# Patient Record
Sex: Male | Born: 1950
Health system: Southern US, Community
[De-identification: ages and names within clinical notes are randomized; demographics above are authoritative.]

## PROBLEM LIST (undated history)

## (undated) DIAGNOSIS — K5790 Diverticulosis of intestine, part unspecified, without perforation or abscess without bleeding: Secondary | ICD-10-CM

## (undated) DIAGNOSIS — N400 Enlarged prostate without lower urinary tract symptoms: Secondary | ICD-10-CM

## (undated) DIAGNOSIS — F419 Anxiety disorder, unspecified: Secondary | ICD-10-CM

## (undated) DIAGNOSIS — E785 Hyperlipidemia, unspecified: Secondary | ICD-10-CM

## (undated) DIAGNOSIS — I4510 Unspecified right bundle-branch block: Secondary | ICD-10-CM

## (undated) DIAGNOSIS — K429 Umbilical hernia without obstruction or gangrene: Secondary | ICD-10-CM

## (undated) DIAGNOSIS — Z789 Other specified health status: Secondary | ICD-10-CM

## (undated) DIAGNOSIS — N433 Hydrocele, unspecified: Secondary | ICD-10-CM

## (undated) DIAGNOSIS — I5020 Unspecified systolic (congestive) heart failure: Secondary | ICD-10-CM

## (undated) DIAGNOSIS — R918 Other nonspecific abnormal finding of lung field: Secondary | ICD-10-CM

## (undated) DIAGNOSIS — I700 Atherosclerosis of aorta: Secondary | ICD-10-CM

## (undated) DIAGNOSIS — I513 Intracardiac thrombosis, not elsewhere classified: Secondary | ICD-10-CM

## (undated) DIAGNOSIS — Z8619 Personal history of other infectious and parasitic diseases: Secondary | ICD-10-CM

## (undated) DIAGNOSIS — I2510 Atherosclerotic heart disease of native coronary artery without angina pectoris: Secondary | ICD-10-CM

## (undated) DIAGNOSIS — M1990 Unspecified osteoarthritis, unspecified site: Secondary | ICD-10-CM

## (undated) DIAGNOSIS — J439 Emphysema, unspecified: Secondary | ICD-10-CM

## (undated) DIAGNOSIS — I219 Acute myocardial infarction, unspecified: Secondary | ICD-10-CM

## (undated) DIAGNOSIS — I255 Ischemic cardiomyopathy: Secondary | ICD-10-CM

## (undated) DIAGNOSIS — I779 Disorder of arteries and arterioles, unspecified: Secondary | ICD-10-CM

## (undated) DIAGNOSIS — H9190 Unspecified hearing loss, unspecified ear: Secondary | ICD-10-CM

## (undated) DIAGNOSIS — I10 Essential (primary) hypertension: Secondary | ICD-10-CM

## (undated) DIAGNOSIS — D126 Benign neoplasm of colon, unspecified: Secondary | ICD-10-CM

## (undated) DIAGNOSIS — R0789 Other chest pain: Secondary | ICD-10-CM

## (undated) DIAGNOSIS — M87052 Idiopathic aseptic necrosis of left femur: Secondary | ICD-10-CM

## (undated) DIAGNOSIS — K4090 Unilateral inguinal hernia, without obstruction or gangrene, not specified as recurrent: Secondary | ICD-10-CM

## (undated) DIAGNOSIS — N529 Male erectile dysfunction, unspecified: Secondary | ICD-10-CM

## (undated) DIAGNOSIS — Z7901 Long term (current) use of anticoagulants: Secondary | ICD-10-CM

## (undated) DIAGNOSIS — F1290 Cannabis use, unspecified, uncomplicated: Secondary | ICD-10-CM

## (undated) DIAGNOSIS — K219 Gastro-esophageal reflux disease without esophagitis: Secondary | ICD-10-CM

## (undated) DIAGNOSIS — K635 Polyp of colon: Secondary | ICD-10-CM

## (undated) DIAGNOSIS — I509 Heart failure, unspecified: Secondary | ICD-10-CM

## (undated) HISTORY — DX: Atherosclerotic heart disease of native coronary artery without angina pectoris: I25.10

## (undated) HISTORY — DX: Heart failure, unspecified: I50.9

## (undated) HISTORY — DX: Gastro-esophageal reflux disease without esophagitis: K21.9

## (undated) HISTORY — DX: Hyperlipidemia, unspecified: E78.5

## (undated) HISTORY — PX: SPLENECTOMY: SUR1306

## (undated) HISTORY — DX: Ischemic cardiomyopathy: I25.5

## (undated) HISTORY — DX: Intracardiac thrombosis, not elsewhere classified: I51.3

## (undated) HISTORY — DX: Unspecified systolic (congestive) heart failure: I50.20

## (undated) HISTORY — PX: CARDIAC CATHETERIZATION: SHX172

## (undated) HISTORY — PX: HYDROCELE EXCISION: SHX482

---

## 2003-02-02 HISTORY — PX: HERNIA REPAIR: SHX51

## 2003-05-03 ENCOUNTER — Other Ambulatory Visit: Payer: Self-pay

## 2005-10-29 DIAGNOSIS — I10 Essential (primary) hypertension: Secondary | ICD-10-CM

## 2006-08-10 ENCOUNTER — Ambulatory Visit: Payer: Self-pay | Admitting: Gastroenterology

## 2006-08-10 LAB — HM COLONOSCOPY

## 2008-07-30 DIAGNOSIS — L570 Actinic keratosis: Secondary | ICD-10-CM

## 2009-08-19 ENCOUNTER — Ambulatory Visit: Payer: Self-pay | Admitting: Family Medicine

## 2009-08-26 DIAGNOSIS — M5412 Radiculopathy, cervical region: Secondary | ICD-10-CM | POA: Insufficient documentation

## 2009-09-08 ENCOUNTER — Ambulatory Visit: Payer: Self-pay | Admitting: Family Medicine

## 2012-11-23 LAB — HM HEPATITIS C SCREENING LAB: HM Hepatitis Screen: NEGATIVE

## 2014-07-30 ENCOUNTER — Other Ambulatory Visit: Payer: Self-pay

## 2014-07-30 ENCOUNTER — Encounter: Payer: Self-pay | Admitting: Family Medicine

## 2014-07-30 ENCOUNTER — Ambulatory Visit (INDEPENDENT_AMBULATORY_CARE_PROVIDER_SITE_OTHER): Payer: Commercial Managed Care - PPO | Admitting: Family Medicine

## 2014-07-30 VITALS — BP 132/82 | HR 90 | Temp 98.1°F | Resp 16 | Wt 223.2 lb

## 2014-07-30 DIAGNOSIS — E669 Obesity, unspecified: Secondary | ICD-10-CM | POA: Insufficient documentation

## 2014-07-30 DIAGNOSIS — Z658 Other specified problems related to psychosocial circumstances: Secondary | ICD-10-CM

## 2014-07-30 DIAGNOSIS — Z87891 Personal history of nicotine dependence: Secondary | ICD-10-CM | POA: Insufficient documentation

## 2014-07-30 DIAGNOSIS — M47812 Spondylosis without myelopathy or radiculopathy, cervical region: Secondary | ICD-10-CM | POA: Insufficient documentation

## 2014-07-30 DIAGNOSIS — F419 Anxiety disorder, unspecified: Secondary | ICD-10-CM | POA: Insufficient documentation

## 2014-07-30 DIAGNOSIS — R3911 Hesitancy of micturition: Secondary | ICD-10-CM | POA: Insufficient documentation

## 2014-07-30 DIAGNOSIS — N529 Male erectile dysfunction, unspecified: Secondary | ICD-10-CM | POA: Insufficient documentation

## 2014-07-30 DIAGNOSIS — F439 Reaction to severe stress, unspecified: Secondary | ICD-10-CM

## 2014-07-30 MED ORDER — LORAZEPAM 0.5 MG PO TABS: 0.5000 mg | ORAL_TABLET | Freq: Three times a day (TID) | ORAL | Status: DC | PRN

## 2014-07-30 NOTE — Progress Notes (Signed)
Subjective:     Patient ID: Edward [name], male   DOB: 1950/04/26, 64 y.o.   MRN: [id number]  HPI  Chief Complaint  Patient presents with  . Hypertension    patient states that he noticed over weekend elevation in his blood pressure. He states that he has been under tremendous stress with his mother and finances at home. Patient states that when he checked his blood pressure he was out with his mother at pharmacy b.p read 168/80s and when checked for 3rd time he reports b.p 200/100. Patient reports that his head feels funny and that his skin is more sensitive on both his arms. He had concerns of sign of stroke  Reports compliance with metoprolol. Stress triggers have included dealing with his mother and her illness, house repairs, and debt. Has been taking lorazepam only on an occasional basis. Will also smoke marijuana occasionally.   Review of Systems  Respiratory: Negative for chest tightness and shortness of breath.        Objective:   Physical Exam  Constitutional: He appears well-developed and well-nourished. Distressed: moderate anxiety.  Neck: Carotid bruit is not present.  Cardiovascular: Normal rate and regular rhythm.   Pulmonary/Chest: Breath sounds normal.       Assessment:   1Situational stress - LORazepam (ATIVAN) 0.5 MG tablet; Take 1 tablet (0.5 mg total) by mouth 3 (three) times daily as needed for anxiety.  Dispense: 60 tablet; Refill: 1    Plan:    F/u with primary provider in one week.

## 2014-07-30 NOTE — Patient Instructions (Signed)
Please schedule lorazepam every 8 hours as needed for anxiety. Would suggest you take it at least twice daily during this stressful period for you.

## 2014-08-08 ENCOUNTER — Ambulatory Visit (INDEPENDENT_AMBULATORY_CARE_PROVIDER_SITE_OTHER): Payer: Commercial Managed Care - PPO | Admitting: Family Medicine

## 2014-08-08 VITALS — BP 136/82 | HR 64 | Temp 98.5°F | Resp 16 | Wt 230.0 lb

## 2014-08-08 DIAGNOSIS — I10 Essential (primary) hypertension: Secondary | ICD-10-CM

## 2014-08-08 DIAGNOSIS — F419 Anxiety disorder, unspecified: Secondary | ICD-10-CM

## 2014-08-08 DIAGNOSIS — R3911 Hesitancy of micturition: Secondary | ICD-10-CM | POA: Diagnosis not present

## 2014-08-08 DIAGNOSIS — N401 Enlarged prostate with lower urinary tract symptoms: Secondary | ICD-10-CM | POA: Diagnosis not present

## 2014-08-08 MED ORDER — TAMSULOSIN HCL 0.4 MG PO CAPS: 0.4000 mg | ORAL_CAPSULE | Freq: Every day | ORAL | Status: DC

## 2014-08-08 NOTE — Progress Notes (Signed)
Subjective:    Patient ID: Edward [name], male    DOB: 1950-09-02, 64 y.o.   MRN: [id number] Chief Complaint  Patient presents with  . Follow-up    HPI This 64 year old male present for follow up of hypertension and situational stress causing anxiety. Situation with mother and possible memory/dementia issues is calming down. Patient has decided to take little vacations from this situation by letting her go stay with family for a week. Using Lorazepam 0.5 mg BID with Metoprolol 50 mg BID. Sleeping only 5 hours a night but catch naps in the recliner each evening. Wife has felt he has a lot of snoring and possibly sleep apnea couple years ago, but not recently. Patient Active Problem List   Diagnosis Date Noted  . Anxiety 07/30/2014  . Arthritis of neck 07/30/2014  . ED (erectile dysfunction) of organic origin 07/30/2014  . Personal history of tobacco use, presenting hazards to health 07/30/2014  . Adiposity 07/30/2014  . Delayed onset of urination 07/30/2014  . Brachial neuritis 08/26/2009  . Actinic keratoses 07/30/2008  . Essential (primary) hypertension 10/29/2005   History  Substance Use Topics  . Smoking status: Former Smoker    Start date: 07/03/2007  . Smokeless tobacco: Not on file     Comment: QUIT IN 2005  . Alcohol Use: 0.0 oz/week    0 Standard drinks or equivalent per week     Comment: OCCASIONALLY   Family History  Problem Relation Age of Onset  . Emphysema Mother   . Macular degeneration Mother   . Heart attack Mother   . Heart attack Father    Past Surgical History  Procedure Laterality Date  . Hernia repair  2005  . Hydrocele excision     Current Outpatient Prescriptions on File Prior to Visit  Medication Sig Dispense Refill  . Aspirin Buf,AlHyd-MgHyd-CaCar, (ASCRIPTIN) 325 MG TABS Take 1 tablet by mouth daily.    Marland Kitchen LORazepam (ATIVAN) 0.5 MG tablet Take 1 tablet (0.5 mg total) by mouth 3 (three) times daily as needed for anxiety. 60 tablet 1  .  metoprolol (LOPRESSOR) 50 MG tablet Take 1 tablet by mouth 2 (two) times daily.     No current facility-administered medications on file prior to visit.   No Known Allergies   Review of Systems  Constitutional:       Weight gain with over eating late at night and 4-6 beers daily.  HENT: Negative.   Cardiovascular: Negative.   Gastrointestinal:       Occasional dyspepsia - controlled by infrequent Famotidine  Genitourinary: Positive for frequency.       Urinary hesitancy  Psychiatric/Behavioral: Positive for agitation. Negative for suicidal ideas.       Intermittent, but frequent stress causing anxiety.      Wt Readings from Last 3 Encounters:  08/08/14 230 lb (104.327 kg)  07/30/14 223 lb 3.2 oz (101.243 kg)   Temp Readings from Last 3 Encounters:  08/08/14 98.5 F (36.9 C) Oral  07/30/14 98.1 F (36.7 C) Oral   BP Readings from Last 3 Encounters:  08/08/14 136/82  07/30/14 132/82   Pulse Readings from Last 3 Encounters:  08/08/14 64  07/30/14 90    Objective:   Physical Exam  Constitutional: He is oriented to person, place, and time. He appears well-developed and well-nourished. No distress.  HENT:  Head: Normocephalic and atraumatic.  Right Ear: Hearing normal.  Left Ear: Hearing normal.  Nose: Nose normal.  Eyes: Conjunctivae and  lids are normal. Right eye exhibits no discharge. Left eye exhibits no discharge. No scleral icterus.  Neck: Normal range of motion. Neck supple.  Cardiovascular: Normal rate and regular rhythm.   Pulmonary/Chest: Effort normal. No respiratory distress.  Musculoskeletal: Normal range of motion.  Neurological: He is alert and oriented to person, place, and time.  Skin: Skin is intact. No lesion and no rash noted.  Psychiatric: He has a normal mood and affect. His speech is normal and behavior is normal. Thought content normal.      Assessment & Plan:  1. Anxiety Improved with use to Lorazepam 0.5 mg BID prn. To side effects or  hangover sensation in the morning after taking it. Recommend exercise and weight loss. Taking a break from caring for mother should help decrease anxiety level. Recheck in 3 months.  2. Essential (primary) hypertension Well controlled with Metoprolol 50 mg BID. Tolerating well without side effects. Some history of snoring and wife being concerned about possible sleep apnea. Patient denies the condition and not interested in sleep study yet. Advised an ambulatory/home sleep study is now available for diagnosis. Recheck BP in 3 months.  3. Urinary hesitancy due to benign prostatic hypertrophy Having some frequency without dysuria or hematuria. Has a history of suspected BPH that the urologist treated with Flomax in the past. Would like to try it again as he felt it worked well previously. Recheck in 3 months. - tamsulosin (FLOMAX) 0.4 MG CAPS capsule; Take 1 capsule (0.4 mg total) by mouth daily.  Dispense: 30 capsule; Refill: 3

## 2014-09-20 ENCOUNTER — Telehealth: Payer: Self-pay | Admitting: Family Medicine

## 2014-09-20 NOTE — Telephone Encounter (Signed)
Pt received a denial from insurance company 07/28/14.  Pt wonders if it is the way it was coded.  He came in for high blood pressure, but ended up talking about anxiety too.  His insurance usually pays.  Can we check into this and call him back.

## 2014-10-10 ENCOUNTER — Other Ambulatory Visit: Payer: Self-pay | Admitting: Family Medicine

## 2014-10-10 NOTE — Telephone Encounter (Signed)
See request for refill

## 2014-10-14 NOTE — Telephone Encounter (Signed)
Pt contacted office for refill request on the following medications: LORazepam (ATIVAN) 0.5 MG tablet to [facility] in [place]. Pt is out of this medication and would like it sent in today if possible because he has been out of the medication. Thanks TNP

## 2014-10-15 ENCOUNTER — Other Ambulatory Visit: Payer: Self-pay | Admitting: Family Medicine

## 2014-10-17 ENCOUNTER — Other Ambulatory Visit: Payer: Self-pay | Admitting: Family Medicine

## 2014-10-18 ENCOUNTER — Other Ambulatory Visit: Payer: Self-pay | Admitting: Family Medicine

## 2014-10-25 ENCOUNTER — Other Ambulatory Visit: Payer: Self-pay | Admitting: Family Medicine

## 2014-11-20 ENCOUNTER — Other Ambulatory Visit: Payer: Self-pay | Admitting: Family Medicine

## 2014-12-10 ENCOUNTER — Ambulatory Visit (INDEPENDENT_AMBULATORY_CARE_PROVIDER_SITE_OTHER): Payer: Commercial Managed Care - PPO | Admitting: Family Medicine

## 2014-12-10 ENCOUNTER — Encounter: Payer: Self-pay | Admitting: Family Medicine

## 2014-12-10 VITALS — BP 136/78 | HR 55 | Temp 98.3°F | Resp 16 | Ht 70.25 in | Wt 211.8 lb

## 2014-12-10 DIAGNOSIS — I10 Essential (primary) hypertension: Secondary | ICD-10-CM

## 2014-12-10 DIAGNOSIS — F419 Anxiety disorder, unspecified: Secondary | ICD-10-CM

## 2014-12-10 DIAGNOSIS — Z0000 Encounter for general adult medical examination without abnormal findings: Secondary | ICD-10-CM

## 2014-12-10 LAB — POCT URINALYSIS DIPSTICK
BILIRUBIN UA: NEGATIVE
Glucose, UA: NEGATIVE
KETONES UA: NEGATIVE
Leukocytes, UA: NEGATIVE
NITRITE UA: NEGATIVE
PH UA: 6
PROTEIN UA: NEGATIVE
RBC UA: NEGATIVE
Spec Grav, UA: 1.02
Urobilinogen, UA: 0.2

## 2014-12-10 NOTE — Patient Instructions (Signed)
Generalized Anxiety Disorder Generalized anxiety disorder (GAD) is a mental disorder. It interferes with life functions, including relationships, work, and school. GAD is different from normal anxiety, which everyone experiences at some point in their lives in response to specific life events and activities. Normal anxiety actually helps [place] prepare for and get through these life events and activities. Normal anxiety goes away after the event or activity is over.  GAD causes anxiety that is not necessarily related to specific events or activities. It also causes excess anxiety in proportion to specific events or activities. The anxiety associated with GAD is also difficult to control. GAD can vary from mild to severe. People with severe GAD can have intense waves of anxiety with physical symptoms (panic attacks).  SYMPTOMS The anxiety and worry associated with GAD are difficult to control. This anxiety and worry are related to many life events and activities and also occur more days than not for 6 months or longer. People with GAD also have three or more of the following symptoms (one or more in children):  Restlessness.   Fatigue.  Difficulty concentrating.   Irritability.  Muscle tension.  Difficulty sleeping or unsatisfying sleep. DIAGNOSIS GAD is diagnosed through an assessment by your health care provider. Your health care provider will ask you questions aboutyour mood,physical symptoms, and events in your life. Your health care provider may ask you about your medical history and use of alcohol or drugs, including prescription medicines. Your health care provider may also do a physical exam and blood tests. Certain medical conditions and the use of certain substances can cause symptoms similar to those associated with GAD. Your health care provider may refer you to a mental health specialist for further evaluation. TREATMENT The following therapies are usually used to treat GAD:    Medication. Antidepressant medication usually is prescribed for long-term daily control. Antianxiety medicines may be added in severe cases, especially when panic attacks occur.   Talk therapy (psychotherapy). Certain types of talk therapy can be helpful in treating GAD by providing support, education, and guidance. A form of talk therapy called cognitive behavioral therapy can teach you healthy ways to think about and react to daily life events and activities.  Stress managementtechniques. These include yoga, meditation, and exercise and can be very helpful when they are practiced regularly. A mental health specialist can help determine which treatment is best for you. Some people see improvement with one therapy. However, other people require a combination of therapies.   This information is not intended to replace advice given to you by your health care provider. Make sure you discuss any questions you have with your health care provider.   Document Released: 05/15/2012 Document Revised: 02/08/2014 Document Reviewed: 05/15/2012 Elsevier Interactive Patient Education 2016 [place] Anxiety Disorder Social anxiety disorder, previously called social phobia, is a mental disorder. People with social anxiety disorder frequently feel nervous, afraid, or embarrassed when around other people in social situations. They constantly worry that other people are judging or criticizing them for how they look, what they say, or how they act. They may worry that other people might reject them because of their appearance or behavior. Social anxiety disorder is more than just occasional shyness or self-consciousness. It can cause severe emotional distress. It can interfere with daily life activities. Social anxiety disorder also may lead to excessive alcohol or drug use and even suicide.  Social anxiety disorder is actually one of the most common mental disorders. It can develop at  any time but  usually starts in the teenage years. Women are more commonly affected than men. Social anxiety disorder is also more common in people who have family members with anxiety disorders. It also is more common in people who have physical deformities or conditions with characteristics that are obvious to others, such as stuttered speech or movement abnormalities (Parkinson disease).  SYMPTOMS  In addition to feeling anxious or fearful in social situations, people with social anxiety disorder frequently have physical symptoms. Examples include:  Red face (blushing).  Racing heart.  Sweating.  Shaky hands or voice.  Confusion.  Light-headedness.  Upset stomach and diarrhea. DIAGNOSIS  Social anxiety disorder is diagnosed through an assessment by your health care provider. Your health care provider will ask you questions about your mood, thoughts, and reactions in social situations. Your health care provider may ask you about your medical history and use of alcohol or drugs, including prescription medicines. Certain medical conditions and the use of certain substances, including caffeine, can cause symptoms similar to social anxiety disorder. Your health care provider may refer you to a mental health specialist for further evaluation or treatment. The criteria for diagnosis of social anxiety disorder are:  Marked fear or anxiety in one or more social situations in which you may be closely watched or studied by others. Examples of such situations include:  Interacting socially (having a conversation with others, going to a party, or meeting strangers).  Being observed (eating or drinking in public or being called on in class).  Performing in front of others (giving a speech).  The social situations of concern almost always cause fear or anxiety, not just occasionally.  People with social anxiety disorder fear that they will be viewed negatively in a way that will be embarrassing, will lead to  rejection, or will offend others. This fear is out of proportion to the actual threat posed by the social situation.  Often the triggering social situations are avoided, or they are endured with intense fear or anxiety. The fear, anxiety, or avoidance is persistent and lasts for 6 months or longer.  The anxiety causes difficulty functioning in at least some parts of your daily life. TREATMENT  Several types of treatment are available for social anxiety disorder. These treatments are often used in combination and include:   Talk therapy. Group talk therapy allows you to see that you are not alone with these problems. Individual talk therapy helps you address your specific anxiety issues with a caring professional. The most effective forms of talk therapy for social anxiety disorder are cognitive-behavioral therapy and exposure therapy. Cognitive-behavioral therapy helps you to identify and change negative thoughts and beliefs that are at the root of the disorder. Exposure therapy allows you to gradually face the situations that you fear most.  Relaxation and coping techniques. These include deep breathing, self-talk, meditation, visual imagery, and yoga. Relaxation techniques help to keep you calm in social situations.  Social [employer] secretary.Social skills can be learned on your own or with the help of a talk therapist. They can help you feel more confident and comfortable in social situations.  Medicine. For anxiety limited to performance situations (performance anxiety), medicine called beta blockers can help by reducing or preventing the physical symptoms of social anxiety disorder. For more persistent and generalized social anxiety, antidepressant medicine may be prescribed to help control symptoms. In severe cases of social anxiety disorder, strong antianxiety medicine, called benzodiazepines, may be prescribed on a limited basis and for  a short time.   This information is not intended to  replace advice given to you by your health care provider. Make sure you discuss any questions you have with your health care provider.   Document Released: 12/17/2004 Document Revised: 02/08/2014 Document Reviewed: 04/18/2012 Elsevier Interactive Patient Education 2016 [place] Maintenance, Male A healthy lifestyle and preventative care can promote health and wellness.  Maintain regular health, dental, and eye exams.  Eat a healthy diet. Foods like vegetables, fruits, whole grains, low-fat dairy products, and lean protein foods contain the nutrients you need and are low in calories. Decrease your intake of foods high in solid fats, added sugars, and salt. Get information about a proper diet from your health care provider, if necessary.  Regular physical exercise is one of the most important things you can do for your health. Most adults should get at least 150 minutes of moderate-intensity exercise (any activity that increases your heart rate and causes you to sweat) each week. In addition, most adults need muscle-strengthening exercises on 2 or more days a week.   Maintain a healthy weight. The body mass index (BMI) is a screening tool to identify possible weight problems. It provides an estimate of body fat based on height and weight. Your health care provider can find your BMI and can help you achieve or maintain a healthy weight. For males 20 years and older:  A BMI below 18.5 is considered underweight.  A BMI of 18.5 to 24.9 is normal.  A BMI of 25 to 29.9 is considered overweight.  A BMI of 30 and above is considered obese.  Maintain normal blood lipids and cholesterol by exercising and minimizing your intake of saturated fat. Eat a balanced diet with plenty of fruits and vegetables. Blood tests for lipids and cholesterol should begin at age 71 and be repeated every 5 years. If your lipid or cholesterol levels are high, you are over age 68, or you are at high risk for heart  disease, you may need your cholesterol levels checked more frequently.Ongoing high lipid and cholesterol levels should be treated with medicines if diet and exercise are not working.  If you smoke, find out from your health care provider how to quit. If you do not use tobacco, do not start.  Lung cancer screening is recommended for adults aged 67-80 years who are at high risk for developing lung cancer because of a history of smoking. A yearly low-dose CT scan of the lungs is recommended for people who have at least a 30-pack-year history of smoking and are current smokers or have quit within the past 15 years. A pack year of smoking is smoking an average of 1 pack of cigarettes a day for 1 year (for example, a 30-pack-year history of smoking could mean smoking 1 pack a day for 30 years or 2 packs a day for 15 years). Yearly screening should continue until the smoker has stopped smoking for at least 15 years. Yearly screening should be stopped for people who develop a health problem that would prevent them from having lung cancer treatment.  If you choose to drink alcohol, do not have more than 2 drinks per day. One drink is considered to be 12 oz (360 mL) of beer, 5 oz (150 mL) of wine, or 1.5 oz (45 mL) of liquor.  Avoid the use of street drugs. Do not share needles with anyone. Ask for help if you need support or instructions about stopping the use of  drugs.  High blood pressure causes heart disease and increases the risk of stroke. High blood pressure is more likely to develop in:  People who have blood pressure in the end of the normal range (100-139/85-89 mm Hg).  People who are overweight or obese.  People who are African American.  If you are 60-71 years of age, have your blood pressure checked every 3-5 years. If you are 85 years of age or older, have your blood pressure checked every year. You should have your blood pressure measured twice--once when you are at a hospital or clinic, and  once when you are not at a hospital or clinic. Record the average of the two measurements. To check your blood pressure when you are not at a hospital or clinic, you can use:  An automated blood pressure machine at a pharmacy.  A home blood pressure monitor.  If you are 13-5 years old, ask your health care provider if you should take aspirin to prevent heart disease.  Diabetes screening involves taking a blood sample to check your fasting blood sugar level. This should be done once every 3 years after age 68 if you are at a normal weight and without risk factors for diabetes. Testing should be considered at a younger age or be carried out more frequently if you are overweight and have at least 1 risk factor for diabetes.  Colorectal cancer can be detected and often prevented. Most routine colorectal cancer screening begins at the age of 28 and continues through age 37. However, your health care provider may recommend screening at an earlier age if you have risk factors for colon cancer. On a yearly basis, your health care provider may provide home test kits to check for hidden blood in the stool. A small camera at the end of a tube may be used to directly examine the colon (sigmoidoscopy or colonoscopy) to detect the earliest forms of colorectal cancer. Talk to your health care provider about this at age 65 when routine screening begins. A direct exam of the colon should be repeated every 5-10 years through age 70, unless early forms of precancerous polyps or small growths are found.  People who are at an increased risk for hepatitis B should be screened for this virus. You are considered at high risk for hepatitis B if:  You were born in a country where hepatitis B occurs often. Talk with your health care provider about which countries are considered high risk.  Your parents were born in a high-risk country and you have not received a shot to protect against hepatitis B (hepatitis B  vaccine).  You have HIV or AIDS.  You use needles to inject street drugs.  You live with, or have sex with, someone who has hepatitis B.  You are a man who has sex with other men (MSM).  You get hemodialysis treatment.  You take certain medicines for conditions like cancer, organ transplantation, and autoimmune conditions.  Hepatitis C blood testing is recommended for all people born from [birth year] through 1965 and any individual with known risk factors for hepatitis C.  Healthy men should no longer receive prostate-specific antigen (PSA) blood tests as part of routine cancer screening. Talk to your health care provider about prostate cancer screening.  Testicular cancer screening is not recommended for adolescents or adult males who have no symptoms. Screening includes self-exam, a health care provider exam, and other screening tests. Consult with your health care provider about any symptoms you have  or any concerns you have about testicular cancer.  Practice safe sex. Use condoms and avoid high-risk sexual practices to reduce the spread of sexually transmitted infections (STIs).  You should be screened for STIs, including gonorrhea and chlamydia if:  You are sexually active and are younger than 24 years.  You are older than 24 years, and your health care provider tells you that you are at risk for this type of infection.  Your sexual activity has changed since you were last screened, and you are at an increased risk for chlamydia or gonorrhea. Ask your health care provider if you are at risk.  If you are at risk of being infected with HIV, it is recommended that you take a prescription medicine daily to prevent HIV infection. This is called pre-exposure prophylaxis (PrEP). You are considered at risk if:  You are a man who has sex with other men (MSM).  You are a heterosexual man who is sexually active with multiple partners.  You take drugs by injection.  You are sexually active  with a partner who has HIV.  Talk with your health care provider about whether you are at high risk of being infected with HIV. If you choose to begin PrEP, you should first be tested for HIV. You should then be tested every 3 months for as long as you are taking PrEP.  Use sunscreen. Apply sunscreen liberally and repeatedly throughout the day. You should seek shade when your shadow is shorter than you. Protect yourself by wearing long sleeves, pants, a wide-brimmed hat, and sunglasses year round whenever you are outdoors.  Tell your health care provider of new moles or changes in moles, especially if there is a change in shape or color. Also, tell your health care provider if a mole is larger than the size of a pencil eraser.  A one-time screening for abdominal aortic aneurysm (AAA) and surgical repair of large AAAs by ultrasound is recommended for men aged 75-75 years who are current or former smokers.  Stay current with your vaccines (immunizations).   This information is not intended to replace advice given to you by your health care provider. Make sure you discuss any questions you have with your health care provider.   Document Released: 07/17/2007 Document Revised: 02/08/2014 Document Reviewed: 06/15/2010 Elsevier Interactive Patient Education [employer].

## 2014-12-10 NOTE — Progress Notes (Signed)
Patient ID: [name], male   DOB: Nov 10, 1950, 64 y.o.   MRN: [id number]       Patient: Edward [name], Male    DOB: [birth year], 64 y.o.   MRN: [id number] Visit Date: 12/10/2014  Today's Provider: Vernie Murders, PA   Chief Complaint  Patient presents with  . Annual Exam   Subjective:    Annual physical exam Edward [name] is a 64 y.o. male who presents today for health maintenance and complete physical. He feels well. He reports exercising none. He reports he is sleeping 5-6 hours per night.  -----------------------------------------------------------------  Review of Systems  Constitutional: Negative.   HENT: Negative.   Eyes: Negative.   Respiratory: Negative.   Cardiovascular: Negative.   Gastrointestinal: Negative.   Endocrine: Negative.   Genitourinary: Negative.   Musculoskeletal: Negative.   Skin: Negative.   Allergic/Immunologic: Negative.   Neurological: Negative.   Hematological: Negative.   Psychiatric/Behavioral: Negative.     Social History He  reports that he has quit smoking. He started smoking about 7 years ago. He does not have any smokeless tobacco history on file. He reports that he drinks alcohol. He reports that he does not use illicit drugs. Social History   Social History  . Marital Status: Married    Spouse Name: N/A  . Number of Children: N/A  . Years of Education: N/A   Social History Main Topics  . Smoking status: Former Smoker    Start date: 07/03/2007  . Smokeless tobacco: None     Comment: QUIT IN 2005  . Alcohol Use: 0.0 oz/week    0 Standard drinks or equivalent per week     Comment: OCCASIONALLY  . Drug Use: No  . Sexual Activity: Not Asked   Other Topics Concern  . None   Social History Narrative    Patient Active Problem List   Diagnosis Date Noted  . Anxiety 07/30/2014  . Arthritis of neck ([place]) 07/30/2014  . ED (erectile dysfunction) of organic origin 07/30/2014  . Personal history of tobacco use, presenting  hazards to health 07/30/2014  . Adiposity 07/30/2014  . Delayed onset of urination 07/30/2014  . Brachial neuritis 08/26/2009  . Actinic keratoses 07/30/2008  . Essential (primary) hypertension 10/29/2005    Past Surgical History  Procedure Laterality Date  . Hernia repair  2005  . Hydrocele excision      Family History  Family Status  Relation Status Death Age  . Mother Alive   . Father Deceased 64  . Maternal Grandmother Deceased   . Maternal Grandfather Deceased   . Paternal Grandmother Deceased   . Paternal Grandfather Deceased    His family history includes Emphysema in his mother; Heart attack in his father and mother; Macular degeneration in his mother.    No Known Allergies  Previous Medications   ASPIRIN BUF,ALHYD-MGHYD-CACAR, (ASCRIPTIN) 325 MG TABS    Take 1 tablet by mouth daily.   LORAZEPAM (ATIVAN) 0.5 MG TABLET    TAKE 1 TABLET BY MOUTH 3 TIMES A DAY AS NEEDED FOR ANXIETY   METOPROLOL (LOPRESSOR) 50 MG TABLET    1 TABLET, ORAL, TWO TIMES DAILY BY MOUTH   TAMSULOSIN (FLOMAX) 0.4 MG CAPS CAPSULE    Take 1 capsule (0.4 mg total) by mouth daily.    Patient Care Team: Margo Common, PA as PCP - General (Physician Assistant)     Objective:   Vitals: BP 136/78 mmHg  Pulse 55  Temp(Src) 98.3 F (36.8 C) (  Oral)  Resp 16  Ht 5' 10.25" (1.784 m)  Wt 211 lb 12.8 oz (96.072 kg)  BMI 30.19 kg/m2  SpO2 96%  Wt Readings from Last 3 Encounters:  12/10/14 211 lb 12.8 oz (96.072 kg)  08/08/14 230 lb (104.327 kg)  07/30/14 223 lb 3.2 oz (101.243 kg)    Physical Exam  Constitutional: He is oriented to person, place, and time. He appears well-developed and well-nourished.  HENT:  Head: Normocephalic and atraumatic.  Right Ear: External ear normal.  Left Ear: External ear normal.  Nose: Nose normal.  Mouth/Throat: Oropharynx is clear and moist.  Eyes: Conjunctivae and EOM are normal. Pupils are equal, round, and reactive to light. Right eye exhibits no  discharge.  Neck: Normal range of motion. Neck supple. No tracheal deviation present. No thyromegaly present.  Cardiovascular: Normal rate, regular rhythm, normal heart sounds and intact distal pulses.   No murmur heard. Pulmonary/Chest: Effort normal and breath sounds normal. No respiratory distress. He has no wheezes. He has no rales. He exhibits no tenderness.  Chest asymmetry unchanged.  Abdominal: Soft. He exhibits no distension and no mass. There is no tenderness. There is no rebound and no guarding.  Genitourinary: Rectum normal, prostate normal and penis normal. Guaiac negative stool.  Musculoskeletal: Normal range of motion. He exhibits no edema or tenderness.  Lymphadenopathy:    He has no cervical adenopathy.  Neurological: He is alert and oriented to person, place, and time. He has normal reflexes. No cranial nerve deficit. He exhibits normal muscle tone. Coordination normal.  Skin: Skin is warm and dry. No rash noted. No erythema.  Psychiatric: He has a normal mood and affect. His behavior is normal. Judgment and thought content normal.    Depression Screen Feeling well without signs of depression. No suicidal ideation. Intermittent stress and some anxiety occasionally.   Assessment & Plan:     Routine Health Maintenance and Physical Exam  Exercise Activities and Dietary recommendations Goals    Recommend regular exercise for 30 minutes 3-4 times a week.      Immunization History  Administered Date(s) Administered  . Tdap 11/23/2012    Health Maintenance  Topic Date Due  . Hepatitis C Screening  1951/01/08  . HIV Screening  12/16/1966  . ZOSTAVAX  12/16/2011  . INFLUENZA VACCINE  05/02/2015 (Originally 09/02/2014)  . COLONOSCOPY  08/09/2016  . TETANUS/TDAP  11/24/2022      Discussed health benefits of physical activity, and encouraged him to engage in regular exercise appropriate for his age and condition.      --------------------------------------------------------------------  1. Annual physical exam Good general health. Normal screening colonoscopy 08-10-06 by Dr. Allen Norris. Discussed pneumonia, flu and shingles vaccination - declines. Will get routine labs and follow up pending reports. - POCT urinalysis dipstick  2. Essential (primary) hypertension Stable with good control on the Metoprolol 50 mg BID. Will get routine labs and continue present dosage.  - CBC with Differential/Platelet - COMPLETE METABOLIC PANEL WITH GFR - Lipid panel - TSH  3. Anxiety Has not been taking the Lorazepam recently and feels control is good. Relationship with mother is tolerable now. Recheck prn.

## 2014-12-11 LAB — CBC WITH DIFFERENTIAL/PLATELET
BASOS ABS: 0 10*3/uL (ref 0.0–0.2)
Basos: 1 %
EOS (ABSOLUTE): 0.1 10*3/uL (ref 0.0–0.4)
EOS: 2 %
HEMATOCRIT: 43.4 % (ref 37.5–51.0)
Hemoglobin: 14.6 g/dL (ref 12.6–17.7)
IMMATURE GRANULOCYTES: 0 %
Immature Grans (Abs): 0 10*3/uL (ref 0.0–0.1)
LYMPHS ABS: 1.8 10*3/uL (ref 0.7–3.1)
Lymphs: 31 %
MCH: 31.7 pg (ref 26.6–33.0)
MCHC: 33.6 g/dL (ref 31.5–35.7)
MCV: 94 fL (ref 79–97)
MONOS ABS: 0.5 10*3/uL (ref 0.1–0.9)
Monocytes: 8 %
NEUTROS PCT: 58 %
Neutrophils Absolute: 3.5 10*3/uL (ref 1.4–7.0)
PLATELETS: 205 10*3/uL (ref 150–379)
RBC: 4.61 x10E6/uL (ref 4.14–5.80)
RDW: 13.5 % (ref 12.3–15.4)
WBC: 5.9 10*3/uL (ref 3.4–10.8)

## 2014-12-11 LAB — LIPID PANEL
CHOL/HDL RATIO: 4 ratio (ref 0.0–5.0)
CHOLESTEROL TOTAL: 162 mg/dL (ref 100–199)
HDL: 41 mg/dL (ref >39)
LDL Calculated: 99 mg/dL (ref 0–99)
TRIGLYCERIDES: 111 mg/dL (ref 0–149)
VLDL Cholesterol Cal: 22 mg/dL (ref 5–40)

## 2014-12-11 LAB — TSH: TSH: 3.08 u[IU]/mL (ref 0.450–4.500)

## 2015-02-12 ENCOUNTER — Other Ambulatory Visit: Payer: Self-pay | Admitting: Family Medicine

## 2015-02-14 ENCOUNTER — Telehealth: Payer: Self-pay | Admitting: Family Medicine

## 2015-02-14 DIAGNOSIS — R3911 Hesitancy of micturition: Principal | ICD-10-CM

## 2015-02-14 DIAGNOSIS — N401 Enlarged prostate with lower urinary tract symptoms: Secondary | ICD-10-CM

## 2015-02-14 MED ORDER — TAMSULOSIN HCL 0.4 MG PO CAPS: 0.4000 mg | ORAL_CAPSULE | Freq: Every day | ORAL | Status: DC

## 2015-02-14 NOTE — Telephone Encounter (Signed)
Re-ordered and sent to [facility].

## 2015-02-14 NOTE — Telephone Encounter (Signed)
Kaylah calling from [facility] stating pt needs a refill on his tamsulosin (FLOMAX) 0.4 MG CAPS capsule.  CB# [phone]  Thanks, CC

## 2015-06-26 ENCOUNTER — Other Ambulatory Visit: Payer: Self-pay | Admitting: Family Medicine

## 2015-08-07 ENCOUNTER — Ambulatory Visit (INDEPENDENT_AMBULATORY_CARE_PROVIDER_SITE_OTHER): Payer: Commercial Managed Care - PPO | Admitting: Family Medicine

## 2015-08-07 ENCOUNTER — Encounter: Payer: Self-pay | Admitting: Family Medicine

## 2015-08-07 VITALS — BP 132/68 | HR 58 | Temp 98.0°F | Resp 16 | Wt 213.0 lb

## 2015-08-07 DIAGNOSIS — J029 Acute pharyngitis, unspecified: Secondary | ICD-10-CM

## 2015-08-07 DIAGNOSIS — K219 Gastro-esophageal reflux disease without esophagitis: Secondary | ICD-10-CM | POA: Diagnosis not present

## 2015-08-07 LAB — POCT RAPID STREP A (OFFICE): Rapid Strep A Screen: NEGATIVE

## 2015-08-07 MED ORDER — FAMOTIDINE 20 MG PO TABS: 20.0000 mg | ORAL_TABLET | Freq: Two times a day (BID) | ORAL | Status: DC

## 2015-08-07 NOTE — Progress Notes (Signed)
Patient: Edward [name] Male    DOB: May 13, 1950   65 y.o.   MRN: [id number] Visit Date: 08/07/2015  Today's Provider: Vernie Murders, PA   Chief Complaint  Patient presents with  . Sore Throat    X 3 days.    Subjective:    Sore Throat  This is a new problem. The current episode started in the past 7 days. The problem has been unchanged. Neither side of throat is experiencing more pain than the other. There has been no fever. The pain is mild. Associated symptoms include congestion, ear pain, neck pain and trouble swallowing. He has had no exposure to strep. He has tried acetaminophen for the symptoms. The treatment provided mild relief.   Patient Active Problem List   Diagnosis Date Noted  . Anxiety 07/30/2014  . Arthritis of neck ([place]) 07/30/2014  . ED (erectile dysfunction) of organic origin 07/30/2014  . Personal history of tobacco use, presenting hazards to health 07/30/2014  . Adiposity 07/30/2014  . Delayed onset of urination 07/30/2014  . Brachial neuritis 08/26/2009  . Actinic keratoses 07/30/2008  . Essential (primary) hypertension 10/29/2005   Past Surgical History  Procedure Laterality Date  . Hernia repair  2005  . Hydrocele excision     Family History  Problem Relation Age of Onset  . Emphysema Mother   . Macular degeneration Mother   . Heart attack Mother   . Heart attack Father    No Known Allergies   Current Meds  Medication Sig  . Aspirin Buf,AlHyd-MgHyd-CaCar, (ASCRIPTIN) 325 MG TABS Take 1 tablet by mouth daily.  . metoprolol (LOPRESSOR) 50 MG tablet 1 TABLET, ORAL, TWO TIMES DAILY BY MOUTH  . tamsulosin (FLOMAX) 0.4 MG CAPS capsule Take 1 capsule (0.4 mg total) by mouth daily.    Review of Systems  HENT: Positive for congestion, ear pain and trouble swallowing.   Gastrointestinal: Negative for blood in stool.       Occasional dyspepsia  Musculoskeletal: Positive for neck pain.    Social History  Substance Use Topics  . Smoking  status: Former Smoker    Start date: 07/03/2007  . Smokeless tobacco: Not on file     Comment: QUIT IN 2005  . Alcohol Use: 0.0 oz/week    0 Standard drinks or equivalent per week     Comment: OCCASIONALLY   Objective:   BP 132/68 mmHg  Pulse 58  Temp(Src) 98 F (36.7 C)  Resp 16  Wt 213 lb (96.616 kg) Body mass index is 30.36 kg/(m^2).  Wt Readings from Last 3 Encounters:  08/07/15 213 lb (96.616 kg)  12/10/14 211 lb 12.8 oz (96.072 kg)  08/08/14 230 lb (104.327 kg)    Physical Exam  Constitutional: He is oriented to person, place, and time. He appears well-developed and well-nourished. No distress.  HENT:  Head: Normocephalic and atraumatic.  Right Ear: Hearing normal.  Left Ear: Hearing normal.  Nose: Nose normal.  Mild redness to posterior pharynx. No exudates.  Eyes: Conjunctivae and lids are normal. Right eye exhibits no discharge. Left eye exhibits no discharge. No scleral icterus.  Neck: Neck supple. No thyromegaly present.  Cardiovascular: Normal rate and regular rhythm.   Pulmonary/Chest: Effort normal and breath sounds normal. No respiratory distress.  Abdominal: Soft. Bowel sounds are normal.  Musculoskeletal: Normal range of motion.  Lymphadenopathy:    He has no cervical adenopathy.  Neurological: He is alert and oriented to person, place, and time.  Skin: Skin is intact. No lesion and no rash noted.  Psychiatric: He has a normal mood and affect. His speech is normal and behavior is normal. Thought content normal.      Assessment & Plan:     1. Pharyngitis Onset over the past week. Slight relief from antihistamine. Strep test negative. No fever but slight PND. May continue antihistamine and saltwater gargles. May use Netti-Pot irrigation for sinus congestion and recheck if needed.  2. Gastroesophageal reflux disease, esophagitis presence not specified Occasional burning in throat and heartburn. Should limit caffeine and beer consumption with greasy spicy  foods. Restart Pepcid BID and recheck prn. - famotidine (PEPCID) 20 MG tablet; Take 1 tablet (20 mg total) by mouth 2 (two) times daily.  Dispense: 30 tablet; Refill: [place], [facility]

## 2015-08-07 NOTE — Patient Instructions (Signed)

## 2015-08-29 ENCOUNTER — Telehealth: Payer: Self-pay | Admitting: Family Medicine

## 2015-08-29 NOTE — Telephone Encounter (Signed)
Pt was bit by cat last night, he soaked in peroxide and took a pencillin antibiotic pill.  He would like to know what to do and if he needs to have something called in for him .

## 2015-08-29 NOTE — Telephone Encounter (Signed)
Please advised. Last Tetanus vaccine (Tdap) was 11/23/2012.

## 2015-08-29 NOTE — Telephone Encounter (Signed)
Left a message to call the office if any signs of infection. Should schedule appointment to have a look at the cat bite. If concerned about rabies, will need to call the health department for advise.

## 2015-12-16 ENCOUNTER — Encounter: Payer: Self-pay | Admitting: Family Medicine

## 2015-12-16 ENCOUNTER — Ambulatory Visit (INDEPENDENT_AMBULATORY_CARE_PROVIDER_SITE_OTHER): Payer: Commercial Managed Care - PPO | Admitting: Family Medicine

## 2015-12-16 VITALS — BP 122/86 | HR 56 | Temp 97.8°F | Resp 14 | Ht 69.25 in | Wt 215.4 lb

## 2015-12-16 DIAGNOSIS — Z114 Encounter for screening for human immunodeficiency virus [HIV]: Secondary | ICD-10-CM | POA: Diagnosis not present

## 2015-12-16 DIAGNOSIS — Z1211 Encounter for screening for malignant neoplasm of colon: Secondary | ICD-10-CM | POA: Diagnosis not present

## 2015-12-16 DIAGNOSIS — Z125 Encounter for screening for malignant neoplasm of prostate: Secondary | ICD-10-CM

## 2015-12-16 DIAGNOSIS — M4692 Unspecified inflammatory spondylopathy, cervical region: Secondary | ICD-10-CM

## 2015-12-16 DIAGNOSIS — K644 Residual hemorrhoidal skin tags: Secondary | ICD-10-CM | POA: Diagnosis not present

## 2015-12-16 DIAGNOSIS — F419 Anxiety disorder, unspecified: Secondary | ICD-10-CM | POA: Diagnosis not present

## 2015-12-16 DIAGNOSIS — M47812 Spondylosis without myelopathy or radiculopathy, cervical region: Secondary | ICD-10-CM

## 2015-12-16 DIAGNOSIS — I10 Essential (primary) hypertension: Secondary | ICD-10-CM | POA: Diagnosis not present

## 2015-12-16 DIAGNOSIS — Z0000 Encounter for general adult medical examination without abnormal findings: Secondary | ICD-10-CM

## 2015-12-16 LAB — IFOBT (OCCULT BLOOD): IMMUNOLOGICAL FECAL OCCULT BLOOD TEST: NEGATIVE

## 2015-12-16 LAB — FECAL OCCULT BLOOD, GUAIAC: FECAL OCCULT BLD: NEGATIVE

## 2015-12-16 NOTE — Progress Notes (Signed)
Patient: Edward [name], Male    DOB: Oct 13, 1950, 65 y.o.   MRN: [id number] Visit Date: 12/16/2015  Today's Provider: Vernie Murders, PA   Chief Complaint  Patient presents with  . Annual Exam   Subjective:    Annual physical exam Edward [name] is a 65 y.o. male who presents today for health maintenance and complete physical. He feels well. He reports exercising none. He reports he is sleeping average 5 hours per night.  -----------------------------------------------------------------   Review of Systems  Constitutional: Negative.   HENT: Negative.   Eyes: Negative.   Respiratory: Negative.   Cardiovascular: Negative.   Gastrointestinal: Negative.   Endocrine: Negative.   Genitourinary: Negative.   Musculoskeletal: Negative.   Skin: Negative.   Allergic/Immunologic: Negative.   Neurological: Negative.   Psychiatric/Behavioral: Negative.     Social History      He  reports that he has quit smoking. He started smoking about 8 years ago. He does not have any smokeless tobacco history on file. He reports that he drinks alcohol. He reports that he does not use drugs.       Social History   Social History  . Marital status: Married    Spouse name: N/A  . Number of children: N/A  . Years of education: N/A   Social History Main Topics  . Smoking status: Former Smoker    Start date: 07/03/2007  . Smokeless tobacco: None     Comment: QUIT IN 2005  . Alcohol use 0.0 oz/week     Comment: OCCASIONALLY  . Drug use: No  . Sexual activity: Not Asked   Other Topics Concern  . None   Social History Narrative  . None    No past medical history on file.   Patient Active Problem List   Diagnosis Date Noted  . Anxiety 07/30/2014  . Arthritis of neck ([place]) 07/30/2014  . ED (erectile dysfunction) of organic origin 07/30/2014  . Personal history of tobacco use, presenting hazards to health 07/30/2014  . Adiposity 07/30/2014  . Delayed onset of urination 07/30/2014   . Brachial neuritis 08/26/2009  . Actinic keratoses 07/30/2008  . Essential (primary) hypertension 10/29/2005    Past Surgical History:  Procedure Laterality Date  . HERNIA REPAIR  2005  . HYDROCELE EXCISION      Family History        Family Status  Relation Status  . Mother Alive  . Father Deceased at age 79  . Maternal Grandmother Deceased  . Maternal Grandfather Deceased  . Paternal Grandmother Deceased  . Paternal Grandfather Deceased        His family history includes Emphysema in his mother; Heart attack in his father and mother; Macular degeneration in his mother.     No Known Allergies   Current Outpatient Prescriptions:  .  Aspirin Buf,AlHyd-MgHyd-CaCar, (ASCRIPTIN) 325 MG TABS, Take 1 tablet by mouth daily., Disp: , Rfl:  .  famotidine (PEPCID) 20 MG tablet, Take 1 tablet (20 mg total) by mouth 2 (two) times daily., Disp: 30 tablet, Rfl: 3 .  LORazepam (ATIVAN) 0.5 MG tablet, TAKE 1 TABLET BY MOUTH 3 TIMES A DAY AS NEEDED FOR ANXIETY, Disp: 60 tablet, Rfl: 1 .  metoprolol (LOPRESSOR) 50 MG tablet, 1 TABLET, ORAL, TWO TIMES DAILY BY MOUTH, Disp: 60 tablet, Rfl: 6 .  tamsulosin (FLOMAX) 0.4 MG CAPS capsule, Take 1 capsule (0.4 mg total) by mouth daily., Disp: 30 capsule, Rfl: 3   Patient  Care Team: Margo Common, PA as PCP - General (Physician Assistant)      Objective:   Vitals: BP 122/86 (BP Location: Right Arm, Patient Position: Sitting, Cuff Size: Normal)   Pulse (!) 56   Temp 97.8 F (36.6 C) (Oral)   Resp 14   Ht 5' 9.25" (1.759 m)   Wt 215 lb 6.4 oz (97.7 kg)   BMI 31.58 kg/m   Wt Readings from Last 3 Encounters:  12/16/15 215 lb 6.4 oz (97.7 kg)  08/07/15 213 lb (96.6 kg)  12/10/14 211 lb 12.8 oz (96.1 kg)    Physical Exam  Constitutional: He is oriented to person, place, and time. He appears well-developed and well-nourished.  HENT:  Head: Normocephalic and atraumatic.  Right Ear: External ear normal.  Left Ear: External ear normal.    Nose: Nose normal.  Mouth/Throat: Oropharynx is clear and moist.  Eyes: Conjunctivae and EOM are normal. Pupils are equal, round, and reactive to light. Right eye exhibits no discharge.  Neck: Normal range of motion. Neck supple. No tracheal deviation present. No thyromegaly present.  Cardiovascular: Normal rate, regular rhythm, normal heart sounds and intact distal pulses.   No murmur heard. Pulmonary/Chest: Effort normal and breath sounds normal. No respiratory distress. He has no wheezes. He has no rales. He exhibits no tenderness.  Prominent ribs and congenital deformity.  Abdominal: Soft. He exhibits no distension and no mass. There is no tenderness. There is no rebound and no guarding.  Genitourinary: Prostate normal and penis normal.  Genitourinary Comments: External large skin tag from past thrombosed hemorrhoid. No pain or bleeding today.  Musculoskeletal: Normal range of motion. He exhibits no edema or tenderness.  Lymphadenopathy:    He has no cervical adenopathy.  Neurological: He is alert and oriented to person, place, and time. He has normal reflexes. No cranial nerve deficit. He exhibits normal muscle tone. Coordination normal.  Skin: Skin is warm and dry. No rash noted. No erythema.  Psychiatric: His behavior is normal. Judgment and thought content normal. His mood appears anxious.   Depression Screen PHQ 2/9 Scores 12/16/2015  PHQ - 2 Score 0    Assessment & Plan:     Routine Health Maintenance and Physical Exam  Exercise Activities and Dietary recommendations Goals    Recommend exercise 3-4 days a week for 30 minutes. Recommend low fat and calorie restricted diet to lose weight.      Immunization History  Administered Date(s) Administered  . Tdap 11/23/2012    Health Maintenance  Topic Date Due  . HIV Screening  12/16/1966  . ZOSTAVAX  12/16/2011  . INFLUENZA VACCINE  12/15/2016 (Originally 09/02/2015)  . COLONOSCOPY  08/09/2016  . TETANUS/TDAP   11/24/2022  . Hepatitis C Screening  Completed     Discussed health benefits of physical activity, and encouraged him to engage in regular exercise appropriate for his age and condition.    -------------------------------------------------------------------- 1. Annual physical exam General health stable. Last colonoscopy was normal on 08-10-06. Declines flu shot. Last Tdap was 11-23-12. Will consider Zoster vaccination and Prevnar next year.  2. Essential (primary) hypertension Stable and well controlled with Metoprolol 50 mg BID. Recheck routine labs, limit salt in diet and restrict beer consumption. Recheck pending lab reports. - CBC with Differential/Platelet - Comprehensive metabolic panel - Lipid panel - TSH  3. Arthritis of neck ([facility]) Intermittent ache and stiff sensation in neck. Ascriptin some help. Recommend moist heat applications and recheck prn. May need recheck of C-spine  x-rays to assess progress. - CBC with Differential/Platelet  4. Anxiety Intermittent episodes with conflicts with maternal relationship. Uses Lorazepam 0.5 mg occasionally if needed. Will recheck routine labs. - Comprehensive metabolic panel - TSH  5. External hemorrhoids Large skin tags from past hemorrhoid thrombosis. Recommend increase fluids, extra fiber in diet and use of stool softener to control diarrhea. May use Anusol-HC prn irritation.  6. Screening PSA (prostate specific antigen) - PSA  7. Screening for HIV (human immunodeficiency virus) - HIV antibody    Vernie Murders, PA  [facility]

## 2015-12-17 LAB — CBC WITH DIFFERENTIAL/PLATELET
Basophils Absolute: 0 10*3/uL (ref 0.0–0.2)
Basos: 0 %
EOS (ABSOLUTE): 0.1 10*3/uL (ref 0.0–0.4)
EOS: 3 %
HEMATOCRIT: 44.6 % (ref 37.5–51.0)
HEMOGLOBIN: 15.1 g/dL (ref 12.6–17.7)
IMMATURE GRANS (ABS): 0 10*3/uL (ref 0.0–0.1)
IMMATURE GRANULOCYTES: 0 %
LYMPHS: 29 %
Lymphocytes Absolute: 1.5 10*3/uL (ref 0.7–3.1)
MCH: 31.9 pg (ref 26.6–33.0)
MCHC: 33.9 g/dL (ref 31.5–35.7)
MCV: 94 fL (ref 79–97)
MONOCYTES: 10 %
Monocytes Absolute: 0.5 10*3/uL (ref 0.1–0.9)
NEUTROS PCT: 58 %
Neutrophils Absolute: 3 10*3/uL (ref 1.4–7.0)
Platelets: 206 10*3/uL (ref 150–379)
RBC: 4.73 x10E6/uL (ref 4.14–5.80)
RDW: 14.1 % (ref 12.3–15.4)
WBC: 5.2 10*3/uL (ref 3.4–10.8)

## 2015-12-17 LAB — COMPREHENSIVE METABOLIC PANEL
ALBUMIN: 4.5 g/dL (ref 3.6–4.8)
ALT: 37 IU/L (ref 0–44)
AST: 20 IU/L (ref 0–40)
Albumin/Globulin Ratio: 1.9 (ref 1.2–2.2)
Alkaline Phosphatase: 49 IU/L (ref 39–117)
BUN/Creatinine Ratio: 24 (ref 10–24)
BUN: 20 mg/dL (ref 8–27)
Bilirubin Total: 0.6 mg/dL (ref 0.0–1.2)
CALCIUM: 9 mg/dL (ref 8.6–10.2)
CO2: 24 mmol/L (ref 18–29)
CREATININE: 0.83 mg/dL (ref 0.76–1.27)
Chloride: 105 mmol/L (ref 96–106)
GFR calc Af Amer: 107 mL/min/{1.73_m2} (ref >59)
GFR, EST NON AFRICAN AMERICAN: 93 mL/min/{1.73_m2} (ref >59)
GLOBULIN, TOTAL: 2.4 g/dL (ref 1.5–4.5)
Glucose: 98 mg/dL (ref 65–99)
Potassium: 4.7 mmol/L (ref 3.5–5.2)
SODIUM: 142 mmol/L (ref 134–144)
TOTAL PROTEIN: 6.9 g/dL (ref 6.0–8.5)

## 2015-12-17 LAB — LIPID PANEL
CHOLESTEROL TOTAL: 179 mg/dL (ref 100–199)
Chol/HDL Ratio: 4.4 ratio units (ref 0.0–5.0)
HDL: 41 mg/dL (ref >39)
LDL CALC: 120 mg/dL — AB (ref 0–99)
TRIGLYCERIDES: 88 mg/dL (ref 0–149)
VLDL CHOLESTEROL CAL: 18 mg/dL (ref 5–40)

## 2015-12-17 LAB — TSH: TSH: 2.01 u[IU]/mL (ref 0.450–4.500)

## 2015-12-17 LAB — PSA: PROSTATE SPECIFIC AG, SERUM: 0.7 ng/mL (ref 0.0–4.0)

## 2015-12-17 LAB — HIV ANTIBODY (ROUTINE TESTING W REFLEX): HIV Screen 4th Generation wRfx: NONREACTIVE

## 2015-12-19 ENCOUNTER — Telehealth: Payer: Self-pay

## 2015-12-19 NOTE — Telephone Encounter (Signed)
[id number] 

## 2015-12-19 NOTE — Telephone Encounter (Signed)
-----   Message from Margo Common, [place] sent at 12/19/2015  3:33 PM EST ----- All blood tests normal except LDL cholesterol a little up. Lower fats in diet and walk for exercise. May use Krill Oil one daily to help get this back in line. Recheck levels in 6 months.

## 2015-12-22 NOTE — Telephone Encounter (Signed)
Patient has been advised. KW 

## 2016-02-29 ENCOUNTER — Other Ambulatory Visit: Payer: Self-pay | Admitting: Family Medicine

## 2016-03-04 DIAGNOSIS — I513 Intracardiac thrombosis, not elsewhere classified: Secondary | ICD-10-CM

## 2016-03-04 HISTORY — DX: Intracardiac thrombosis, not elsewhere classified: I51.3

## 2016-03-09 ENCOUNTER — Encounter: Payer: Self-pay | Admitting: Internal Medicine

## 2016-03-09 ENCOUNTER — Telehealth: Payer: Self-pay

## 2016-03-09 ENCOUNTER — Inpatient Hospital Stay
Admission: EM | Admit: 2016-03-09 | Discharge: 2016-03-19 | DRG: 247 | Disposition: A | Discharge status: Home / Self Care | Payer: Commercial Managed Care - PPO | Attending: Internal Medicine | Admitting: Internal Medicine

## 2016-03-09 ENCOUNTER — Ambulatory Visit (INDEPENDENT_AMBULATORY_CARE_PROVIDER_SITE_OTHER): Payer: Commercial Managed Care - PPO | Admitting: Family Medicine

## 2016-03-09 ENCOUNTER — Encounter: Payer: Self-pay | Admitting: Family Medicine

## 2016-03-09 ENCOUNTER — Encounter
Admission: EM | Disposition: A | Discharge status: Home / Self Care | Payer: Self-pay | Source: Home / Self Care | Attending: Internal Medicine

## 2016-03-09 VITALS — BP 162/108 | HR 57 | Temp 97.9°F | Resp 14 | Wt 221.8 lb

## 2016-03-09 DIAGNOSIS — I2102 ST elevation (STEMI) myocardial infarction involving left anterior descending coronary artery: Principal | ICD-10-CM | POA: Diagnosis present

## 2016-03-09 DIAGNOSIS — Z8249 Family history of ischemic heart disease and other diseases of the circulatory system: Secondary | ICD-10-CM

## 2016-03-09 DIAGNOSIS — Z7982 Long term (current) use of aspirin: Secondary | ICD-10-CM

## 2016-03-09 DIAGNOSIS — A0472 Enterocolitis due to Clostridium difficile, not specified as recurrent: Secondary | ICD-10-CM | POA: Diagnosis present

## 2016-03-09 DIAGNOSIS — Z7189 Other specified counseling: Secondary | ICD-10-CM | POA: Diagnosis not present

## 2016-03-09 DIAGNOSIS — A044 Other intestinal Escherichia coli infections: Secondary | ICD-10-CM | POA: Diagnosis present

## 2016-03-09 DIAGNOSIS — E785 Hyperlipidemia, unspecified: Secondary | ICD-10-CM

## 2016-03-09 DIAGNOSIS — F419 Anxiety disorder, unspecified: Secondary | ICD-10-CM | POA: Diagnosis present

## 2016-03-09 DIAGNOSIS — Z79899 Other long term (current) drug therapy: Secondary | ICD-10-CM

## 2016-03-09 DIAGNOSIS — R079 Chest pain, unspecified: Secondary | ICD-10-CM | POA: Diagnosis present

## 2016-03-09 DIAGNOSIS — Z825 Family history of asthma and other chronic lower respiratory diseases: Secondary | ICD-10-CM | POA: Diagnosis not present

## 2016-03-09 DIAGNOSIS — F17200 Nicotine dependence, unspecified, uncomplicated: Secondary | ICD-10-CM | POA: Diagnosis not present

## 2016-03-09 DIAGNOSIS — I236 Thrombosis of atrium, auricular appendage, and ventricle as current complications following acute myocardial infarction: Secondary | ICD-10-CM | POA: Diagnosis not present

## 2016-03-09 DIAGNOSIS — R072 Precordial pain: Secondary | ICD-10-CM

## 2016-03-09 DIAGNOSIS — E876 Hypokalemia: Secondary | ICD-10-CM | POA: Diagnosis present

## 2016-03-09 DIAGNOSIS — N400 Enlarged prostate without lower urinary tract symptoms: Secondary | ICD-10-CM | POA: Diagnosis present

## 2016-03-09 DIAGNOSIS — K210 Gastro-esophageal reflux disease with esophagitis: Secondary | ICD-10-CM | POA: Diagnosis present

## 2016-03-09 DIAGNOSIS — I959 Hypotension, unspecified: Secondary | ICD-10-CM | POA: Diagnosis present

## 2016-03-09 DIAGNOSIS — I2109 ST elevation (STEMI) myocardial infarction involving other coronary artery of anterior wall: Secondary | ICD-10-CM

## 2016-03-09 DIAGNOSIS — Z87891 Personal history of nicotine dependence: Secondary | ICD-10-CM

## 2016-03-09 DIAGNOSIS — I213 ST elevation (STEMI) myocardial infarction of unspecified site: Secondary | ICD-10-CM | POA: Diagnosis not present

## 2016-03-09 DIAGNOSIS — I2510 Atherosclerotic heart disease of native coronary artery without angina pectoris: Secondary | ICD-10-CM | POA: Diagnosis present

## 2016-03-09 DIAGNOSIS — I255 Ischemic cardiomyopathy: Secondary | ICD-10-CM

## 2016-03-09 DIAGNOSIS — I10 Essential (primary) hypertension: Secondary | ICD-10-CM

## 2016-03-09 DIAGNOSIS — E782 Mixed hyperlipidemia: Secondary | ICD-10-CM

## 2016-03-09 DIAGNOSIS — I2129 ST elevation (STEMI) myocardial infarction involving other sites: Secondary | ICD-10-CM | POA: Diagnosis not present

## 2016-03-09 HISTORY — DX: Benign prostatic hyperplasia without lower urinary tract symptoms: N40.0

## 2016-03-09 HISTORY — PX: CORONARY STENT INTERVENTION: CATH118234

## 2016-03-09 HISTORY — DX: ST elevation (STEMI) myocardial infarction involving other coronary artery of anterior wall: I21.09

## 2016-03-09 HISTORY — DX: Anxiety disorder, unspecified: F41.9

## 2016-03-09 HISTORY — DX: Essential (primary) hypertension: I10

## 2016-03-09 HISTORY — PX: CORONARY ANGIOGRAPHY: CATH118303

## 2016-03-09 LAB — DIFFERENTIAL
Basophils Absolute: 0 10*3/uL (ref 0–0.1)
Basophils Relative: 0 %
EOS PCT: 1 %
Eosinophils Absolute: 0.1 10*3/uL (ref 0–0.7)
LYMPHS ABS: 1.6 10*3/uL (ref 1.0–3.6)
LYMPHS PCT: 15 %
Monocytes Absolute: 0.8 10*3/uL (ref 0.2–1.0)
Monocytes Relative: 7 %
NEUTROS PCT: 77 %
Neutro Abs: 8 10*3/uL — ABNORMAL HIGH (ref 1.4–6.5)

## 2016-03-09 LAB — CBC
HCT: 45.8 % (ref 40.0–52.0)
HEMOGLOBIN: 15.3 g/dL (ref 13.0–18.0)
MCH: 32 pg (ref 26.0–34.0)
MCHC: 33.5 g/dL (ref 32.0–36.0)
MCV: 95.5 fL (ref 80.0–100.0)
Platelets: 195 10*3/uL (ref 150–440)
RBC: 4.8 MIL/uL (ref 4.40–5.90)
RDW: 13.5 % (ref 11.5–14.5)
WBC: 10.5 10*3/uL (ref 3.8–10.6)

## 2016-03-09 LAB — POCT ACTIVATED CLOTTING TIME
ACTIVATED CLOTTING TIME: 246 s
ACTIVATED CLOTTING TIME: 252 s
ACTIVATED CLOTTING TIME: 257 s
Activated Clotting Time: 301 seconds
Activated Clotting Time: 235 seconds

## 2016-03-09 LAB — COMPREHENSIVE METABOLIC PANEL
ALBUMIN: 4.5 g/dL (ref 3.5–5.0)
ALT: 52 U/L (ref 17–63)
AST: 127 U/L — AB (ref 15–41)
Alkaline Phosphatase: 45 U/L (ref 38–126)
Anion gap: 6 (ref 5–15)
BILIRUBIN TOTAL: 0.7 mg/dL (ref 0.3–1.2)
BUN: 16 mg/dL (ref 6–20)
CHLORIDE: 105 mmol/L (ref 101–111)
CO2: 31 mmol/L (ref 22–32)
Calcium: 9.2 mg/dL (ref 8.9–10.3)
Creatinine, Ser: 0.92 mg/dL (ref 0.61–1.24)
GFR calc Af Amer: >60 mL/min (ref >60)
GFR calc non Af Amer: >60 mL/min (ref >60)
GLUCOSE: 123 mg/dL — AB (ref 65–99)
POTASSIUM: 4.7 mmol/L (ref 3.5–5.1)
Sodium: 142 mmol/L (ref 135–145)
Total Protein: 7.5 g/dL (ref 6.5–8.1)

## 2016-03-09 LAB — LIPID PANEL
CHOL/HDL RATIO: 3.8 ratio
Cholesterol: 184 mg/dL (ref 0–200)
Cholesterol: 187 mg/dL (ref 0–200)
HDL: 48 mg/dL (ref >40)
HDL: 47 mg/dL (ref >40)
LDL CALC: 114 mg/dL — AB (ref 0–99)
LDL CALC: 124 mg/dL — AB (ref 0–99)
TRIGLYCERIDES: 79 mg/dL (ref <150)
Total CHOL/HDL Ratio: 4 RATIO
Triglycerides: 111 mg/dL (ref <150)
VLDL: 22 mg/dL (ref 0–40)
VLDL: 16 mg/dL (ref 0–40)

## 2016-03-09 LAB — TROPONIN I
Troponin I: 12.03 ng/mL (ref <0.03)
Troponin I: >65 ng/mL (ref <0.03)

## 2016-03-09 LAB — PROTIME-INR
INR: 0.93
Prothrombin Time: 12.5 seconds (ref 11.4–15.2)

## 2016-03-09 LAB — GLUCOSE, CAPILLARY: GLUCOSE-CAPILLARY: 78 mg/dL (ref 65–99)

## 2016-03-09 LAB — APTT: aPTT: 30 seconds (ref 24–36)

## 2016-03-09 SURGERY — CORONARY STENT INTERVENTION
Anesthesia: Moderate Sedation

## 2016-03-09 MED ORDER — ONDANSETRON HCL 4 MG/2ML IJ SOLN: 4.0000 mg | Freq: Four times a day (QID) | INTRAMUSCULAR | Status: DC | PRN

## 2016-03-09 MED ORDER — CARVEDILOL 3.125 MG PO TABS
3.1250 mg | ORAL_TABLET | Freq: Two times a day (BID) | ORAL | Status: DC
  Administered 2016-03-09 – 2016-03-19 (×18): 3.125 mg via ORAL
  Filled 2016-03-09 (×18): qty 1

## 2016-03-09 MED ORDER — HEPARIN SODIUM (PORCINE) 1000 UNIT/ML IJ SOLN
INTRAMUSCULAR | Status: AC
  Filled 2016-03-09: qty 1

## 2016-03-09 MED ORDER — TIROFIBAN HCL IN NACL 5-0.9 MG/100ML-% IV SOLN
0.1500 ug/kg/min | INTRAVENOUS | Status: AC
  Administered 2016-03-09: 0.15 ug/kg/min via INTRAVENOUS

## 2016-03-09 MED ORDER — MIDAZOLAM HCL 2 MG/2ML IJ SOLN
INTRAMUSCULAR | Status: AC
  Filled 2016-03-09: qty 2

## 2016-03-09 MED ORDER — HYDRALAZINE HCL 20 MG/ML IJ SOLN: 5.0000 mg | INTRAMUSCULAR | Status: AC | PRN

## 2016-03-09 MED ORDER — SODIUM CHLORIDE 0.9% FLUSH
3.0000 mL | INTRAVENOUS | Status: DC | PRN
  Administered 2016-03-10 – 2016-03-16 (×2): 3 mL via INTRAVENOUS
  Filled 2016-03-09 (×2): qty 3

## 2016-03-09 MED ORDER — HEPARIN SODIUM (PORCINE) 5000 UNIT/ML IJ SOLN
4000.0000 [IU] | INTRAMUSCULAR | Status: AC
  Administered 2016-03-09: 4000 [IU] via INTRAVENOUS

## 2016-03-09 MED ORDER — ACETAMINOPHEN 325 MG PO TABS: 650.0000 mg | ORAL_TABLET | ORAL | Status: DC | PRN

## 2016-03-09 MED ORDER — TIROFIBAN HCL IN NACL 5-0.9 MG/100ML-% IV SOLN
INTRAVENOUS | Status: AC
  Filled 2016-03-09: qty 100

## 2016-03-09 MED ORDER — FENTANYL CITRATE (PF) 100 MCG/2ML IJ SOLN
INTRAMUSCULAR | Status: AC
  Filled 2016-03-09: qty 2

## 2016-03-09 MED ORDER — TICAGRELOR 90 MG PO TABS
90.0000 mg | ORAL_TABLET | Freq: Two times a day (BID) | ORAL | Status: DC
  Administered 2016-03-10 (×2): 90 mg via ORAL
  Filled 2016-03-09 (×2): qty 1

## 2016-03-09 MED ORDER — ATORVASTATIN CALCIUM 20 MG PO TABS
80.0000 mg | ORAL_TABLET | Freq: Every day | ORAL | Status: DC
  Administered 2016-03-09 – 2016-03-18 (×10): 80 mg via ORAL
  Filled 2016-03-09 (×8): qty 4
  Filled 2016-03-09: qty 1
  Filled 2016-03-09 (×2): qty 4

## 2016-03-09 MED ORDER — HEPARIN SODIUM (PORCINE) 1000 UNIT/ML IJ SOLN
INTRAMUSCULAR | Status: DC | PRN
  Administered 2016-03-09 (×2): 2000 [IU] via INTRAVENOUS
  Administered 2016-03-09: 5000 [IU] via INTRAVENOUS
  Administered 2016-03-09: 2000 [IU] via INTRAVENOUS

## 2016-03-09 MED ORDER — ASPIRIN 81 MG PO CHEW
81.0000 mg | CHEWABLE_TABLET | Freq: Every day | ORAL | Status: DC
  Administered 2016-03-10 – 2016-03-19 (×10): 81 mg via ORAL
  Filled 2016-03-09 (×10): qty 1

## 2016-03-09 MED ORDER — NITROGLYCERIN 5 MG/ML IV SOLN
INTRAVENOUS | Status: AC
  Filled 2016-03-09: qty 10

## 2016-03-09 MED ORDER — TIROFIBAN (AGGRASTAT) BOLUS VIA INFUSION
INTRAVENOUS | Status: DC | PRN
  Administered 2016-03-09: 2515 ug via INTRAVENOUS

## 2016-03-09 MED ORDER — SODIUM CHLORIDE 0.9 % IV SOLN
10.0000 mL/h | INTRAVENOUS | Status: DC
  Administered 2016-03-09: 1000 mL/h via INTRAVENOUS

## 2016-03-09 MED ORDER — MIDAZOLAM HCL 2 MG/2ML IJ SOLN
INTRAMUSCULAR | Status: DC | PRN
  Administered 2016-03-09: 1 mg via INTRAVENOUS

## 2016-03-09 MED ORDER — FENTANYL CITRATE (PF) 100 MCG/2ML IJ SOLN
INTRAMUSCULAR | Status: DC | PRN
  Administered 2016-03-09: 50 ug via INTRAVENOUS

## 2016-03-09 MED ORDER — VERAPAMIL HCL 2.5 MG/ML IV SOLN
INTRAVENOUS | Status: DC | PRN
  Administered 2016-03-09: 2.5 mg via INTRA_ARTERIAL

## 2016-03-09 MED ORDER — FAMOTIDINE 20 MG PO TABS
20.0000 mg | ORAL_TABLET | Freq: Two times a day (BID) | ORAL | Status: DC
  Administered 2016-03-10 – 2016-03-19 (×18): 20 mg via ORAL
  Filled 2016-03-09 (×19): qty 1

## 2016-03-09 MED ORDER — SODIUM CHLORIDE 0.9% FLUSH
3.0000 mL | Freq: Two times a day (BID) | INTRAVENOUS | Status: DC
  Administered 2016-03-09 – 2016-03-19 (×21): 3 mL via INTRAVENOUS

## 2016-03-09 MED ORDER — ACETAMINOPHEN 325 MG PO TABS: 650.0000 mg | ORAL_TABLET | Freq: Four times a day (QID) | ORAL | Status: DC | PRN

## 2016-03-09 MED ORDER — NITROGLYCERIN 1 MG/10 ML FOR IR/CATH LAB
INTRA_ARTERIAL | Status: DC | PRN
  Administered 2016-03-09 (×2): 200 ug via INTRACORONARY

## 2016-03-09 MED ORDER — TIROFIBAN HCL IN NACL 5-0.9 MG/100ML-% IV SOLN
INTRAVENOUS | Status: DC | PRN
  Administered 2016-03-09 (×2): 0.15 ug/kg/min via INTRAVENOUS

## 2016-03-09 MED ORDER — ONDANSETRON HCL 4 MG PO TABS: 4.0000 mg | ORAL_TABLET | Freq: Four times a day (QID) | ORAL | Status: DC | PRN

## 2016-03-09 MED ORDER — ASPIRIN 81 MG PO CHEW: 324.0000 mg | CHEWABLE_TABLET | Freq: Once | ORAL | Status: DC

## 2016-03-09 MED ORDER — HEPARIN (PORCINE) IN NACL 2-0.9 UNIT/ML-% IJ SOLN
INTRAMUSCULAR | Status: AC
  Filled 2016-03-09: qty 500

## 2016-03-09 MED ORDER — SODIUM CHLORIDE 0.9 % IV SOLN: INTRAVENOUS | Status: DC

## 2016-03-09 MED ORDER — ACETAMINOPHEN 650 MG RE SUPP: 650.0000 mg | Freq: Four times a day (QID) | RECTAL | Status: DC | PRN

## 2016-03-09 MED ORDER — TICAGRELOR 90 MG PO TABS
ORAL_TABLET | ORAL | Status: AC
  Filled 2016-03-09: qty 2

## 2016-03-09 MED ORDER — ENOXAPARIN SODIUM 40 MG/0.4ML ~~LOC~~ SOLN
40.0000 mg | SUBCUTANEOUS | Status: DC
  Administered 2016-03-10: 40 mg via SUBCUTANEOUS
  Filled 2016-03-09: qty 0.4

## 2016-03-09 MED ORDER — TICAGRELOR 90 MG PO TABS
ORAL_TABLET | ORAL | Status: DC | PRN
  Administered 2016-03-09: 180 mg via ORAL

## 2016-03-09 MED ORDER — SODIUM CHLORIDE 0.9 % IV SOLN: 250.0000 mL | INTRAVENOUS | Status: DC | PRN

## 2016-03-09 MED ORDER — VERAPAMIL HCL 2.5 MG/ML IV SOLN
INTRAVENOUS | Status: AC
  Filled 2016-03-09: qty 2

## 2016-03-09 MED ORDER — IOPAMIDOL (ISOVUE-300) INJECTION 61%
INTRAVENOUS | Status: DC | PRN
  Administered 2016-03-09: 240 mL via INTRA_ARTERIAL

## 2016-03-09 MED ORDER — SODIUM CHLORIDE 0.9 % IV SOLN
INTRAVENOUS | Status: AC
  Administered 2016-03-09: 18:00:00 via INTRAVENOUS

## 2016-03-09 SURGICAL SUPPLY — 23 items
BALLN TREK OTW 2X8 (BALLOONS) ×3
BALLN TREK RX 2.5X15 (BALLOONS) ×3
BALLN ~~LOC~~ TREK RX 3.5X15 (BALLOONS) ×3
BALLN ~~LOC~~ TREK RX 4.0X12 (BALLOONS) ×3
BALLOON TREK OTW 2X8 (BALLOONS) ×1 IMPLANT
BALLOON TREK RX 2.5X15 (BALLOONS) ×1 IMPLANT
BALLOON ~~LOC~~ TREK RX 3.5X15 (BALLOONS) ×1 IMPLANT
BALLOON ~~LOC~~ TREK RX 4.0X12 (BALLOONS) ×1 IMPLANT
CATH 5FR JR4 DIAGNOSTIC (CATHETERS) ×3 IMPLANT
CATH EAGLE EYE PLAT IMAGING (CATHETERS) ×3 IMPLANT
CATH PRIORITY ONE AC 6F (CATHETERS) ×3 IMPLANT
CATH VISTA GUIDE 6FR XB3.5 (CATHETERS) ×3 IMPLANT
DEVICE INFLAT 30 PLUS (MISCELLANEOUS) ×3 IMPLANT
DEVICE RAD TR BAND REGULAR (VASCULAR PRODUCTS) ×3 IMPLANT
GLIDESHEATH SLEND SS 6F .021 (SHEATH) ×3 IMPLANT
KIT MANI 3VAL PERCEP (MISCELLANEOUS) ×3 IMPLANT
PACK CARDIAC CATH (CUSTOM PROCEDURE TRAY) ×3 IMPLANT
STENT RESOLUTE INTEG 3.0X38 (Permanent Stent) ×3 IMPLANT
WIRE ASAHI FIELDER XT 300CM (WIRE) ×3 IMPLANT
WIRE HITORQ VERSACORE ST 145CM (WIRE) ×3 IMPLANT
WIRE ROSEN-J .035X260CM (WIRE) ×3 IMPLANT
WIRE RUNTHROUGH .014X180CM (WIRE) ×3 IMPLANT
WIRE RUNTHROUGH .014X300CM (WIRE) ×3 IMPLANT

## 2016-03-09 NOTE — H&P (Signed)
[facility] [place] NAME: Edward [name]    MR#:  [id number]  DATE OF BIRTH:  10-Jun-1950  DATE OF ADMISSION:  03/09/2016  PRIMARY CARE PHYSICIAN: Vernie Murders, PA   REQUESTING/REFERRING PHYSICIAN: Dr. Harrell Gave End  CHIEF COMPLAINT:  No chief complaint on file.   HISTORY OF PRESENT ILLNESS:  Edward [name]  is a 66 y.o. male with a known history of Anxiety, BPH, hypertension presents from home secondary to onset of chest pain and difficulty breathing last night. Patient's complaint started about a month ago when he had severe chest pain, however he did not get any medical attention and spontaneously resolved. Last night he had chest discomfort again, initially thought indigestion. However it kept him all night long couldn't take it anymore and presented to the emergency room. Noted to have ST elevation in anterior leads however Q waves were noted as well. STEMI protocol was followed, patient went to the cardiac Cath Lab and had drug eluting stent placed to Roxanol LAD. He also had chronic total occlusion of diagonal 1 and nonobstructive CAD of RCA and left circumflex. He still has minimal chest discomfort. Being admitted to ICU post PCI. BP is slightly elevated.  PAST MEDICAL HISTORY:   Past Medical History:  Diagnosis Date  . Anxiety   . BPH (benign prostatic hyperplasia)   . Hypertension     PAST SURGICAL HISTORY:   Past Surgical History:  Procedure Laterality Date  . HERNIA REPAIR  2005  . HYDROCELE EXCISION      SOCIAL HISTORY:   Social History  Substance Use Topics  . Smoking status: Former Smoker    Start date: 07/03/2007  . Smokeless tobacco: Never Used     Comment: QUIT IN 2005  . Alcohol use 0.0 oz/week     Comment: 3-4 beers every other day    FAMILY HISTORY:   Family History  Problem Relation Age of Onset  . Emphysema Mother   . Macular degeneration Mother   . Heart attack Mother   . Heart attack Father      DRUG ALLERGIES:  No Known Allergies  REVIEW OF SYSTEMS:   Review of Systems  Constitutional: Negative for chills, fever, malaise/fatigue and weight loss.  HENT: Negative for ear discharge, ear pain, hearing loss, nosebleeds and tinnitus.   Eyes: Negative for blurred vision, double vision and photophobia.  Respiratory: Positive for shortness of breath. Negative for cough, hemoptysis and wheezing.   Cardiovascular: Positive for chest pain. Negative for palpitations, orthopnea and leg swelling.  Gastrointestinal: Negative for abdominal pain, constipation, diarrhea, heartburn, melena, nausea and vomiting.  Genitourinary: Negative for dysuria, frequency, hematuria and urgency.  Musculoskeletal: Negative for back pain, myalgias and neck pain.  Skin: Negative for rash.  Neurological: Negative for dizziness, tingling, tremors, sensory change, speech change, focal weakness and headaches.  Endo/Heme/Allergies: Does not bruise/bleed easily.  Psychiatric/Behavioral: Negative for depression.    MEDICATIONS AT HOME:   Prior to Admission medications   Medication Sig Start Date End Date Taking? Authorizing Provider  Aspirin Buf,AlHyd-MgHyd-CaCar, (ASCRIPTIN) 325 MG TABS Take 1 tablet by mouth daily.   Yes Historical Provider, MD  metoprolol (LOPRESSOR) 50 MG tablet TAKE 1 TABLET BY MOUTH TWICE A DAY 03/01/16  Yes Dennis E Chrismon, PA  famotidine (PEPCID) 20 MG tablet Take 1 tablet (20 mg total) by mouth 2 (two) times daily. 08/07/15   Vickki Muff Chrismon, PA  LORazepam (ATIVAN) 0.5 MG tablet TAKE 1 TABLET BY  MOUTH 3 TIMES A DAY AS NEEDED FOR ANXIETY Patient not taking: Reported on 03/09/2016 10/14/14   Vickki Muff Chrismon, PA  tamsulosin (FLOMAX) 0.4 MG CAPS capsule Take 1 capsule (0.4 mg total) by mouth daily. Patient not taking: Reported on 03/09/2016 02/14/15   Vickki Muff Chrismon, PA      VITAL SIGNS:  SpO2 95 %.  PHYSICAL EXAMINATION:   Physical Exam  GENERAL:  66 y.o.-year-old patient lying in  the bed with no acute distress.  EYES: Pupils equal, round, reactive to light and accommodation. No scleral icterus. Extraocular muscles intact.  HEENT: Head atraumatic, normocephalic. Oropharynx and nasopharynx clear.  NECK:  Supple, no jugular venous distention. No thyroid enlargement, no tenderness.  LUNGS: Normal breath sounds bilaterally, no wheezing, rales,rhonchi or crepitation. No use of accessory muscles of respiration.  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 S1, S2 normal. No murmurs, rubs, or gallops.  ABDOMEN: Soft, nontender, nondistended. Bowel sounds present. No organomegaly or mass.  EXTREMITIES: No pedal edema, cyanosis, or clubbing.  NEUROLOGIC: Cranial nerves II through XII are intact. Muscle strength 5/5 in all extremities. Sensation intact. Gait not checked.  PSYCHIATRIC: The patient is alert and oriented x 3.  SKIN: No obvious rash, lesion, or ulcer.   LABORATORY PANEL:   CBC  Recent Labs Lab 03/09/16 1331  WBC 10.5  HGB 15.3  HCT 45.8  PLT 195   ------------------------------------------------------------------------------------------------------------------  Chemistries   Recent Labs Lab 03/09/16 1331  NA 142  K 4.7  CL 105  CO2 31  GLUCOSE 123*  BUN 16  CREATININE 0.92  CALCIUM 9.2  AST 127*  ALT 52  ALKPHOS 45  BILITOT 0.7   ------------------------------------------------------------------------------------------------------------------  Cardiac Enzymes  Recent Labs Lab 03/09/16 1331  TROPONINI 12.03*   ------------------------------------------------------------------------------------------------------------------  RADIOLOGY:  No results found.  EKG:   Orders placed or performed during the hospital encounter of 03/09/16  . EKG 12-Lead  . EKG 12-Lead  . ED EKG  . ED EKG  . EKG 12-Lead immediately post procedure  . EKG 12-Lead  . EKG 12-Lead immediately post procedure     IMPRESSION AND PLAN:   Edward [name]  is a 66 y.o. male with a known history of Anxiety, BPH, hypertension presents from home secondary to onset of chest pain and difficulty breathing last night.  #1 STEMI- appreciate cardiology consult, s/p cardiac catheterization and LAD drug eluting stent placed - Monitor in ICU, on asa, Brilinta, started on coreg and statin - will need ACEI or ARB prior to discharge - ECHO ordered to check EF - prn nitro - neuro checks in the first 24hrs - any thrombus dislodgement and causing TIA/CVA  #2 Hypertension- coreg for now. ACEI/ARB likely in the next 24hrs  #3 Hyperlipidemia- statin  #4 DVT prophylaxis- Lovenox    All the records are reviewed and case discussed with ED provider. Management plans discussed with the patient, family and they are in agreement.  CODE STATUS: Full Code  TOTAL TIME TAKING CARE OF THIS PATIENT: 50 minutes.    Srihan Brutus M.D on 03/09/2016 at 5:15 PM  Between 7am to 6pm - Pager - [phone]  After 6pm go to [email or web address] - Proofreader  [facility]  Office  [phone]  CC: Primary care physician; Vernie Murders, PA

## 2016-03-09 NOTE — Progress Notes (Signed)
Patient: Edward [name] Male    DOB: 1950-10-07   66 y.o.   MRN: [id number] Visit Date: 03/09/2016  Today's Provider: Vernie Murders, PA   Chief Complaint  Patient presents with  . Chest Pain   Subjective:    Chest Pain   This is a recurrent problem. Episode onset: first episode 2 weeks ago, this episode started yesterday. The onset quality is sudden. The problem occurs intermittently. The pain is present in the substernal region. The quality of the pain is described as pressure. Pertinent negatives include no shortness of breath. Associated symptoms comments: Heart burn, belching, and heavy feeling arms. Risk factors include obesity and male gender (hypertension).  His past medical history is significant for anxiety/panic attacks and hypertension.   No past medical history on file. Patient Active Problem List   Diagnosis Date Noted  . Anxiety 07/30/2014  . Arthritis of neck ([place]) 07/30/2014  . ED (erectile dysfunction) of organic origin 07/30/2014  . Personal history of tobacco use, presenting hazards to health 07/30/2014  . Adiposity 07/30/2014  . Delayed onset of urination 07/30/2014  . Brachial neuritis 08/26/2009  . Actinic keratoses 07/30/2008  . Essential (primary) hypertension 10/29/2005   Past Surgical History:  Procedure Laterality Date  . HERNIA REPAIR  2005  . HYDROCELE EXCISION     Family History  Problem Relation Age of Onset  . Emphysema Mother   . Macular degeneration Mother   . Heart attack Mother   . Heart attack Father    No Known Allergies   Previous Medications   ASPIRIN BUF,ALHYD-MGHYD-CACAR, (ASCRIPTIN) 325 MG TABS    Take 1 tablet by mouth daily.   FAMOTIDINE (PEPCID) 20 MG TABLET    Take 1 tablet (20 mg total) by mouth 2 (two) times daily.   LORAZEPAM (ATIVAN) 0.5 MG TABLET    TAKE 1 TABLET BY MOUTH 3 TIMES A DAY AS NEEDED FOR ANXIETY   METOPROLOL (LOPRESSOR) 50 MG TABLET    TAKE 1 TABLET BY MOUTH TWICE A DAY   TAMSULOSIN (FLOMAX) 0.4 MG CAPS  CAPSULE    Take 1 capsule (0.4 mg total) by mouth daily.    Review of Systems  Constitutional: Negative.   Respiratory: Negative for shortness of breath.   Cardiovascular: Positive for chest pain.  Gastrointestinal:       Heartburn and belching     Social History  Substance Use Topics  . Smoking status: Former Smoker    Start date: 07/03/2007  . Smokeless tobacco: Never Used     Comment: QUIT IN 2005  . Alcohol use 0.0 oz/week     Comment: OCCASIONALLY   Objective:   BP (!) 162/108 (BP Location: Right Arm, Patient Position: Sitting, Cuff Size: Normal)   Pulse (!) 57   Temp 97.9 F (36.6 C) (Oral)   Resp 14   Wt 221 lb 12.8 oz (100.6 kg)   SpO2 97%   BMI 32.52 kg/m   Physical Exam  Constitutional: He is oriented to person, place, and time. He appears well-developed and well-nourished. No distress.  HENT:  Head: Normocephalic and atraumatic.  Right Ear: Hearing normal.  Left Ear: Hearing normal.  Nose: Nose normal.  Eyes: Conjunctivae and lids are normal. Right eye exhibits no discharge. Left eye exhibits no discharge. No scleral icterus.  Neck: Neck supple.  Cardiovascular: Normal rate and regular rhythm.   Pulmonary/Chest: Effort normal and breath sounds normal. No respiratory distress.  Abdominal: Soft. Bowel sounds are normal.  Musculoskeletal: Normal  range of motion.  Neurological: He is alert and oriented to person, place, and time.  Skin: Skin is intact. No lesion and no rash noted.  Psychiatric: He has a normal mood and affect. His speech is normal and behavior is normal. Thought content normal.      Assessment & Plan:     1. Precordial pain First episode was on 02-04-16 but lasted only a few minutes while working outdoors in the cold. Recurrence yesterday after a flight back from [place]. No significant dyspnea but having bilateral arm pain/pressure and chest pain. EKG shows possible anterolateral injury with T-wave inversions in precordial leads and ST  elevations (?Brugada?). Has taken ASA daily and Metoprolol 50 mg today. Continues to have discomfort. Recommend he go to the ER now for evaluation. Given an extra 325 mg ASA and transported by wife. - EKG 12-Lead  2. Essential (primary) hypertension Has used Metoprolol 50 mg BID daily for control of hypertension. BP very high at the present with no palpitations but having chest pain.

## 2016-03-09 NOTE — Progress Notes (Signed)
Lab called with a critical troponin of >65. Dr. Saunders Revel notified of same, no new orders at this time.

## 2016-03-09 NOTE — Consult Note (Signed)
Cardiology Consultation Note    Patient ID: Edward [name], MRN: [id number], DOB/AGE: May 13, 1950 66 y.o. Admit date: 03/09/2016   Date of Consult: 03/09/2016 Primary Physician: Vernie Murders, PA Primary Cardiologist: New - Zaniyah Wernette  Chief Complaint: Chest pain Reason for Consultation: STEMI Requesting MD: Lavonia Drafts, MD  HPI: Edward [name] is a 66 y.o. male with history of Hypertension, who presented to his PCP earlier today with chest pain that began yesterday evening. He reports that he was resting when the pain first began and described it as pressure and heaviness involving both arms. He also reports occasional shortness of breath. He had a similar albeit more severe episode about 2-3 weeks ago that occurred while lifting musical equipment. However, this resolved after a few minutes. His pain has continued since last night, fluctuating in intensity. He was referred to the emergency department due to EKG changes by his PCP. Upon arrival, he continued to have 3/10 chest pain. EKG showed anterior Q waves with persistent ST segment elevation. Patient received aspirin at his PCPs office and IV heparin in the ED. He was taken for emergent left heart catheterization demonstrating occlusion of the proximal LAD as well as several diagonal branches. He underwent successful PCI to the proximal and mid LAD with a single drug-eluting stent. His chest pain has resolved. He denies a history of previous cardiac disease.  Past Medical History:  Diagnosis Date  . Anxiety   . BPH (benign prostatic hyperplasia)   . Hypertension       Surgical History:  Past Surgical History:  Procedure Laterality Date  . HERNIA REPAIR  2005  . HYDROCELE EXCISION       Home Meds: Prior to Admission medications   Medication Sig Start Date Elizeo Rodriques Date Taking? Authorizing Provider  Aspirin Buf,AlHyd-MgHyd-CaCar, (ASCRIPTIN) 325 MG TABS Take 1 tablet by mouth daily.   Yes Historical Provider, MD  metoprolol (LOPRESSOR) 50 MG  tablet TAKE 1 TABLET BY MOUTH TWICE A DAY 03/01/16  Yes Dennis E Chrismon, PA  famotidine (PEPCID) 20 MG tablet Take 1 tablet (20 mg total) by mouth 2 (two) times daily. 08/07/15   Vickki Muff Chrismon, PA  LORazepam (ATIVAN) 0.5 MG tablet TAKE 1 TABLET BY MOUTH 3 TIMES A DAY AS NEEDED FOR ANXIETY Patient not taking: Reported on 03/09/2016 10/14/14   Vickki Muff Chrismon, PA  tamsulosin (FLOMAX) 0.4 MG CAPS capsule Take 1 capsule (0.4 mg total) by mouth daily. Patient not taking: Reported on 03/09/2016 02/14/15   Margo Common, PA    Inpatient Medications:  . [START ON 03/10/2016] aspirin  81 mg Oral Daily  . atorvastatin  80 mg Oral q1800  . carvedilol  3.125 mg Oral BID WC  . [START ON 03/10/2016] enoxaparin (LOVENOX) injection  40 mg Subcutaneous Q24H  . famotidine  20 mg Oral BID  . sodium chloride flush  3 mL Intravenous Q12H  . [START ON 03/10/2016] ticagrelor  90 mg Oral BID   . sodium chloride 1,000 mL/hr (03/09/16 1337)  . sodium chloride 50 mL/hr at 03/09/16 1815  . tirofiban 0.15 mcg/kg/min (03/09/16 1815)    Allergies: No Known Allergies  Social History   Social History  . Marital status: Married    Spouse name: N/A  . Number of children: N/A  . Years of education: N/A   Occupational History  . Not on file.   Social History Main Topics  . Smoking status: Former Smoker    Start date: 07/03/2007  .  Smokeless tobacco: Never Used     Comment: QUIT IN 2005  . Alcohol use 0.0 oz/week     Comment: 3-4 beers every other day  . Drug use: No     Comment: smokes pot  . Sexual activity: Not on file   Other Topics Concern  . Not on file   Social History Narrative   Living with family, Independent at baseline     Family History  Problem Relation Age of Onset  . Emphysema Mother   . Macular degeneration Mother   . Heart attack Mother   . Heart attack Father      Review of Systems: A 12-system review of systems was performed and is negative except as noted in the  HPI.  Labs:  Recent Labs  03/09/16 1331 03/09/16 1708  TROPONINI 12.03* >65.00*   Lab Results  Component Value Date   WBC 10.5 03/09/2016   HGB 15.3 03/09/2016   HCT 45.8 03/09/2016   MCV 95.5 03/09/2016   PLT 195 03/09/2016    Recent Labs Lab 03/09/16 1331  NA 142  K 4.7  CL 105  CO2 31  BUN 16  CREATININE 0.92  CALCIUM 9.2  PROT 7.5  BILITOT 0.7  ALKPHOS 45  ALT 52  AST 127*  GLUCOSE 123*   Lab Results  Component Value Date   CHOL 187 03/09/2016   HDL 47 03/09/2016   LDLCALC 124 (H) 03/09/2016   TRIG 79 03/09/2016   No results found for: DDIMER  Radiology/Studies:  No results found.  Wt Readings from Last 3 Encounters:  03/09/16 221 lb 12.8 oz (100.6 kg)  12/16/15 215 lb 6.4 oz (97.7 kg)  08/07/15 213 lb (96.6 kg)    EKG: Normal sinus rhythm with incomplete right bundle branch block. Anterior and inferior Q waves present. ST elevation anterior leads persists but is less pronounced than on the pre-cath tracing. Anterior T-wave inversions also present (I have personally reviewed the current and precath tracings).  Physical Exam: Blood pressure (!) 145/98, pulse 62, resp. rate 18, SpO2 96 %. There is no height or weight on file to calculate BMI. General: Well developed, well nourished, in no acute distress. Head: Normocephalic, atraumatic, sclera non-icteric, no xanthomas, nares are without discharge.  Neck: Negative for carotid bruits. JVD not elevated. Lungs: Clear bilaterally to auscultation without wheezes, rales, or rhonchi. Breathing is unlabored. Heart: RRR with S1 S2. No murmurs, rubs, or gallops appreciated. Abdomen: Soft, non-tender, non-distended with normoactive bowel sounds. No hepatomegaly. No rebound/guarding. No obvious abdominal masses. Msk:  Strength and tone appear normal for age. Extremities: No clubbing or cyanosis. No edema.  Distal pedal pulses are 2+ and equal bilaterally. TR band in place on the right wrist with tiny hematoma  just proximal TR band. This has been stable in size on serial evaluations. Neuro: Alert and oriented X 3. No facial asymmetry. No focal deficit. Moves all extremities spontaneously. Psych:  Responds to questions appropriately with a normal affect.    Assessment and Plan  66 year old man with history of hypertension presenting to his PCP and subsequently the ED with chest pain since last night, found to have evidence of recent anterior MI with Q waves and persistent ST segment elevation as well as inferior Q waves. Cardiac catheterization revealed occlusion of the proximal LAD, successfully treated with single drug-eluting stent.  Anterior STEMI Based on symptoms and EKG findings, this is consistent with a late presenting STEMI. I suspect he may have had subtotal occlusion  in the past with his acute pain beginning last night due to acute plaque rupture and thrombus formation (type I MI). Pain has resolved with PCI. Moderate size D2 was jailed by the stent with ~70% ostial stenosis but TIMI-3 flow. D1 is a moderate-caliber branch that appears chronically occluded. There is also mild to moderate disease involving the LCx and RCA, which will need to be treated medically.  Patient to be monitored in the ICU overnight on the hospitalist service. If he is stable, transfer to the floor could be considered tomorrow afternoon.  Continue tirofiban for 6 hours.  Continue dual antiplatelet therapy with aspirin and ticagrelor for at least 12 months.  Aggressive secondary prevention, including high intensity statin therapy.  Switch metoprolol to carvedilol, as patient will likely have severely reduced LV systolic function due to ischemic cardiomyopathy. Carvedilol can be carefully uptitrated as heart rate and blood pressure allow.  Consider starting ACE inhibitor prior to discharge, his blood pressure and renal function allow.  Obtain transthoracic echocardiogram in the morning.  Trend troponins until they  have peaked, then stop.  Hyperlipidemia LDL elevated, with goal LDL < 70  Atorvastatin 80 mg daily  Check hemoglobin A1c for further risk stratification  Ischemic cardiomyopathy Based on the late presentation and proximal LAD location, the patient will likely have severely reduced LV contraction. He is also at risk for apical thrombus; therefore left heart catheterization and LV gram not performed. He appears euvolemic at this time.  Obtain transthoracic echocardiogram.  Carvedilol 3.125 mg twice a day, to be carefully uptitrated.  Consider adding ACE inhibitor prior to discharge.  If his LVEF is less than 35%, we would recommend LifeVest placement at discharge.  Hypertension Blood pressure normal to mildly elevated the setting of acute MI.  Switch metoprolol to carvedilol, as above.  Consider addition of ACE inhibitor prior to discharge the setting of acute anterior MI with likely reduced LVEF.  Disposition  Admit to ICU on the hospitalist service.  Patient will need to remain hospitalized for at least 2 nights late presenting STEMI with PCI to the proximal LAD.  Verlan Friends Kimberl Vig MD 03/09/2016, 7:53 PM Pager: [phone]

## 2016-03-09 NOTE — Brief Op Note (Signed)
Brief Cardiac Catheterization Note (Full Report to Follow)  Date: 03/09/2016 Time: 4:13 PM  PATIENT:  Edward [name]  66 y.o. male  PRE-OPERATIVE DIAGNOSIS:  STEMI  POST-OPERATIVE DIAGNOSIS:  * No post-op diagnosis entered *  PROCEDURE:  Procedure(s): Coronary Stent Intervention (N/A)  SURGEON:  Surgeon(s) and Role:    * Nelva Bush, MD - Primary  FINDINGS: 1.  Late-presenting anterior STEMI with occlusion of the proximal LAD 2.  Chronic total occlusion of D1. 3.  Non-obstructive CAD involving the RCA and LCx. 4.  Successful IVUS-guided PCI to proximal/mid LAD with placement of Integrity Resolute 3.0 x 38 mm drug-eluting stent. 5.  Prolonged fluoroscopy due to complex PCI.  RECOMMENDATIONS: 1.  Admit to ICU, hospitalist service. 2.  Continue ticagrelor infusion x 6 hours. 3.  Dual antiplatelet therapy with ASA and ticagrelor for at least 12 months. 4.  Cycle troponins until they have peaked, then stop. 5.  Obtain transthoracic echocardiogram. 6.  Change metoprolol to carvedilol 3.125 mg BID, to be uptitrated as HR and BP allow. 7.  Plan to add ACEI before discharge.  Nelva Bush, MD [facility] Pager: [phone]

## 2016-03-09 NOTE — ED Provider Notes (Signed)
[facility] Emergency Department Provider Note   ____________________________________________    I have reviewed the triage vital signs and the nursing notes.   HISTORY  Chief Complaint Chest pain    HPI Edward [name] is a 66 y.o. male who presents with complaints of chest pain. Patient reports his chest pain started last night while at rest. He describes it as a discomfort in the center of his chest. It kept him up all night long, he thought it was related to gas/indigestion. When it did not improve he decided to come to the emergency department. He denies shortness of breath. No nausea vomiting or diaphoresis. No history of heart disease. Does have a history of smoking   No past medical history on file.  Patient Active Problem List   Diagnosis Date Noted  . Anxiety 07/30/2014  . Arthritis of neck ([place]) 07/30/2014  . ED (erectile dysfunction) of organic origin 07/30/2014  . Personal history of tobacco use, presenting hazards to health 07/30/2014  . Adiposity 07/30/2014  . Delayed onset of urination 07/30/2014  . Brachial neuritis 08/26/2009  . Actinic keratoses 07/30/2008  . Essential (primary) hypertension 10/29/2005    Past Surgical History:  Procedure Laterality Date  . HERNIA REPAIR  2005  . HYDROCELE EXCISION      Prior to Admission medications   Medication Sig Start Date End Date Taking? Authorizing Provider  Aspirin Buf,AlHyd-MgHyd-CaCar, (ASCRIPTIN) 325 MG TABS Take 1 tablet by mouth daily.   Yes Historical Provider, MD  metoprolol (LOPRESSOR) 50 MG tablet TAKE 1 TABLET BY MOUTH TWICE A DAY 03/01/16  Yes Dennis E Chrismon, PA  famotidine (PEPCID) 20 MG tablet Take 1 tablet (20 mg total) by mouth 2 (two) times daily. 08/07/15   Vickki Muff Chrismon, PA  LORazepam (ATIVAN) 0.5 MG tablet TAKE 1 TABLET BY MOUTH 3 TIMES A DAY AS NEEDED FOR ANXIETY Patient not taking: Reported on 03/09/2016 10/14/14   Vickki Muff Chrismon, PA  tamsulosin (FLOMAX) 0.4  MG CAPS capsule Take 1 capsule (0.4 mg total) by mouth daily. Patient not taking: Reported on 03/09/2016 02/14/15   [place], PA     Allergies Patient has no known allergies.  Family History  Problem Relation Age of Onset  . Emphysema Mother   . Macular degeneration Mother   . Heart attack Mother   . Heart attack Father     Social History Social History  Substance Use Topics  . Smoking status: Former Smoker    Start date: 07/03/2007  . Smokeless tobacco: Never Used     Comment: QUIT IN 2005  . Alcohol use 0.0 oz/week     Comment: OCCASIONALLY    Review of Systems  Constitutional: No fever/chills  ENT: No sore throat. Cardiovascular: As above Respiratory: Denies shortness of breath. Gastrointestinal: No abdominal pain.  No nausea, no vomiting.    Musculoskeletal: Negative for back pain. Skin: Negative for rash.   10-point ROS otherwise negative.  ____________________________________________   PHYSICAL EXAM:  VITAL SIGNS: ED Triage Vitals  Enc Vitals Group     BP      Pulse      Resp      Temp      Temp src      SpO2      Weight      Height      Head Circumference      Peak Flow      Pain Score  Pain Loc      Pain Edu?      Excl. in [place]?     Constitutional: Alert and oriented. No acute distress. Pleasant and interactive Eyes: Conjunctivae are normal.   Nose: No congestion/rhinnorhea. Mouth/Throat: Mucous membranes are moist.    Cardiovascular: Normal rate, regular rhythm. Grossly normal heart sounds.  Good peripheral circulation. Respiratory: Normal respiratory effort.  No retractions. Lungs CTAB. Gastrointestinal: Soft and nontender. No distention.  No CVA tenderness. Genitourinary: deferred Musculoskeletal: No lower extremity tenderness nor edema.  Warm and well perfused Neurologic:  Normal speech and language. No gross focal neurologic deficits are appreciated.  Skin:  Skin is warm, dry and intact. No rash noted. Psychiatric: Mood  and affect are normal. Speech and behavior are normal.  ____________________________________________   LABS (all labs ordered are listed, but only abnormal results are displayed)  Labs Reviewed  CBC  DIFFERENTIAL  PROTIME-INR  APTT  COMPREHENSIVE METABOLIC PANEL  TROPONIN I  LIPID PANEL   ____________________________________________  EKG  ED ECG REPORT I, Lavonia Drafts, the attending physician, personally viewed and interpreted this ECG.   Date: 03/09/2016   Rate: 60  Rhythm: normal sinus rhythm  Axis: Left axis deviation  Intervals:right bundle branch block  ST&T Change: ST elevations V2 through V5 consistent with STEMI, however q waves throughout   ____________________________________________  RADIOLOGY  None ____________________________________________   PROCEDURES  Procedure(s) performed: No    Critical Care performed: yes  CRITICAL CARE Performed by: Lavonia Drafts   Total critical care time:15 minutes  Critical care time was exclusive of separately billable procedures and treating other patients.  Critical care was necessary to treat or prevent imminent or life-threatening deterioration.  Critical care was time spent personally by me on the following activities: development of treatment plan with patient and/or surrogate as well as nursing, discussions with consultants, evaluation of patient's response to treatment, examination of patient, obtaining history from patient or surrogate, ordering and performing treatments and interventions, ordering and review of laboratory studies, ordering and review of radiographic studies, pulse oximetry and re-evaluation of patient's condition.  ____________________________________________   INITIAL IMPRESSION / ASSESSMENT AND PLAN / ED COURSE  Pertinent labs & imaging results that were available during my care of the patient were reviewed by me and considered in my medical decision making (see chart for  details).  Code STEMI called upon seeing the patient's EKG given onset of chest pain last night. However q waves are suspicious for completed MI. Heparin 4000 units and ASA given.  Dr. Saunders Revel here to see the patient, he will take to the cath lab    ____________________________________________   FINAL CLINICAL IMPRESSION(S) / ED DIAGNOSES  Final diagnoses:  ST elevation myocardial infarction (STEMI), unspecified artery ([place])      NEW MEDICATIONS STARTED DURING THIS VISIT:  New Prescriptions   No medications on file     Note:  This document was prepared using Dragon voice recognition software and may include unintentional dictation errors.    Lavonia Drafts, MD 03/09/16 [phone]

## 2016-03-09 NOTE — Progress Notes (Signed)
CH responded to a PG for Code Stemi. Pt was awake, alert, and being examined by EDP. Wife was bedside. Pt presented in some pain and was understandably anxious. EDP determined Pt should be taken to the Cath Lab. [place] escorted wife to special surgeries waiting area. [place] informed Cath Lab the location of the wife. Wife did not indicate a need for other spiritual care services at this time. [place] will inform [facility] of the status.    03/09/16 1500  Clinical Encounter Type  Visited With Patient;Patient and family together;Health care provider  Visit Type Initial;Spiritual support;Code;ED (Code Stemi)  Referral From Nurse  Consult/Referral To Chaplain  Spiritual Encounters  Spiritual Needs Emotional  Stress Factors  Patient Stress Factors Health changes

## 2016-03-09 NOTE — ED Notes (Signed)
Cardiologist at the bedside and has explained need for cardiac cath.

## 2016-03-09 NOTE — Telephone Encounter (Signed)
Agree with plan 

## 2016-03-09 NOTE — Telephone Encounter (Signed)
Pt called stating he just got back from [place] last night, and is experiencing heart burn, belching, heavy feeling arms. Denies sweats, neck/jaw pain, SOB. Pt is c/o chest pain from sternum to esophagus. Appointment made for 11:30 today. Advised pt to go to ER if sx worsen. Renaldo Fiddler, CMA

## 2016-03-10 ENCOUNTER — Inpatient Hospital Stay
Admit: 2016-03-10 | Discharge: 2016-03-10 | Disposition: A | Discharge status: Home / Self Care | Payer: Commercial Managed Care - PPO | Attending: Internal Medicine | Admitting: Internal Medicine

## 2016-03-10 DIAGNOSIS — I236 Thrombosis of atrium, auricular appendage, and ventricle as current complications following acute myocardial infarction: Secondary | ICD-10-CM

## 2016-03-10 DIAGNOSIS — I2129 ST elevation (STEMI) myocardial infarction involving other sites: Secondary | ICD-10-CM

## 2016-03-10 DIAGNOSIS — I2109 ST elevation (STEMI) myocardial infarction involving other coronary artery of anterior wall: Secondary | ICD-10-CM

## 2016-03-10 LAB — BASIC METABOLIC PANEL
Anion gap: 5 (ref 5–15)
BUN: 12 mg/dL (ref 6–20)
CO2: 26 mmol/L (ref 22–32)
Calcium: 8.3 mg/dL — ABNORMAL LOW (ref 8.9–10.3)
Chloride: 107 mmol/L (ref 101–111)
Creatinine, Ser: 0.73 mg/dL (ref 0.61–1.24)
GFR calc Af Amer: >60 mL/min (ref >60)
GLUCOSE: 117 mg/dL — AB (ref 65–99)
POTASSIUM: 3.5 mmol/L (ref 3.5–5.1)
Sodium: 138 mmol/L (ref 135–145)

## 2016-03-10 LAB — CBC
HEMATOCRIT: 40.7 % (ref 40.0–52.0)
Hemoglobin: 14 g/dL (ref 13.0–18.0)
MCH: 32.2 pg (ref 26.0–34.0)
MCHC: 34.3 g/dL (ref 32.0–36.0)
MCV: 93.8 fL (ref 80.0–100.0)
Platelets: 162 10*3/uL (ref 150–440)
RBC: 4.34 MIL/uL — ABNORMAL LOW (ref 4.40–5.90)
RDW: 13.4 % (ref 11.5–14.5)
WBC: 8.5 10*3/uL (ref 3.8–10.6)

## 2016-03-10 LAB — PROTIME-INR
INR: 1.01
Prothrombin Time: 13.3 seconds (ref 11.4–15.2)

## 2016-03-10 LAB — APTT: APTT: 31 s (ref 24–36)

## 2016-03-10 LAB — HEPARIN LEVEL (UNFRACTIONATED)

## 2016-03-10 LAB — MAGNESIUM: Magnesium: 1.9 mg/dL (ref 1.7–2.4)

## 2016-03-10 LAB — TROPONIN I
TROPONIN I: 29.06 ng/mL — AB (ref <0.03)
Troponin I: 45.71 ng/mL (ref <0.03)

## 2016-03-10 LAB — MRSA PCR SCREENING: MRSA by PCR: NEGATIVE

## 2016-03-10 LAB — HEMOGLOBIN A1C
HEMOGLOBIN A1C: 5.5 % (ref 4.8–5.6)
MEAN PLASMA GLUCOSE: 111 mg/dL

## 2016-03-10 MED ORDER — PERFLUTREN LIPID MICROSPHERE
1.0000 mL | INTRAVENOUS | Status: AC | PRN
  Administered 2016-03-10: 2 mL via INTRAVENOUS
  Filled 2016-03-10: qty 10

## 2016-03-10 MED ORDER — CLOPIDOGREL BISULFATE 75 MG PO TABS
75.0000 mg | ORAL_TABLET | Freq: Every day | ORAL | Status: DC
  Administered 2016-03-12 – 2016-03-19 (×8): 75 mg via ORAL
  Filled 2016-03-10 (×8): qty 1

## 2016-03-10 MED ORDER — POTASSIUM CHLORIDE CRYS ER 20 MEQ PO TBCR
40.0000 meq | EXTENDED_RELEASE_TABLET | Freq: Once | ORAL | Status: AC
  Administered 2016-03-10: 40 meq via ORAL
  Filled 2016-03-10: qty 2

## 2016-03-10 MED ORDER — CLOPIDOGREL BISULFATE 75 MG PO TABS
300.0000 mg | ORAL_TABLET | Freq: Once | ORAL | Status: AC
  Administered 2016-03-11: 300 mg via ORAL
  Filled 2016-03-10: qty 4

## 2016-03-10 MED ORDER — HEPARIN BOLUS VIA INFUSION
4000.0000 [IU] | Freq: Once | INTRAVENOUS | Status: AC
  Administered 2016-03-10: 4000 [IU] via INTRAVENOUS
  Filled 2016-03-10: qty 4000

## 2016-03-10 MED ORDER — HEPARIN (PORCINE) IN NACL 100-0.45 UNIT/ML-% IJ SOLN
1600.0000 [IU]/h | INTRAMUSCULAR | Status: DC
  Administered 2016-03-10 – 2016-03-11 (×2): 1400 [IU]/h via INTRAVENOUS
  Administered 2016-03-12 – 2016-03-14 (×4): 1600 [IU]/h via INTRAVENOUS
  Filled 2016-03-10 (×6): qty 250

## 2016-03-10 NOTE — Progress Notes (Signed)
Patient Name: Louisa Second Date of Encounter: 03/10/2016  Primary Cardiologist: New to [facility] - consult by [facility] Problem List     Principal Problem:   STEMI (ST elevation myocardial infarction) ([place])     Subjective   No further chest pain. Status post IVUS-guided PCI/DES to the proximal and mid LAD with residual disease detailed as above. Patient with prolonged fluoroscopy time of 31.1 minutes. Echo pending to evaluate LVSF and wall motion. Troponin peaked at > 65, now down trending. CBC and renal function stable. Potassium slightly low at 3.5. LDL 124. A1C 5.5%.   Inpatient Medications    Scheduled Meds: . aspirin  81 mg Oral Daily  . atorvastatin  80 mg Oral q1800  . carvedilol  3.125 mg Oral BID WC  . enoxaparin (LOVENOX) injection  40 mg Subcutaneous Q24H  . famotidine  20 mg Oral BID  . sodium chloride flush  3 mL Intravenous Q12H  . ticagrelor  90 mg Oral BID   Continuous Infusions: . sodium chloride 1,000 mL/hr (03/09/16 1337)   PRN Meds: sodium chloride, acetaminophen **OR** acetaminophen, ondansetron (ZOFRAN) IV, ondansetron **OR** ondansetron (ZOFRAN) IV, sodium chloride flush   Vital Signs    Vitals:   03/10/16 0800 03/10/16 0842 03/10/16 0900 03/10/16 1000  BP: 123/83 123/83 105/77 95/68  Pulse: (!) 52 73 67 63  Resp: 11  16 11   Temp: 97.8 F (36.6 C)     TempSrc: Oral     SpO2: 96%  95% 95%  Weight:      Height:        Intake/Output Summary (Last 24 hours) at 03/10/16 1044 Last data filed at 03/10/16 0915  Gross per 24 hour  Intake           777.94 ml  Output             1150 ml  Net          -372.06 ml   Filed Weights   03/10/16 0400  Weight: 216 lb 4.3 oz (98.1 kg)    Physical Exam    GEN: Well nourished, well developed, in no acute distress.  HEENT: Grossly normal.  Neck: Supple, no JVD, carotid bruits, or masses. Cardiac: RRR, no murmurs, rubs, or gallops. No clubbing, cyanosis, edema.  Radials/DP/PT 2+ and equal bilaterally.  Right radial cardiac cath site healing well with minimal bruising. No erythema, TTP, active bleeding, or swelling. Radial pulse 2+.  Respiratory:  Respirations regular and unlabored, clear to auscultation bilaterally. GI: Soft, nontender, nondistended, BS + x 4. MS: no deformity or atrophy. Skin: warm and dry, no rash. Neuro:  Strength and sensation are intact. Psych: AAOx3.  Normal affect.  Labs    CBC  Recent Labs  03/09/16 1331 03/10/16 0527  WBC 10.5 8.5  NEUTROABS 8.0*  --   HGB 15.3 14.0  HCT 45.8 40.7  MCV 95.5 93.8  PLT 195 0000000   Basic Metabolic Panel  Recent Labs  03/09/16 1331 03/10/16 0527  NA 142 138  K 4.7 3.5  CL 105 107  CO2 31 26  GLUCOSE 123* 117*  BUN 16 12  CREATININE 0.92 0.73  CALCIUM 9.2 8.3*   Liver Function Tests  Recent Labs  03/09/16 1331  AST 127*  ALT 52  ALKPHOS 45  BILITOT 0.7  PROT 7.5  ALBUMIN 4.5   No results for input(s): LIPASE, AMYLASE in the last 72 hours. Cardiac Enzymes  Recent Labs  03/09/16 1708  03/09/16 2244 03/10/16 0527  TROPONINI >65.00* >65.00* 45.71*   BNP Invalid input(s): POCBNP D-Dimer No results for input(s): DDIMER in the last 72 hours. Hemoglobin A1C  Recent Labs  03/09/16 1708  HGBA1C 5.5   Fasting Lipid Panel  Recent Labs  03/09/16 1708  CHOL 187  HDL 47  LDLCALC 124*  TRIG 79  CHOLHDL 4.0   Thyroid Function Tests No results for input(s): TSH, T4TOTAL, T3FREE, THYROIDAB in the last 72 hours.  Invalid input(s): FREET3  Telemetry    NSR, 80's bpm, short run of SVT at 131 bpm at 10:32 PM on 2/6 - Personally Reviewed  ECG    Post-intervention EKG showed sinus bradycardia, 58 bpm, left axis deviation, incomplete RBBB, improving anterior st elevation - Personally Reviewed  Radiology    No results found.  Cardiac Studies   LHC 03/09/2016: Coronary Findings   Dominance: Right  Left Main  Vessel is large. Vessel is angiographically normal.  Left Anterior Descending    Vessel is large. Dist LAD filled by collaterals from RPDA.  Ost LAD lesion, 30% stenosed.  Ost LAD to Mid LAD lesion, 100% stenosed. Culprit lesion. The lesion is type C, located at the bifurcation and thrombotic.  Angioplasty: Lesion length: 32 mm. A STENT RESOLUTE INTEG 3.0X38 drug eluting stent was successfully placed. Minimum lumen area: 3 mm. Stent strut is well apposed. There is no pre-interventional antegrade distal flow (TIMI 0). The post-interventional distal flow is normal (TIMI 3). The intervention was successful . See technique section for details of intervention.  Thrombectomy: Aspiration thrombectomy was performed using a CATH PRIORITY ONE AC 64F. A medium thrombus was removed.  There is no residual stenosis post intervention.  First Diagonal Branch  Vessel is moderate in size. 1st Diag filled by collaterals from Lat 2nd Mrg.  Ost 1st Diag lesion, 100% stenosed.  First Septal Branch  1st Sept filled by collaterals from Inf Sept.  Second Diagonal Branch  Vessel is moderate in size.  Ost 2nd Diag lesion, 50% stenosed.  Thrombectomy: Aspiration thrombectomy was performed using a CATH PRIORITY ONE AC 64F. A medium thrombus was removed.  There is a 70% residual stenosis post intervention.  Left Circumflex  Vessel is moderate in size. The vessel is tortuous.  Prox Cx to Mid Cx lesion, 40% stenosed.  Mid Cx lesion, 60% stenosed.  First Obtuse Marginal Branch  Vessel is small in size.  Second Obtuse Marginal Branch  2nd Mrg lesion, 50% stenosed.  Third Obtuse Marginal Branch  Vessel is small in size.  Right Coronary Artery  Vessel is large.  Prox RCA lesion, 40% stenosed.  Dist RCA lesion, 20% stenosed.  Right Posterior Descending Artery  Vessel is small in size.  Right Posterior Atrioventricular Branch  Vessel is moderate in size.  Post Atrio lesion, 50% stenosed.  Coronary Diagrams   Diagnostic Diagram     Post-Intervention Diagram      Recommendations: 1. Admit  to ICU, hospitalist service. 2. Continue ticagrelor infusion x 6 hours. 3. Dual antiplatelet therapy with ASA and ticagrelor for at least 12 months. 4. Cycle troponins until they have peaked, then stop. 5. Obtain transthoracic echocardiogram. 6. Change metoprolol to carvedilol 3.125 mg BID, to be uptitrated as HR and BP allow. 7. Plan to add ACEI before discharge. 8.  Close monitoring and counseling regarding extended fluoroscopy time and radiation exposure.   Echo pending.   Patient Profile     66 y.o. male with history of HTN who presented to  [place] on 2/6 after presenting to his PCP eariler in the day with chest pain that began on 2/5 that was similar to chest pain he had 3-4 weeks prior was found to have a late-presenting anterior ST elevation MI s/p IVUS-guided PCI/DES to the proximal and mid LAD as detailed above.   Assessment & Plan    1. Anterior ST elevation myocardial infarction: -Late presenting, possibly felt to have had subtotal occlusion in the past with his acute pain beginning last night due to acute plaque rupture and thrombus formation (type I MI). Pain has resolved with PCI. Moderate size D2 was jailed by the stent with ~70% ostial stenosis but TIMI-3 flow. D1 is a moderate-caliber branch that appears chronically occluded. There is also mild to moderate disease involving the LCx and RCA, which will need to be treated medically. -Continue DAPT with ASA 81 mg and Brilinta 90 mg bid for at least the next 12 months -Echo pending to evaluate LVSF and wall motion -If he is found to have a post-intervention EF of <35% he will need LifeVest prior to discharge and optimization of heart failure medications to max tolerated dose -Lipitor, Coreg -If BP allows and EF indicates, consider starting ACEi and/or spironolactone prior to discharge -Currently chest pain free -Cardiac rehab  2. ICM: -He does not appear volume overloaded at this time -Coreg as above, titrate as  tolerated -Consider adding ACEi/spironolactone prior to discharge -If EF is reduced may need prn Lasix -Await echo -If EF is <35%, will need LifeVest prior to discharge  3. HTN: -Improved -Continue medications as above  4. HLD: -LDL not at goal -Lipitor as above -Will need follow up lipid and liver in 8 weeks as an outpatient  5. Subclinical hypokalemia: -Replete to 4.0 -Check magnesium and replete to 2.0 as indicated   6. Dispo: -Would keep in ICU for today with possible transfer to 2A this evening vs morning of 2/8   Signed, Marcille Blanco [place] Pager: [phone] 03/10/2016, 10:44 AM

## 2016-03-10 NOTE — Progress Notes (Signed)
   Post-intervention echo showed an EF of 30-35% with likely mural thrombus. Dr. Saunders Revel plans to start heparin gtt after discussing with the patient. Will need to transition to Coumadin prior to discharge. He will be on triple therapy given the above finding as well as recent PCI this admission. Given his cardiomyopathy he will also need a LifeVest prior to discharge. Case management consult has been placed to assist with this.

## 2016-03-10 NOTE — Progress Notes (Signed)
ANTICOAGULATION CONSULT NOTE - Initial Consult  Pharmacy Consult for heparin  Indication: left ventricular mural thrombus  No Known Allergies  Patient Measurements: Height: 5\' 11"  (180.3 cm) Weight: 216 lb 4.3 oz (98.1 kg) IBW/kg (Calculated) : 75.3  Vital Signs: Temp: 97.8 F (36.6 C) (02/07 0800) Temp Source: Oral (02/07 0800) BP: 107/80 (02/07 1400) Pulse Rate: 66 (02/07 1400)  Labs:  Recent Labs  03/09/16 1331  03/09/16 2244 03/10/16 0527 03/10/16 1112  HGB 15.3  --   --  14.0  --   HCT 45.8  --   --  40.7  --   PLT 195  --   --  162  --   APTT 30  --   --   --   --   LABPROT 12.5  --   --   --   --   INR 0.93  --   --   --   --   CREATININE 0.92  --   --  0.73  --   TROPONINI 12.03*  < > >65.00* 45.71* 29.06*  < > = values in this interval not displayed.  Estimated Creatinine Clearance: 111.4 mL/min (by C-G formula based on SCr of 0.73 mg/dL).   Medical History: Past Medical History:  Diagnosis Date  . Anxiety   . BPH (benign prostatic hyperplasia)   . Hypertension     Assessment: 66 yo male admitted with STEMI, now found to have left ventricular mural thrombus. Pharmacy consulted for heparin dosing and monitoring.   Goal of Therapy:  Heparin level 0.3-0.7 units/ml Monitor platelets by anticoagulation protocol: Yes   Plan:  Baseline labs ordered Give 4000 units bolus x 1 Start heparin infusion at 1400 units/hr Check anti-Xa level in 6 hours and daily while on heparin Continue to monitor H&H and platelets  Pernell Dupre, PharmD, BCPS Clinical Pharmacist 03/10/2016 4:41 PM

## 2016-03-10 NOTE — Care Management (Addendum)
Patient transferred from ICU to 2A.  Admitted with stemi. Started on heparin drip due to likely mual thrombus.  Edward [name] is initiating a Facilities manager referral.  CM faxed information to [employer].  There is no actual order form in the shadow chart for the vest.  Will check tomorrow to see if order has been sent.  REceived confirmation that faxed was sent to [place]. Checking to see if patient is going to have to have this in place prior to discharge

## 2016-03-10 NOTE — Progress Notes (Signed)
Patient ID: Edward [name], male   DOB: Mar 17, 1950, 66 y.o.   MRN: [id number]  [employer] PROGRESS NOTE  Edward [name] [id number] DOB: [birth year] DOA: 03/09/2016 PCP: Vernie Murders, PA  HPI/Subjective: Patient feeling fine. When he came in he had severe weight on both of his arms. Also did have a little chest pain at that time but not bad. Currently feeling fine  Objective: Vitals:   03/10/16 1300 03/10/16 1400  BP: 104/75 107/80  Pulse: (!) 57 66  Resp: 11 16  Temp:      Filed Weights   03/10/16 0400  Weight: 98.1 kg (216 lb 4.3 oz)    ROS: Review of Systems  Constitutional: Negative for chills and fever.  Eyes: Negative for blurred vision.  Respiratory: Negative for cough and shortness of breath.   Cardiovascular: Negative for chest pain.  Gastrointestinal: Negative for abdominal pain, constipation, diarrhea, nausea and vomiting.  Genitourinary: Negative for dysuria.  Musculoskeletal: Negative for joint pain.  Neurological: Negative for dizziness and headaches.   Exam: Physical Exam  HENT:  Nose: No mucosal edema.  Mouth/Throat: No oropharyngeal exudate or posterior oropharyngeal edema.  Eyes: Conjunctivae, EOM and lids are normal. Pupils are equal, round, and reactive to light.  Neck: No JVD present. Carotid bruit is not present. No edema present. No thyroid mass and no thyromegaly present.  Cardiovascular: S1 normal and S2 normal.  Exam reveals no gallop.   No murmur heard. Pulses:      Dorsalis pedis pulses are 2+ on the right side, and 2+ on the left side.  Respiratory: No respiratory distress. He has no wheezes. He has no rhonchi. He has no rales.  GI: Soft. Bowel sounds are normal. There is no tenderness.  Musculoskeletal:       Right ankle: He exhibits no swelling.       Left ankle: He exhibits no swelling.  Lymphadenopathy:    He has no cervical adenopathy.  Neurological: He is alert. No cranial nerve deficit.  Skin: Skin is warm. No rash noted.  Nails show no clubbing.  Psychiatric: He has a normal mood and affect.      Data Reviewed: Basic Metabolic Panel:  Recent Labs Lab 03/09/16 1331 03/10/16 0527 03/10/16 1112  NA 142 138  --   K 4.7 3.5  --   CL 105 107  --   CO2 31 26  --   GLUCOSE 123* 117*  --   BUN 16 12  --   CREATININE 0.92 0.73  --   CALCIUM 9.2 8.3*  --   MG  --   --  1.9   Liver Function Tests:  Recent Labs Lab 03/09/16 1331  AST 127*  ALT 52  ALKPHOS 45  BILITOT 0.7  PROT 7.5  ALBUMIN 4.5   CBC:  Recent Labs Lab 03/09/16 1331 03/10/16 0527  WBC 10.5 8.5  NEUTROABS 8.0*  --   HGB 15.3 14.0  HCT 45.8 40.7  MCV 95.5 93.8  PLT 195 162   Cardiac Enzymes:  Recent Labs Lab 03/09/16 1331 03/09/16 1708 03/09/16 2244 03/10/16 0527 03/10/16 1112  TROPONINI 12.03* >65.00* >65.00* 45.71* 29.06*    CBG:  Recent Labs Lab 03/09/16 1700  GLUCAP 78    Recent Results (from the past 240 hour(s))  MRSA PCR Screening     Status: None   Collection Time: 03/09/16  6:33 PM  Result Value Ref Range Status   MRSA by PCR NEGATIVE NEGATIVE Final  Comment:        The GeneXpert MRSA Assay (FDA approved for NASAL specimens only), is one component of a comprehensive MRSA colonization surveillance program. It is not intended to diagnose MRSA infection nor to guide or monitor treatment for MRSA infections.       Scheduled Meds: . aspirin  81 mg Oral Daily  . atorvastatin  80 mg Oral q1800  . carvedilol  3.125 mg Oral BID WC  . enoxaparin (LOVENOX) injection  40 mg Subcutaneous Q24H  . famotidine  20 mg Oral BID  . sodium chloride flush  3 mL Intravenous Q12H  . ticagrelor  90 mg Oral BID   Continuous Infusions: . sodium chloride 1,000 mL/hr (03/09/16 1337)    Assessment/Plan:  1. STEMI.  Patient urgently taken to the cardiac catheterization on 03/09/2016 and stents placed. Please see operative report. Continue aspirin, Brilinta, atorvastatin and low-dose Coreg.  Echocardiogram pending. Potential disposition tomorrow. Troponin trending down 2. Hyperlipidemia unspecified. LDL 124 and goal is less than 70 3. Relative hypotension and bradycardia. Will be limited with other medications. 4. Hypokalemia 1 dose of potassium ordered  Code Status:     Code Status Orders        Start     Ordered   03/09/16 1643  Full code  Continuous     03/09/16 1642    Code Status History    Date Active Date Inactive Code Status Order ID Comments User Context   03/09/2016  4:42 PM 03/09/2016  4:42 PM Full Code [id number]  Nelva Bush, MD Inpatient     Family Communication: Wife at the bedside Disposition Plan: Potential discharge tomorrow  Consultants:  Cardiology  Time spent: 28 minutes  Loletha Grayer  [employer]

## 2016-03-10 NOTE — Plan of Care (Signed)
Problem: Safety: Goal: Ability to remain free from injury will improve Outcome: Progressing Pt remains free from injury.     

## 2016-03-11 DIAGNOSIS — F17200 Nicotine dependence, unspecified, uncomplicated: Secondary | ICD-10-CM

## 2016-03-11 DIAGNOSIS — Z7189 Other specified counseling: Secondary | ICD-10-CM

## 2016-03-11 DIAGNOSIS — E785 Hyperlipidemia, unspecified: Secondary | ICD-10-CM

## 2016-03-11 DIAGNOSIS — I2109 ST elevation (STEMI) myocardial infarction involving other coronary artery of anterior wall: Secondary | ICD-10-CM

## 2016-03-11 DIAGNOSIS — I2589 Other forms of chronic ischemic heart disease: Secondary | ICD-10-CM

## 2016-03-11 DIAGNOSIS — I255 Ischemic cardiomyopathy: Secondary | ICD-10-CM

## 2016-03-11 DIAGNOSIS — E782 Mixed hyperlipidemia: Secondary | ICD-10-CM

## 2016-03-11 DIAGNOSIS — I2102 ST elevation (STEMI) myocardial infarction involving left anterior descending coronary artery: Principal | ICD-10-CM

## 2016-03-11 LAB — CBC
HCT: 41.9 % (ref 40.0–52.0)
Hemoglobin: 14.5 g/dL (ref 13.0–18.0)
MCH: 32.6 pg (ref 26.0–34.0)
MCHC: 34.6 g/dL (ref 32.0–36.0)
MCV: 94.4 fL (ref 80.0–100.0)
PLATELETS: 160 10*3/uL (ref 150–440)
RBC: 4.44 MIL/uL (ref 4.40–5.90)
RDW: 13.4 % (ref 11.5–14.5)
WBC: 9.1 10*3/uL (ref 3.8–10.6)

## 2016-03-11 LAB — BASIC METABOLIC PANEL
ANION GAP: 7 (ref 5–15)
BUN: 12 mg/dL (ref 6–20)
CALCIUM: 8.5 mg/dL — AB (ref 8.9–10.3)
CO2: 23 mmol/L (ref 22–32)
CREATININE: 0.72 mg/dL (ref 0.61–1.24)
Chloride: 108 mmol/L (ref 101–111)
Glucose, Bld: 122 mg/dL — ABNORMAL HIGH (ref 65–99)
Potassium: 3.8 mmol/L (ref 3.5–5.1)
Sodium: 138 mmol/L (ref 135–145)

## 2016-03-11 LAB — HEPARIN LEVEL (UNFRACTIONATED)
HEPARIN UNFRACTIONATED: 0.35 [IU]/mL (ref 0.30–0.70)
Heparin Unfractionated: 0.33 IU/mL (ref 0.30–0.70)

## 2016-03-11 MED ORDER — WARFARIN SODIUM 5 MG PO TABS: 5.0000 mg | ORAL_TABLET | Freq: Every day | ORAL | Status: DC

## 2016-03-11 MED ORDER — LOSARTAN POTASSIUM 25 MG PO TABS
12.5000 mg | ORAL_TABLET | Freq: Every day | ORAL | Status: DC
  Administered 2016-03-11 – 2016-03-19 (×9): 12.5 mg via ORAL
  Filled 2016-03-11 (×9): qty 1

## 2016-03-11 MED ORDER — WARFARIN SODIUM 5 MG PO TABS
7.5000 mg | ORAL_TABLET | Freq: Once | ORAL | Status: AC
  Administered 2016-03-11: 7.5 mg via ORAL
  Filled 2016-03-11: qty 2

## 2016-03-11 MED ORDER — WARFARIN - PHARMACIST DOSING INPATIENT
Freq: Every day | Status: DC
  Administered 2016-03-11 – 2016-03-14 (×4)

## 2016-03-11 NOTE — Care Management (Signed)
All referral information has been sent to [facility] for Life Vest.  Patient declines to bridge with lovenox.  Family member had bad experience.

## 2016-03-11 NOTE — Progress Notes (Signed)
ANTICOAGULATION CONSULT NOTE - Follow Up Consult  Pharmacy Consult for Heparin and Warfarin bridging Indication: left ventricular mural thrombus  No Known Allergies  Patient Measurements: Height: 5\' 11"  (180.3 cm) Weight: 216 lb 4.3 oz (98.1 kg) IBW/kg (Calculated) : 75.3 Heparin Dosing Weight: 95.3 kg  Vital Signs: Temp: 97.6 F (36.4 C) (02/08 0842) Temp Source: Oral (02/08 0842) BP: 129/87 (02/08 0842) Pulse Rate: 64 (02/08 0842)  Labs:  Recent Labs  03/09/16 1331  03/09/16 2244 03/10/16 0527 03/10/16 1112 03/10/16 1644 03/11/16 0011 03/11/16 0348  HGB 15.3  --   --  14.0  --   --   --  14.5  HCT 45.8  --   --  40.7  --   --   --  41.9  PLT 195  --   --  162  --   --   --  160  APTT 30  --   --   --   --  31  --   --   LABPROT 12.5  --   --   --   --  13.3  --   --   INR 0.93  --   --   --   --  1.01  --   --   HEPARINUNFRC  --   --   --   --   --  <0.10* 0.33 0.35  CREATININE 0.92  --   --  0.73  --   --   --  0.72  TROPONINI 12.03*  < > >65.00* 45.71* 29.06*  --   --   --   < > = values in this interval not displayed.  Estimated Creatinine Clearance: 111.4 mL/min (by C-G formula based on SCr of 0.72 mg/dL).  Assessment: 66 yo male admitted with STEMI, now found to have left ventricular mural thrombus. Pharmacy consulted for heparin and warfarin dosing and monitoring.  Heparin currently running at 1400 units/hr. To begin warfarin therapy today.  Goal of Therapy:  INR 2-3 Heparin level 0.3-0.7 units/ml Monitor platelets by anticoagulation protocol: Yes   Plan:  Will continue with Heparin at current rate of 1400 units/hr and check HL and CBC with AM labs. Will initiate Warfarin 7.5mg  tonight and follow daily INR levels.  Paulina Fusi, PharmD, BCPS 03/11/2016 12:04 PM

## 2016-03-11 NOTE — Progress Notes (Addendum)
ANTICOAGULATION CONSULT NOTE - Initial Consult  Pharmacy Consult for heparin and warfarin Indication: left ventricular mural thrombus  No Known Allergies  Patient Measurements: Height: 5\' 11"  (180.3 cm) Weight: 216 lb 4.3 oz (98.1 kg) IBW/kg (Calculated) : 75.3  Vital Signs: Temp: 99.5 F (37.5 C) (02/07 1932) Temp Source: Oral (02/07 1932) BP: 133/66 (02/07 1932) Pulse Rate: 68 (02/07 1932)  Labs:  Recent Labs  03/09/16 1331  03/09/16 2244 03/10/16 0527 03/10/16 1112 03/10/16 1644 03/11/16 0011  HGB 15.3  --   --  14.0  --   --   --   HCT 45.8  --   --  40.7  --   --   --   PLT 195  --   --  162  --   --   --   APTT 30  --   --   --   --  31  --   LABPROT 12.5  --   --   --   --  13.3  --   INR 0.93  --   --   --   --  1.01  --   HEPARINUNFRC  --   --   --   --   --  <0.10* 0.33  CREATININE 0.92  --   --  0.73  --   --   --   TROPONINI 12.03*  < > >65.00* 45.71* 29.06*  --   --   < > = values in this interval not displayed.  Estimated Creatinine Clearance: 111.4 mL/min (by C-G formula based on SCr of 0.73 mg/dL).   Medical History: Past Medical History:  Diagnosis Date  . Anxiety   . BPH (benign prostatic hyperplasia)   . Hypertension     Assessment: 66 yo male admitted with STEMI, now found to have left ventricular mural thrombus. Pharmacy consulted for heparin dosing and monitoring.   Goal of Therapy:  Heparin level 0.3-0.7 units/ml Monitor platelets by anticoagulation protocol: Yes   Plan:  Baseline labs ordered Give 4000 units bolus x 1 Start heparin infusion at 1400 units/hr Check anti-Xa level in 6 hours and daily while on heparin Continue to monitor H&H and platelets   2/8 00:00 heparin level 0.33. Recheck with AM labs to confirm. Will start 5 mg warfarin daily. INR with tomorrow AM labs.  2/8 AM heparin level 0.35. Continue current regimen. Recheck with tomorrow AM labs.   Eloise Harman, PharmD, BCPS Clinical  Pharmacist 03/11/2016 3:09 AM

## 2016-03-11 NOTE — Progress Notes (Addendum)
Progress Note  Patient Name: Edward [name] Date of Encounter: 03/11/2016  Primary Cardiologist: Dr. Saunders Revel   Subjective   Denies any significant chest pain or shortness of breath at rest, Has not been out of bed except to urinate. During nervous about recent events, Discussed blood thinners, anticoagulation with him He prefers not to be on Lovenox given prior severe bleed with his mother who is on Lovenox. He prefers warfarin with heparin bridging Discussed the need for triple therapy for short period of time, need for the vest  Inpatient Medications    Scheduled Meds: . aspirin  81 mg Oral Daily  . atorvastatin  80 mg Oral q1800  . carvedilol  3.125 mg Oral BID WC  . clopidogrel  300 mg Oral Once  . [START ON 03/12/2016] clopidogrel  75 mg Oral Daily  . famotidine  20 mg Oral BID  . sodium chloride flush  3 mL Intravenous Q12H  . warfarin  5 mg Oral q1800   Continuous Infusions: . sodium chloride 1,000 mL/hr (03/09/16 1337)  . heparin 1,400 Units/hr (03/11/16 0847)   PRN Meds: sodium chloride, acetaminophen **OR** acetaminophen, ondansetron (ZOFRAN) IV, ondansetron **OR** ondansetron (ZOFRAN) IV, sodium chloride flush   Vital Signs    Vitals:   03/10/16 1400 03/10/16 1932 03/11/16 0421 03/11/16 0842  BP: 107/80 133/66 136/80 129/87  Pulse: 66 68 63 64  Resp: 16 18 18 16   Temp:  99.5 F (37.5 C) 98.4 F (36.9 C) 97.6 F (36.4 C)  TempSrc:  Oral Oral Oral  SpO2: 95% 97% 95% 96%  Weight:      Height:        Intake/Output Summary (Last 24 hours) at 03/11/16 0945 Last data filed at 03/11/16 0750  Gross per 24 hour  Intake              243 ml  Output             2825 ml  Net            -2582 ml   Filed Weights   03/10/16 0400  Weight: 216 lb 4.3 oz (98.1 kg)    Telemetry    Maintaining normal sinus rhythm  ECG     Physical Exam    GEN: No acute distress.  Neck: No JVD  Cardiac: RRR, no murmurs, rubs, or gallops.  Radials/DP/PT 2+ and equal  bilaterally.  Respiratory:  Clear to auscultation bilaterally. GI: Soft, nontender, non-distended  MS: no deformity; no edema Neuro:  Alert and oriented x 3  Labs    Chemistry Recent Labs Lab 03/09/16 1331 03/10/16 0527 03/11/16 0348  NA 142 138 138  K 4.7 3.5 3.8  CL 105 107 108  CO2 31 26 23   GLUCOSE 123* 117* 122*  BUN 16 12 12   CREATININE 0.92 0.73 0.72  CALCIUM 9.2 8.3* 8.5*  PROT 7.5  --   --   ALBUMIN 4.5  --   --   AST 127*  --   --   ALT 52  --   --   ALKPHOS 45  --   --   BILITOT 0.7  --   --   GFRNONAA >60 >60 >60  GFRAA >60 >60 >60  ANIONGAP 6 5 7      Hematology Recent Labs Lab 03/09/16 1331 03/10/16 0527 03/11/16 0348  WBC 10.5 8.5 9.1  RBC 4.80 4.34* 4.44  HGB 15.3 14.0 14.5  HCT 45.8 40.7 41.9  MCV 95.5  93.8 94.4  MCH 32.0 32.2 32.6  MCHC 33.5 34.3 34.6  RDW 13.5 13.4 13.4  PLT 195 162 160    Cardiac Enzymes Recent Labs Lab 03/09/16 1708 03/09/16 2244 03/10/16 0527 03/10/16 1112  TROPONINI >65.00* >65.00* 45.71* 29.06*   No results for input(s): TROPIPOC in the last 168 hours.   BNPNo results for input(s): BNP, PROBNP in the last 168 hours.   DDimer No results for input(s): DDIMER in the last 168 hours.   Radiology      Cardiac Studies   Echo: 1. Moderate to severe LV dysfuction with LAD territory infarct.   LVEF 30-35%.   2. Likely mural thrombus at the left ventricular apex.  Patient Profile     66 y.o. male with history of hypertension admitted with late presenting anterior STEMI complicated by severe LV dysfunction due to ischemic cardiomyopathy,  left ventricular apical thrombus, EF of 30%    Assessment & Plan    taken for emergent left heart catheterization demonstrating occlusion of the proximal LAD as well as several diagonal branches. He underwent successful PCI to the proximal and mid LAD with a single drug-eluting stent.   Details as below Thrombotic occlusion of the proximal/mid LAD involving D1 and  D2. Mild to moderate, non-obstructive CAD involving LCx and RCA. Successful IVUS-guided PCI to LAD proximal and mid LAD using an Integrity Resolute 3.0 x 38 mm drug-eluting stent (post-dilated up to 4.0 mm) with 0% residual stenosis and TIMI-flow.  D2 was jailed by the LAD stent resulting in 70% ostial stenosis with TIMI-3 flow.  On  heparin infusion bridge to warfarin (he refused lovenox based on bad experience in the past, mother with severe bleeding on lovenox) On asa and clopidogrel   At least one month of triple therapy, after which time discontinuation of aspirin can be considered.  continue with low-dose carvedilol and  Add low dose ACE inhibitor  Given severe LV dysfunction, STEMI, LAD distribution, patient will need to wear a LifeVest  Long Discussion concerning risk of arrhythmia, benefit of LifeVest, How to wear the vest  Also long discussion concerning anticoagulation, dietary changes needed on warfarin   Total encounter time more than 35 minutes  Greater than 50% was spent in counseling and coordination of care with the patient   Signed, Ida Rogue, MD  03/11/2016, 9:45 AM

## 2016-03-11 NOTE — Progress Notes (Signed)
Patient ID: Edward [name], male   DOB: 21-Apr-1950, 65 y.o.   MRN: [id number]  [facility] PROGRESS NOTE  Edward [name] DOB: Jun 03, 1950 DOA: 03/09/2016 PCP: Edward Murders, PA  HPI/Subjective: Patient offers no complaints. Feeling well. No chest pain or shortness of breath.  Objective: Vitals:   03/11/16 0842 03/11/16 1218  BP: 129/87 131/83  Pulse: 64 60  Resp: 16 20  Temp: 97.6 F (36.4 C) 97.7 F (36.5 C)    Filed Weights   03/10/16 0400  Weight: 98.1 kg (216 lb 4.3 oz)    ROS: Review of Systems  Constitutional: Negative for chills and fever.  Eyes: Negative for blurred vision.  Respiratory: Negative for cough and shortness of breath.   Cardiovascular: Negative for chest pain.  Gastrointestinal: Negative for abdominal pain, constipation, diarrhea, nausea and vomiting.  Genitourinary: Negative for dysuria.  Musculoskeletal: Negative for joint pain.  Neurological: Negative for dizziness and headaches.   Exam: Physical Exam  HENT:  Nose: No mucosal edema.  Mouth/Throat: No oropharyngeal exudate or posterior oropharyngeal edema.  Eyes: Conjunctivae, EOM and lids are normal. Pupils are equal, round, and reactive to light.  Neck: No JVD present. Carotid bruit is not present. No edema present. No thyroid mass and no thyromegaly present.  Cardiovascular: S1 normal and S2 normal.  Exam reveals no gallop.   No murmur heard. Pulses:      Dorsalis pedis pulses are 2+ on the right side, and 2+ on the left side.  Respiratory: No respiratory distress. He has no wheezes. He has no rhonchi. He has no rales.  GI: Soft. Bowel sounds are normal. There is no tenderness.  Musculoskeletal:       Right ankle: He exhibits no swelling.       Left ankle: He exhibits no swelling.  Lymphadenopathy:    He has no cervical adenopathy.  Neurological: He is alert. No cranial nerve deficit.  Skin: Skin is warm. No rash noted. Nails show no clubbing.  Psychiatric: He has a normal  mood and affect.      Data Reviewed: Basic Metabolic Panel:  Recent Labs Lab 03/09/16 1331 03/10/16 0527 03/10/16 1112 03/11/16 0348  NA 142 138  --  138  K 4.7 3.5  --  3.8  CL 105 107  --  108  CO2 31 26  --  23  GLUCOSE 123* 117*  --  122*  BUN 16 12  --  12  CREATININE 0.92 0.73  --  0.72  CALCIUM 9.2 8.3*  --  8.5*  MG  --   --  1.9  --    Liver Function Tests:  Recent Labs Lab 03/09/16 1331  AST 127*  ALT 52  ALKPHOS 45  BILITOT 0.7  PROT 7.5  ALBUMIN 4.5   CBC:  Recent Labs Lab 03/09/16 1331 03/10/16 0527 03/11/16 0348  WBC 10.5 8.5 9.1  NEUTROABS 8.0*  --   --   HGB 15.3 14.0 14.5  HCT 45.8 40.7 41.9  MCV 95.5 93.8 94.4  PLT 195 162 160   Cardiac Enzymes:  Recent Labs Lab 03/09/16 1331 03/09/16 1708 03/09/16 2244 03/10/16 0527 03/10/16 1112  TROPONINI 12.03* >65.00* >65.00* 45.71* 29.06*    CBG:  Recent Labs Lab 03/09/16 1700  GLUCAP 78    Recent Results (from the past 240 hour(s))  MRSA PCR Screening     Status: None   Collection Time: 03/09/16  6:33 PM  Result Value Ref Range Status  MRSA by PCR NEGATIVE NEGATIVE Final    Comment:        The GeneXpert MRSA Assay (FDA approved for NASAL specimens only), is one component of a comprehensive MRSA colonization surveillance program. It is not intended to diagnose MRSA infection nor to guide or monitor treatment for MRSA infections.       Scheduled Meds: . aspirin  81 mg Oral Daily  . atorvastatin  80 mg Oral q1800  . carvedilol  3.125 mg Oral BID WC  . [START ON 03/12/2016] clopidogrel  75 mg Oral Daily  . famotidine  20 mg Oral BID  . losartan  12.5 mg Oral Daily  . sodium chloride flush  3 mL Intravenous Q12H  . Warfarin - Pharmacist Dosing Inpatient   Does not apply q1800   Continuous Infusions: . sodium chloride 1,000 mL/hr (03/09/16 1337)  . heparin 1,400 Units/hr (03/11/16 0847)    Assessment/Plan:  1. STEMI.  Patient urgently taken to the cardiac  catheterization on 03/09/2016 and stents placed. Please see operative report. Continue aspirin, Plavix, atorvastatin and low-dose Coreg. 2. Left ventricle thrombus. Patient started on heparin drip and Coumadin. Case discussed with cardiologist that the new were blood thinners are not approved for left ventricular thrombus. Patient is absolutely against starting Lovenox injections. He will be here until his INR is therapeutic. 3. Cardiomyopathy secondary to STEMI. Low-dose losartan started. Continue Coreg. 4. Hyperlipidemia unspecified. LDL 124 and goal is less than 70 5. Relative hypotension has improved 6. Hypokalemia - supplemented  Code Status:     Code Status Orders        Start     Ordered   03/09/16 1643  Full code  Continuous     03/09/16 1642    Code Status History    Date Active Date Inactive Code Status Order ID Comments User Context   03/09/2016  4:42 PM 03/09/2016  4:42 PM Full Code [id number]  Nelva Bush, MD Inpatient     Disposition Plan: Since the patient is scared about Lovenox injections, the patient will be here until INR is therapeutic on Coumadin  Consultants:  Cardiology  Time spent: 26 minutes  [place], [place]

## 2016-03-12 LAB — CBC
HCT: 42.4 % (ref 40.0–52.0)
HEMOGLOBIN: 14.5 g/dL (ref 13.0–18.0)
MCH: 32.6 pg (ref 26.0–34.0)
MCHC: 34.1 g/dL (ref 32.0–36.0)
MCV: 95.6 fL (ref 80.0–100.0)
PLATELETS: 165 10*3/uL (ref 150–440)
RBC: 4.44 MIL/uL (ref 4.40–5.90)
RDW: 13.3 % (ref 11.5–14.5)
WBC: 8.4 10*3/uL (ref 3.8–10.6)

## 2016-03-12 LAB — HEPARIN LEVEL (UNFRACTIONATED)
HEPARIN UNFRACTIONATED: 0.2 [IU]/mL — AB (ref 0.30–0.70)
HEPARIN UNFRACTIONATED: 0.45 [IU]/mL (ref 0.30–0.70)
Heparin Unfractionated: 0.42 IU/mL (ref 0.30–0.70)

## 2016-03-12 LAB — PROTIME-INR
INR: 1.09
Prothrombin Time: 14.1 seconds (ref 11.4–15.2)

## 2016-03-12 MED ORDER — HEPARIN BOLUS VIA INFUSION
1400.0000 [IU] | Freq: Once | INTRAVENOUS | Status: AC
  Administered 2016-03-12: 1400 [IU] via INTRAVENOUS
  Filled 2016-03-12: qty 1400

## 2016-03-12 MED ORDER — WARFARIN SODIUM 7.5 MG PO TABS
7.5000 mg | ORAL_TABLET | Freq: Once | ORAL | Status: AC
  Administered 2016-03-12: 7.5 mg via ORAL
  Filled 2016-03-12: qty 1

## 2016-03-12 NOTE — Progress Notes (Addendum)
ANTICOAGULATION CONSULT NOTE - Follow Up Consult  Pharmacy Consult for Heparin and Warfarin bridging Indication: left ventricular mural thrombus  No Known Allergies  Patient Measurements: Height: 5\' 11"  (180.3 cm) Weight: 216 lb 4.3 oz (98.1 kg) IBW/kg (Calculated) : 75.3 Heparin Dosing Weight: 95.3 kg  Vital Signs: Temp: 98.2 F (36.8 C) (02/09 0736) Temp Source: Oral (02/09 0736) BP: 108/63 (02/09 0736) Pulse Rate: 55 (02/09 0736)  Labs:  Recent Labs  03/09/16 2244  03/10/16 0527 03/10/16 1112 03/10/16 1644  03/11/16 0348 03/12/16 0419 03/12/16 1155 03/12/16 1801  HGB  --   < > 14.0  --   --   --  14.5 14.5  --   --   HCT  --   --  40.7  --   --   --  41.9 42.4  --   --   PLT  --   --  162  --   --   --  160 165  --   --   APTT  --   --   --   --  31  --   --   --   --   --   LABPROT  --   --   --   --  13.3  --   --  14.1  --   --   INR  --   --   --   --  1.01  --   --  1.09  --   --   HEPARINUNFRC  --   --   --   --  <0.10*  < > 0.35 0.20* 0.45 0.42  CREATININE  --   --  0.73  --   --   --  0.72  --   --   --   TROPONINI >65.00*  --  45.71* 29.06*  --   --   --   --   --   --   < > = values in this interval not displayed.  Estimated Creatinine Clearance: 111.4 mL/min (by C-G formula based on SCr of 0.72 mg/dL).  Assessment: 66 yo male admitted with STEMI, now found to have left ventricular mural thrombus. Pharmacy consulted for heparin and warfarin dosing and monitoring.  Heparin: 2/7 Heparin initiated at 1400 units/hr 2/9 AM Heparin increased to 1600 units/hr for HL of 0.20 2/9 12N HL 0.45 2/9 1800 HL=0.42 2/10 AM heparin level 0.42. Continue current regimen.  Coumadin: 2/8 INR 1.01  Warfarin 7.5mg  2/9 INR 1.09 2/10 INR 1.11  Warfarin 7.5 mg   Goal of Therapy:  INR 2-3 Heparin level 0.3-0.7 units/ml Monitor platelets by anticoagulation protocol: Yes   Plan:  Will continue with Heparin at current rate of 1600 units/hr and check level in the AM  as this is the second consecutive therapeutic level Will give Warfarin 7.5mg  tonight 2/10 and follow daily INR levels.   Sim Boast, PharmD, BCPS  03/13/16

## 2016-03-12 NOTE — Progress Notes (Signed)
ANTICOAGULATION CONSULT NOTE - Follow Up Consult  Pharmacy Consult for Heparin and Warfarin bridging Indication: left ventricular mural thrombus  No Known Allergies  Patient Measurements: Height: 5\' 11"  (180.3 cm) Weight: 216 lb 4.3 oz (98.1 kg) IBW/kg (Calculated) : 75.3 Heparin Dosing Weight: 95.3 kg  Vital Signs: Temp: 98.2 F (36.8 C) (02/09 0736) Temp Source: Oral (02/09 0736) BP: 108/63 (02/09 0736) Pulse Rate: 55 (02/09 0736)  Labs:  Recent Labs  03/09/16 2244  03/10/16 0527 03/10/16 1112 03/10/16 1644  03/11/16 0348 03/12/16 0419 03/12/16 1155  HGB  --   < > 14.0  --   --   --  14.5 14.5  --   HCT  --   --  40.7  --   --   --  41.9 42.4  --   PLT  --   --  162  --   --   --  160 165  --   APTT  --   --   --   --  31  --   --   --   --   LABPROT  --   --   --   --  13.3  --   --  14.1  --   INR  --   --   --   --  1.01  --   --  1.09  --   HEPARINUNFRC  --   --   --   --  <0.10*  < > 0.35 0.20* 0.45  CREATININE  --   --  0.73  --   --   --  0.72  --   --   TROPONINI >65.00*  --  45.71* 29.06*  --   --   --   --   --   < > = values in this interval not displayed.  Estimated Creatinine Clearance: 111.4 mL/min (by C-G formula based on SCr of 0.72 mg/dL).  Assessment: 66 yo male admitted with STEMI, now found to have left ventricular mural thrombus. Pharmacy consulted for heparin and warfarin dosing and monitoring.  Heparin: 2/7 Heparin initiated at 1400 units/hr 2/9 AM Heparin increased to 1600 units/hr for HL of 0.20 2/9 12N HL 0.45  Coumadin: 2/8 INR 1.01  Warfarin 7.5mg  2/9 INR 1.09     Goal of Therapy:  INR 2-3 Heparin level 0.3-0.7 units/ml Monitor platelets by anticoagulation protocol: Yes   Plan:  Will continue with Heparin at current rate of 1600 units/hr and check confirmation HL in 6 hours and CBC with AM labs. Will give Warfarin 7.5mg  tonight and follow daily INR levels.  Paulina Fusi, PharmD, BCPS 03/12/2016 2:06 PM

## 2016-03-12 NOTE — Progress Notes (Signed)
Patient Name: Edward [name] Date of Encounter: 03/12/2016  Primary Cardiologist: New to [facility] Problem List     Principal Problem:   STEMI (ST elevation myocardial infarction) [facility]) Active Problems:   Ischemic cardiomyopathy   Smoker   Mixed hyperlipidemia   Encounter for anticoagulation discussion and counseling     Subjective   No chest pain or SOB. Wearing LifeVest without any noted alarms or discharges. On heparin gtt while awaiting INR to reach therapeutic level given LV mural thrombus noted on echo. INR 1.09 this morning. Tolerating triple therapy with ASA, Plavix, and Coumadin. Labs stable.    Inpatient Medications    Scheduled Meds: . aspirin  81 mg Oral Daily  . atorvastatin  80 mg Oral q1800  . carvedilol  3.125 mg Oral BID WC  . clopidogrel  75 mg Oral Daily  . famotidine  20 mg Oral BID  . losartan  12.5 mg Oral Daily  . sodium chloride flush  3 mL Intravenous Q12H  . Warfarin - Pharmacist Dosing Inpatient   Does not apply q1800   Continuous Infusions: . sodium chloride 1,000 mL/hr (03/09/16 1337)  . heparin 1,600 Units/hr (03/12/16 0624)   PRN Meds: sodium chloride, acetaminophen **OR** acetaminophen, ondansetron (ZOFRAN) IV, ondansetron **OR** ondansetron (ZOFRAN) IV, sodium chloride flush   Vital Signs    Vitals:   03/11/16 1218 03/11/16 1731 03/12/16 0428 03/12/16 0736  BP: 131/83 114/75 107/70 108/63  Pulse: 60 63 62 (!) 55  Resp: 20 18 16    Temp: 97.7 F (36.5 C) 98 F (36.7 C) 97.8 F (36.6 C) 98.2 F (36.8 C)  TempSrc: Oral Oral  Oral  SpO2: 95% 95% 95% 96%  Weight:      Height:        Intake/Output Summary (Last 24 hours) at 03/12/16 1152 Last data filed at 03/12/16 1030  Gross per 24 hour  Intake           936.67 ml  Output             2475 ml  Net         -1538.33 ml   Filed Weights   03/10/16 0400  Weight: 216 lb 4.3 oz (98.1 kg)    Physical Exam    GEN: Well nourished, well developed, in no acute  distress. Wearing LifeVest without any noted alarms or discharges.  HEENT: Grossly normal.  Neck: Supple, no JVD, carotid bruits, or masses. Cardiac: RRR, no murmurs, rubs, or gallops. No clubbing, cyanosis, edema.  Radials/DP/PT 2+ and equal bilaterally. Right radial cath site healing well. Pulse 2+. Respiratory:  Respirations regular and unlabored, clear to auscultation bilaterally. GI: Soft, nontender, nondistended, BS + x 4. MS: no deformity or atrophy. Skin: warm and dry, no rash. Neuro:  Strength and sensation are intact. Psych: AAOx3.  Normal affect.  Labs    CBC  Recent Labs  03/09/16 1331  03/11/16 0348 03/12/16 0419  WBC 10.5  < > 9.1 8.4  NEUTROABS 8.0*  --   --   --   HGB 15.3  < > 14.5 14.5  HCT 45.8  < > 41.9 42.4  MCV 95.5  < > 94.4 95.6  PLT 195  < > 160 165  < > = values in this interval not displayed. Basic Metabolic Panel  Recent Labs  03/10/16 0527 03/10/16 1112 03/11/16 0348  NA 138  --  138  K 3.5  --  3.8  CL  107  --  108  CO2 26  --  23  GLUCOSE 117*  --  122*  BUN 12  --  12  CREATININE 0.73  --  0.72  CALCIUM 8.3*  --  8.5*  MG  --  1.9  --    Liver Function Tests  Recent Labs  03/09/16 1331  AST 127*  ALT 52  ALKPHOS 45  BILITOT 0.7  PROT 7.5  ALBUMIN 4.5   No results for input(s): LIPASE, AMYLASE in the last 72 hours. Cardiac Enzymes  Recent Labs  03/09/16 2244 03/10/16 0527 03/10/16 1112  TROPONINI >65.00* 45.71* 29.06*   BNP Invalid input(s): POCBNP D-Dimer No results for input(s): DDIMER in the last 72 hours. Hemoglobin A1C  Recent Labs  03/09/16 1708  HGBA1C 5.5   Fasting Lipid Panel  Recent Labs  03/09/16 1708  CHOL 187  HDL 47  LDLCALC 124*  TRIG 79  CHOLHDL 4.0   Thyroid Function Tests No results for input(s): TSH, T4TOTAL, T3FREE, THYROIDAB in the last 72 hours.  Invalid input(s): FREET3  Telemetry    Sinus rhythm - Personally Reviewed  ECG    n/a - Personally Reviewed  Radiology      No results found.  Cardiac Studies   LHC 03/09/2016: Coronary Findings   Dominance: Right  Left Main  Vessel is large. Vessel is angiographically normal.  Left Anterior Descending  Vessel is large. Dist LAD filled by collaterals from RPDA.  Ost LAD lesion, 30% stenosed.  Ost LAD to Mid LAD lesion, 100% stenosed. Culprit lesion. The lesion is type C, located at the bifurcation and thrombotic.  Angioplasty: Lesion length: 32 mm. A STENT RESOLUTE INTEG 3.0X38 drug eluting stent was successfully placed. Minimum lumen area: 3 mm. Stent strut is well apposed. There is no pre-interventional antegrade distal flow (TIMI 0). The post-interventional distal flow is normal (TIMI 3). The intervention was successful . See technique section for details of intervention.  Thrombectomy: Aspiration thrombectomy was performed using a CATH PRIORITY ONE AC 24F. A medium thrombus was removed.  There is no residual stenosis post intervention.  First Diagonal Branch  Vessel is moderate in size. 1st Diag filled by collaterals from Lat 2nd Mrg.  Ost 1st Diag lesion, 100% stenosed.  First Septal Branch  1st Sept filled by collaterals from Inf Sept.  [name] Diagonal Branch  Vessel is moderate in size.  Ost 2nd Diag lesion, 50% stenosed.  Thrombectomy: Aspiration thrombectomy was performed using a CATH PRIORITY ONE AC 24F. A medium thrombus was removed.  There is a 70% residual stenosis post intervention.  Left Circumflex  Vessel is moderate in size. The vessel is tortuous.  Prox Cx to Mid Cx lesion, 40% stenosed.  Mid Cx lesion, 60% stenosed.  First Obtuse Marginal Branch  Vessel is small in size.  [name] Obtuse Marginal Branch  2nd Mrg lesion, 50% stenosed.  Third Obtuse Marginal Branch  Vessel is small in size.  Right Coronary Artery  Vessel is large.  Prox RCA lesion, 40% stenosed.  Dist RCA lesion, 20% stenosed.  Right Posterior Descending Artery  Vessel is small in size.  Right Posterior  Atrioventricular Branch  Vessel is moderate in size.  Post Atrio lesion, 50% stenosed.  Coronary Diagrams   Diagnostic Diagram     Post-Intervention Diagram      Recommendations: 1. Admit to ICU, hospitalist service. 2. Continue ticagrelor infusion x 6 hours. 3. Dual antiplatelet therapy with ASA and ticagrelor for at least 12 months.  4. Cycle troponins until they have peaked, then stop. 5. Obtain transthoracic echocardiogram. 6. Change metoprolol to carvedilol 3.125 mg BID, to be uptitrated as HR and BP allow. 7. Plan to add ACEI before discharge. 8. Close monitoring and counseling regarding extended fluoroscopy time and radiation exposure.   Echo 03/10/2016: Study Conclusions  - Procedure narrative: Transthoracic echocardiography. The study   was technically difficult. Intravenous contrast (Definity) was   administered. - Left ventricle: The cavity size was normal. Wall thickness could   not be accurately determined. Systolic function was moderately to   severely reduced. The estimated ejection fraction was in the   range of 30% to 35%. Doppler parameters are consistent with   abnormal left ventricular relaxation (grade 1 diastolic   dysfunction). Mural thrombus is suspected at the left ventricular   apex. - Left atrium: The atrium was mildly dilated. - Right ventricle: The cavity size was normal. Systolic function   was normal. - Right atrium: The atrium was mildly to moderately dilated. - Pericardium, extracardiac: A trivial pericardial effusion was   identified.  Impressions:  - 1. Moderate to severe LV dysfuction with LAD territory infarct.   LVEF 30-35%.   2. Likely mural thrombus at the left ventricular apex.   3. Limited evaluation of the cardiac valve due to lack of   parasternal windows.   Patient Profile     66 y.o. male with history of HTN who presented to [facility] on 2/6 after presenting to his PCP eariler in the day with chest pain that  began on 2/5 that was similar to chest pain he had 3-4 weeks prior was found to have a late-presenting anterior ST elevation MI s/p IVUS-guided PCI/DES to the proximal and mid LAD as detailed above as well as LV mural thrombus on echo.   Assessment & Plan    1. Anterior ST elevation myocardial infarction: -Late presenting, possibly felt to have had subtotal occlusion in the past with his acute pain beginning last night due to acute plaque rupture and thrombus formation (type I MI). Pain has resolved with PCI. Moderate size D2 was jailed by the stent with ~70% ostial stenosis but TIMI-3 flow. D1 is a moderate-caliber branch that appears chronically occluded. There is also mild to moderate disease involving the LCx and RCA, which will need to be treated medically. -Continue DAPT with ASA 81 mg and Plavix (changed from Brilinta to Plavix upon initiation of triple therapy with Coumadin given LV mural thrombus), likely discontinue ASA vs Plavix in the future with continuation of Coumadin -Echo with reduced EF as above at 30-35%-->wearing LifeVest -Lipitor, Coreg, losartan -If BP allows consider starting spironolactone prior to discharge -Currently chest pain free -Cardiac rehab  2. ICM: -He does not appear volume overloaded at this time -Coreg as above, titrate as tolerated -Echo showed an EF of 30-35%, wearing LifeVest -Will need to optimize HF medications as an outpatient -Will need repeat echo as an outpatient in 90 days to evaluate for improved LVSF. If LVSF remains < 35% will need to see EP for evaluation of ICD -IMay need prn Lasix  3. LV mural thrombus: -On Coumadin/heparin bridge to goal INR of 2-3  4. HTN: -Improved -Continue medications as above  5. HLD: -LDL not at goal -Lipitor as above -Will need follow up lipid and liver in 8 weeks as an outpatient  6. Subclinical hypokalemia: -Replete to 4.0   Signed, Christell Faith, PA-C [facility] Pager: [phone] 03/12/2016, 11:52 AM

## 2016-03-12 NOTE — Progress Notes (Signed)
Patient denies chest pain,no shortness of breath,has life vest on,heparin drip infusing,vital signs within normal limits

## 2016-03-12 NOTE — Progress Notes (Addendum)
ANTICOAGULATION CONSULT NOTE - Follow Up Consult  Pharmacy Consult for Heparin and Warfarin bridging Indication: left ventricular mural thrombus  No Known Allergies  Patient Measurements: Height: 5\' 11"  (180.3 cm) Weight: 216 lb 4.3 oz (98.1 kg) IBW/kg (Calculated) : 75.3 Heparin Dosing Weight: 95.3 kg  Vital Signs: Temp: 97.8 F (36.6 C) (02/09 0428) BP: 107/70 (02/09 0428) Pulse Rate: 62 (02/09 0428)  Labs:  Recent Labs  03/09/16 1331  03/09/16 2244 03/10/16 0527 03/10/16 1112  03/10/16 1644 03/11/16 0011 03/11/16 0348 03/12/16 0419  HGB 15.3  --   --  14.0  --   --   --   --  14.5 14.5  HCT 45.8  --   --  40.7  --   --   --   --  41.9 42.4  PLT 195  --   --  162  --   --   --   --  160 165  APTT 30  --   --   --   --   --  31  --   --   --   LABPROT 12.5  --   --   --   --   --  13.3  --   --  14.1  INR 0.93  --   --   --   --   --  1.01  --   --  1.09  HEPARINUNFRC  --   --   --   --   --   < > <0.10* 0.33 0.35 0.20*  CREATININE 0.92  --   --  0.73  --   --   --   --  0.72  --   TROPONINI 12.03*  < > >65.00* 45.71* 29.06*  --   --   --   --   --   < > = values in this interval not displayed.  Estimated Creatinine Clearance: 111.4 mL/min (by C-G formula based on SCr of 0.72 mg/dL).  Assessment: 66 yo male admitted with STEMI, now found to have left ventricular mural thrombus. Pharmacy consulted for heparin and warfarin dosing and monitoring.  Heparin currently running at 1400 units/hr. To begin warfarin therapy today.  Goal of Therapy:  INR 2-3 Heparin level 0.3-0.7 units/ml Monitor platelets by anticoagulation protocol: Yes   Plan:  Will continue with Heparin at current rate of 1400 units/hr and check HL and CBC with AM labs. Will initiate Warfarin 7.5mg  tonight and follow daily INR levels.  2/9 AM heparin level 0.20. 1400 unit bolus and increase rate to 1600 units/hr. Recheck in 6 hours.  Sim Boast, PharmD, BCPS  03/12/16

## 2016-03-12 NOTE — Progress Notes (Signed)
[place] at [place] NAME: Edward [name]    MR#:  [id number]  DATE OF BIRTH:  [birth year]  SUBJECTIVE:  CHIEF COMPLAINT:  No chief complaint on file.  - anxious about lovenox- now on heparin gtt and coumadin. - feels better, has the Life vest on  REVIEW OF SYSTEMS:  Review of Systems  Constitutional: Negative for chills, fever and malaise/fatigue.  HENT: Negative for ear discharge, ear pain, hearing loss and nosebleeds.   Eyes: Negative for blurred vision and double vision.  Respiratory: Negative for cough, shortness of breath and wheezing.   Cardiovascular: Negative for chest pain, palpitations and leg swelling.  Gastrointestinal: Negative for abdominal pain, constipation, diarrhea, nausea and vomiting.  Genitourinary: Negative for dysuria.  Musculoskeletal: Negative for myalgias.  Neurological: Negative for dizziness, speech change, focal weakness, seizures and headaches.    DRUG ALLERGIES:  No Known Allergies  VITALS:  Blood pressure 108/63, pulse (!) 55, temperature 98.2 F (36.8 C), temperature source Oral, resp. rate 16, height 5\' 11"  (1.803 m), weight 98.1 kg (216 lb 4.3 oz), SpO2 96 %.  PHYSICAL EXAMINATION:  Physical Exam  GENERAL:  66 y.o.-year-old patient lying in the bed with no acute distress.  EYES: Pupils equal, round, reactive to light and accommodation. No scleral icterus. Extraocular muscles intact.  HEENT: Head atraumatic, normocephalic. Oropharynx and nasopharynx clear.  NECK:  Supple, no jugular venous distention. No thyroid enlargement, no tenderness.  LUNGS: Normal breath sounds bilaterally, no wheezing, rales,rhonchi or crepitation. No use of accessory muscles of respiration.  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 S1, S2 normal. No murmurs, rubs, or gallops.  ABDOMEN: Soft, nontender, nondistended. Bowel sounds present. No organomegaly or mass.  EXTREMITIES: No pedal  edema, cyanosis, or clubbing.  NEUROLOGIC: Cranial nerves II through XII are intact. Muscle strength 5/5 in all extremities. Sensation intact. Gait not checked.  PSYCHIATRIC: The patient is alert and oriented x 3.  SKIN: No obvious rash, lesion, or ulcer.    LABORATORY PANEL:   CBC  Recent Labs Lab 03/12/16 0419  WBC 8.4  HGB 14.5  HCT 42.4  PLT 165   ------------------------------------------------------------------------------------------------------------------  Chemistries   Recent Labs Lab 03/09/16 1331  03/10/16 1112 03/11/16 0348  NA 142  < >  --  138  K 4.7  < >  --  3.8  CL 105  < >  --  108  CO2 31  < >  --  23  GLUCOSE 123*  < >  --  122*  BUN 16  < >  --  12  CREATININE 0.92  < >  --  0.72  CALCIUM 9.2  < >  --  8.5*  MG  --   --  1.9  --   AST 127*  --   --   --   ALT 52  --   --   --   ALKPHOS 45  --   --   --   BILITOT 0.7  --   --   --   < > = values in this interval not displayed. ------------------------------------------------------------------------------------------------------------------  Cardiac Enzymes  Recent Labs Lab 03/10/16 1112  TROPONINI 29.06*   ------------------------------------------------------------------------------------------------------------------  RADIOLOGY:  No results found.  EKG:   Orders placed or performed during the hospital encounter of 03/09/16  . EKG 12-Lead  . EKG 12-Lead  . ED EKG  . ED EKG  . EKG 12-Lead immediately post procedure  .  EKG 12-Lead  . EKG 12-Lead immediately post procedure  . EKG 12-Lead  . EKG 12-Lead  . EKG 12-Lead    ASSESSMENT AND PLAN:   Ronzell Cannizzaro  is a 66 y.o. male with a known history of Anxiety, BPH, hypertension presents from home secondary to onset of chest pain and difficulty breathing- noted to have STEMI.  #1 STEMI- appreciate cardiology consult, s/p cardiac catheterization and LAD drug eluting stent placed - on asa, plavix, on coreg, loasrtan and statin -  ECHO with EF at 30% - prn nitro  #2 LV thrombus- noted on ECHO -started on heparin drip and coumadin, INR at 1.09 today - continue until goal INR of 2-3 reached  #3 Ischemic cardiomyopathy- secondary to STEMI, EF noted to be 30% On life vest. Repeat ECHO in 6-8 weeks after discharge - on coreg and losartan added  #4 Hyperlipidemia- statin  #4 DVT prophylaxis- Lovenox   Ambulate today   All the records are reviewed and case discussed with Care Management/Social Workerr. Management plans discussed with the patient, family and they are in agreement.  CODE STATUS: Full Code  TOTAL TIME TAKING CARE OF THIS PATIENT: 33 minutes.   POSSIBLE D/C IN 1-2 DAYS, DEPENDING ON CLINICAL CONDITION.   Amarylis Rovito M.D on 03/12/2016 at 8:35 AM  Between 7am to 6pm - Pager - [phone]  After 6pm go to [email or web address] - Proofreader  [facility]  Office  [phone]  CC: Primary care physician; Vernie Murders, PA

## 2016-03-13 LAB — BASIC METABOLIC PANEL
Anion gap: 7 (ref 5–15)
BUN: 16 mg/dL (ref 6–20)
CHLORIDE: 108 mmol/L (ref 101–111)
CO2: 23 mmol/L (ref 22–32)
Calcium: 8.6 mg/dL — ABNORMAL LOW (ref 8.9–10.3)
Creatinine, Ser: 0.88 mg/dL (ref 0.61–1.24)
GFR calc Af Amer: >60 mL/min (ref >60)
GFR calc non Af Amer: >60 mL/min (ref >60)
GLUCOSE: 110 mg/dL — AB (ref 65–99)
POTASSIUM: 4.2 mmol/L (ref 3.5–5.1)
Sodium: 138 mmol/L (ref 135–145)

## 2016-03-13 LAB — CBC
HEMATOCRIT: 42.8 % (ref 40.0–52.0)
HEMOGLOBIN: 14.3 g/dL (ref 13.0–18.0)
MCH: 32.2 pg (ref 26.0–34.0)
MCHC: 33.5 g/dL (ref 32.0–36.0)
MCV: 96.1 fL (ref 80.0–100.0)
Platelets: 168 10*3/uL (ref 150–440)
RBC: 4.45 MIL/uL (ref 4.40–5.90)
RDW: 13.3 % (ref 11.5–14.5)
WBC: 8.5 10*3/uL (ref 3.8–10.6)

## 2016-03-13 LAB — PROTIME-INR
INR: 1.11
PROTHROMBIN TIME: 14.4 s (ref 11.4–15.2)

## 2016-03-13 LAB — HEPARIN LEVEL (UNFRACTIONATED): HEPARIN UNFRACTIONATED: 0.42 [IU]/mL (ref 0.30–0.70)

## 2016-03-13 MED ORDER — WARFARIN SODIUM 5 MG PO TABS
7.5000 mg | ORAL_TABLET | Freq: Once | ORAL | Status: AC
  Administered 2016-03-13: 7.5 mg via ORAL
  Filled 2016-03-13: qty 2

## 2016-03-13 MED ORDER — CEPASTAT 14.5 MG MT LOZG
1.0000 | LOZENGE | OROMUCOSAL | Status: DC | PRN
  Administered 2016-03-14: 1 via BUCCAL
  Filled 2016-03-13: qty 9

## 2016-03-13 NOTE — Progress Notes (Signed)
[place] at [place] NAME: Edward [name]    MR#:  [id number]  DATE OF BIRTH:  1950/11/07  SUBJECTIVE:  CHIEF COMPLAINT:  No chief complaint on file.  - INR at 1.1, feels great - ambulating well, 1 episode of diaphoresis last night  REVIEW OF SYSTEMS:  Review of Systems  Constitutional: Positive for diaphoresis. Negative for chills, fever and malaise/fatigue.  HENT: Negative for ear discharge, ear pain, hearing loss and nosebleeds.   Eyes: Negative for blurred vision and double vision.  Respiratory: Negative for cough, shortness of breath and wheezing.   Cardiovascular: Negative for chest pain, palpitations and leg swelling.  Gastrointestinal: Negative for abdominal pain, constipation, diarrhea, nausea and vomiting.  Genitourinary: Negative for dysuria.  Musculoskeletal: Negative for myalgias.  Neurological: Negative for dizziness, speech change, focal weakness, seizures and headaches.    DRUG ALLERGIES:  No Known Allergies  VITALS:  Blood pressure (!) 92/55, pulse 61, temperature 97.8 F (36.6 C), temperature source Oral, resp. rate 16, height 5\' 11"  (1.803 m), weight 98.1 kg (216 lb 4.3 oz), SpO2 95 %.  PHYSICAL EXAMINATION:  Physical Exam  GENERAL:  66 y.o.-year-old patient lying in the bed with no acute distress.  EYES: Pupils equal, round, reactive to light and accommodation. No scleral icterus. Extraocular muscles intact.  HEENT: Head atraumatic, normocephalic. Oropharynx and nasopharynx clear.  NECK:  Supple, no jugular venous distention. No thyroid enlargement, no tenderness.  LUNGS: Normal breath sounds bilaterally, no wheezing, rales,rhonchi or crepitation. No use of accessory muscles of respiration.  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 S1, S2 normal. No murmurs, rubs, or gallops.  ABDOMEN: Soft, nontender, nondistended. Bowel sounds present. No organomegaly or mass.    EXTREMITIES: No pedal edema, cyanosis, or clubbing.  NEUROLOGIC: Cranial nerves II through XII are intact. Muscle strength 5/5 in all extremities. Sensation intact. Gait not checked.  PSYCHIATRIC: The patient is alert and oriented x 3.  SKIN: No obvious rash, lesion, or ulcer.    LABORATORY PANEL:   CBC  Recent Labs Lab 03/13/16 0459  WBC 8.5  HGB 14.3  HCT 42.8  PLT 168   ------------------------------------------------------------------------------------------------------------------  Chemistries   Recent Labs Lab 03/09/16 1331  03/10/16 1112  03/13/16 0459  NA 142  < >  --   < > 138  K 4.7  < >  --   < > 4.2  CL 105  < >  --   < > 108  CO2 31  < >  --   < > 23  GLUCOSE 123*  < >  --   < > 110*  BUN 16  < >  --   < > 16  CREATININE 0.92  < >  --   < > 0.88  CALCIUM 9.2  < >  --   < > 8.6*  MG  --   --  1.9  --   --   AST 127*  --   --   --   --   ALT 52  --   --   --   --   ALKPHOS 45  --   --   --   --   BILITOT 0.7  --   --   --   --   < > = values in this interval not displayed. ------------------------------------------------------------------------------------------------------------------  Cardiac Enzymes  Recent Labs Lab 03/10/16 1112  TROPONINI 29.06*   ------------------------------------------------------------------------------------------------------------------  RADIOLOGY:  No results found.  EKG:   Orders placed or performed during the hospital encounter of 03/09/16  . EKG 12-Lead  . EKG 12-Lead  . ED EKG  . ED EKG  . EKG 12-Lead immediately post procedure  . EKG 12-Lead  . EKG 12-Lead immediately post procedure  . EKG 12-Lead  . EKG 12-Lead  . EKG 12-Lead    ASSESSMENT AND PLAN:   Edward [name]  is a 66 y.o. male with a known history of Anxiety, BPH, hypertension presents from home secondary to onset of chest pain and difficulty breathing- noted to have STEMI.  #1 STEMI- appreciate cardiology consult, s/p cardiac  catheterization and LAD drug eluting stent placed - on asa, plavix, on coreg, loasrtan and statin - ECHO with EF at 30% - prn nitro  #2 LV thrombus- noted on ECHO -on heparin drip and coumadin, INR at 1.1 today - continue until goal INR of 2-3 reached  #3 Ischemic cardiomyopathy- secondary to STEMI, EF noted to be 30% On life vest. Repeat ECHO in 6-8 weeks after discharge - on coreg and losartan added  #4 Hyperlipidemia- statin  #4 DVT prophylaxis- Lovenox  Ambulating well.  Discharge once INR is therapeutic   All the records are reviewed and case discussed with Care Management/Social Workerr. Management plans discussed with the patient, family and they are in agreement.  CODE STATUS: Full Code  TOTAL TIME TAKING CARE OF THIS PATIENT: 33 minutes.   POSSIBLE D/C IN 1-2 DAYS, DEPENDING ON CLINICAL CONDITION.   [name] M.D on 03/13/2016 at 11:24 AM  Between 7am to 6pm - Pager - [phone]  After 6pm go to [email or web address] - Proofreader  [facility]  Office  [phone]  CC: Primary care physician; Vernie Murders, PA

## 2016-03-13 NOTE — Progress Notes (Deleted)
ANTICOAGULATION CONSULT NOTE - Follow Up Consult  Pharmacy Consult for Heparin and Warfarin bridging Indication: left ventricular mural thrombus  No Known Allergies  Patient Measurements: Height: 5\' 11"  (180.3 cm) Weight: 216 lb 4.3 oz (98.1 kg) IBW/kg (Calculated) : 75.3 Heparin Dosing Weight: 95.3 kg  Vital Signs: Temp: 98.4 F (36.9 C) (02/10 0828) Temp Source: Oral (02/10 0828) BP: 93/67 (02/10 0828) Pulse Rate: 61 (02/10 0828)  Labs:  Recent Labs  03/10/16 1112 03/10/16 1644  03/11/16 0348 03/12/16 0419 03/12/16 1155 03/12/16 1801 03/13/16 0459  HGB  --   --   < > 14.5 14.5  --   --  14.3  HCT  --   --   --  41.9 42.4  --   --  42.8  PLT  --   --   --  160 165  --   --  168  APTT  --  31  --   --   --   --   --   --   LABPROT  --  13.3  --   --  14.1  --   --  14.4  INR  --  1.01  --   --  1.09  --   --  1.11  HEPARINUNFRC  --  <0.10*  < > 0.35 0.20* 0.45 0.42 0.42  CREATININE  --   --   --  0.72  --   --   --  0.88  TROPONINI 29.06*  --   --   --   --   --   --   --   < > = values in this interval not displayed.  Estimated Creatinine Clearance: 101.2 mL/min (by C-G formula based on SCr of 0.88 mg/dL).  Assessment: 66 yo male admitted with STEMI, now found to have left ventricular mural thrombus. Pharmacy consulted for heparin and warfarin dosing and monitoring.  Heparin: 2/7 Heparin initiated at 1400 units/hr 2/9 AM Heparin increased to 1600 units/hr for HL of 0.20 2/9 12N HL 0.45  Coumadin: 2/8 INR 1.01  Warfarin 7.5mg  2/9 INR 1.09; warfarin 7.5  2/10: INR 1.11   Goal of Therapy:  INR 2-3 Heparin level 0.3-0.7 units/ml Monitor platelets by anticoagulation protocol: Yes   Plan:  Will continue with Heparin at current rate of 1600 units/hr and check confirmation HL in 6 hours and CBC with AM labs. Will give Warfarin 10 mg tonight and follow daily INR levels.  Larene Beach, PharmD  03/13/2016 10:38 AM

## 2016-03-13 NOTE — Progress Notes (Signed)
Patient is complaining of sore throat. Dr. Jannifer Franklin notified and received a new order for Lozenge buccal as needed.  No acute distress noted. Will continue to monitor.

## 2016-03-13 NOTE — Progress Notes (Signed)
Patient: Edward [name] / Admit Date: 03/09/2016 / Date of Encounter: 03/13/2016, 12:29 PM   Hospital Problem List     Principal Problem:   STEMI (ST elevation myocardial infarction) ([place]) Active Problems:   Ischemic cardiomyopathy   Smoker   Mixed hyperlipidemia   Encounter for anticoagulation discussion and counseling     Subjective   Reports that he feels well, denies any chest pain or shortness of breath on exertion Walked around the hospital unit yesterday 6 times Head sweating of his head and neck when he woke up overnight, turned the temperature down in his room, etiology unclear Tolerating heparin bridge, warfarin, aspirin and Plavix  Inpatient Medications    Scheduled Meds: . aspirin  81 mg Oral Daily  . atorvastatin  80 mg Oral q1800  . carvedilol  3.125 mg Oral BID WC  . clopidogrel  75 mg Oral Daily  . famotidine  20 mg Oral BID  . losartan  12.5 mg Oral Daily  . sodium chloride flush  3 mL Intravenous Q12H  . warfarin  7.5 mg Oral ONCE-1800  . Warfarin - Pharmacist Dosing Inpatient   Does not apply q1800   Continuous Infusions: . sodium chloride 1,000 mL/hr (03/09/16 1337)  . heparin 1,600 Units/hr (03/13/16 0247)   PRN Meds: sodium chloride, acetaminophen **OR** acetaminophen, ondansetron (ZOFRAN) IV, ondansetron **OR** ondansetron (ZOFRAN) IV, sodium chloride flush   Objective: Telemetry:  Physical Exam: Blood pressure (!) 92/55, pulse 61, temperature 97.8 F (36.6 C), temperature source Oral, resp. rate 16, height 5\' 11"  (1.803 m), weight 216 lb 4.3 oz (98.1 kg), SpO2 95 %. Body mass index is 30.16 kg/m. GEN: Well nourished, well developed, in no acute distress.  HEENT: Grossly normal.  Neck: Supple, no JVD, carotid bruits, or masses. Cardiac: RRR, no murmurs, rubs, or gallops. No clubbing, cyanosis, edema.  Radials/DP/PT 2+ and equal bilaterally.  Respiratory:  Respirations regular and unlabored, clear to auscultation bilaterally. GI: Soft,  nontender, nondistended, BS + x 4. MS: no deformity or atrophy. Skin: warm and dry, no rash. Neuro:  Strength and sensation are intact. Psych: AAOx3.  Normal affect.   Intake/Output Summary (Last 24 hours) at 03/13/16 1229 Last data filed at 03/13/16 1152  Gross per 24 hour  Intake             1344 ml  Output             2000 ml  Net             -656 ml     Labs: CBC  Recent Labs  03/12/16 0419 03/13/16 0459  WBC 8.4 8.5  HGB 14.5 14.3  HCT 42.4 42.8  MCV 95.6 96.1  PLT 165 [id number]   Basic Metabolic Panel  Recent Labs  03/11/16 0348 03/13/16 0459  NA 138 138  K 3.8 4.2  CL 108 108  CO2 23 23  GLUCOSE 122* 110*  BUN 12 16  CREATININE 0.72 0.88  CALCIUM 8.5* 8.6*   Liver Function Tests No results for input(s): AST, ALT, ALKPHOS, BILITOT, PROT, ALBUMIN in the last 72 hours. No results for input(s): LIPASE, AMYLASE in the last 72 hours. Cardiac Enzymes No results for input(s): CKTOTAL, CKMB, CKMBINDEX, TROPONINI in the last 72 hours. BNP Invalid input(s): POCBNP D-Dimer No results for input(s): DDIMER in the last 72 hours. Hemoglobin A1C No results for input(s): HGBA1C in the last 72 hours. Fasting Lipid Panel No results for input(s): CHOL, HDL, LDLCALC, TRIG, CHOLHDL,  LDLDIRECT in the last 72 hours. Thyroid Function Tests No results for input(s): TSH, T4TOTAL, T3FREE, THYROIDAB in the last 72 hours.  Invalid input(s): FREET3  Weights: Filed Weights   03/10/16 0400  Weight: 216 lb 4.3 oz (98.1 kg)     Radiology/Studies:  No results found.   Assessment and Plan  66 y.o. male  taken for emergent left heart catheterization demonstrating occlusion of the proximal LAD as well as several diagonal branches. He underwent successful PCI to the proximal and mid LAD with a single drug-eluting stent.   Details as below Thrombotic occlusion of the proximal/mid LAD involving D1 and D2. Mild to moderate, non-obstructive CAD involving LCx and RCA. Successful  IVUS-guided PCI to LAD proximal and mid LAD using an Integrity Resolute 3.0 x 38 mm drug-eluting stent (post-dilated up to 4.0 mm) with 0% residual stenosis and TIMI-flow. D2 was jailed by the LAD stent resulting in 70% ostial stenosis with TIMI-3 flow.  -----On  heparin infusion bridge to warfarin (he refused lovenox based on bad experience in the past, mother with severe bleeding on lovenox) On asa and clopidogrel   Plan is for At least one month of triple therapy,  after which time discontinuation of aspirin can be considered.  continue with low-dose carvedilol and  ACE inhibitor  Given severe LV dysfunction, STEMI, LAD distribution, He is currently wearing a LifeVest   Discharge once INR 2 or greater   Total encounter time more than 25 minutes  Greater than 50% was spent in counseling and coordination of care with the patient   Signed, Esmond Plants, MD, Ph.D. [facility] 03/13/2016, 12:29 PM

## 2016-03-14 DIAGNOSIS — A0472 Enterocolitis due to Clostridium difficile, not specified as recurrent: Secondary | ICD-10-CM

## 2016-03-14 LAB — CBC
HEMATOCRIT: 43.5 % (ref 40.0–52.0)
Hemoglobin: 15.2 g/dL (ref 13.0–18.0)
MCH: 32.8 pg (ref 26.0–34.0)
MCHC: 34.9 g/dL (ref 32.0–36.0)
MCV: 94 fL (ref 80.0–100.0)
PLATELETS: 187 10*3/uL (ref 150–440)
RBC: 4.63 MIL/uL (ref 4.40–5.90)
RDW: 13.4 % (ref 11.5–14.5)
WBC: 8.4 10*3/uL (ref 3.8–10.6)

## 2016-03-14 LAB — GASTROINTESTINAL PANEL BY PCR, STOOL (REPLACES STOOL CULTURE)
ADENOVIRUS F40/41: NOT DETECTED
ASTROVIRUS: NOT DETECTED
Campylobacter species: NOT DETECTED
Cryptosporidium: NOT DETECTED
Cyclospora cayetanensis: NOT DETECTED
ENTAMOEBA HISTOLYTICA: NOT DETECTED
ENTEROAGGREGATIVE E COLI (EAEC): NOT DETECTED
ENTEROPATHOGENIC E COLI (EPEC): DETECTED — AB
ENTEROTOXIGENIC E COLI (ETEC): NOT DETECTED
GIARDIA LAMBLIA: NOT DETECTED
NOROVIRUS GI/GII: NOT DETECTED
Plesimonas shigelloides: NOT DETECTED
ROTAVIRUS A: NOT DETECTED
SALMONELLA SPECIES: NOT DETECTED
SHIGELLA/ENTEROINVASIVE E COLI (EIEC): NOT DETECTED
Sapovirus (I, II, IV, and V): NOT DETECTED
Shiga like toxin producing E coli (STEC): NOT DETECTED
VIBRIO CHOLERAE: NOT DETECTED
Vibrio species: NOT DETECTED
Yersinia enterocolitica: NOT DETECTED

## 2016-03-14 LAB — PROTIME-INR
INR: 1.23
Prothrombin Time: 15.6 seconds — ABNORMAL HIGH (ref 11.4–15.2)

## 2016-03-14 LAB — CLOSTRIDIUM DIFFICILE BY PCR: Toxigenic C. Difficile by PCR: POSITIVE — AB

## 2016-03-14 LAB — HEPARIN LEVEL (UNFRACTIONATED): Heparin Unfractionated: 0.42 IU/mL (ref 0.30–0.70)

## 2016-03-14 LAB — C DIFFICILE QUICK SCREEN W PCR REFLEX
C DIFFICILE (CDIFF) TOXIN: NEGATIVE
C Diff antigen: POSITIVE — AB

## 2016-03-14 MED ORDER — CIPROFLOXACIN HCL 500 MG PO TABS
500.0000 mg | ORAL_TABLET | Freq: Two times a day (BID) | ORAL | Status: DC
  Administered 2016-03-14 – 2016-03-19 (×11): 500 mg via ORAL
  Filled 2016-03-14 (×11): qty 1

## 2016-03-14 MED ORDER — WARFARIN SODIUM 5 MG PO TABS
5.0000 mg | ORAL_TABLET | Freq: Once | ORAL | Status: AC
  Administered 2016-03-14: 5 mg via ORAL
  Filled 2016-03-14: qty 1

## 2016-03-14 MED ORDER — METRONIDAZOLE 500 MG PO TABS
500.0000 mg | ORAL_TABLET | Freq: Three times a day (TID) | ORAL | Status: DC
  Administered 2016-03-14 – 2016-03-19 (×16): 500 mg via ORAL
  Filled 2016-03-14 (×16): qty 1

## 2016-03-14 MED ORDER — WARFARIN SODIUM 5 MG PO TABS: 10.0000 mg | ORAL_TABLET | Freq: Once | ORAL | Status: DC

## 2016-03-14 NOTE — Progress Notes (Signed)
[place] at [place] NAME: Edward [name]    MR#:  [id number]  DATE OF BIRTH:  [birth year]  SUBJECTIVE:  CHIEF COMPLAINT:  No chief complaint on file.  - INR at 1.23 today - complains of diarrhea and stomach upset since last night  REVIEW OF SYSTEMS:  Review of Systems  Constitutional: Negative for chills, diaphoresis, fever and malaise/fatigue.  HENT: Negative for ear discharge, ear pain, hearing loss and nosebleeds.   Eyes: Negative for blurred vision and double vision.  Respiratory: Negative for cough, shortness of breath and wheezing.   Cardiovascular: Negative for chest pain, palpitations and leg swelling.  Gastrointestinal: Positive for diarrhea. Negative for abdominal pain, constipation, nausea and vomiting.  Genitourinary: Negative for dysuria.  Musculoskeletal: Negative for myalgias.  Neurological: Negative for dizziness, speech change, focal weakness, seizures and headaches.    DRUG ALLERGIES:  No Known Allergies  VITALS:  Blood pressure 103/60, pulse (!) 57, temperature 97.9 F (36.6 C), temperature source Oral, resp. rate 18, height 5\' 11"  (1.803 m), weight 98.1 kg (216 lb 4.3 oz), SpO2 95 %.  PHYSICAL EXAMINATION:  Physical Exam  GENERAL:  66 y.o.-year-old patient lying in the bed with no acute distress.  EYES: Pupils equal, round, reactive to light and accommodation. No scleral icterus. Extraocular muscles intact.  HEENT: Head atraumatic, normocephalic. Oropharynx and nasopharynx clear.  NECK:  Supple, no jugular venous distention. No thyroid enlargement, no tenderness.  LUNGS: Normal breath sounds bilaterally, no wheezing, rales,rhonchi or crepitation. No use of accessory muscles of respiration.  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 S1, S2 normal. No murmurs, rubs, or gallops.  ABDOMEN: Soft, nontender, nondistended. Bowel sounds present. No organomegaly or mass.    EXTREMITIES: No pedal edema, cyanosis, or clubbing.  NEUROLOGIC: Cranial nerves II through XII are intact. Muscle strength 5/5 in all extremities. Sensation intact. Gait not checked.  PSYCHIATRIC: The patient is alert and oriented x 3.  SKIN: No obvious rash, lesion, or ulcer.    LABORATORY PANEL:   CBC  Recent Labs Lab 03/14/16 0522  WBC 8.4  HGB 15.2  HCT 43.5  PLT 187   ------------------------------------------------------------------------------------------------------------------  Chemistries   Recent Labs Lab 03/09/16 1331  03/10/16 1112  03/13/16 0459  NA 142  < >  --   < > 138  K 4.7  < >  --   < > 4.2  CL 105  < >  --   < > 108  CO2 31  < >  --   < > 23  GLUCOSE 123*  < >  --   < > 110*  BUN 16  < >  --   < > 16  CREATININE 0.92  < >  --   < > 0.88  CALCIUM 9.2  < >  --   < > 8.6*  MG  --   --  1.9  --   --   AST 127*  --   --   --   --   ALT 52  --   --   --   --   ALKPHOS 45  --   --   --   --   BILITOT 0.7  --   --   --   --   < > = values in this interval not displayed. ------------------------------------------------------------------------------------------------------------------  Cardiac Enzymes  Recent Labs Lab 03/10/16 1112  TROPONINI 29.06*   ------------------------------------------------------------------------------------------------------------------  RADIOLOGY:  No results found.  EKG:   Orders placed or performed during the hospital encounter of 03/09/16  . EKG 12-Lead  . EKG 12-Lead  . ED EKG  . ED EKG  . EKG 12-Lead immediately post procedure  . EKG 12-Lead  . EKG 12-Lead immediately post procedure  . EKG 12-Lead  . EKG 12-Lead  . EKG 12-Lead    ASSESSMENT AND PLAN:   Edward [name]  is a 66 y.o. male with a known history of Anxiety, BPH, hypertension presents from home secondary to onset of chest pain and difficulty breathing- noted to have STEMI.  #1 STEMI- appreciate cardiology consult, s/p cardiac  catheterization and LAD drug eluting stent placed - on asa, plavix, on coreg, loasrtan and statin - ECHO with EF at 30% - prn nitro  #2 LV thrombus- noted on ECHO -on heparin drip and coumadin, INR at 1.2 today - continue until goal INR of 2-3 reached  #3 Ischemic cardiomyopathy- secondary to STEMI, EF noted to be 30% On life vest. Repeat ECHO in 6-8 weeks after discharge - on coreg and losartan added  #4 Diarrhea- send stool studies. If negative, add imodium  #4 DVT prophylaxis- Lovenox  Ambulating well.  Discharge once INR is therapeutic   All the records are reviewed and case discussed with Care Management/Social Workerr. Management plans discussed with the patient, family and they are in agreement.  CODE STATUS: Full Code  TOTAL TIME TAKING CARE OF THIS PATIENT: 33 minutes.   POSSIBLE D/C IN 1-2 DAYS, DEPENDING ON CLINICAL CONDITION.   Gladstone Lighter M.D on 03/14/2016 at 8:31 AM  Between 7am to 6pm - Pager - [phone]  After 6pm go to [email or web address] - Proofreader  [facility]  Office  [phone]  CC: Primary care physician; Vernie Murders, PA

## 2016-03-14 NOTE — Progress Notes (Signed)
Patient: Edward [name] / Admit Date: 03/09/2016 / Date of Encounter: 03/14/2016, 7:07 PM   Hospital Problem List     Principal Problem:   STEMI (ST elevation myocardial infarction) ([place]) Active Problems:   Ischemic cardiomyopathy   Smoker   Mixed hyperlipidemia   Encounter for anticoagulation discussion and counseling     Subjective   He developed severe dominant discomfort, diarrhea yesterday afternoon and overnight Somewhat better this morning Stool culture obtained, positive for C. Difficile Discussed with wife in the room today Now on precautions Denies any significant shortness of breath, chest pain, otherwise feels well apart from GI issues   Inpatient Medications    Scheduled Meds: . aspirin  81 mg Oral Daily  . atorvastatin  80 mg Oral q1800  . carvedilol  3.125 mg Oral BID WC  . ciprofloxacin  500 mg Oral BID  . clopidogrel  75 mg Oral Daily  . famotidine  20 mg Oral BID  . losartan  12.5 mg Oral Daily  . metroNIDAZOLE  500 mg Oral Q8H  . sodium chloride flush  3 mL Intravenous Q12H  . Warfarin - Pharmacist Dosing Inpatient   Does not apply q1800   Continuous Infusions: . sodium chloride 1,000 mL/hr (03/09/16 1337)  . heparin 1,600 Units/hr (03/14/16 1625)   PRN Meds: sodium chloride, acetaminophen **OR** acetaminophen, ondansetron (ZOFRAN) IV, ondansetron **OR** ondansetron (ZOFRAN) IV, phenol-menthol, sodium chloride flush   Objective: Telemetry:  Physical Exam: Blood pressure 115/62, pulse 65, temperature 97.9 F (36.6 C), temperature source Oral, resp. rate 20, height 5\' 11"  (1.803 m), weight 216 lb 4.3 oz (98.1 kg), SpO2 95 %. Body mass index is 30.16 kg/m. GEN: Well nourished, well developed, in no acute distress.  HEENT: Grossly normal.  Neck: Supple, no JVD, carotid bruits, or masses. Cardiac: RRR, no murmurs, rubs, or gallops. No clubbing, cyanosis, edema.  Radials/DP/PT 2+ and equal bilaterally.  Respiratory:  Respirations regular and  unlabored, clear to auscultation bilaterally. GI: Soft, nontender, nondistended, BS + x 4.Hyperactive bowel sounds MS: no deformity or atrophy. Skin: warm and dry, no rash. Neuro:  Strength and sensation are intact. Psych: AAOx3.  Normal affect.   Intake/Output Summary (Last 24 hours) at 03/14/16 1907 Last data filed at 03/14/16 1825  Gross per 24 hour  Intake             1104 ml  Output              600 ml  Net              504 ml     Labs: CBC  Recent Labs  03/13/16 0459 03/14/16 0522  WBC 8.5 8.4  HGB 14.3 15.2  HCT 42.8 43.5  MCV 96.1 94.0  PLT 168 [id number]   Basic Metabolic Panel  Recent Labs  03/13/16 0459  NA 138  K 4.2  CL 108  CO2 23  GLUCOSE 110*  BUN 16  CREATININE 0.88  CALCIUM 8.6*   Liver Function Tests No results for input(s): AST, ALT, ALKPHOS, BILITOT, PROT, ALBUMIN in the last 72 hours. No results for input(s): LIPASE, AMYLASE in the last 72 hours. Cardiac Enzymes No results for input(s): CKTOTAL, CKMB, CKMBINDEX, TROPONINI in the last 72 hours. BNP Invalid input(s): POCBNP D-Dimer No results for input(s): DDIMER in the last 72 hours. Hemoglobin A1C No results for input(s): HGBA1C in the last 72 hours. Fasting Lipid Panel No results for input(s): CHOL, HDL, LDLCALC, TRIG, CHOLHDL, LDLDIRECT in  the last 72 hours. Thyroid Function Tests No results for input(s): TSH, T4TOTAL, T3FREE, THYROIDAB in the last 72 hours.  Invalid input(s): FREET3  Weights: Filed Weights   03/10/16 0400  Weight: 216 lb 4.3 oz (98.1 kg)     Radiology/Studies:  No results found.   Assessment and Plan  66 y.o. male  taken for emergent left heart catheterization demonstrating occlusion of the proximal LAD as well as several diagonal branches. He underwent successful PCI to the proximal and mid LAD with a single drug-eluting stent.   Details as below Thrombotic occlusion of the proximal/mid LAD involving D1 and D2. Mild to moderate, non-obstructive CAD  involving LCx and RCA. Successful IVUS-guided PCI to LAD proximal and mid LAD using an Integrity Resolute 3.0 x 38 mm drug-eluting stent (post-dilated up to 4.0 mm) with 0% residual stenosis and TIMI-flow. D2 was jailed by the LAD stent resulting in 70% ostial stenosis with TIMI-3 flow.  -----On heparin infusion bridge to warfarin  On asa and clopidogrel   Plan is for At least one month of triple therapy,  after which time discontinuation of aspirin can be considered.  continue with low-dose carvedilol and  ACE inhibitor  Given severe LV dysfunction, STEMI, LAD distribution, He is currently wearing a LifeVest   -------> new issues in addition to above which have not changed include new diagnosis of C. Difficile He has been started on Flagyl, Cipro, now on contact precautions Long discussion with him concerning how he may have contracted the infection Recently cut back from [place], evening after he arrived back he developed STEMI was taken to the hospital  Consider Discharge once INR 2 or greater  Signed, Esmond Plants, MD, Ph.D. [facility] 03/14/2016, 7:07 PM

## 2016-03-14 NOTE — Progress Notes (Addendum)
ANTICOAGULATION CONSULT NOTE - Follow Up Consult  Pharmacy Consult for Heparin and Warfarin bridging Indication: left ventricular mural thrombus  No Known Allergies  Patient Measurements: Height: 5\' 11"  (180.3 cm) Weight: 216 lb 4.3 oz (98.1 kg) IBW/kg (Calculated) : 75.3 Heparin Dosing Weight: 95.3 kg  Vital Signs: Temp: 98.3 F (36.8 C) (02/11 0402) Temp Source: Oral (02/11 0402) BP: 114/72 (02/11 0402) Pulse Rate: 66 (02/11 0402)  Labs:  Recent Labs  03/12/16 0419  03/12/16 1801 03/13/16 0459 03/14/16 0522  HGB 14.5  --   --  14.3 15.2  HCT 42.4  --   --  42.8 43.5  PLT 165  --   --  168 187  LABPROT 14.1  --   --  14.4 15.6*  INR 1.09  --   --  1.11 1.23  HEPARINUNFRC 0.20*  < > 0.42 0.42 0.42  CREATININE  --   --   --  0.88  --   < > = values in this interval not displayed.  Estimated Creatinine Clearance: 101.2 mL/min (by C-G formula based on SCr of 0.88 mg/dL).  Assessment: 66 yo male admitted with STEMI, now found to have left ventricular mural thrombus. Pharmacy consulted for heparin and warfarin dosing and monitoring.  Heparin: 2/7 Heparin initiated at 1400 units/hr 2/9 AM Heparin increased to 1600 units/hr for HL of 0.20 2/9 12N HL 0.45 2/9 1800 HL=0.42 2/10 AM heparin level 0.42. Continue current regimen. 2/11 AM heparin level 0.42. Continue current regimen.  Coumadin: 2/8 INR 1.01  Warfarin 7.5mg  2/9 INR 1.09 2/10 INR 1.11  Warfarin 7.5 mg 2.11 INR 1.23 10 mg warfarin  Goal of Therapy:  INR 2-3 Heparin level 0.3-0.7 units/ml Monitor platelets by anticoagulation protocol: Yes   Plan:  Will continue with Heparin at current rate of 1600 units/hr and check level in the AM. Will give Warfarin 10mg  tonight 2/11 and follow daily INR levels.   Sim Boast, PharmD, BCPS  03/14/16   Addendum: Pt started on ciprofloxacin 500 mg PO q 12 hours and metronidazole 500 me PO q 8 hours for cdiff positive and ecoli GI panel positive. Per MD, pt have  symptoms of cdiff. Dose reduced warfarin to 5 mg x 1 at 1800. Will f/u with am labs.  Darrow Bussing, PharmD Pharmacy Resident 03/14/2016 1:06 PM

## 2016-03-14 NOTE — Progress Notes (Signed)
Having loose stools and now c-diff positive,enteric precautions initiated and treatment started,heparin drip continues at 16 ml/hr.

## 2016-03-15 LAB — BASIC METABOLIC PANEL
ANION GAP: 6 (ref 5–15)
BUN: 16 mg/dL (ref 6–20)
CALCIUM: 8.4 mg/dL — AB (ref 8.9–10.3)
CO2: 22 mmol/L (ref 22–32)
CREATININE: 0.78 mg/dL (ref 0.61–1.24)
Chloride: 107 mmol/L (ref 101–111)
Glucose, Bld: 106 mg/dL — ABNORMAL HIGH (ref 65–99)
Potassium: 4.1 mmol/L (ref 3.5–5.1)
SODIUM: 135 mmol/L (ref 135–145)

## 2016-03-15 LAB — PROTIME-INR
INR: 1.51
Prothrombin Time: 18.4 seconds — ABNORMAL HIGH (ref 11.4–15.2)

## 2016-03-15 LAB — CBC
HCT: 42.9 % (ref 40.0–52.0)
Hemoglobin: 14.7 g/dL (ref 13.0–18.0)
MCH: 32.4 pg (ref 26.0–34.0)
MCHC: 34.3 g/dL (ref 32.0–36.0)
MCV: 94.5 fL (ref 80.0–100.0)
Platelets: 177 10*3/uL (ref 150–440)
RBC: 4.54 MIL/uL (ref 4.40–5.90)
RDW: 13.6 % (ref 11.5–14.5)
WBC: 8.6 10*3/uL (ref 3.8–10.6)

## 2016-03-15 LAB — HEPARIN LEVEL (UNFRACTIONATED): HEPARIN UNFRACTIONATED: 0.46 [IU]/mL (ref 0.30–0.70)

## 2016-03-15 MED ORDER — APIXABAN 5 MG PO TABS
5.0000 mg | ORAL_TABLET | Freq: Two times a day (BID) | ORAL | Status: DC
  Administered 2016-03-15 – 2016-03-16 (×3): 5 mg via ORAL
  Filled 2016-03-15 (×4): qty 1

## 2016-03-15 MED ORDER — WARFARIN SODIUM 5 MG PO TABS: 7.5000 mg | ORAL_TABLET | Freq: Every day | ORAL | Status: DC

## 2016-03-15 NOTE — Progress Notes (Signed)
ANTICOAGULATION CONSULT NOTE - Follow Up Consult  Pharmacy Consult for Heparin and Warfarin bridging Indication: left ventricular mural thrombus  No Known Allergies  Patient Measurements: Height: 5\' 11"  (180.3 cm) Weight: 216 lb 4.3 oz (98.1 kg) IBW/kg (Calculated) : 75.3 Heparin Dosing Weight: 95.3 kg  Vital Signs: Temp: 98.6 F (37 C) (02/12 0337) Temp Source: Oral (02/12 0337) BP: 105/61 (02/12 0337) Pulse Rate: 55 (02/12 0337)  Labs:  Recent Labs  03/13/16 0459 03/14/16 0522 03/15/16 0708  HGB 14.3 15.2 14.7  HCT 42.8 43.5 42.9  PLT 168 187 177  LABPROT 14.4 15.6* 18.4*  INR 1.11 1.23 1.51  HEPARINUNFRC 0.42 0.42 0.46  CREATININE 0.88  --  0.78    Estimated Creatinine Clearance: 111.4 mL/min (by C-G formula based on SCr of 0.78 mg/dL).  Assessment: 66 yo male admitted with STEMI, now found to have left ventricular mural thrombus. Pharmacy consulted for heparin and warfarin dosing and monitoring.  Heparin course: 2/7 Heparin initiated at 1400 units/hr 2/9 AM Heparin increased to 1600 units/hr for HL of 0.20 2/9 12N HL 0.45 2/9 1800 HL=0.42 2/10 AM heparin level 0.42. Continue current regimen. 2/11 AM heparin level 0.42. Continue current regimen.  Coumadin: 2/8 INR 1.01  Warfarin 7.5mg  2/9 INR 1.09  Warfarin 7.5mg  2/10 INR 1.11  Warfarin 7.5 mg 2/11 INR 1.23  Warfarin 5mg  2/12 INR 1.51  Goal of Therapy:  INR 2-3 Heparin level 0.3-0.7 units/ml Monitor platelets by anticoagulation protocol: Yes   Plan:  Will continue with Heparin at current rate of 1600 units/hr and check level in the AM. Will give Warfarin 7.5mg  tonight 2/12 and follow daily INR levels. Patient now on cipro and flagyl will need to watch INR closely.  Paulina Fusi, PharmD, BCPS 03/15/2016 11:07 AM

## 2016-03-15 NOTE — Progress Notes (Signed)
[place] at [place] NAME: Manolis Sosinski    MR#:  [id number]  DATE OF BIRTH:  [birth year]  SUBJECTIVE:  CHIEF COMPLAINT:  No chief complaint on file.  - Diarrhea resolved today. Started on antibiotics for C. difficile and entero toxicogenic E coli in the stool -INR still at 1.5 today  REVIEW OF SYSTEMS:  Review of Systems  Constitutional: Negative for chills, diaphoresis, fever and malaise/fatigue.  HENT: Negative for ear discharge, ear pain, hearing loss and nosebleeds.   Eyes: Negative for blurred vision and double vision.  Respiratory: Negative for cough, shortness of breath and wheezing.   Cardiovascular: Negative for chest pain, palpitations and leg swelling.  Gastrointestinal: Positive for diarrhea. Negative for abdominal pain, constipation, nausea and vomiting.  Genitourinary: Negative for dysuria.  Musculoskeletal: Negative for myalgias.  Neurological: Negative for dizziness, speech change, focal weakness, seizures and headaches.    DRUG ALLERGIES:  No Known Allergies  VITALS:  Blood pressure 105/70, pulse 62, temperature 98.2 F (36.8 C), temperature source Oral, resp. rate 16, height 5\' 11"  (1.803 m), weight 98.1 kg (216 lb 4.3 oz), SpO2 96 %.  PHYSICAL EXAMINATION:  Physical Exam  GENERAL:  66 y.o.-year-old patient lying in the bed with no acute distress.  EYES: Pupils equal, round, reactive to light and accommodation. No scleral icterus. Extraocular muscles intact.  HEENT: Head atraumatic, normocephalic. Oropharynx and nasopharynx clear.  NECK:  Supple, no jugular venous distention. No thyroid enlargement, no tenderness.  LUNGS: Normal breath sounds bilaterally, no wheezing, rales,rhonchi or crepitation. No use of accessory muscles of respiration.  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 S1, S2 normal. No murmurs, rubs, or gallops.  ABDOMEN: Soft, nontender, nondistended. Bowel  sounds present. No organomegaly or mass.  EXTREMITIES: No pedal edema, cyanosis, or clubbing.  NEUROLOGIC: Cranial nerves II through XII are intact. Muscle strength 5/5 in all extremities. Sensation intact. Gait not checked.  PSYCHIATRIC: The patient is alert and oriented x 3.  SKIN: No obvious rash, lesion, or ulcer.    LABORATORY PANEL:   CBC  Recent Labs Lab 03/15/16 0708  WBC 8.6  HGB 14.7  HCT 42.9  PLT 177   ------------------------------------------------------------------------------------------------------------------  Chemistries   Recent Labs Lab 03/09/16 1331  03/10/16 1112  03/15/16 0708  NA 142  < >  --   < > 135  K 4.7  < >  --   < > 4.1  CL 105  < >  --   < > 107  CO2 31  < >  --   < > 22  GLUCOSE 123*  < >  --   < > 106*  BUN 16  < >  --   < > 16  CREATININE 0.92  < >  --   < > 0.78  CALCIUM 9.2  < >  --   < > 8.4*  MG  --   --  1.9  --   --   AST 127*  --   --   --   --   ALT 52  --   --   --   --   ALKPHOS 45  --   --   --   --   BILITOT 0.7  --   --   --   --   < > = values in this interval not displayed. ------------------------------------------------------------------------------------------------------------------  Cardiac Enzymes  Recent Labs Lab 03/10/16 1112  TROPONINI 29.06*   ------------------------------------------------------------------------------------------------------------------  RADIOLOGY:  No results found.  EKG:   Orders placed or performed during the hospital encounter of 03/09/16  . EKG 12-Lead  . EKG 12-Lead  . ED EKG  . ED EKG  . EKG 12-Lead immediately post procedure  . EKG 12-Lead  . EKG 12-Lead immediately post procedure  . EKG 12-Lead  . EKG 12-Lead  . EKG 12-Lead    ASSESSMENT AND PLAN:   Brayam Garsee  is a 66 y.o. male with a known history of Anxiety, BPH, hypertension presents from home secondary to onset of chest pain and difficulty breathing- noted to have STEMI.  #1 STEMI- appreciate  cardiology consult, s/p cardiac catheterization and LAD drug eluting stent placed - on asa, plavix, on coreg, loasrtan and statin - ECHO with EF at 30% - prn nitro  #2 LV thrombus- noted on ECHO -was on heparin drip with coumadin, however, due to recent addition of ABX and slow change in INR- cardiology thinking to start eliquis and repeat ECHO as outpatient  #3 Ischemic cardiomyopathy- secondary to STEMI, EF noted to be 30% On life vest. Repeat ECHO in 6-8 weeks after discharge - on coreg and losartan added  #4 Infectious diarrhea- diarrhea, stool with c.diff and E.coli Started on flagyl and cipro  #5 DVT prophylaxis- Lovenox  Ambulating well. Discharge tomorrow   All the records are reviewed and case discussed with Care Management/Social Workerr. Management plans discussed with the patient, family and they are in agreement.  CODE STATUS: Full Code  TOTAL TIME TAKING CARE OF THIS PATIENT: 36 minutes.   POSSIBLE D/C TOMORROW, DEPENDING ON CLINICAL CONDITION.   Gladstone Lighter M.D on 03/15/2016 at 1:52 PM  Between 7am to 6pm - Pager - [phone]  After 6pm go to [email or web address] - Proofreader  [facility]  Office  [phone]  CC: Primary care physician; Vernie Murders, PA

## 2016-03-15 NOTE — Plan of Care (Signed)
Problem: Cardiac: Goal: Vascular access site(s) Level 0-1 will be maintained Outcome: Completed/Met Date Met: 03/15/16 Right radial access without complications.

## 2016-03-15 NOTE — Progress Notes (Signed)
Patient Name: Edward [name] Date of Encounter: 03/15/2016  Primary Cardiologist: New to [facility] - consult by End  Hospital Problem List     Principal Problem:   STEMI (ST elevation myocardial infarction) [facility]) Active Problems:   Ischemic cardiomyopathy   Smoker   Mixed hyperlipidemia   Encounter for anticoagulation discussion and counseling   Clostridium difficile diarrhea     Subjective   No chest pain. Wearing LifeVest without any noted alarms or discharges. INR remains subtherapeutic at 1.51. Developed diarrhea over the weekend. Studies positive for C diff. On Cipro with a decreased dose of Coumadin per pharmacy given quinolone. CBC stable. Renal function and potassium stable. Still does not want to pursue Lovenox injections for bridge given poor experience with giving his mother Lovenox injections. Tolerating ASA, Plavix, and Coumadin without issues.    Inpatient Medications    Scheduled Meds: . aspirin  81 mg Oral Daily  . atorvastatin  80 mg Oral q1800  . carvedilol  3.125 mg Oral BID WC  . ciprofloxacin  500 mg Oral BID  . clopidogrel  75 mg Oral Daily  . famotidine  20 mg Oral BID  . losartan  12.5 mg Oral Daily  . metroNIDAZOLE  500 mg Oral Q8H  . sodium chloride flush  3 mL Intravenous Q12H  . warfarin  7.5 mg Oral q1800  . Warfarin - Pharmacist Dosing Inpatient   Does not apply q1800   Continuous Infusions: . sodium chloride 1,000 mL/hr (03/09/16 1337)  . heparin 1,600 Units/hr (03/14/16 1625)   PRN Meds: sodium chloride, acetaminophen **OR** acetaminophen, ondansetron (ZOFRAN) IV, ondansetron **OR** ondansetron (ZOFRAN) IV, phenol-menthol, sodium chloride flush   Vital Signs    Vitals:   03/14/16 0807 03/14/16 1057 03/14/16 1945 03/15/16 0337  BP: 103/60 115/62 (!) 97/59 105/61  Pulse: (!) 57 65 65 (!) 55  Resp:  20 16 16   Temp: 97.9 F (36.6 C) 97.9 F (36.6 C) 98.8 F (37.1 C) 98.6 F (37 C)  TempSrc: Oral Oral Oral Oral  SpO2: 95% 95% 96% 99%    Weight:      Height:        Intake/Output Summary (Last 24 hours) at 03/15/16 1115 Last data filed at 03/15/16 0943  Gross per 24 hour  Intake           825.34 ml  Output              500 ml  Net           325.34 ml   Filed Weights   03/10/16 0400  Weight: 216 lb 4.3 oz (98.1 kg)    Physical Exam    GEN: Well nourished, well developed, in no acute distress. Wearing LifeVest.  HEENT: Grossly normal.  Neck: Supple, no JVD, carotid bruits, or masses. Cardiac: RRR, no murmurs, rubs, or gallops. No clubbing, cyanosis, edema.  Radials/DP/PT 2+ and equal bilaterally.  Respiratory:  Respirations regular and unlabored, clear to auscultation bilaterally. GI: Soft, nontender, nondistended, BS + x 4. MS: no deformity or atrophy. Skin: warm and dry, no rash. Neuro:  Strength and sensation are intact. Psych: AAOx3.  Normal affect.  Labs    CBC  Recent Labs  03/14/16 0522 03/15/16 0708  WBC 8.4 8.6  HGB 15.2 14.7  HCT 43.5 42.9  MCV 94.0 94.5  PLT 187 [id number]   Basic Metabolic Panel  Recent Labs  03/13/16 0459 03/15/16 0708  NA 138 135  K 4.2 4.1  CL 108 107  CO2 23 22  GLUCOSE 110* 106*  BUN 16 16  CREATININE 0.88 0.78  CALCIUM 8.6* 8.4*   Liver Function Tests No results for input(s): AST, ALT, ALKPHOS, BILITOT, PROT, ALBUMIN in the last 72 hours. No results for input(s): LIPASE, AMYLASE in the last 72 hours. Cardiac Enzymes No results for input(s): CKTOTAL, CKMB, CKMBINDEX, TROPONINI in the last 72 hours. BNP Invalid input(s): POCBNP D-Dimer No results for input(s): DDIMER in the last 72 hours. Hemoglobin A1C No results for input(s): HGBA1C in the last 72 hours. Fasting Lipid Panel No results for input(s): CHOL, HDL, LDLCALC, TRIG, CHOLHDL, LDLDIRECT in the last 72 hours. Thyroid Function Tests No results for input(s): TSH, T4TOTAL, T3FREE, THYROIDAB in the last 72 hours.  Invalid input(s): FREET3  Telemetry    Sinus bradycardia, 50's bpm - Personally  Reviewed  ECG    n/a - Personally Reviewed  Radiology    No results found.  Cardiac Studies   LHC 03/09/16: Conclusion   Conclusions: 1.  Late presenting STEMI (symptom onset ~18 hours ago) with thrombotic occlusion of the proximal/mid LAD involving D1 and D2. 2.  Mild to moderate, non-obstructive CAD involving LCx and RCA. 3.  Successful IVUS-guided PCI to LAD proximal and mid LAD using an Integrity Resolute 3.0 x 38 mm drug-eluting stent (post-dilated up to 4.0 mm) with 0% residual stenosis and TIMI-flow.  D2 was jailed by the LAD stent resulting in 70% ostial stenosis with TIMI-3 flow.  Recommendations: 1. Admit to ICU, hospitalist service. 2. Continue ticagrelor infusion x 6 hours. 3. Dual antiplatelet therapy with ASA and ticagrelor for at least 12 months. 4. Cycle troponins until they have peaked, then stop. 5. Obtain transthoracic echocardiogram. 6. Change metoprolol to carvedilol 3.125 mg BID, to be uptitrated as HR and BP allow. 7. Plan to add ACEI before discharge. 8.  Close monitoring and counseling regarding extended fluoroscopy time and radiation exposure    Echo 03/10/16: Study Conclusions  - Procedure narrative: Transthoracic echocardiography. The study   was technically difficult. Intravenous contrast (Definity) was   administered. - Left ventricle: The cavity size was normal. Wall thickness could   not be accurately determined. Systolic function was moderately to   severely reduced. The estimated ejection fraction was in the   range of 30% to 35%. Doppler parameters are consistent with   abnormal left ventricular relaxation (grade 1 diastolic   dysfunction). Mural thrombus is suspected at the left ventricular   apex. - Left atrium: The atrium was mildly dilated. - Right ventricle: The cavity size was normal. Systolic function   was normal. - Right atrium: The atrium was mildly to moderately dilated. - Pericardium, extracardiac: A trivial  pericardial effusion was   identified.  Impressions:  - 1. Moderate to severe LV dysfuction with LAD territory infarct.   LVEF 30-35%.   2. Likely mural thrombus at the left ventricular apex.   3. Limited evaluation of the cardiac valve due to lack of   parasternal windows.  Patient Profile     66 y.o. male with history of HTN who presented to [facility] on 2/6 after presenting to his PCP eariler in the day with chest pain that began on 2/5 that was similar to chest pain he had 3-4 weeks prior was found to have a late-presenting anterior ST elevation MI s/p IVUS-guided PCI/DES to the proximal and mid LAD as detailed above as well as LV mural thrombus on echo. Hospital stay has been complicated by  prolonged heparin to therapeutic Coumadin bridge as well as development of C diff colitis now on Cipro and Flagyl.   Assessment & Plan    1. Anterior ST elevation MI: -Late presenting, possibly felt to have had subtotal occlusion in the past with his acute pain beginning last night due to acute plaque rupture and thrombus formation (type I MI). Pain has resolved with PCI. Moderate size D2 was jailed by the stent with ~70% ostial stenosis but TIMI-3 flow. D1 is a moderate-caliber branch that appears chronically occluded. There is also mild to moderate disease involving the LCx and RCA, which will need to be treated medically. -Continue DAPT with ASA 81 mg and Plavix (changed from Brilinta to Plavix upon initiation of triple therapy with Coumadin given LV mural thrombus), likely discontinue ASA vs Plavix in the future with continuation of Coumadin -Echo with reduced EF as above at 30-35%-->wearing LifeVest -Lipitor, Coreg, losartan -If BP allows consider starting spironolactone prior to discharge -Currently chest pain free -Cardiac rehab  2. ICM: -He does not appear volume overloaded at this time -Coreg as above, titrate as tolerated -Echo showed an EF of 30-35%, wearing LifeVest without any noted  alarms or discharges  -Will need to optimize HF medications as an outpatient -Will need repeat echo as an outpatient in 90 days to evaluate for improved LVSF. If LVSF remains < 35% will need to see EP for evaluation of ICD -May need prn Lasix  3. LV mural thrombus: -On Coumadin/heparin bridge slow to reach therapeutic goal, now on Cipro and Flagyl for C diff -Again discussed with him the idea of taking Lovenox injections as an outpatient to bridge to therapeutic INR to allow for discharge, he does not want to pursue Lovenox injections given a reported poor outcome with himself giving his mother Lovenox injections in the past -Discussed with Dr. Rockey Situ who states we can just place the patient on Eliquis and stop Coumadin and heparin gtt  4. C diff colitis: -On Cipro and Flagyl -Also with E coli in stool -May need to assess for sepsis   5. HLD: LDL not at goal -Lipitor as above -Will need follow up lipid and liver in 8 weeks as an outpatient   Signed, Marcille Blanco [place] Pager: [phone] 03/15/2016, 11:15 AM

## 2016-03-16 LAB — CBC
HCT: 45.8 % (ref 40.0–52.0)
Hemoglobin: 15.8 g/dL (ref 13.0–18.0)
MCH: 32.6 pg (ref 26.0–34.0)
MCHC: 34.5 g/dL (ref 32.0–36.0)
MCV: 94.5 fL (ref 80.0–100.0)
PLATELETS: 195 10*3/uL (ref 150–440)
RBC: 4.85 MIL/uL (ref 4.40–5.90)
RDW: 13.6 % (ref 11.5–14.5)
WBC: 7.4 10*3/uL (ref 3.8–10.6)

## 2016-03-16 LAB — PROTIME-INR
INR: 1.67
PROTHROMBIN TIME: 19.9 s — AB (ref 11.4–15.2)

## 2016-03-16 LAB — BASIC METABOLIC PANEL
Anion gap: 7 (ref 5–15)
BUN: 14 mg/dL (ref 6–20)
CO2: 23 mmol/L (ref 22–32)
Calcium: 9 mg/dL (ref 8.9–10.3)
Chloride: 106 mmol/L (ref 101–111)
Creatinine, Ser: 0.81 mg/dL (ref 0.61–1.24)
Glucose, Bld: 110 mg/dL — ABNORMAL HIGH (ref 65–99)
POTASSIUM: 4.2 mmol/L (ref 3.5–5.1)
SODIUM: 136 mmol/L (ref 135–145)

## 2016-03-16 LAB — APTT: APTT: 45 s — AB (ref 24–36)

## 2016-03-16 MED ORDER — WARFARIN - PHARMACIST DOSING INPATIENT
Freq: Every day | Status: DC
  Administered 2016-03-16: 18:00:00

## 2016-03-16 MED ORDER — SPIRONOLACTONE 25 MG PO TABS
12.5000 mg | ORAL_TABLET | Freq: Every day | ORAL | Status: DC
  Administered 2016-03-16 – 2016-03-19 (×4): 12.5 mg via ORAL
  Filled 2016-03-16 (×4): qty 1

## 2016-03-16 MED ORDER — HEPARIN (PORCINE) IN NACL 100-0.45 UNIT/ML-% IJ SOLN
1600.0000 [IU]/h | INTRAMUSCULAR | Status: DC
  Administered 2016-03-16: 1600 [IU]/h via INTRAVENOUS
  Filled 2016-03-16: qty 250

## 2016-03-16 MED ORDER — WARFARIN SODIUM 5 MG PO TABS
10.0000 mg | ORAL_TABLET | Freq: Once | ORAL | Status: AC
  Administered 2016-03-16: 10 mg via ORAL
  Filled 2016-03-16: qty 2

## 2016-03-16 NOTE — Care Management (Signed)
Patient has now decided against use of Eliquis and prefers to be treated with Coumadin.

## 2016-03-16 NOTE — Plan of Care (Signed)
Problem: Cardiovascular: Goal: Ability to achieve and maintain adequate cardiovascular perfusion will improve Outcome: Progressing No voiced complaints of pain.  Reporting "funny feeling" earlier in shift. SB/SR noted on telemetry this shift, blood pressure within normal limits.

## 2016-03-16 NOTE — Care Management (Signed)
Provide patient with coupons for Eliquis. Confirmed patient has pharmacy coverage.

## 2016-03-16 NOTE — Progress Notes (Signed)
[place] [facility] [place] NAME: Edward [name]    MR#:  [id number]  DATE OF BIRTH:  25-Oct-1950  SUBJECTIVE:  CHIEF COMPLAINT:  No chief complaint on file.  - Felt a little funny and dizzy with eliquis and wants to be back on coumadin again  REVIEW OF SYSTEMS:  Review of Systems  Constitutional: Negative for chills, diaphoresis, fever and malaise/fatigue.  HENT: Negative for ear discharge, ear pain, hearing loss and nosebleeds.   Eyes: Negative for blurred vision and double vision.  Respiratory: Negative for cough, shortness of breath and wheezing.   Cardiovascular: Negative for chest pain, palpitations and leg swelling.  Gastrointestinal: Negative for abdominal pain, constipation, diarrhea, nausea and vomiting.  Genitourinary: Negative for dysuria.  Musculoskeletal: Negative for myalgias.  Neurological: Positive for dizziness. Negative for speech change, focal weakness, seizures and headaches.    DRUG ALLERGIES:  No Known Allergies  VITALS:  Blood pressure (!) 110/55, pulse 60, temperature 97.8 F (36.6 C), temperature source Oral, resp. rate 20, height 5\' 11"  (1.803 m), weight 98.1 kg (216 lb 4.3 oz), SpO2 97 %.  PHYSICAL EXAMINATION:  Physical Exam  GENERAL:  66 y.o.-year-old patient lying in the bed with no acute distress.  EYES: Pupils equal, round, reactive to light and accommodation. No scleral icterus. Extraocular muscles intact.  HEENT: Head atraumatic, normocephalic. Oropharynx and nasopharynx clear.  NECK:  Supple, no jugular venous distention. No thyroid enlargement, no tenderness.  LUNGS: Normal breath sounds bilaterally, no wheezing, rales,rhonchi or crepitation. No use of accessory muscles of respiration.  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 S1, S2 normal. No murmurs, rubs, or gallops.  ABDOMEN: Soft, nontender, nondistended. Bowel sounds present. No organomegaly or mass.    EXTREMITIES: No pedal edema, cyanosis, or clubbing.  NEUROLOGIC: Cranial nerves II through XII are intact. Muscle strength 5/5 in all extremities. Sensation intact. Gait not checked.  PSYCHIATRIC: The patient is alert and oriented x 3.  SKIN: No obvious rash, lesion, or ulcer.    LABORATORY PANEL:   CBC  Recent Labs Lab 03/16/16 0446  WBC 7.4  HGB 15.8  HCT 45.8  PLT 195   ------------------------------------------------------------------------------------------------------------------  Chemistries   Recent Labs Lab 03/09/16 1331  03/10/16 1112  03/16/16 0446  NA 142  < >  --   < > 136  K 4.7  < >  --   < > 4.2  CL 105  < >  --   < > 106  CO2 31  < >  --   < > 23  GLUCOSE 123*  < >  --   < > 110*  BUN 16  < >  --   < > 14  CREATININE 0.92  < >  --   < > 0.81  CALCIUM 9.2  < >  --   < > 9.0  MG  --   --  1.9  --   --   AST 127*  --   --   --   --   ALT 52  --   --   --   --   ALKPHOS 45  --   --   --   --   BILITOT 0.7  --   --   --   --   < > = values in this interval not displayed. ------------------------------------------------------------------------------------------------------------------  Cardiac Enzymes  Recent Labs Lab 03/10/16 1112  TROPONINI 29.06*   ------------------------------------------------------------------------------------------------------------------  RADIOLOGY:  No results found.  EKG:   Orders placed or performed during the hospital encounter of 03/09/16  . EKG 12-Lead  . EKG 12-Lead  . ED EKG  . ED EKG  . EKG 12-Lead immediately post procedure  . EKG 12-Lead  . EKG 12-Lead immediately post procedure  . EKG 12-Lead  . EKG 12-Lead  . EKG 12-Lead    ASSESSMENT AND PLAN:   Edward [name]  is a 66 y.o. male with a known history of Anxiety, BPH, hypertension presents from home secondary to onset of chest pain and difficulty breathing- noted to have STEMI.  #1 STEMI- appreciate cardiology consult, s/p cardiac catheterization  and LAD drug eluting stent placed - on asa, plavix, on coreg, loasrtan and statin - ECHO with EF at 30% - prn nitro  #2 LV thrombus- noted on ECHO -restarted on heparin drip with coumadin, monitor while on ABX - INR at 1.6 today - patient does not want to be on eliquis\  #3 Ischemic cardiomyopathy- secondary to STEMI, EF noted to be 30% On life vest. Repeat ECHO after discharge - on coreg and losartan added  #4 Infectious diarrhea- diarrhea, stool with c.diff and E.coli - on flagyl and cipro  #5 DVT prophylaxis- coumadin and heparin drip  Ambulating well. Discharge once INR is therapeutic   All the records are reviewed and case discussed with Care Management/Social Workerr. Management plans discussed with the patient, family and they are in agreement.  CODE STATUS: Full Code  TOTAL TIME TAKING CARE OF THIS PATIENT: 36 minutes.   POSSIBLE D/C 1-2 days, DEPENDING ON CLINICAL CONDITION.   Gladstone Lighter M.D on 03/16/2016 at 12:51 PM  Between 7am to 6pm - Pager - [phone]  After 6pm go to [email or web address] - Proofreader  [facility]  Office  [phone]  CC: Primary care physician; Vernie Murders, PA

## 2016-03-16 NOTE — Progress Notes (Signed)
Patient Name: Edward [name] Date of Encounter: 03/16/2016  Primary Cardiologist: New to [facility] - consult by End  Hospital Problem List     Principal Problem:   STEMI (ST elevation myocardial infarction) ([place]) Active Problems:   Ischemic cardiomyopathy   Smoker   Mixed hyperlipidemia   Encounter for anticoagulation discussion and counseling   Clostridium difficile diarrhea     Subjective   No acute overnight events. Changed to Eliquis from Coumadin on 2/12 by rounding cardiologist for mural thrombus. Tolerating triple therapy. Wants to go home. No further chest pain.   Inpatient Medications    Scheduled Meds: . apixaban  5 mg Oral BID  . aspirin  81 mg Oral Daily  . atorvastatin  80 mg Oral q1800  . carvedilol  3.125 mg Oral BID WC  . ciprofloxacin  500 mg Oral BID  . clopidogrel  75 mg Oral Daily  . famotidine  20 mg Oral BID  . losartan  12.5 mg Oral Daily  . metroNIDAZOLE  500 mg Oral Q8H  . sodium chloride flush  3 mL Intravenous Q12H   Continuous Infusions: . sodium chloride 1,000 mL/hr (03/09/16 1337)   PRN Meds: sodium chloride, acetaminophen **OR** acetaminophen, ondansetron (ZOFRAN) IV, ondansetron **OR** ondansetron (ZOFRAN) IV, phenol-menthol, sodium chloride flush   Vital Signs    Vitals:   03/15/16 0337 03/15/16 1239 03/15/16 2013 03/16/16 0459  BP: 105/61 105/70 104/60 123/70  Pulse: (!) 55 62 62 (!) 54  Resp: 16 16 14 16   Temp: 98.6 F (37 C) 98.2 F (36.8 C) 97.8 F (36.6 C) 98 F (36.7 C)  TempSrc: Oral Oral Oral   SpO2: 99% 96% 96% 98%  Weight:      Height:        Intake/Output Summary (Last 24 hours) at 03/16/16 0854 Last data filed at 03/16/16 0700  Gross per 24 hour  Intake                0 ml  Output              550 ml  Net             -550 ml   Filed Weights   03/10/16 0400  Weight: 216 lb 4.3 oz (98.1 kg)    Physical Exam    GEN: Well nourished, well developed, in no acute distress.  HEENT: Grossly normal.  Neck:  Supple, no JVD, carotid bruits, or masses. Cardiac: RRR, no murmurs, rubs, or gallops. No clubbing, cyanosis, edema.  Radials/DP/PT 2+ and equal bilaterally.  Respiratory:  Respirations regular and unlabored, clear to auscultation bilaterally. GI: Soft, nontender, nondistended, BS + x 4. MS: no deformity or atrophy. Skin: warm and dry, no rash. Neuro:  Strength and sensation are intact. Psych: AAOx3.  Normal affect.  Labs    CBC  Recent Labs  03/15/16 0708 03/16/16 0446  WBC 8.6 7.4  HGB 14.7 15.8  HCT 42.9 45.8  MCV 94.5 94.5  PLT 177 0000000   Basic Metabolic Panel  Recent Labs  03/15/16 0708 03/16/16 0446  NA 135 136  K 4.1 4.2  CL 107 106  CO2 22 23  GLUCOSE 106* 110*  BUN 16 14  CREATININE 0.78 0.81  CALCIUM 8.4* 9.0   Liver Function Tests No results for input(s): AST, ALT, ALKPHOS, BILITOT, PROT, ALBUMIN in the last 72 hours. No results for input(s): LIPASE, AMYLASE in the last 72 hours. Cardiac Enzymes No results for input(s): CKTOTAL,  CKMB, CKMBINDEX, TROPONINI in the last 72 hours. BNP Invalid input(s): POCBNP D-Dimer No results for input(s): DDIMER in the last 72 hours. Hemoglobin A1C No results for input(s): HGBA1C in the last 72 hours. Fasting Lipid Panel No results for input(s): CHOL, HDL, LDLCALC, TRIG, CHOLHDL, LDLDIRECT in the last 72 hours. Thyroid Function Tests No results for input(s): TSH, T4TOTAL, T3FREE, THYROIDAB in the last 72 hours.  Invalid input(s): FREET3  Telemetry    Sinus bradycardia, upper 50's bpm - Personally Reviewed  ECG    n/a - Personally Reviewed  Radiology    No results found.  Cardiac Studies   LHC 03/09/16: Conclusion   Conclusions: 1. Late presenting STEMI (symptom onset ~18 hours ago) with thrombotic occlusion of the proximal/mid LAD involving D1 and D2. 2. Mild to moderate, non-obstructive CAD involving LCx and RCA. 3. Successful IVUS-guided PCI to LAD proximal and mid LAD using an Integrity  Resolute 3.0 x 38 mm drug-eluting stent (post-dilated up to 4.0 mm) with 0% residual stenosis and TIMI-flow. D2 was jailed by the LAD stent resulting in 70% ostial stenosis with TIMI-3 flow.  Recommendations: 1. Admit to ICU, hospitalist service. 2. Continue ticagrelor infusion x 6 hours. 3. Dual antiplatelet therapy with ASA and ticagrelor for at least 12 months. 4. Cycle troponins until they have peaked, then stop. 5. Obtain transthoracic echocardiogram. 6. Change metoprolol to carvedilol 3.125 mg BID, to be uptitrated as HR and BP allow. 7. Plan to add ACEI before discharge. 8. Close monitoring and counseling regarding extended fluoroscopy time and radiation exposure    Echo 03/10/16: Study Conclusions  - Procedure narrative: Transthoracic echocardiography. The study was technically difficult. Intravenous contrast (Definity) was administered. - Left ventricle: The cavity size was normal. Wall thickness could not be accurately determined. Systolic function was moderately to severely reduced. The estimated ejection fraction was in the range of 30% to 35%. Doppler parameters are consistent with abnormal left ventricular relaxation (grade 1 diastolic dysfunction). Mural thrombus is suspected at the left ventricular apex. - Left atrium: The atrium was mildly dilated. - Right ventricle: The cavity size was normal. Systolic function was normal. - Right atrium: The atrium was mildly to moderately dilated. - Pericardium, extracardiac: A trivial pericardial effusion was identified.  Impressions:  - 1. Moderate to severe LV dysfuction with LAD territory infarct. LVEF 30-35%. 2. Likely mural thrombus at the left ventricular apex. 3. Limited evaluation of the cardiac valve due to lack of parasternal windows.   Patient Profile     66 y.o. male with history of HTN who presented to [facility] on 2/6 after presenting to his PCP eariler in the day with  chest pain that began on 2/5 that was similar to chest pain he had 3-4 weeks prior was found to have a late-presenting anterior ST elevation MI s/p IVUS-guided PCI/DES to the proximal and mid LAD as detailed above as well as LV mural thrombus on echo. Hospital stay has been complicated by prolonged heparin to therapeutic Coumadin bridge as well as development of C diff as well as E coli colitis now on Cipro and Flagyl.   Assessment & Plan    1. Anterior ST elevation MI: -Late presenting, possibly felt to have had subtotal occlusion in the past with his acute pain beginning last night due to acute plaque rupture and thrombus formation (type I MI). Pain has resolved with PCI. Moderate size D2 was jailed by the stent with ~70% ostial stenosis but TIMI-3 flow. D1 is a Neurosurgeon  that appears chronically occluded. There is also mild to moderate disease involving the LCx and RCA, which will need to be treated medically. -Continue DAPT with ASA 81 mg and Plavix (changed from Brilinta to Plavix upon initiation of triple therapy with Coumadin-->Eliquis on 2/12 given LV mural thrombus), likely discontinue ASA vs Plavix in the future with continuation of antocoagulation -Echo with reduced EF as above at 30-35%-->wearing LifeVest (no alarms or discharges) -Lipitor, Coreg, losartan -Add spironolactone 12.5 mg daily -Currently chest pain free -Cardiac rehab  2. ICM: -He does not appear volume overloaded at this time -Coreg as above, titrate as tolerated-->hold Coreg this AM 2/2 bradycardia -Echo showed an EF of 30-35%, wearing LifeVest without any noted alarms or discharges  -Will need to optimize HF medications as an outpatient -Will need repeat echo as an outpatient in 90 days to evaluate for improved LVSF. If LVSF remains < 35% will need to see EP for evaluation of ICD -May need prn Lasix  3. LV mural thrombus: -On Coumadin/heparin bridge slow to reach therapeutic goal, complicated by C  diff and E coli colitis on Cipro and Flagyl  -Again discussed with him the idea of taking Lovenox injections on 2/12 as an outpatient to bridge to therapeutic INR to allow for discharge, he does not want to pursue Lovenox injections given a reported poor outcome with himself giving his mother Lovenox injections in the past -Started on Eliquis in place of Coumadin on 2/12 at the recommendation of Dr. Rockey Situ -Defer long term anticoagulation decision to Dr. Rockey Situ  4. C diff and E coli colitis: -On Cipro and Flagyl -May need to assess for sepsis   5. HLD: LDL not at goal -Lipitor as above -Will need follow up lipid and liver in 8 weeks as an outpatient  6. Dispo: -Cardiology follow up in 7 days   Signed, Marcille Blanco [place] Pager: [phone] 03/16/2016, 8:54 AM

## 2016-03-16 NOTE — Progress Notes (Signed)
ANTICOAGULATION CONSULT NOTE - Follow Up Consult  Pharmacy Consult for Heparin and Warfarin bridging Indication: left ventricular mural thrombus  No Known Allergies  Patient Measurements: Height: 5\' 11"  (180.3 cm) Weight: 216 lb 4.3 oz (98.1 kg) IBW/kg (Calculated) : 75.3 Heparin Dosing Weight: 95.3 kg  Vital Signs: Temp: 97.9 F (36.6 C) (02/13 0857) BP: 117/66 (02/13 0857) Pulse Rate: 54 (02/13 0857)  Labs:  Recent Labs  03/14/16 0522 03/15/16 0708 03/16/16 0446  HGB 15.2 14.7 15.8  HCT 43.5 42.9 45.8  PLT 187 177 195  LABPROT 15.6* 18.4*  --   INR 1.23 1.51  --   HEPARINUNFRC 0.42 0.46  --   CREATININE  --  0.78 0.81    Estimated Creatinine Clearance: 110 mL/min (by C-G formula based on SCr of 0.81 mg/dL).  Assessment: 66 yo male admitted with STEMI, now found to have left ventricular mural thrombus. Pharmacy consulted for heparin and warfarin dosing and monitoring.  Patient had Heparin gtt and warfarin discontinued on 2/12 and started on Apixaban. Per MD [facility] patient now wants to be back on warfarin therapy and she would like to restart Heparin gtt and would like to give warfarin 10 mg PO today. Patient received last dose of Apixaban @ ~09:00 on 2/13  Heparin course: 2/7 Heparin initiated at 1400 units/hr 2/9 AM Heparin increased to 1600 units/hr for HL of 0.20 2/9 12N HL 0.45 2/9 1800 HL=0.42 2/10 AM heparin level 0.42. Continue current regimen. 2/11 AM heparin level 0.42. Continue current regimen. Had Heparin gtt discontinued on 2/12 see above for information left   Coumadin: 2/8 INR 1.01  Warfarin 7.5mg  2/9 INR 1.09  Warfarin 7.5mg  2/10 INR 1.11  Warfarin 7.5 mg 2/11 INR 1.23  Warfarin 5mg  2/12 INR 1.51l; started on apixaban see note  2/13:                ; warfarin 10 mg PO (MD Kalisetti's preference)   Goal of Therapy:  INR 2-3 Heparin level 0.3-0.7 units/ml Monitor platelets by anticoagulation protocol: Yes   Plan:  Will restart heparin  gtt @ 1600 units/hr 12 hours post apixaban dose. Will need to dose base on APTT since HL likely will be elevated due to previous Apixaban dosing. APTT and HL @ 03:00 on 2/14.   MD Tressia Miners stated she would like to give patient a 10 mg of warfarin tonight. Will need to watch closely since patient is on concurrent interacting medications.  PT/INR with am labs.     Larene Beach, PharmD   03/16/2016 11:28 AM

## 2016-03-17 DIAGNOSIS — I213 ST elevation (STEMI) myocardial infarction of unspecified site: Secondary | ICD-10-CM

## 2016-03-17 LAB — HEPARIN LEVEL (UNFRACTIONATED): HEPARIN UNFRACTIONATED: 1.5 [IU]/mL — AB (ref 0.30–0.70)

## 2016-03-17 LAB — CBC
HCT: 42.5 % (ref 40.0–52.0)
Hemoglobin: 14.6 g/dL (ref 13.0–18.0)
MCH: 32.5 pg (ref 26.0–34.0)
MCHC: 34.4 g/dL (ref 32.0–36.0)
MCV: 94.4 fL (ref 80.0–100.0)
PLATELETS: 199 10*3/uL (ref 150–440)
RBC: 4.5 MIL/uL (ref 4.40–5.90)
RDW: 13.4 % (ref 11.5–14.5)
WBC: 7.3 10*3/uL (ref 3.8–10.6)

## 2016-03-17 LAB — PROTIME-INR
INR: 1.65
PROTHROMBIN TIME: 19.7 s — AB (ref 11.4–15.2)

## 2016-03-17 LAB — APTT
aPTT: 155 seconds — ABNORMAL HIGH (ref 24–36)
aPTT: 108 seconds — ABNORMAL HIGH (ref 24–36)

## 2016-03-17 MED ORDER — WARFARIN SODIUM 5 MG PO TABS: 10.0000 mg | ORAL_TABLET | Freq: Once | ORAL | Status: DC

## 2016-03-17 MED ORDER — HEPARIN (PORCINE) IN NACL 100-0.45 UNIT/ML-% IJ SOLN: 1150.0000 [IU]/h | INTRAMUSCULAR | Status: DC

## 2016-03-17 MED ORDER — HEPARIN (PORCINE) IN NACL 100-0.45 UNIT/ML-% IJ SOLN
1500.0000 [IU]/h | INTRAMUSCULAR | Status: DC
  Administered 2016-03-17 (×2): 1500 [IU]/h via INTRAVENOUS
  Filled 2016-03-17: qty 250

## 2016-03-17 MED ORDER — WARFARIN SODIUM 5 MG PO TABS
7.5000 mg | ORAL_TABLET | Freq: Once | ORAL | Status: AC
  Administered 2016-03-17: 7.5 mg via ORAL
  Filled 2016-03-17: qty 2

## 2016-03-17 NOTE — Progress Notes (Addendum)
ANTICOAGULATION CONSULT NOTE - Follow Up Consult  Pharmacy Consult for Heparin and Warfarin bridging Indication: left ventricular mural thrombus  No Known Allergies  Patient Measurements: Height: 5\' 11"  (180.3 cm) Weight: 216 lb 4.3 oz (98.1 kg) IBW/kg (Calculated) : 75.3 Heparin Dosing Weight: 95.3 kg  Vital Signs: Temp: 98.6 F (37 C) (02/14 0828) Temp Source: Oral (02/14 0828) BP: 91/67 (02/14 0828) Pulse Rate: 57 (02/14 0828)  Labs:  Recent Labs  03/15/16 0708 03/16/16 0446 03/16/16 1119 03/17/16 0309  HGB 14.7 15.8  --  14.6  HCT 42.9 45.8  --  42.5  PLT 177 195  --  199  APTT  --   --  45* 155*  LABPROT 18.4*  --  19.9* 19.7*  INR 1.51  --  1.67 1.65  HEPARINUNFRC 0.46  --   --  1.50*  CREATININE 0.78 0.81  --   --     Estimated Creatinine Clearance: 110 mL/min (by C-G formula based on SCr of 0.81 mg/dL).  Assessment: 66 yo male admitted with STEMI, now found to have left ventricular mural thrombus. Pharmacy consulted for heparin and warfarin dosing and monitoring.  Patient had Heparin gtt and warfarin discontinued on 2/12 and started on Apixaban. Per MD [facility] patient now wants to be back on warfarin therapy and she would like to restart Heparin gtt and would like to give warfarin 10 mg PO today. Patient received last dose of Apixaban @ ~09:00 on 2/13  Heparin course: 2/7 Heparin initiated at 1400 units/hr 2/9 AM Heparin increased to 1600 units/hr for HL of 0.20 2/9 12N HL 0.45 2/9 1800 HL=0.42 2/10 AM heparin level 0.42. Continue current regimen. 2/11 AM heparin level 0.42. Continue current regimen. Had Heparin gtt discontinued on 2/12 see above for information left   Coumadin: 2/8 INR 1.01  Warfarin 7.5mg  2/9 INR 1.09  Warfarin 7.5mg  2/10 INR 1.11  Warfarin 7.5 mg 2/11 INR 1.23  Warfarin 5mg  2/12 INR 1.51l; started on apixaban see note  2/13: INR     1.67; warfarin 10 mg PO (MD Kalisetti's preference)  2/14: INR: 1.65; 7.5 mg   Goal of  Therapy:  INR 2-3 Heparin level 0.3-0.7 units/ml Monitor platelets by anticoagulation protocol: Yes   Plan:  2/13: Will restart heparin gtt @ 1600 units/hr 12 hours post apixaban dose. Will need to dose base on APTT since HL likely will be elevated due to previous Apixaban dosing. APTT and HL @ 03:00 on 2/14. .   2/14 0309 HL and aPTT both supratherapeutic. Asked RN to hold infusion x 1 hr, will restart at 1500 units/hr at 0600 and recheck aPTT in 6 hours.  2/14: Warfarin: Give 7.5 mg PO x 1. Will need to follow INR closely since patient is on significant drug drug interacting medicationos. APTT scheduled to be drawn @ 12:30. Goal APTT 68-102.    Larene Beach, PharmD  Clinical Pharmacist 03/17/2016 8:53 AM

## 2016-03-17 NOTE — Progress Notes (Signed)
Patient has no acute overnight. NSR with stable VS. Patient ambulated on the unit independently, and tolerated activity very well.  2100: Heparin infusion started. 0515: Heparin infusion stopped per pharmacy.  0630: Heparin infusion restarted

## 2016-03-17 NOTE — Progress Notes (Signed)
Patient Name: Edward [name] Date of Encounter: 03/17/2016  Primary Cardiologist: New to [facility] - consult by End  Hospital Problem List     Principal Problem:   STEMI (ST elevation myocardial infarction) ([place]) Active Problems:   Ischemic cardiomyopathy   Smoker   Mixed hyperlipidemia   Encounter for anticoagulation discussion and counseling   Clostridium difficile diarrhea     Subjective   No acute overnight events. Started back on heparin to Coumadin bridge on 2/13. Tolerating without issues. INR 1.65 this morning. No chest pain or SOB. Slight HA this morning.   Inpatient Medications    Scheduled Meds: . aspirin  81 mg Oral Daily  . atorvastatin  80 mg Oral q1800  . carvedilol  3.125 mg Oral BID WC  . ciprofloxacin  500 mg Oral BID  . clopidogrel  75 mg Oral Daily  . famotidine  20 mg Oral BID  . losartan  12.5 mg Oral Daily  . metroNIDAZOLE  500 mg Oral Q8H  . sodium chloride flush  3 mL Intravenous Q12H  . spironolactone  12.5 mg Oral Daily  . Warfarin - Pharmacist Dosing Inpatient   Does not apply q1800   Continuous Infusions: . sodium chloride 1,000 mL/hr (03/09/16 1337)  . heparin 1,500 Units/hr (03/17/16 0627)   PRN Meds: sodium chloride, acetaminophen **OR** acetaminophen, ondansetron (ZOFRAN) IV, ondansetron **OR** ondansetron (ZOFRAN) IV, phenol-menthol, sodium chloride flush   Vital Signs    Vitals:   03/16/16 0857 03/16/16 1146 03/16/16 2000 03/17/16 0444  BP: 117/66 (!) 110/55 103/68 106/65  Pulse: (!) 54 60 66 (!) 59  Resp:  20 18 18   Temp: 97.9 F (36.6 C) 97.8 F (36.6 C) 98.4 F (36.9 C) 98 F (36.7 C)  TempSrc:  Oral Oral Oral  SpO2: 98% 97% 96% 94%  Weight:      Height:        Intake/Output Summary (Last 24 hours) at 03/17/16 0724 Last data filed at 03/17/16 0700  Gross per 24 hour  Intake            20.78 ml  Output             1550 ml  Net         -1529.22 ml   Filed Weights   03/10/16 0400  Weight: 216 lb 4.3 oz (98.1 kg)     Physical Exam    GEN: Well nourished, well developed, in no acute distress.  HEENT: Grossly normal.  Neck: Supple, no JVD, carotid bruits, or masses. Cardiac: RRR, no murmurs, rubs, or gallops. No clubbing, cyanosis, edema.  Radials/DP/PT 2+ and equal bilaterally.  Respiratory:  Respirations regular and unlabored, clear to auscultation bilaterally. GI: Soft, nontender, nondistended, BS + x 4. MS: no deformity or atrophy. Skin: warm and dry, no rash. Neuro:  Strength and sensation are intact. Psych: AAOx3.  Normal affect.  Labs    CBC  Recent Labs  03/16/16 0446 03/17/16 0309  WBC 7.4 7.3  HGB 15.8 14.6  HCT 45.8 42.5  MCV 94.5 94.4  PLT 195 [id number]   Basic Metabolic Panel  Recent Labs  03/15/16 0708 03/16/16 0446  NA 135 136  K 4.1 4.2  CL 107 106  CO2 22 23  GLUCOSE 106* 110*  BUN 16 14  CREATININE 0.78 0.81  CALCIUM 8.4* 9.0   Liver Function Tests No results for input(s): AST, ALT, ALKPHOS, BILITOT, PROT, ALBUMIN in the last 72 hours. No results for input(s): LIPASE,  AMYLASE in the last 72 hours. Cardiac Enzymes No results for input(s): CKTOTAL, CKMB, CKMBINDEX, TROPONINI in the last 72 hours. BNP Invalid input(s): POCBNP D-Dimer No results for input(s): DDIMER in the last 72 hours. Hemoglobin A1C No results for input(s): HGBA1C in the last 72 hours. Fasting Lipid Panel No results for input(s): CHOL, HDL, LDLCALC, TRIG, CHOLHDL, LDLDIRECT in the last 72 hours. Thyroid Function Tests No results for input(s): TSH, T4TOTAL, T3FREE, THYROIDAB in the last 72 hours.  Invalid input(s): FREET3  Telemetry    Sinus bradycardia, 50's bpm - Personally Reviewed  ECG    n/a - Personally Reviewed  Radiology    No results found.  Cardiac Studies   LHC 03/09/16: Conclusion   Conclusions: 1. Late presenting STEMI (symptom onset ~18 hours ago) with thrombotic occlusion of the proximal/mid LAD involving D1 and D2. 2. Mild to moderate, non-obstructive  CAD involving LCx and RCA. 3. Successful IVUS-guided PCI to LAD proximal and mid LAD using an Integrity Resolute 3.0 x 38 mm drug-eluting stent (post-dilated up to 4.0 mm) with 0% residual stenosis and TIMI-flow. D2 was jailed by the LAD stent resulting in 70% ostial stenosis with TIMI-3 flow.  Recommendations: 1. Admit to ICU, hospitalist service. 2. Continue ticagrelor infusion x 6 hours. 3. Dual antiplatelet therapy with ASA and ticagrelor for at least 12 months. 4. Cycle troponins until they have peaked, then stop. 5. Obtain transthoracic echocardiogram. 6. Change metoprolol to carvedilol 3.125 mg BID, to be uptitrated as HR and BP allow. 7. Plan to add ACEI before discharge. 8. Close monitoring and counseling regarding extended fluoroscopy time and radiation exposure    Echo 03/10/16: Study Conclusions  - Procedure narrative: Transthoracic echocardiography. The study was technically difficult. Intravenous contrast (Definity) was administered. - Left ventricle: The cavity size was normal. Wall thickness could not be accurately determined. Systolic function was moderately to severely reduced. The estimated ejection fraction was in the range of 30% to 35%. Doppler parameters are consistent with abnormal left ventricular relaxation (grade 1 diastolic dysfunction). Mural thrombus is suspected at the left ventricular apex. - Left atrium: The atrium was mildly dilated. - Right ventricle: The cavity size was normal. Systolic function was normal. - Right atrium: The atrium was mildly to moderately dilated. - Pericardium, extracardiac: A trivial pericardial effusion was identified.  Impressions:  - 1. Moderate to severe LV dysfuction with LAD territory infarct. LVEF 30-35%. 2. Likely mural thrombus at the left ventricular apex. 3. Limited evaluation of the cardiac valve due to lack of parasternal windows.   Patient Profile     66 y.o.  male with history of HTN who presented to [facility] on 2/6 after presenting to his PCP eariler in the day with chest pain that began on 2/5 that was similar to chest pain he had 3-4 weeks prior was found to have a late-presenting anterior ST elevation MI s/p IVUS-guided PCI/DES to the proximal and mid LAD as detailed above as well as LV mural thrombus on echo. Hospital stay has been complicated by prolonged heparin to therapeutic Coumadin bridge as well as development of C diff as well as E coli colitis now on Cipro and Flagyl.   Assessment & Plan    1. Anterior ST elevation MI: -Late presenting, possibly felt to have had subtotal occlusion in the past with his acute pain beginning last night due to acute plaque rupture and thrombus formation (type I MI). Pain has resolved with PCI. Moderate size D2 was jailed by the stent  with ~70% ostial stenosis but TIMI-3 flow. D1 is a moderate-caliber branch that appears chronically occluded. There is also mild to moderate disease involving the LCx and RCA, which will need to be treated medically. -Continue DAPT with ASA 81 mg and Plavix (changed from Brilinta to Plavix upon initiation of triple therapy with Coumadin-->Eliquis on 2/12-->back to Coumadin 2/13), likely discontinue ASA vs Plavix in the future with continuation of antocoagulation -Echo with reduced EF as above at 30-35%-->wearing LifeVest (no alarms or discharges) -Lipitor, Coreg, losartan -Add spironolactone 12.5 mg daily -Currently chest pain free -Cardiac rehab  2. ICM: -He does not appear volume overloaded at this time -Coreg as above, titrate as tolerated-->hold Coreg this AM 2/2 bradycardia -Echo showed an EF of 30-35%, wearing LifeVest without any noted alarms or discharges  -Will need to optimize HF medications as an outpatient -Will need repeat echo as an outpatient in 90 days to evaluate for improved LVSF. If LVSF remains < 35% will need to see EP for evaluation of ICD -May need prn  Lasix  3. LV mural thrombus: -On Coumadin/heparin bridge slow to reach therapeutic goal, complicated by C diff and E coli colitis on Cipro and Flagyl  -Again discussed with him the idea of taking Lovenox injections on 2/12 as an outpatient to bridge to therapeutic INR to allow for discharge, he does not want to pursue Lovenox injections given a reported poor outcome with himself giving his mother Lovenox injections in the past -Started on Eliquis in place of Coumadin on 2/12, did not tolerate Eliquis, back on heparin gto Coumadin bridge on 2/13 without issues -Await therapeutic INR  4. C diff and E coli colitis: -On Cipro and Flagyl -May need to assess for sepsis   5. HLD: LDL not at goal -Lipitor as above -Will need follow up lipid and liver in 8 weeks as an outpatient   Signed, Marcille Blanco Shepherd Pager: [phone] 03/17/2016, 7:24 AM   Attending Note Patient seen and examined, agree with detailed note above,  Patient presentation and plan discussed on rounds.   Patient reports GI issues are improving Still in good spirits considering lengthy hospital stay  Discussed previous findings seen on echocardiogram No clear finding of significant thrombus but certainly significant spontaneous contrast in the apical region This finding does present a increased risk of thrombus and stroke Mixed data on whether to treat with antiplatelets alone or antiplatelets with vit K antagonists But certainly reasonable to treat aggressively He reports that he did not feel well on eliquis,  reported having tingling in his head, hands, prefers to stay on warfarin He does understand there is a higher risk of bleeding being on triple therapy, but at this point is willing to accept the risk to avoid stroke  Discussed with him that it may be challenging to determine when and if to stop his warfarin in the future if there is no significant improvement in LV function. [place] studies  treat for 3-6 months, While some [place] studies recommend  treating indefinitely We did discuss that there are active clinical trials evaluating eliquis and anecdotal trials suggesting non-inferiority of eliquis  He does appreciate the increased bleeding risk being on antibiotics and warfarin, and we have stressed the importance of maintaining INR 2 to 2.5 when used in triple combination with aspirin and Plavix  On clinical exam lungs are relatively clear, heart sounds regular, abdomen relatively benign, no leg edema  Ambulating well around the unit  Aldactone added as  above Now on beta blocker, losartan, aspirin, statin, Plavix, warfarin INR being monitored, and should be able to be discharged once INR reaches 2 with close outpatient follow-up of INR through our office  Greater than 50% was spent in counseling and coordination of care with patient Total encounter time 35 minutes or more   Signed: Esmond Plants  M.D., Ph.D. [facility]

## 2016-03-17 NOTE — Progress Notes (Addendum)
ANTICOAGULATION CONSULT NOTE - Follow Up Consult  Pharmacy Consult for Heparin and Warfarin bridging Indication: left ventricular mural thrombus  No Known Allergies  Patient Measurements: Height: 5\' 11"  (180.3 cm) Weight: 216 lb 4.3 oz (98.1 kg) IBW/kg (Calculated) : 75.3 Heparin Dosing Weight: 95.3 kg  Vital Signs: Temp: 98.3 F (36.8 C) (02/14 1231) Temp Source: Oral (02/14 1231) BP: 100/68 (02/14 1231) Pulse Rate: 54 (02/14 1231)  Labs:  Recent Labs  03/15/16 0708 03/16/16 0446 03/16/16 1119 03/17/16 0309 03/17/16 1248  HGB 14.7 15.8  --  14.6  --   HCT 42.9 45.8  --  42.5  --   PLT 177 195  --  199  --   APTT  --   --  45* 155* >160*  LABPROT 18.4*  --  19.9* 19.7*  --   INR 1.51  --  1.67 1.65  --   HEPARINUNFRC 0.46  --   --  1.50*  --   CREATININE 0.78 0.81  --   --   --    Estimated Creatinine Clearance: 110 mL/min (by C-G formula based on SCr of 0.81 mg/dL).  Assessment: 66 yo male admitted with STEMI, now found to have left ventricular mural thrombus. Pharmacy consulted for heparin and warfarin dosing and monitoring.  Patient had Heparin gtt and warfarin discontinued on 2/12 and started on Apixaban. Per MD [facility] patient now wants to be back on warfarin therapy and she would like to restart heparin drip. Patient received last dose of Apixaban @ ~09:00 on 2/13  Coumadin: 2/8 INR 1.01  Warfarin 7.5mg  2/9 INR 1.09  Warfarin 7.5mg  2/10 INR 1.11  Warfarin 7.5 mg 2/11 INR 1.23  Warfarin 5mg  2/12 INR 1.51l; started on apixaban see note  2/13: INR     1.67; warfarin 10 mg PO (MD Kalisetti's preference)  2/14: INR: 1.65; 7.5 mg   Goal of Therapy:  INR 2-3 Heparin level 0.3-0.7 units/ml Monitor platelets by anticoagulation protocol: Yes   Plan:  2/14 APTT >160 at 1248 (lab resulted at 1402) at an infusion rate of 1500 units/hr. Spoke with RN and stopped heparin drip ~1415. Will HOLD drip for 1 hour. RN denies any signs/symptoms of bleeding.   Will  resume heparin drip at a rate of 1150 units/hr at 1530 this afternoon. Will check APTT in 6 hours. Plan communicated to RN.  2/14 2121 aPTT slightly supratherapeutic, however large decrease this afternoon. Continue current rate. Will recheck HL and aPTT with AM labs. Jazlynne Milliner A. [place], [place].D., BCPS  Lenis Noon, PharmD, BCPS Clinical Pharmacist 03/17/2016 2:57 PM

## 2016-03-17 NOTE — Progress Notes (Signed)
ANTICOAGULATION CONSULT NOTE - Follow Up Consult  Pharmacy Consult for Heparin and Warfarin bridging Indication: left ventricular mural thrombus  No Known Allergies  Patient Measurements: Height: 5\' 11"  (180.3 cm) Weight: 216 lb 4.3 oz (98.1 kg) IBW/kg (Calculated) : 75.3 Heparin Dosing Weight: 95.3 kg  Vital Signs: Temp: 98 F (36.7 C) (02/14 0444) Temp Source: Oral (02/14 0444) BP: 106/65 (02/14 0444) Pulse Rate: 59 (02/14 0444)  Labs:  Recent Labs  03/14/16 0522 03/15/16 0708 03/16/16 0446 03/16/16 1119 03/17/16 0309  HGB 15.2 14.7 15.8  --  14.6  HCT 43.5 42.9 45.8  --  42.5  PLT 187 177 195  --  199  APTT  --   --   --  45* 155*  LABPROT 15.6* 18.4*  --  19.9* 19.7*  INR 1.23 1.51  --  1.67 1.65  HEPARINUNFRC 0.42 0.46  --   --  1.50*  CREATININE  --  0.78 0.81  --   --     Estimated Creatinine Clearance: 110 mL/min (by C-G formula based on SCr of 0.81 mg/dL).  Assessment: 66 yo male admitted with STEMI, now found to have left ventricular mural thrombus. Pharmacy consulted for heparin and warfarin dosing and monitoring.  Patient had Heparin gtt and warfarin discontinued on 2/12 and started on Apixaban. Per MD [facility] patient now wants to be back on warfarin therapy and she would like to restart Heparin gtt and would like to give warfarin 10 mg PO today. Patient received last dose of Apixaban @ ~09:00 on 2/13  Heparin course: 2/7 Heparin initiated at 1400 units/hr 2/9 AM Heparin increased to 1600 units/hr for HL of 0.20 2/9 12N HL 0.45 2/9 1800 HL=0.42 2/10 AM heparin level 0.42. Continue current regimen. 2/11 AM heparin level 0.42. Continue current regimen. Had Heparin gtt discontinued on 2/12 see above for information left   Coumadin: 2/8 INR 1.01  Warfarin 7.5mg  2/9 INR 1.09  Warfarin 7.5mg  2/10 INR 1.11  Warfarin 7.5 mg 2/11 INR 1.23  Warfarin 5mg  2/12 INR 1.51l; started on apixaban see note  2/13:                ; warfarin 10 mg PO (MD  Kalisetti's preference)   Goal of Therapy:  INR 2-3 Heparin level 0.3-0.7 units/ml Monitor platelets by anticoagulation protocol: Yes   Plan:  Will restart heparin gtt @ 1600 units/hr 12 hours post apixaban dose. Will need to dose base on APTT since HL likely will be elevated due to previous Apixaban dosing. APTT and HL @ 03:00 on 2/14.   MD Tressia Miners stated she would like to give patient a 10 mg of warfarin tonight. Will need to watch closely since patient is on concurrent interacting medications.  PT/INR with am labs.   2/14 0309 HL and aPTT both supratherapeutic. Asked RN to hold infusion x 1 hr, will restart at 1500 units/hr at 0600 and recheck aPTT in 6 hours.  Bryer Gottsch A. [place], [place].D., BCPS Clinical Pharmacist 03/17/2016 5:12 AM

## 2016-03-17 NOTE — Progress Notes (Signed)
[facility] [place] NAME: Edward [name]    MR#:  [id number]  DATE OF BIRTH:  Dec 14, 1950  SUBJECTIVE:  CHIEF COMPLAINT:  No chief complaint on file.  - back on coumadin again for intracardiac thrombus, INR at 1.65 - on heparin IV for bridging - no other complaints  REVIEW OF SYSTEMS:  Review of Systems  Constitutional: Negative for chills, diaphoresis, fever and malaise/fatigue.  HENT: Negative for ear discharge, ear pain, hearing loss and nosebleeds.   Eyes: Negative for blurred vision and double vision.  Respiratory: Negative for cough, shortness of breath and wheezing.   Cardiovascular: Negative for chest pain, palpitations and leg swelling.  Gastrointestinal: Negative for abdominal pain, constipation, diarrhea, nausea and vomiting.  Genitourinary: Negative for dysuria.  Musculoskeletal: Negative for myalgias.  Neurological: Negative for dizziness, speech change, focal weakness, seizures and headaches.    DRUG ALLERGIES:  No Known Allergies  VITALS:  Blood pressure 91/67, pulse (!) 57, temperature 98.6 F (37 C), temperature source Oral, resp. rate 16, height 5\' 11"  (1.803 m), weight 98.1 kg (216 lb 4.3 oz), SpO2 95 %.  PHYSICAL EXAMINATION:  Physical Exam  GENERAL:  66 y.o.-year-old patient lying in the bed with no acute distress.  EYES: Pupils equal, round, reactive to light and accommodation. No scleral icterus. Extraocular muscles intact.  HEENT: Head atraumatic, normocephalic. Oropharynx and nasopharynx clear.  NECK:  Supple, no jugular venous distention. No thyroid enlargement, no tenderness.  LUNGS: Normal breath sounds bilaterally, no wheezing, rales,rhonchi or crepitation. No use of accessory muscles of respiration.  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 S1, S2 normal. No murmurs, rubs, or gallops.  ABDOMEN: Soft, nontender, nondistended. Bowel sounds present. No organomegaly  or mass.  EXTREMITIES: No pedal edema, cyanosis, or clubbing.  NEUROLOGIC: Cranial nerves II through XII are intact. Muscle strength 5/5 in all extremities. Sensation intact. Gait not checked.  PSYCHIATRIC: The patient is alert and oriented x 3.  SKIN: No obvious rash, lesion, or ulcer.    LABORATORY PANEL:   CBC  Recent Labs Lab 03/17/16 0309  WBC 7.3  HGB 14.6  HCT 42.5  PLT 199   ------------------------------------------------------------------------------------------------------------------  Chemistries   Recent Labs Lab 03/16/16 0446  NA 136  K 4.2  CL 106  CO2 23  GLUCOSE 110*  BUN 14  CREATININE 0.81  CALCIUM 9.0   ------------------------------------------------------------------------------------------------------------------  Cardiac Enzymes No results for input(s): TROPONINI in the last 168 hours. ------------------------------------------------------------------------------------------------------------------  RADIOLOGY:  No results found.  EKG:   Orders placed or performed during the hospital encounter of 03/09/16  . EKG 12-Lead  . EKG 12-Lead  . ED EKG  . ED EKG  . EKG 12-Lead immediately post procedure  . EKG 12-Lead  . EKG 12-Lead immediately post procedure  . EKG 12-Lead  . EKG 12-Lead  . EKG 12-Lead    ASSESSMENT AND PLAN:   Edward [name]  is a 66 y.o. male with a known history of Anxiety, BPH, hypertension presents from home secondary to onset of chest pain and difficulty breathing- noted to have STEMI.  #1 STEMI- appreciate cardiology consult, s/p cardiac catheterization and LAD drug eluting stent placed - on asa, plavix, on coreg, loasrtan and statin - ECHO with EF at 30% - prn nitro  #2 LV thrombus- noted on ECHO -restarted on heparin drip with coumadin, monitor while on ABX - INR at 1.65 today - patient does not want to be on eliquis  #  3 Ischemic cardiomyopathy- secondary to STEMI, EF noted to be 30% On life vest. Repeat  ECHO after discharge - on coreg and losartan added  #4 Infectious diarrhea- diarrhea, stool with c.diff and E.coli - on flagyl and cipro - d/c cipro after 5 days and continue flagyl for 7 days after cipro  #5 DVT prophylaxis- coumadin and heparin drip  Ambulating well. Discharge once INR is therapeutic   All the records are reviewed and case discussed with Care Management/Social Workerr. Management plans discussed with the patient, family and they are in agreement.  CODE STATUS: Full Code  TOTAL TIME TAKING CARE OF THIS PATIENT: 36 minutes.   POSSIBLE D/C TOMORROW, DEPENDING ON CLINICAL CONDITION.   Gladstone Lighter M.D on 03/17/2016 at 12:23 PM  Between 7am to 6pm - Pager - [phone]  After 6pm go to [email or web address] - Proofreader  [facility]  Office  [phone]  CC: Primary care physician; Vernie Murders, PA

## 2016-03-17 NOTE — Progress Notes (Signed)
ANTICOAGULATION CONSULT NOTE - Follow Up Consult  Pharmacy Consult for Heparin and Warfarin bridging Indication: left ventricular mural thrombus  No Known Allergies  Patient Measurements: Height: 5\' 11"  (180.3 cm) Weight: 216 lb 4.3 oz (98.1 kg) IBW/kg (Calculated) : 75.3 Heparin Dosing Weight: 95.3 kg  Vital Signs: Temp: 97.6 F (36.4 C) (02/14 1952) Temp Source: Oral (02/14 1952) BP: 111/71 (02/14 1952) Pulse Rate: 70 (02/14 1952)  Labs:  Recent Labs  03/15/16 0708 03/16/16 0446  03/16/16 1119 03/17/16 0309 03/17/16 1248 03/17/16 2121  HGB 14.7 15.8  --   --  14.6  --   --   HCT 42.9 45.8  --   --  42.5  --   --   PLT 177 195  --   --  199  --   --   APTT  --   --   < > 45* 155* >160* 108*  LABPROT 18.4*  --   --  19.9* 19.7*  --   --   INR 1.51  --   --  1.67 1.65  --   --   HEPARINUNFRC 0.46  --   --   --  1.50*  --   --   CREATININE 0.78 0.81  --   --   --   --   --   < > = values in this interval not displayed. Estimated Creatinine Clearance: 110 mL/min (by C-G formula based on SCr of 0.81 mg/dL).  Assessment: 66 yo male admitted with STEMI, now found to have left ventricular mural thrombus. Pharmacy consulted for heparin and warfarin dosing and monitoring.  Patient had Heparin gtt and warfarin discontinued on 2/12 and started on Apixaban. Per MD [facility] patient now wants to be back on warfarin therapy and she would like to restart heparin drip. Patient received last dose of Apixaban @ ~09:00 on 2/13  Coumadin: 2/8 INR 1.01  Warfarin 7.5mg  2/9 INR 1.09  Warfarin 7.5mg  2/10 INR 1.11  Warfarin 7.5 mg 2/11 INR 1.23  Warfarin 5mg  2/12 INR 1.51l; started on apixaban see note  2/13: INR     1.67; warfarin 10 mg PO (MD Kalisetti's preference)  2/14: INR: 1.65; 7.5 mg   Goal of Therapy:  INR 2-3 Heparin level 0.3-0.7 units/ml Monitor platelets by anticoagulation protocol: Yes   Plan:  2/14 APTT >160 at 1248 (lab resulted at 1402) at an infusion rate of  1500 units/hr. Spoke with RN and stopped heparin drip ~1415. Will HOLD drip for 1 hour. RN denies any signs/symptoms of bleeding.   Will resume heparin drip at a rate of 1150 units/hr at 1530 this afternoon. Will check APTT in 6 hours. Plan communicated to RN.  2/14 aPTT @ 21:30 = 108.  Will continue this pt on current rate of 1150 units/hr and recheck aPTT and HL on 2/15 with AM labs.   Orene Desanctis, PharmD, Clinical Pharmacist 03/17/2016 10:14 PM

## 2016-03-18 DIAGNOSIS — F419 Anxiety disorder, unspecified: Secondary | ICD-10-CM

## 2016-03-18 LAB — APTT
APTT: 92 s — AB (ref 24–36)
aPTT: >160 seconds (ref 24–36)
aPTT: 96 seconds — ABNORMAL HIGH (ref 24–36)

## 2016-03-18 LAB — HEPARIN LEVEL (UNFRACTIONATED)
Heparin Unfractionated: 0.51 IU/mL (ref 0.30–0.70)
Heparin Unfractionated: 0.19 IU/mL — ABNORMAL LOW (ref 0.30–0.70)
Heparin Unfractionated: 0.19 IU/mL — ABNORMAL LOW (ref 0.30–0.70)

## 2016-03-18 LAB — PROTIME-INR
INR: 1.87
INR: 2.05
Prothrombin Time: 21.8 seconds — ABNORMAL HIGH (ref 11.4–15.2)
Prothrombin Time: 23.4 seconds — ABNORMAL HIGH (ref 11.4–15.2)

## 2016-03-18 MED ORDER — HEPARIN (PORCINE) IN NACL 100-0.45 UNIT/ML-% IJ SOLN
900.0000 [IU]/h | INTRAMUSCULAR | Status: DC
  Administered 2016-03-18: 950 [IU]/h via INTRAVENOUS
  Filled 2016-03-18: qty 250

## 2016-03-18 MED ORDER — WARFARIN SODIUM 7.5 MG PO TABS
7.5000 mg | ORAL_TABLET | Freq: Once | ORAL | Status: AC
  Administered 2016-03-18: 7.5 mg via ORAL
  Filled 2016-03-18: qty 1

## 2016-03-18 MED ORDER — HEPARIN (PORCINE) IN NACL 100-0.45 UNIT/ML-% IJ SOLN: 950.0000 [IU]/h | INTRAMUSCULAR | Status: DC

## 2016-03-18 NOTE — Progress Notes (Signed)
Nutrition Brief Note  Patient identified due to Length of stay  Wt Readings from Last 15 Encounters:  03/10/16 216 lb 4.3 oz (98.1 kg)  03/09/16 221 lb 12.8 oz (100.6 kg)  12/16/15 215 lb 6.4 oz (97.7 kg)  08/07/15 213 lb (96.6 kg)  12/10/14 211 lb 12.8 oz (96.1 kg)  08/08/14 230 lb (104.3 kg)  07/30/14 223 lb 3.2 oz (101.2 kg)    Body mass index is 30.16 kg/m. Patient meets criteria for obese based on current BMI.   Current diet order is heart healthy, patient is consuming approximately 100% of meals at this time. Labs and medications reviewed.   No nutrition interventions warranted at this time. If nutrition issues arise, please consult RD.   Satira Anis. Alejo Beamer, MS, RD LDN Inpatient Clinical Dietitian Pager [phone]

## 2016-03-18 NOTE — Progress Notes (Signed)
Heparin drip continues at 9.70ml/hr,coumadin continues  And awaiting INR to be therapeutic before discharge

## 2016-03-18 NOTE — Progress Notes (Addendum)
[name] NAME: Edward [name]    MR#:  [id number]  DATE OF BIRTH:  09-Sep-1950  SUBJECTIVE:  CHIEF COMPLAINT:  No chief complaint on file.  - back on coumadin again for intracardiac thrombus, INR at 1.87 - on heparin IV for bridging - no other complaints,Resting comfortably. Diarrhea improved  REVIEW OF SYSTEMS:  Review of Systems  Constitutional: Negative for chills, diaphoresis, fever and malaise/fatigue.  HENT: Negative for ear discharge, ear pain, hearing loss and nosebleeds.   Eyes: Negative for blurred vision and double vision.  Respiratory: Negative for cough, shortness of breath and wheezing.   Cardiovascular: Negative for chest pain, palpitations and leg swelling.  Gastrointestinal: Negative for abdominal pain, constipation, diarrhea, nausea and vomiting.  Genitourinary: Negative for dysuria.  Musculoskeletal: Negative for myalgias.  Neurological: Negative for dizziness, speech change, focal weakness, seizures and headaches.    DRUG ALLERGIES:  No Known Allergies  VITALS:  Blood pressure 108/84, pulse (!) 54, temperature 97.8 F (36.6 C), temperature source Oral, resp. rate 20, height 5\' 11"  (1.803 m), weight 98.1 kg (216 lb 4.3 oz), SpO2 98 %.  PHYSICAL EXAMINATION:  Physical Exam  GENERAL:  66 y.o.-year-old patient lying in the bed with no acute distress.  EYES: Pupils equal, round, reactive to light and accommodation. No scleral icterus. Extraocular muscles intact.  HEENT: Head atraumatic, normocephalic. Oropharynx and nasopharynx clear.  NECK:  Supple, no jugular venous distention. No thyroid enlargement, no tenderness.  LUNGS: Normal breath sounds bilaterally, no wheezing, rales,rhonchi or crepitation. No use of accessory muscles of respiration.  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 S1, S2 normal. No murmurs, rubs, or gallops.  ABDOMEN: Soft, nontender,  nondistended. Bowel sounds present. No organomegaly or mass.  EXTREMITIES: No pedal edema, cyanosis, or clubbing.  NEUROLOGIC: Cranial nerves II through XII are intact. Muscle strength 5/5 in all extremities. Sensation intact. Gait not checked.  PSYCHIATRIC: The patient is alert and oriented x 3.  SKIN: No obvious rash, lesion, or ulcer.    LABORATORY PANEL:   CBC  Recent Labs Lab 03/17/16 0309  WBC 7.3  HGB 14.6  HCT 42.5  PLT 199   ------------------------------------------------------------------------------------------------------------------  Chemistries   Recent Labs Lab 03/16/16 0446  NA 136  K 4.2  CL 106  CO2 23  GLUCOSE 110*  BUN 14  CREATININE 0.81  CALCIUM 9.0   ------------------------------------------------------------------------------------------------------------------  Cardiac Enzymes No results for input(s): TROPONINI in the last 168 hours. ------------------------------------------------------------------------------------------------------------------  RADIOLOGY:  No results found.  EKG:   Orders placed or performed during the hospital encounter of 03/09/16  . EKG 12-Lead  . EKG 12-Lead  . ED EKG  . ED EKG  . EKG 12-Lead immediately post procedure  . EKG 12-Lead  . EKG 12-Lead immediately post procedure  . EKG 12-Lead  . EKG 12-Lead  . EKG 12-Lead    ASSESSMENT AND PLAN:   Edward [name]  is a 66 y.o. male with a known history of Anxiety, BPH, hypertension presents from home secondary to onset of chest pain and difficulty breathing- noted to have STEMI.  #1 STEMI- appreciate [facility] cardiology consult, s/p cardiac catheterization and LAD drug eluting stent placed - on asa 81 mg  plavix, on coreg, loasrtan and statin - ECHO with EF at 30% - prn nitro  #2 LV thrombus- noted on ECHO -restarted on heparin drip with coumadin, monitor while on ABX - INR at 1.87today. Planning to discharge  once INR is therapeutic - patient refused  to be  on eliquis  #3 Ischemic cardiomyopathy- secondary to STEMI, EF noted to be 30% On life vest. Repeat ECHO after discharge at cardiology office - on coreg and losartan added  #4 Infectious diarrhea- diarrhea, stool with c.diff and E.coli - on flagyl and cipro - d/c cipro after 5 days and continue flagyl for 7 days after cipro  #5 DVT prophylaxis- coumadin and heparin drip for bridging  Discharge once INR is therapeutic  Physical therapy consult placed All the records are reviewed and case discussed with Care Management/Social Workerr. Management plans discussed with the patient, family and they are in agreement.  CODE STATUS: Full Code  TOTAL TIME TAKING CARE OF THIS PATIENT: 30 minutes.   POSSIBLE D/C TOMORROW, DEPENDING ON CLINICAL CONDITION.   [name] M.D on 03/18/2016 at 1:41 PM  Between 7am to 6pm - Pager - [phone]   After 6pm go to [email or web address] - Proofreader  [facility]  Office  [phone]  CC: Primary care physician; Vernie Murders, PA

## 2016-03-18 NOTE — Care Management (Signed)
Barrier to discharge- INR is not therapeutic

## 2016-03-18 NOTE — Progress Notes (Signed)
ANTICOAGULATION CONSULT NOTE - Follow Up Consult  Pharmacy Consult for Heparin and Warfarin bridging Indication: left ventricular mural thrombus  No Known Allergies  Patient Measurements: Height: 5\' 11"  (180.3 cm) Weight: 216 lb 4.3 oz (98.1 kg) IBW/kg (Calculated) : 75.3 Heparin Dosing Weight: 95.3 kg  Vital Signs: Temp: 98.3 F (36.8 C) (02/14 1231) Temp Source: Oral (02/14 1231) BP: 100/68 (02/14 1231) Pulse Rate: 54 (02/14 1231)  Labs:  Recent Labs (last 2 labs)    Recent Labs  03/15/16 0708 03/16/16 0446 03/16/16 1119 03/17/16 0309 03/17/16 1248  HGB 14.7 15.8  --  14.6  --   HCT 42.9 45.8  --  42.5  --   PLT 177 195  --  199  --   APTT  --   --  45* 155* >160*  LABPROT 18.4*  --  19.9* 19.7*  --   INR 1.51  --  1.67 1.65  --   HEPARINUNFRC 0.46  --   --  1.50*  --   CREATININE 0.78 0.81  --   --   --      Estimated Creatinine Clearance: 110 mL/min (by C-G formula based on SCr of 0.81 mg/dL).  Assessment: 66 yo male admitted with STEMI, now found to haveleft ventricular mural thrombus. Pharmacy consulted for heparin and warfarin dosing and monitoring.  Patient had Heparin gtt and warfarin discontinued on 2/12 and started on Apixaban. Per MD [facility] patient now wants to be back on warfarin therapy and she would like to restart heparin drip. Patient received last dose of Apixaban @ ~09:00 on 2/13 2/14 APTT >160 at 1248 heparin held for 1 hour, resumed at a 1150 units/hr. 2/15 0530 aPTT > 160s, HL 0.51. Heparin held for one hour and resumed at 950 units/hour. 2/15 1500 aPTT 96 and HL 0.19.    Coumadin: 2/8 INR 1.01  Warfarin 7.5mg  2/9 INR 1.09  Warfarin 7.5mg  2/10 INR 1.11  Warfarin 7.5 mg 2/11 INR 1.23  Warfarin 5mg  2/12 INR 1.51  started on apixaban see note  2/13 INR 1.67  warfarin 10 mg PO (MD Kalisetti's preference)  2/14 INR 1.65  Warfarin 7.5 mg  2/15 INR 1.87  Goal of Therapy:  INR 2-3 Heparin level 0.3-0.7 units/ml Monitor  platelets by anticoagulation protocol: Yes   Plan:  Spoke with cardiologist Dr. Rockey Situ this morning. He would like to keep Heparin at the lower end of therapeutic so will continue with current rate of 950 units/hr. Will recheck aPTT and HL in 6 hours.  Anticipate that INR will be therapeutic in the morning and will be able to stop Heparin infusion. Will give Warfarin 7.5mg  today.  Thank you for this consult.  Paulina Fusi, PharmD, BCPS 03/18/2016 5:04 PM

## 2016-03-18 NOTE — Progress Notes (Addendum)
ANTICOAGULATION CONSULT NOTE - Follow Up Consult  Pharmacy Consult for Heparin and Warfarin bridging Indication: left ventricular mural thrombus  No Known Allergies  Patient Measurements: Height: 5\' 11"  (180.3 cm) Weight: 216 lb 4.3 oz (98.1 kg) IBW/kg (Calculated) : 75.3 Heparin Dosing Weight: 95.3 kg  Vital Signs: Temp: 98.3 F (36.8 C) (02/14 1231) Temp Source: Oral (02/14 1231) BP: 100/68 (02/14 1231) Pulse Rate: 54 (02/14 1231)  Labs:  Recent Labs (last 2 labs)    Recent Labs  03/15/16 0708 03/16/16 0446 03/16/16 1119 03/17/16 0309 03/17/16 1248  HGB 14.7 15.8  --  14.6  --   HCT 42.9 45.8  --  42.5  --   PLT 177 195  --  199  --   APTT  --   --  45* 155* >160*  LABPROT 18.4*  --  19.9* 19.7*  --   INR 1.51  --  1.67 1.65  --   HEPARINUNFRC 0.46  --   --  1.50*  --   CREATININE 0.78 0.81  --   --   --      Estimated Creatinine Clearance: 110 mL/min (by C-G formula based on SCr of 0.81 mg/dL).  Assessment: 66 yo male admitted with STEMI, now found to haveleft ventricular mural thrombus. Pharmacy consulted for heparin and warfarin dosing and monitoring.  Patient had Heparin gtt and warfarin discontinued on 2/12 and started on Apixaban. Per MD [facility] patient now wants to be back on warfarin therapy and she would like to restart heparin drip. Patient received last dose of Apixaban @ ~09:00 on 2/13  Coumadin: 2/8 INR 1.01  Warfarin 7.5mg  2/9 INR 1.09  Warfarin 7.5mg  2/10 INR 1.11  Warfarin 7.5 mg 2/11 INR 1.23  Warfarin 5mg  2/12 INR 1.51l; started on apixaban see note  2/13: INR     1.67; warfarin 10 mg PO (MD Kalisetti's preference)  2/14: INR: 1.65; 7.5 mg   Goal of Therapy:  INR 2-3 Heparin level 0.3-0.7 units/ml Monitor platelets by anticoagulation protocol: Yes   Plan:  2/14 APTT >160 at 1248 (lab resulted at 1402) at an infusion rate of 1500 units/hr. Spoke with RN and stopped heparin drip ~1415. Will HOLD drip for 1 hour. RN denies  any signs/symptoms of bleeding.   Will resume heparin drip at a rate of 1150 units/hr at 1530 this afternoon. Will check APTT in 6 hours. Plan communicated to RN.  2/14 2121 aPTT slightly supratherapeutic, however large decrease this afternoon. Continue current rate. Will recheck HL and aPTT with AM labs  2/15 0530 aPTT > 160s, HL 0.51. Will stop heparin drip for one hour and then will decrease rate to 950 units/hour and will recheck aPTT/HL @ 1500.  Thank you for this consult.  Tobie Lords, PharmD, BCPS Clinical Pharmacist 03/18/2016

## 2016-03-18 NOTE — Progress Notes (Signed)
Patient Name: Edward [name] Date of Encounter: 03/18/2016  Primary Cardiologist: New to [facility] - consult by End  Hospital Problem List     Principal Problem:   ST elevation myocardial infarction (STEMI) [facility]) Active Problems:   Ischemic cardiomyopathy   Smoker   Mixed hyperlipidemia   Encounter for anticoagulation discussion and counseling   Clostridium difficile diarrhea     Subjective   No acute overnight events. INR 1.87 this morning. PTT elevated this AM. RN has discussed with pharmacy. No chest pain.   Inpatient Medications    Scheduled Meds: . aspirin  81 mg Oral Daily  . atorvastatin  80 mg Oral q1800  . carvedilol  3.125 mg Oral BID WC  . ciprofloxacin  500 mg Oral BID  . clopidogrel  75 mg Oral Daily  . famotidine  20 mg Oral BID  . losartan  12.5 mg Oral Daily  . metroNIDAZOLE  500 mg Oral Q8H  . sodium chloride flush  3 mL Intravenous Q12H  . spironolactone  12.5 mg Oral Daily  . Warfarin - Pharmacist Dosing Inpatient   Does not apply q1800   Continuous Infusions: . sodium chloride 1,000 mL/hr (03/09/16 1337)  . heparin 1,150 Units/hr (03/17/16 1930)   PRN Meds: sodium chloride, acetaminophen **OR** acetaminophen, ondansetron (ZOFRAN) IV, ondansetron **OR** ondansetron (ZOFRAN) IV, phenol-menthol, sodium chloride flush   Vital Signs    Vitals:   03/17/16 0828 03/17/16 1231 03/17/16 1952 03/18/16 0551  BP: 91/67 100/68 111/71 112/71  Pulse: (!) 57 (!) 54 70 (!) 54  Resp: 16 12 18 16   Temp: 98.6 F (37 C) 98.3 F (36.8 C) 97.6 F (36.4 C) 98.3 F (36.8 C)  TempSrc: Oral Oral Oral   SpO2: 95% 97% 96% 97%  Weight:      Height:        Intake/Output Summary (Last 24 hours) at 03/18/16 0718 Last data filed at 03/18/16 0600  Gross per 24 hour  Intake           492.88 ml  Output             1000 ml  Net          -507.12 ml   Filed Weights   03/10/16 0400  Weight: 216 lb 4.3 oz (98.1 kg)    Physical Exam    GEN: Well nourished, well  developed, in no acute distress.  HEENT: Grossly normal.  Neck: Supple, no JVD, carotid bruits, or masses. Cardiac: RRR, no murmurs, rubs, or gallops. No clubbing, cyanosis, edema.  Radials/DP/PT 2+ and equal bilaterally.  Respiratory:  Respirations regular and unlabored, clear to auscultation bilaterally. GI: Soft, nontender, nondistended, BS + x 4. MS: no deformity or atrophy. Skin: warm and dry, no rash. Neuro:  Strength and sensation are intact. Psych: AAOx3.  Normal affect.  Labs    CBC  Recent Labs  03/16/16 0446 03/17/16 0309  WBC 7.4 7.3  HGB 15.8 14.6  HCT 45.8 42.5  MCV 94.5 94.4  PLT 195 [id number]   Basic Metabolic Panel  Recent Labs  03/16/16 0446  NA 136  K 4.2  CL 106  CO2 23  GLUCOSE 110*  BUN 14  CREATININE 0.81  CALCIUM 9.0   Liver Function Tests No results for input(s): AST, ALT, ALKPHOS, BILITOT, PROT, ALBUMIN in the last 72 hours. No results for input(s): LIPASE, AMYLASE in the last 72 hours. Cardiac Enzymes No results for input(s): CKTOTAL, CKMB, CKMBINDEX, TROPONINI in the last  72 hours. BNP Invalid input(s): POCBNP D-Dimer No results for input(s): DDIMER in the last 72 hours. Hemoglobin A1C No results for input(s): HGBA1C in the last 72 hours. Fasting Lipid Panel No results for input(s): CHOL, HDL, LDLCALC, TRIG, CHOLHDL, LDLDIRECT in the last 72 hours. Thyroid Function Tests No results for input(s): TSH, T4TOTAL, T3FREE, THYROIDAB in the last 72 hours.  Invalid input(s): FREET3  Telemetry    Sinus rhythm 60's to sinus bradycardia 50's bpm - Personally Reviewed  ECG    n/a - Personally Reviewed  Radiology    No results found.  Cardiac Studies   LHC 03/09/16: Conclusion   Conclusions: 1. Late presenting STEMI (symptom onset ~18 hours ago) with thrombotic occlusion of the proximal/mid LAD involving D1 and D2. 2. Mild to moderate, non-obstructive CAD involving LCx and RCA. 3. Successful IVUS-guided PCI to LAD proximal and  mid LAD using an Integrity Resolute 3.0 x 38 mm drug-eluting stent (post-dilated up to 4.0 mm) with 0% residual stenosis and TIMI-flow. D2 was jailed by the LAD stent resulting in 70% ostial stenosis with TIMI-3 flow.  Recommendations: 1. Admit to ICU, hospitalist service. 2. Continue ticagrelor infusion x 6 hours. 3. Dual antiplatelet therapy with ASA and ticagrelor for at least 12 months. 4. Cycle troponins until they have peaked, then stop. 5. Obtain transthoracic echocardiogram. 6. Change metoprolol to carvedilol 3.125 mg BID, to be uptitrated as HR and BP allow. 7. Plan to add ACEI before discharge. 8. Close monitoring and counseling regarding extended fluoroscopy time and radiation exposure    Echo 03/10/16: Study Conclusions  - Procedure narrative: Transthoracic echocardiography. The study was technically difficult. Intravenous contrast (Definity) was administered. - Left ventricle: The cavity size was normal. Wall thickness could not be accurately determined. Systolic function was moderately to severely reduced. The estimated ejection fraction was in the range of 30% to 35%. Doppler parameters are consistent with abnormal left ventricular relaxation (grade 1 diastolic dysfunction). Mural thrombus is suspected at the left ventricular apex. - Left atrium: The atrium was mildly dilated. - Right ventricle: The cavity size was normal. Systolic function was normal. - Right atrium: The atrium was mildly to moderately dilated. - Pericardium, extracardiac: A trivial pericardial effusion was identified.  Impressions:  - 1. Moderate to severe LV dysfuction with LAD territory infarct. LVEF 30-35%. 2. Likely mural thrombus at the left ventricular apex. 3. Limited evaluation of the cardiac valve due to lack of parasternal windows.   Patient Profile     66 y.o. male with history of HTN who presented to [facility] on 2/6 after presenting to his  PCP eariler in the day with chest pain that began on 2/5 that was similar to chest pain he had 3-4 weeks prior was found to have a late-presenting anterior ST elevation MI s/p IVUS-guided PCI/DES to the proximal and mid LAD as detailed above as well as LV mural thrombus on echo. Hospital stay has been complicated by prolonged heparin to therapeutic Coumadin bridge as well as development of C diff as well as E coli colitis now on Cipro and Flagyl.   Assessment & Plan    1. Anterior ST elevation MI: -Late presenting, possibly felt to have had subtotal occlusion in the past with his acute pain beginning last night due to acute plaque rupture and thrombus formation (type I MI). Pain has resolved with PCI. Moderate size D2 was jailed by the stent with ~70% ostial stenosis but TIMI-3 flow. D1 is a moderate-caliber branch that appears chronically  occluded. There is also mild to moderate disease involving the LCx and RCA, which will need to be treated medically. -Continue DAPT with ASA 81 mg and Plavix (changed from Brilinta to Plavix upon initiation of triple therapy with Coumadin-->Eliquis on 2/12-->back to Coumadin 2/13), likely discontinue ASA vs Plavix in the future with continuation of antocoagulation -Echo with reduced EF as above at 30-35%-->wearing LifeVest (no alarms or discharges) -Lipitor, Coreg, losartan -Continue spironolactone 12.5 mg daily -Currently chest pain free -Cardiac rehab  2. ICM: -He does not appear volume overloaded at this time -Coreg as above, titrate as tolerated-->hold Coreg this AM 2/2 bradycardia -Echo showed an EF of 30-35%, wearing LifeVest without any noted alarms or discharges  -Will need to optimize HF medications as an outpatient -Will need repeat echo as an outpatient in 90 days to evaluate for improved LVSF. If LVSF remains < 35% will need to see EP for evaluation of ICD -May need prn Lasix  3. LV mural thrombus: -On Coumadin/heparin bridge slow to reach  therapeutic goal, complicated by C diff and E coli colitison Cipro and Flagyl  -Again discussed with him the idea of taking Lovenox injections on 2/12 as an outpatient to bridge to therapeutic INR to allow for discharge, he does not want to pursue Lovenox injections given a reported poor outcome with himself giving his mother Lovenox injections in the past -Started on Eliquis in place of Coumadin on 2/12, did not tolerate Eliquis, back on heparin gto Coumadin bridge on 2/13 without issues -Await therapeutic INR of 2.0-2.5 (given triple therapy)   4. C diff and E coli colitis: -On Cipro and Flagyl -May need to assess for sepsis   5. HLD: LDL not at goal -Lipitor as above -Will need follow up lipid and liver in 8 weeks as an outpatient   Signed, Marcille Blanco [place] Pager: [phone] 03/18/2016, 7:18 AM   Attending Note Patient seen and examined, agree with detailed note above,  Patient presentation and plan discussed on rounds.   Reports that he feels well as morning, continued anxiety Walking without issues Long discussion concerning his cardiac issues, INR almost therapeutic Discussed case with pharmacy They will lower the rate of his heparin as INR almost therapeutic Would likely be able to wean off low rate heparin in the morning once INR 2.0 or greater  Diarrhea improving though still having waxing waning issues He does report having several months of profound diarrhea over the summer 2017, did not seek medical assistance when. Diarrhea seemed to go away and now has come back again C. difficile positive with Escherichia coli  On clinical exam lungs are clear to auscultation bilaterally, no JVP, heart sounds regular with no murmurs, abdomen soft nontender, no significant lower extremity edema  Lab work reviewed, stable CBC  ---STEMI Tolerating aspirin, Plavix, heparin, warfarin We will wean off heparin likely in the morning Being managed by  pharmacy Tolerating losartan, carvedilol, Aldactone, Lipitor Medications discussed with him in detail, He is a Theatre stage manager, likes to avoid medications Strongly recommended he remain compliant  ----Diarrhea Interestingly he volunteered several months of diarrhea over the summer 2017 for which he did not seek medical attention. Symptoms at that time were profound Unable to exclude association from his summer time symptoms and recurrence of his symptoms now   Case discussed with hospitalist service, and with pharmacy Long discussion with patient as above Greater than 50% was spent in counseling and coordination of care with patient Total encounter time 35  minutes or more   Signed: Esmond Plants  M.D., Ph.D. [facility]

## 2016-03-19 ENCOUNTER — Other Ambulatory Visit: Payer: Self-pay

## 2016-03-19 ENCOUNTER — Telehealth: Payer: Self-pay | Admitting: Family Medicine

## 2016-03-19 ENCOUNTER — Telehealth: Payer: Self-pay | Admitting: Internal Medicine

## 2016-03-19 ENCOUNTER — Telehealth: Payer: Self-pay | Admitting: Cardiovascular Disease

## 2016-03-19 DIAGNOSIS — I213 ST elevation (STEMI) myocardial infarction of unspecified site: Secondary | ICD-10-CM

## 2016-03-19 LAB — CBC
HEMATOCRIT: 41.4 % (ref 40.0–52.0)
HEMOGLOBIN: 14.4 g/dL (ref 13.0–18.0)
MCH: 32.9 pg (ref 26.0–34.0)
MCHC: 34.9 g/dL (ref 32.0–36.0)
MCV: 94.3 fL (ref 80.0–100.0)
Platelets: 212 10*3/uL (ref 150–440)
RBC: 4.39 MIL/uL — AB (ref 4.40–5.90)
RDW: 13.2 % (ref 11.5–14.5)
WBC: 7.1 10*3/uL (ref 3.8–10.6)

## 2016-03-19 LAB — PROTIME-INR
INR: 2.31
Prothrombin Time: 25.8 seconds — ABNORMAL HIGH (ref 11.4–15.2)

## 2016-03-19 LAB — HEPARIN LEVEL (UNFRACTIONATED): Heparin Unfractionated: 0.25 IU/mL — ABNORMAL LOW (ref 0.30–0.70)

## 2016-03-19 LAB — APTT: aPTT: 137 seconds — ABNORMAL HIGH (ref 24–36)

## 2016-03-19 MED ORDER — CLOPIDOGREL BISULFATE 75 MG PO TABS: 75.0000 mg | ORAL_TABLET | Freq: Every day | ORAL | 0 refills | Status: DC

## 2016-03-19 MED ORDER — WARFARIN SODIUM 5 MG PO TABS: 5.0000 mg | ORAL_TABLET | Freq: Every day | ORAL | 0 refills | Status: DC

## 2016-03-19 MED ORDER — ASPIRIN 81 MG PO CHEW: 81.0000 mg | CHEWABLE_TABLET | Freq: Every day | ORAL | Status: DC

## 2016-03-19 MED ORDER — SPIRONOLACTONE 25 MG PO TABS
12.5000 mg | ORAL_TABLET | Freq: Every day | ORAL | 0 refills | Status: DC
Start: 2016-03-20 — End: 2016-04-14

## 2016-03-19 MED ORDER — ATORVASTATIN CALCIUM 80 MG PO TABS: 80.0000 mg | ORAL_TABLET | Freq: Every day | ORAL | 0 refills | Status: DC

## 2016-03-19 MED ORDER — LOSARTAN POTASSIUM 25 MG PO TABS: 12.5000 mg | ORAL_TABLET | Freq: Every day | ORAL | 0 refills | Status: DC

## 2016-03-19 MED ORDER — CIPROFLOXACIN HCL 500 MG PO TABS: 500.0000 mg | ORAL_TABLET | Freq: Two times a day (BID) | ORAL | 0 refills | Status: DC

## 2016-03-19 MED ORDER — WARFARIN SODIUM 5 MG PO TABS
5.0000 mg | ORAL_TABLET | Freq: Every day | ORAL | Status: DC
  Administered 2016-03-19: 5 mg via ORAL
  Filled 2016-03-19: qty 1

## 2016-03-19 MED ORDER — CARVEDILOL 3.125 MG PO TABS: 3.1250 mg | ORAL_TABLET | Freq: Two times a day (BID) | ORAL | 0 refills | Status: DC

## 2016-03-19 MED ORDER — METRONIDAZOLE 500 MG PO TABS: 500.0000 mg | ORAL_TABLET | Freq: Three times a day (TID) | ORAL | 0 refills | Status: DC

## 2016-03-19 NOTE — Plan of Care (Signed)
Problem: Safety: Goal: Ability to remain free from injury will improve Outcome: Completed/Met Date Met: 03/19/16 Pt no with falls/injury. No bed alarm needed, moderate fall risk.  Problem: Pain Managment: Goal: General experience of comfort will improve Outcome: Completed/Met Date Met: 03/19/16 No complaints of pain this shift.  Problem: Activity: Goal: Risk for activity intolerance will decrease Outcome: Progressing Pt very active, pt has walked multiple times this shift. Independent and tolerated well.

## 2016-03-19 NOTE — Progress Notes (Signed)
MD notified of patient HR in SB at times. MD Gollan instructed to give coreg. I will continue to assess.

## 2016-03-19 NOTE — Progress Notes (Signed)
PT Cancellation Note  Patient Details Name: Edward [name] MRN: [id number] DOB: 01-Jul-1950   Cancelled Treatment:    Reason Eval/Treat Not Completed: PT screened, no needs identified, will sign off. PT reviewed chart, discussed case with patient. He reports he walked 13 laps around the RN station x 2 for the past few days. Denies balance deficits, reports he watched his HR maintained in the 80s during ambulation. At this time he does not appear to have any acute mobility deficits, no needs identified PT will sign off.    Royce Macadamia PT, DPT, CSCS    03/19/2016, 11:20 AM

## 2016-03-19 NOTE — Progress Notes (Addendum)
Patient: Edward [name] / Admit Date: 03/09/2016 / Date of Encounter: 03/19/2016, 2:34 PM   Hospital Problem List     Principal Problem:   ST elevation myocardial infarction (STEMI) [facility]) Active Problems:   Ischemic cardiomyopathy   Smoker   Mixed hyperlipidemia   Encounter for anticoagulation discussion and counseling   Clostridium difficile diarrhea     Subjective   Very anxious this morning, nurses told him his heart rate was running low while he was sleeping, was in the 40s. While a rate in the 80s before Coreg given. He is concerned that is a problem. Discussed case with pharmacy, plan for INR on Monday in our office Scheduled for 9:45 in the morning Our office location discussed with him He denies any significant chest pain   Inpatient Medications    Scheduled Meds: . aspirin  81 mg Oral Daily  . atorvastatin  80 mg Oral q1800  . carvedilol  3.125 mg Oral BID WC  . ciprofloxacin  500 mg Oral BID  . clopidogrel  75 mg Oral Daily  . famotidine  20 mg Oral BID  . losartan  12.5 mg Oral Daily  . metroNIDAZOLE  500 mg Oral Q8H  . sodium chloride flush  3 mL Intravenous Q12H  . spironolactone  12.5 mg Oral Daily  . warfarin  5 mg Oral q1800  . Warfarin - Pharmacist Dosing Inpatient   Does not apply q1800   Continuous Infusions: . sodium chloride 1,000 mL/hr (03/09/16 1337)   PRN Meds: sodium chloride, acetaminophen **OR** acetaminophen, ondansetron (ZOFRAN) IV, ondansetron **OR** ondansetron (ZOFRAN) IV, phenol-menthol, sodium chloride flush   Objective: Telemetry:  Physical Exam: Blood pressure 104/61, pulse (!) 58, temperature 98 F (36.7 C), temperature source Oral, resp. rate 20, height 5\' 11"  (1.803 m), weight 216 lb 4.3 oz (98.1 kg), SpO2 97 %. Body mass index is 30.16 kg/m. GEN: Well nourished, well developed, in no acute distress.  HEENT: Grossly normal.  Neck: Supple, no JVD, carotid bruits, or masses. Cardiac: RRR, no murmurs, rubs, or gallops. No  clubbing, cyanosis, edema.  Radials/DP/PT 2+ and equal bilaterally.  Respiratory:  Respirations regular and unlabored, clear to auscultation bilaterally. GI: Soft, nontender, nondistended, BS + x 4. MS: no deformity or atrophy. Skin: warm and dry, no rash. Neuro:  Strength and sensation are intact. Psych: AAOx3.  Normal affect, anxious   Intake/Output Summary (Last 24 hours) at 03/19/16 1434 Last data filed at 03/19/16 1010  Gross per 24 hour  Intake           761.17 ml  Output             2650 ml  Net         -1888.83 ml     Labs: CBC  Recent Labs  03/17/16 0309 03/19/16 0545  WBC 7.3 7.1  HGB 14.6 14.4  HCT 42.5 41.4  MCV 94.4 94.3  PLT 199 [id number]   Basic Metabolic Panel No results for input(s): NA, K, CL, CO2, GLUCOSE, BUN, CREATININE, CALCIUM, MG, PHOS in the last 72 hours. Liver Function Tests No results for input(s): AST, ALT, ALKPHOS, BILITOT, PROT, ALBUMIN in the last 72 hours. No results for input(s): LIPASE, AMYLASE in the last 72 hours. Cardiac Enzymes No results for input(s): CKTOTAL, CKMB, CKMBINDEX, TROPONINI in the last 72 hours. BNP Invalid input(s): POCBNP D-Dimer No results for input(s): DDIMER in the last 72 hours. Hemoglobin A1C No results for input(s): HGBA1C in the last 72 hours.  Fasting Lipid Panel No results for input(s): CHOL, HDL, LDLCALC, TRIG, CHOLHDL, LDLDIRECT in the last 72 hours. Thyroid Function Tests No results for input(s): TSH, T4TOTAL, T3FREE, THYROIDAB in the last 72 hours.  Invalid input(s): FREET3  Weights: Filed Weights   03/10/16 0400  Weight: 216 lb 4.3 oz (98.1 kg)     Radiology/Studies:  No results found.   Assessment and Plan  66 y.o. male  ---STEMI Tolerating aspirin, Plavix, warfarin INR greater than 2 Tolerating losartan, carvedilol, Aldactone, Lipitor Medications discussed with him in detail, Follow up with Dr. Gerald Stabs End in clinic INr check Monday 2/19 at 9:45 AM  ----Anxiety Will likely require  cardiac rehabilitation, reassurance  ----Diarrhea  several months of diarrhea over the summer 2017 for which he did not seek medical attention.  Symptoms seem to get better, recurred now with C. difficile, Escherichia coli Management per medicine team     Total encounter time more than 25 minutes  Greater than 50% was spent in counseling and coordination of care with the patient   Signed, Esmond Plants, MD, Ph.D. [facility] 03/19/2016, 2:34 PM

## 2016-03-19 NOTE — Progress Notes (Signed)
ANTICOAGULATION CONSULT NOTE - Follow Up Consult  Pharmacy Consult for Heparin and Warfarin bridging Indication: left ventricular mural thrombus  No Known Allergies  Assessment: 66 yo male admitted with STEMI, found to haveleft ventricular mural thrombus. Pharmacy consulted for heparin and warfarin dosing and monitoring.   Coumadin: 2/8 INR 1.01  Warfarin 7.5mg  2/9 INR 1.09  Warfarin 7.5mg  2/10 INR 1.11  Warfarin 7.5 mg 2/11 INR 1.23  Warfarin 5mg  2/12 INR 1.51  started on apixaban see note  2/13 INR 1.67  warfarin 10 mg PO (MD Kalisetti's preference)  2/14 INR 1.65  Warfarin 7.5 mg  2/15 INR 1.87  Warfarin 7.5mg  2/16 INR 2.31  Goal of Therapy:  INR 2-3 Heparin level 0.3-0.7 units/ml Monitor platelets by anticoagulation protocol: Yes   Plan:  Patient is now therapeutic on Warfarin with an INR of 2.31. After discussion with MD Rockey Situ will discontinue Heparin drip. Recommend lowering warfarin dose to 5mg  daily for discharge with INR check on Monday. Patient is also on Cipro and Flagyl so will need to keep a close check on INR until these are discontinued and patient INR is stable.  Paulina Fusi, PharmD, BCPS 03/19/2016 8:59 AM

## 2016-03-19 NOTE — Telephone Encounter (Signed)
Per verbal from Dr. Rockey Situ, pt needs INR check in the office Monday morning. He is currently admitted and will be starting coumadin today. Scheduled 2/19, 9:45am Notified Julisa, 2A, who will make sure information is given to patient at discharge.

## 2016-03-19 NOTE — Telephone Encounter (Signed)
TCM...patient was seen in hospital by Dr End is being discharged soon. They are coming on 03/24/16 at 11:15am for Dr End

## 2016-03-19 NOTE — Progress Notes (Addendum)
ANTICOAGULATION CONSULT NOTE - Follow Up Consult  Pharmacy Consult for Heparin and Warfarin bridging Indication: left ventricular mural thrombus  No Known Allergies  Patient Measurements: Height: 5\' 11"  (180.3 cm) Weight: 216 lb 4.3 oz (98.1 kg) IBW/kg (Calculated) : 75.3 Heparin Dosing Weight: 95.3 kg  Vital Signs: Temp: 98.3 F (36.8 C) (02/14 1231) Temp Source: Oral (02/14 1231) BP: 100/68 (02/14 1231) Pulse Rate: 54 (02/14 1231)  Labs:  Recent Labs (last 2 labs)    Recent Labs  03/15/16 0708 03/16/16 0446 03/16/16 1119 03/17/16 0309 03/17/16 1248  HGB 14.7 15.8  --  14.6  --   HCT 42.9 45.8  --  42.5  --   PLT 177 195  --  199  --   APTT  --   --  45* 155* >160*  LABPROT 18.4*  --  19.9* 19.7*  --   INR 1.51  --  1.67 1.65  --   HEPARINUNFRC 0.46  --   --  1.50*  --   CREATININE 0.78 0.81  --   --   --      Estimated Creatinine Clearance: 110 mL/min (by C-G formula based on SCr of 0.81 mg/dL).  Assessment: 66 yo male admitted with STEMI, now found to haveleft ventricular mural thrombus. Pharmacy consulted for heparin and warfarin dosing and monitoring.  Patient had Heparin gtt and warfarin discontinued on 2/12 and started on Apixaban. Per MD [facility] patient now wants to be back on warfarin therapy and she would like to restart heparin drip. Patient received last dose of Apixaban @ ~09:00 on 2/13 2/14 APTT >160 at 1248 heparin held for 1 hour, resumed at a 1150 units/hr. 2/15 0530 aPTT > 160s, HL 0.51. Heparin held for one hour and resumed at 950 units/hour. 2/15 1500 aPTT 96 and HL 0.19.    Coumadin: 2/8 INR 1.01  Warfarin 7.5mg  2/9 INR 1.09  Warfarin 7.5mg  2/10 INR 1.11  Warfarin 7.5 mg 2/11 INR 1.23  Warfarin 5mg  2/12 INR 1.51  started on apixaban see note  2/13 INR 1.67  warfarin 10 mg PO (MD Kalisetti's preference)  2/14 INR 1.65  Warfarin 7.5 mg  2/15 INR 1.87  Goal of Therapy:  INR 2-3 Heparin level 0.3-0.7 units/ml Monitor  platelets by anticoagulation protocol: Yes   Plan:  Spoke with cardiologist Dr. Rockey Situ this morning. He would like to keep Heparin at the lower end of therapeutic so will continue with current rate of 950 units/hr. Will recheck aPTT and HL in 6 hours.  Anticipate that INR will be therapeutic in the morning and will be able to stop Heparin infusion. Will give Warfarin 7.5mg  today.  2/15 2155 HL 0.19 and aPTT 92. Continue current rate. Will recheck HL and aPTT with AM labs.  2/16 0545 HL 0.25 and aPTT 137. Decrease to 900 units/hr. Recheck HL and aPTT in 6 hours.  Thank you for this consult.  Chauntae Hults A. [place], [place].D., BCPS Clinical Pharmacist 03/19/2016 12:27 AM

## 2016-03-19 NOTE — Care Management (Signed)
No discharge needs identified by members of care team.  Has Life Vest in place

## 2016-03-19 NOTE — Discharge Summary (Signed)
[place] [facility] [place] NAME: Edward [name]    MR#:  [id number]  DATE OF BIRTH:  17-Apr-1950  DATE OF ADMISSION:  03/09/2016 ADMITTING PHYSICIAN: Gladstone Lighter, MD  DATE OF DISCHARGE: 03/19/16 PRIMARY CARE PHYSICIAN: Vernie Murders, PA    ADMISSION DIAGNOSIS:  ST elevation myocardial infarction (STEMI), unspecified artery (HCC) [I21.3]  DISCHARGE DIAGNOSIS:  Principal Problem:   ST elevation myocardial infarction (STEMI) ([place]) Active Problems:   Ischemic cardiomyopathy   Smoker   Mixed hyperlipidemia   Encounter for anticoagulation discussion and counseling   Clostridium difficile diarrhea   SECONDARY DIAGNOSIS:   Past Medical History:  Diagnosis Date  . Anxiety   . BPH (benign prostatic hyperplasia)   . Hypertension     HOSPITAL COURSE:  History of present illness  Edward [name]  is a 66 y.o. male with a known history of Anxiety, BPH, hypertension presents from home secondary to onset of chest pain and difficulty breathing last night. Patient's complaint started about a month ago when he had severe chest pain, however he did not get any medical attention and spontaneously resolved. Last night he had chest discomfort again, initially thought indigestion. However it kept him all night long couldn't take it anymore and presented to the emergency room. Noted to have ST elevation in anterior leads however Q waves were noted as well. STEMI protocol was followed, patient went to the cardiac Cath Lab and had drug eluting stent placed to Roxanol LAD. He also had chronic total occlusion of diagonal 1 and nonobstructive CAD of RCA and left circumflex. He still has minimal chest discomfort. Being admitted to ICU post PCI. BP is slightly elevated.  Hospital course  #1 STEMI- appreciate [facility] cardiology consult, s/p cardiac catheterization and LAD drug eluting stent placed - on asa 81 mg  plavix, and warfarin. INR is at 2.31 today. INR is  therapeutic will discharge patient home with the 5 mg of Coumadin  on coreg, loasrtan, Aldactone and statin, tolerating well - ECHO with EF at 30% - prn nitro -Outpatient follow-up with the cardiology Dr. end on Monday, 03/22/2016 at 9:45 AM, patient will also get repeat INR check at the time -Outpatient cardiac rehabilitation  #2 LV thrombus- noted on ECHO -restarted on heparin drip with coumadin, monitor while on ABX - INR at 2.3 today. Planning to discharge as INR is therapeutic, discharged home with Coumadin 5 mg once daily until seen by cardiology on Monday - patient refused  to be on eliquis  #3 Ischemic cardiomyopathy- secondary to STEMI, EF noted to be 30% On life vest. Repeat ECHO after discharge at cardiology office - on coreg and losartan added  #4 Infectious diarrhea- diarrhea, stool with c.diff and E.coli - on flagyl and cipro - d/c cipro after 5 days and continue flagyl for 7 days after cipro  #5 DVT prophylaxis- coumadin and heparin drip for bridging    DISCHARGE CONDITIONS:   STABLE  CONSULTS OBTAINED:  Treatment Team:  Nelva Bush, MD Minna Merritts, MD   PROCEDURES  s/p cardiac catheterization and LAD drug eluting stent placed  DRUG ALLERGIES:  No Known Allergies  DISCHARGE MEDICATIONS:   Current Discharge Medication List    START taking these medications   Details  aspirin 81 MG chewable tablet Chew 1 tablet (81 mg total) by mouth daily.    atorvastatin (LIPITOR) 80 MG tablet Take 1 tablet (80 mg total) by mouth daily at 6 PM. Qty: 30 tablet, Refills:  0    carvedilol (COREG) 3.125 MG tablet Take 1 tablet (3.125 mg total) by mouth 2 (two) times daily with a meal. Qty: 60 tablet, Refills: 0    ciprofloxacin (CIPRO) 500 MG tablet Take 1 tablet (500 mg total) by mouth 2 (two) times daily. Qty: 10 tablet, Refills: 0    clopidogrel (PLAVIX) 75 MG tablet Take 1 tablet (75 mg total) by mouth daily. Qty: 30 tablet, Refills: 0    losartan  (COZAAR) 25 MG tablet Take 0.5 tablets (12.5 mg total) by mouth daily. Qty: 60 tablet, Refills: 0    metroNIDAZOLE (FLAGYL) 500 MG tablet Take 1 tablet (500 mg total) by mouth every 8 (eight) hours. Qty: 36 tablet, Refills: 0    spironolactone (ALDACTONE) 25 MG tablet Take 0.5 tablets (12.5 mg total) by mouth daily. Qty: 30 tablet, Refills: 0    warfarin (COUMADIN) 5 MG tablet Take 1 tablet (5 mg total) by mouth daily at 6 PM. Qty: 3 tablet, Refills: 0      CONTINUE these medications which have NOT CHANGED   Details  metoprolol (LOPRESSOR) 50 MG tablet TAKE 1 TABLET BY MOUTH TWICE A DAY Qty: 60 tablet, Refills: 6    famotidine (PEPCID) 20 MG tablet Take 1 tablet (20 mg total) by mouth 2 (two) times daily. Qty: 30 tablet, Refills: 3   Associated Diagnoses: Gastroesophageal reflux disease, esophagitis presence not specified    LORazepam (ATIVAN) 0.5 MG tablet TAKE 1 TABLET BY MOUTH 3 TIMES A DAY AS NEEDED FOR ANXIETY Qty: 60 tablet, Refills: 1    tamsulosin (FLOMAX) 0.4 MG CAPS capsule Take 1 capsule (0.4 mg total) by mouth daily. Qty: 30 capsule, Refills: 3   Associated Diagnoses: Urinary hesitancy due to benign prostatic hypertrophy      STOP taking these medications     Aspirin Buf,AlHyd-MgHyd-CaCar, (ASCRIPTIN) 325 MG TABS          DISCHARGE INSTRUCTIONS:   Outpatient follow-up with cardiac rehabilitation Follow up with cardiology Dr. Saunders Revel on Monday 19th at 9:45 AM and get repeat PT/INR checked at the same time, Coumadin dosing by cardiology from Monday Follow-up with primary care physician in a week  DIET:  Cardiac diet  DISCHARGE CONDITION:  Fair  ACTIVITY:  Activity as tolerated  OXYGEN:  Home Oxygen: No.   Oxygen Delivery: room air  DISCHARGE LOCATION:  home   If you experience worsening of your admission symptoms, develop shortness of breath, life threatening emergency, suicidal or homicidal thoughts you must seek medical attention immediately by  calling 911 or calling your MD immediately  if symptoms less severe.  You Must read complete instructions/literature along with all the possible adverse reactions/side effects for all the Medicines you take and that have been prescribed to you. Take any new Medicines after you have completely understood and accpet all the possible adverse reactions/side effects.   Please note  You were cared for by a hospitalist during your hospital stay. If you have any questions about your discharge medications or the care you received while you were in the hospital after you are discharged, you can call the unit and asked to speak with the hospitalist on call if the hospitalist that took care of you is not available. Once you are discharged, your primary care physician will handle any further medical issues. Please note that NO REFILLS for any discharge medications will be authorized once you are discharged, as it is imperative that you return to your primary care physician (or establish  a relationship with a primary care physician if you do not have one) for your aftercare needs so that they can reassess your need for medications and monitor your lab values.     Today  No chief complaint on file.  Patient is resting comfortably diarrhea improved. Denies any chest pain or shortness of breath. Wants to go home  ROS:  CONSTITUTIONAL: Denies fevers, chills. Denies any fatigue, weakness.  EYES: Denies blurry vision, double vision, eye pain. EARS, NOSE, THROAT: Denies tinnitus, ear pain, hearing loss. RESPIRATORY: Denies cough, wheeze, shortness of breath.  CARDIOVASCULAR: Denies chest pain, palpitations, edema.  GASTROINTESTINAL: Denies nausea, vomiting, diarrhea, abdominal pain. Denies bright red blood per rectum. GENITOURINARY: Denies dysuria, hematuria. ENDOCRINE: Denies nocturia or thyroid problems. HEMATOLOGIC AND LYMPHATIC: Denies easy bruising or bleeding. SKIN: Denies rash or  lesion. MUSCULOSKELETAL: Denies pain in neck, back, shoulder, knees, hips or arthritic symptoms.  NEUROLOGIC: Denies paralysis, paresthesias.  PSYCHIATRIC: Denies anxiety or depressive symptoms.   VITAL SIGNS:  Blood pressure 104/61, pulse (!) 58, temperature 98 F (36.7 C), temperature source Oral, resp. rate 20, height 5\' 11"  (1.803 m), weight 98.1 kg (216 lb 4.3 oz), SpO2 97 %.  I/O:    Intake/Output Summary (Last 24 hours) at 03/19/16 1559 Last data filed at 03/19/16 1010  Gross per 24 hour  Intake           761.17 ml  Output             2650 ml  Net         -1888.83 ml    PHYSICAL EXAMINATION:  GENERAL:  66 y.o.-year-old patient lying in the bed with no acute distress.  EYES: Pupils equal, round, reactive to light and accommodation. No scleral icterus. Extraocular muscles intact.  HEENT: Head atraumatic, normocephalic. Oropharynx and nasopharynx clear.  NECK:  Supple, no jugular venous distention. No thyroid enlargement, no tenderness.  LUNGS: Normal breath sounds bilaterally, no wheezing, rales,rhonchi or crepitation. No use of accessory muscles of respiration.  CARDIOVASCULAR: S1, S2 normal. No murmurs, rubs, or gallops.  ABDOMEN: Soft, non-tender, non-distended. Bowel sounds present. No organomegaly or mass.  EXTREMITIES: No pedal edema, cyanosis, or clubbing.  NEUROLOGIC: Cranial nerves II through XII are intact. Muscle strength 5/5 in all extremities. Sensation intact. Gait not checked.  PSYCHIATRIC: The patient is alert and oriented x 3.  SKIN: No obvious rash, lesion, or ulcer.   DATA REVIEW:   CBC  Recent Labs Lab 03/19/16 0545  WBC 7.1  HGB 14.4  HCT 41.4  PLT 212    Chemistries   Recent Labs Lab 03/16/16 0446  NA 136  K 4.2  CL 106  CO2 23  GLUCOSE 110*  BUN 14  CREATININE 0.81  CALCIUM 9.0    Cardiac Enzymes No results for input(s): TROPONINI in the last 168 hours.  Microbiology Results  Results for orders placed or performed during  the hospital encounter of 03/09/16  MRSA PCR Screening     Status: None   Collection Time: 03/09/16  6:33 PM  Result Value Ref Range Status   MRSA by PCR NEGATIVE NEGATIVE Final    Comment:        The GeneXpert MRSA Assay (FDA approved for NASAL specimens only), is one component of a comprehensive MRSA colonization surveillance program. It is not intended to diagnose MRSA infection nor to guide or monitor treatment for MRSA infections.   Gastrointestinal Panel by PCR , Stool     Status: Abnormal  Collection Time: 03/14/16  9:35 AM  Result Value Ref Range Status   Campylobacter species NOT DETECTED NOT DETECTED Final   Plesimonas shigelloides NOT DETECTED NOT DETECTED Final   Salmonella species NOT DETECTED NOT DETECTED Final   Yersinia enterocolitica NOT DETECTED NOT DETECTED Final   Vibrio species NOT DETECTED NOT DETECTED Final   Vibrio cholerae NOT DETECTED NOT DETECTED Final   Enteroaggregative E coli (EAEC) NOT DETECTED NOT DETECTED Final   Enteropathogenic E coli (EPEC) DETECTED (A) NOT DETECTED Final    Comment: RESULT CALLED TO, READ BACK BY AND VERIFIED WITH: LANCE LANSANA 03/14/16 1222 SGD    Enterotoxigenic E coli (ETEC) NOT DETECTED NOT DETECTED Final   Shiga like toxin producing E coli (STEC) NOT DETECTED NOT DETECTED Final   Shigella/Enteroinvasive E coli (EIEC) NOT DETECTED NOT DETECTED Final   Cryptosporidium NOT DETECTED NOT DETECTED Final   Cyclospora cayetanensis NOT DETECTED NOT DETECTED Final   Entamoeba histolytica NOT DETECTED NOT DETECTED Final   Giardia lamblia NOT DETECTED NOT DETECTED Final   Adenovirus F40/41 NOT DETECTED NOT DETECTED Final   Astrovirus NOT DETECTED NOT DETECTED Final   Norovirus GI/GII NOT DETECTED NOT DETECTED Final   Rotavirus A NOT DETECTED NOT DETECTED Final   Sapovirus (I, II, IV, and V) NOT DETECTED NOT DETECTED Final  C difficile quick scan w PCR reflex     Status: Abnormal   Collection Time: 03/14/16  9:35 AM  Result  Value Ref Range Status   C Diff antigen POSITIVE (A) NEGATIVE Final   C Diff toxin NEGATIVE NEGATIVE Final   C Diff interpretation Results are indeterminate. See PCR results.  Final  Clostridium Difficile by PCR     Status: Abnormal   Collection Time: 03/14/16  9:35 AM  Result Value Ref Range Status   Toxigenic C Difficile by pcr POSITIVE (A) NEGATIVE Final    Comment: Positive for toxigenic C. difficile with little to no toxin production. Only treat if clinical presentation suggests symptomatic illness.    RADIOLOGY:  No results found.  EKG:   Orders placed or performed during the hospital encounter of 03/09/16  . EKG 12-Lead  . EKG 12-Lead  . ED EKG  . ED EKG  . EKG 12-Lead immediately post procedure  . EKG 12-Lead  . EKG 12-Lead immediately post procedure  . EKG 12-Lead  . EKG 12-Lead  . EKG 12-Lead      Management plans discussed with the patient, family and they are in agreement.  CODE STATUS:     Code Status Orders        Start     Ordered   03/09/16 1643  Full code  Continuous     03/09/16 1642    Code Status History    Date Active Date Inactive Code Status Order ID Comments User Context   03/09/2016  4:42 PM 03/09/2016  4:42 PM Full Code [id number]  Nelva Bush, MD Inpatient      TOTAL TIME TAKING CARE OF THIS PATIENT: 43  minutes.   Note: This dictation was prepared with Dragon dictation along with smaller phrase technology. Any transcriptional errors that result from this process are unintentional.   @[facility] @  on 03/19/2016 at 3:59 PM  Between 7am to 6pm - Pager - [phone]  After 6pm go to [email or web address] - password [email or web address] [facility]  [facility]  Office  [phone]  CC: Primary care physician; Vernie Murders, PA

## 2016-03-19 NOTE — Telephone Encounter (Signed)
Pt is being discharged from [facility] today and is scheduled for Hospital F/U on 03/26/16. Thanks TNP

## 2016-03-19 NOTE — Discharge Instructions (Signed)
Outpatient follow-up with cardiac rehabilitation Follow up with cardiology Dr. Saunders Revel on Monday 19th at 9:45 AM and get repeat PT/INR checked at the same time, Coumadin dosing by cardiology from Monday Follow-up with primary care physician in a week

## 2016-03-19 NOTE — Telephone Encounter (Signed)
Patient contacted regarding discharge from [facility] on Still admitted.  Patient understands to follow up with provider Dr. Saunders Revel on 03/24/16 at 11:15AM at [facility]. Patient understands discharge instructions? Still Admitted Patient understands medications and regiment? Still Admitted Patient understands to bring all medications to this visit? Yes  Patient is still admitted in the hospital and had no questions at this time. He states that they have not reviewed discharge information at this time. Let him know to call back if he had any questions.

## 2016-03-22 ENCOUNTER — Ambulatory Visit (INDEPENDENT_AMBULATORY_CARE_PROVIDER_SITE_OTHER): Payer: Commercial Managed Care - PPO

## 2016-03-22 ENCOUNTER — Telehealth: Payer: Self-pay | Admitting: Internal Medicine

## 2016-03-22 ENCOUNTER — Other Ambulatory Visit: Payer: Self-pay

## 2016-03-22 DIAGNOSIS — I236 Thrombosis of atrium, auricular appendage, and ventricle as current complications following acute myocardial infarction: Secondary | ICD-10-CM | POA: Insufficient documentation

## 2016-03-22 DIAGNOSIS — Z5181 Encounter for therapeutic drug level monitoring: Secondary | ICD-10-CM | POA: Diagnosis not present

## 2016-03-22 DIAGNOSIS — I213 ST elevation (STEMI) myocardial infarction of unspecified site: Secondary | ICD-10-CM

## 2016-03-22 LAB — POCT INR: INR: 2.9

## 2016-03-22 MED ORDER — WARFARIN SODIUM 5 MG PO TABS: 5.0000 mg | ORAL_TABLET | Freq: Every day | ORAL | 0 refills | Status: DC

## 2016-03-22 NOTE — Telephone Encounter (Signed)
Pt needs warfarin refills as he was only given 3 tablets at d/c. Per Danae Chen, sent in 5mg  #30, 0 refills. Submitted to [facility], [employer]

## 2016-03-23 ENCOUNTER — Other Ambulatory Visit: Payer: Self-pay | Admitting: *Deleted

## 2016-03-23 NOTE — Telephone Encounter (Signed)
Transition Care Management Follow-up Telephone Call    Date discharged? 03/19/16  How have you been since you were released from the hospital? Recovering but gets winded easily. Still experiencing some heartburn.  Any patient concerns? Fear of having another episode.    Items Reviewed:  Medications reviewed: Yes  Allergies reviewed: Yes  Dietary changes reviewed: Yes- cardiac  Referrals reviewed: Yes- Dr. Rockey Situ (cardiology) yesterday, 03/22/16 and Dr Saunders Revel 03/24/16.   Functional Questionnaire:  Independent - I Dependent - D    Activities of Daily Living (ADLs):    Personal hygiene - I Dressing - I Eating - I Maintaining continence - I Transferring - I   Independent Activities of Daily Living (iADLs): Basic communication skills - I Transportation - I Meal preparation  - I Shopping - I Housework - I Managing medications - I  Managing personal finances - I   Confirmed importance and date/time of follow-up visits scheduled YES  Provider Appointment booked with PCP 03/26/16 @ 10:30.  Confirmed with patient if condition begins to worsen call PCP or go to the ER.  Patient was given the office number and encouraged to call back with question or concerns: YES

## 2016-03-23 NOTE — Telephone Encounter (Signed)
Your welcome. -MM

## 2016-03-23 NOTE — Telephone Encounter (Signed)
Very good!  Thanks!

## 2016-03-24 ENCOUNTER — Encounter: Payer: Self-pay | Admitting: Internal Medicine

## 2016-03-24 ENCOUNTER — Ambulatory Visit (INDEPENDENT_AMBULATORY_CARE_PROVIDER_SITE_OTHER): Payer: Commercial Managed Care - PPO

## 2016-03-24 ENCOUNTER — Ambulatory Visit (INDEPENDENT_AMBULATORY_CARE_PROVIDER_SITE_OTHER): Payer: Commercial Managed Care - PPO | Admitting: Internal Medicine

## 2016-03-24 ENCOUNTER — Other Ambulatory Visit
Admission: RE | Admit: 2016-03-24 | Discharge: 2016-03-24 | Disposition: A | Discharge status: Home / Self Care | Payer: Commercial Managed Care - PPO | Source: Ambulatory Visit | Attending: Internal Medicine | Admitting: Internal Medicine

## 2016-03-24 VITALS — BP 104/70 | HR 72 | Ht 70.0 in | Wt 205.0 lb

## 2016-03-24 DIAGNOSIS — Z5181 Encounter for therapeutic drug level monitoring: Secondary | ICD-10-CM | POA: Diagnosis not present

## 2016-03-24 DIAGNOSIS — I10 Essential (primary) hypertension: Secondary | ICD-10-CM | POA: Insufficient documentation

## 2016-03-24 DIAGNOSIS — I236 Thrombosis of atrium, auricular appendage, and ventricle as current complications following acute myocardial infarction: Secondary | ICD-10-CM

## 2016-03-24 DIAGNOSIS — I213 ST elevation (STEMI) myocardial infarction of unspecified site: Secondary | ICD-10-CM | POA: Diagnosis not present

## 2016-03-24 DIAGNOSIS — I513 Intracardiac thrombosis, not elsewhere classified: Secondary | ICD-10-CM | POA: Diagnosis not present

## 2016-03-24 DIAGNOSIS — Z79899 Other long term (current) drug therapy: Secondary | ICD-10-CM | POA: Insufficient documentation

## 2016-03-24 DIAGNOSIS — E785 Hyperlipidemia, unspecified: Secondary | ICD-10-CM | POA: Diagnosis not present

## 2016-03-24 DIAGNOSIS — I255 Ischemic cardiomyopathy: Secondary | ICD-10-CM | POA: Diagnosis present

## 2016-03-24 DIAGNOSIS — I2510 Atherosclerotic heart disease of native coronary artery without angina pectoris: Secondary | ICD-10-CM | POA: Diagnosis not present

## 2016-03-24 LAB — POCT INR: INR: 2.8

## 2016-03-24 LAB — BASIC METABOLIC PANEL
Anion gap: 8 (ref 5–15)
BUN: 15 mg/dL (ref 6–20)
CALCIUM: 9.2 mg/dL (ref 8.9–10.3)
CHLORIDE: 105 mmol/L (ref 101–111)
CO2: 23 mmol/L (ref 22–32)
CREATININE: 0.9 mg/dL (ref 0.61–1.24)
GFR calc non Af Amer: >60 mL/min (ref >60)
Glucose, Bld: 103 mg/dL — ABNORMAL HIGH (ref 65–99)
Potassium: 4.5 mmol/L (ref 3.5–5.1)
SODIUM: 136 mmol/L (ref 135–145)

## 2016-03-24 NOTE — Patient Instructions (Signed)

## 2016-03-24 NOTE — Progress Notes (Signed)
Follow-up Outpatient Visit Date: 03/24/2016  Primary Care Provider: Vernie Murders, [place] [address]  Chief Complaint: Follow-up anterior STEMI and ischemic cardiomyopathy  HPI:  [name] is a 66 y.o. year-old male with history of coronary artery disease status post recent anterior STEMI (late presentation) complicated by ischemic cardiomyopathy with severe LV dysfunction and likely apical thrombus, who presents for follow-up of CAD and cardiomyopathy. He presented to his PCP on 01/06/17 with 12-18 hours of chest pain. Due to abnormal EKG, he was referred to the ER. At the [facility] ED, he was found to have anterior ST elevation with Q waves. Given ongoing chest pain, he was taken for emergent left heart catheterization demonstrating occlusion of the proximal LAD. PCI was challenging but ultimately successful with placement of a resolute 3.0 x 38 mm drug-eluting stent. D2 was jailed by the stent with a 70% ostial stenosis but TIMI 3 flow. Echocardiogram revealed severe LV dysfunction with an EF of 30-35% and significant anterior and apical wall motion abnormality. There was suspicion for apical thrombus, prompting initiation of heparin and warfarin. The patient's hospitalization was complicated by C. difficile colitis. He was discharged home on 03/19/16 after achieving a therapeutic INR. Due to severe LV dysfunction and anterior MI, he was also sent home with a LifeVest.  The patient has been feeling relatively well since leaving the hospital, though he has some good days and some bad ones. 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 Today, he has noted some intermittent lightheadedness. He reports occasional "bubbling" feeling in his chest, which lasts 1-2 minutes and resolves with belching. He does not have exertional pain. He denies shortness of breath and edema, as well as orthopnea and PND. He is wearing  the LifeVest without any issues. He continues on warfarin and clopidogrel without significant bleeding. He has seen scant blood when he blows his nose at times, though this predates his MI and anticoagulation.  --------------------------------------------------------------------------------------------------  Cardiovascular History & Procedures: Cardiovascular Problems:  CAD s/p anterior STEMI with PCI to proximal LAD (03/2016)  Ischemic cardiomyopathy with severe LV dysfunction  LV apical thrombus  Risk Factors:  Known CAD, HTN, hyperlipidemia, male gender, and age > 11  Cath/PCI:  LHC/PCI (03/09/16): Late presenting STEMI with thrombotic occlusion o fhte proximal/mid LAD involving D1 and D2. Mild to moderate, non-obstructive CAD involving LCx and RCA. Successful IVUS-guided PCI to proximal and mid LAD using Integrity REsolute 3.0 x 38 mm DES (post-dilated up to 4.0 mm) with 0% residual stenosis and TIMI-3 flow. D2 was jailed by the LAD stent resulting in 70% ostial stenosis with TIMI-3 flow.  CV Surgery:  None  EP Procedures and Devices:  None  Non-Invasive Evaluation(s):  TTE (03/10/16): Moderate to severe LV dysfunction with LAD territory akinesis (LVEF 30-35%). Likely mural thrombus at LV apex. Limited evaluation of cardiac valve due to lack of parasternal windows; no significant valvular abnormality identified. Normal RV size and function.  Recent CV Pertinent Labs: Lab Results  Component Value Date   CHOL 187 03/09/2016   CHOL 179 12/16/2015   HDL 47 03/09/2016   HDL 41 12/16/2015   LDLCALC 124 (H) 03/09/2016   LDLCALC 120 (H) 12/16/2015   TRIG 79 03/09/2016   CHOLHDL 4.0 03/09/2016   INR 2.9 03/22/2016   INR 2.31 03/19/2016   K 4.2 03/16/2016   MG 1.9 03/10/2016   BUN 14 03/16/2016   BUN 20 12/16/2015  CREATININE 0.81 03/16/2016    Past medical and surgical history were reviewed and updated in EPIC.  Outpatient Encounter Prescriptions as of 03/24/2016    Medication Sig  . aspirin 81 MG chewable tablet Chew 1 tablet (81 mg total) by mouth daily.  Marland Kitchen atorvastatin (LIPITOR) 80 MG tablet Take 1 tablet (80 mg total) by mouth daily at 6 PM. (Patient taking differently: Take 80 mg by mouth daily at 6 PM. )  . carvedilol (COREG) 3.125 MG tablet Take 1 tablet (3.125 mg total) by mouth 2 (two) times daily with a meal.  . ciprofloxacin (CIPRO) 500 MG tablet Take 1 tablet (500 mg total) by mouth 2 (two) times daily.  . clopidogrel (PLAVIX) 75 MG tablet Take 1 tablet (75 mg total) by mouth daily.  . famotidine (PEPCID) 20 MG tablet Take 1 tablet (20 mg total) by mouth 2 (two) times daily. (Patient taking differently: Take 20 mg by mouth 2 (two) times daily. )  . LORazepam (ATIVAN) 0.5 MG tablet TAKE 1 TABLET BY MOUTH 3 TIMES A DAY AS NEEDED FOR ANXIETY  . losartan (COZAAR) 25 MG tablet Take 0.5 tablets (12.5 mg total) by mouth daily.  . metroNIDAZOLE (FLAGYL) 500 MG tablet Take 1 tablet (500 mg total) by mouth every 8 (eight) hours.  Marland Kitchen spironolactone (ALDACTONE) 25 MG tablet Take 0.5 tablets (12.5 mg total) by mouth daily.  . tamsulosin (FLOMAX) 0.4 MG CAPS capsule Take 1 capsule (0.4 mg total) by mouth daily.  Marland Kitchen warfarin (COUMADIN) 5 MG tablet Take 1 tablet (5 mg total) by mouth daily at 6 PM.  . [DISCONTINUED] metoprolol (LOPRESSOR) 50 MG tablet TAKE 1 TABLET BY MOUTH TWICE A DAY (Patient not taking: Reported on 03/24/2016)   No facility-administered encounter medications on file as of 03/24/2016.     Allergies: Patient has no known allergies.  Social History   Social History  . Marital status: Married    Spouse name: N/A  . Number of children: N/A  . Years of education: N/A   Occupational History  . Not on file.   Social History Main Topics  . Smoking status: Former Smoker    Start date: 07/03/2007  . Smokeless tobacco: Never Used     Comment: QUIT IN 2005  . Alcohol use 0.0 oz/week     Comment: 3-4 beers every other day  . Drug use: Yes     Types: Marijuana     Comment: smokes pot  . Sexual activity: Not on file   Other Topics Concern  . Not on file   Social History Narrative   Living with family, Independent at baseline    Family History  Problem Relation Age of Onset  . Emphysema Mother   . Macular degeneration Mother   . Heart attack Mother   . Heart attack Father     Review of Systems: A 12-system review of systems was performed and was negative except as noted in the HPI.  --------------------------------------------------------------------------------------------------  Physical Exam: BP 104/70 (BP Location: Left Arm, Patient Position: Sitting, Cuff Size: Normal)   Pulse 72   Ht 5\' 10"  (1.778 m)   Wt 205 lb (93 kg)   BMI 29.41 kg/m   General:  Overweight man, seated comfortably in the exam room. HEENT: No conjunctival pallor or scleral icterus.  Moist mucous membranes.  OP clear. Neck: Supple without lymphadenopathy, thyromegaly, JVD, or HJR.  No carotid bruit. Lungs: Normal work of breathing.  Clear to auscultation bilaterally without wheezes or crackles. Heart:  Regular rate and rhythm without murmurs, rubs, or gallops.  Non-displaced PMI. Abd: Bowel sounds present.  Soft, NT/ND without hepatosplenomegaly Ext: No lower extremity edema.  Radial, PT, and DP pulses are 2+ bilaterally. Skin: warm and dry without rash  EKG:  Normal sinus rhythm with left axis deviation, poor R-wave progression and anterolateral T-wave inversions. Inferior Q-waves also present. Anterolateral ST elevation is less pronounced today compared with prior tracing on 03/10/16 (I have personally reviewed both tracings).  Lab Results  Component Value Date   WBC 7.1 03/19/2016   HGB 14.4 03/19/2016   HCT 41.4 03/19/2016   MCV 94.3 03/19/2016   PLT 212 03/19/2016    Lab Results  Component Value Date   NA 136 03/16/2016   K 4.2 03/16/2016   CL 106 03/16/2016   CO2 23 03/16/2016   BUN 14 03/16/2016   CREATININE 0.81  03/16/2016   GLUCOSE 110 (H) 03/16/2016   ALT 52 03/09/2016    Lab Results  Component Value Date   CHOL 187 03/09/2016   HDL 47 03/09/2016   LDLCALC 124 (H) 03/09/2016   TRIG 79 03/09/2016   CHOLHDL 4.0 03/09/2016    --------------------------------------------------------------------------------------------------  ASSESSMENT AND PLAN: Coronary artery disease status post recent STEMI Patient reports a few episodes of atypical chest discomfort that are brief and relieved by belching. I do not believe that this represents worsening coronary insufficiency. We will continue his current medications, including ASA, warfain, and clopidogrel. We will consider stopping ASA one month after PCI.  Ischemic cardiomyopathy Patient appears euvolemic with NYHA class III symptoms. Due to his lightheadedness and borderline low blood pressure, we will not escalate his current doses of carvedilol, losartan, and spironolactone. We will check a BMP to reassess his renal function and potassium. Patient to continue wearing lifevest for primary prevention. We will repeat limited TTE 3 months after PCI. I encouraged the patient to limit his activities so as not to overexert himself, given his recent MI and cardiomyopathy.  LV apical thrombus No embolic symptoms. We will continue with warfarin (target INR 2-3) and repeat TTE 3 months after PCI.  Hypertension Blood pressure low normal today. We will continue his current regimen.  Hyperlipidemia Patient tolerating high-intensity statin therapy well. We will repeat lipid panel and ALT in 4-6 weeks.  Follow-up: Return to clinic in ~2 weeks.  Nelva Bush, MD 03/25/2016 6:49 PM

## 2016-03-24 NOTE — Patient Instructions (Signed)
Medication Instructions:  Your physician recommends that you continue on your current medications as directed. Please refer to the Current Medication list given to you today.   Labwork: Your physician recommends that you return for lab work in: TODAY (BMP) - Please go to the [facility]. You will check in at the front desk to the right as you walk into the atrium. Valet Parking is offered if needed.    Testing/Procedures: none  Follow-Up: Your physician recommends that you schedule a follow-up appointment in: 2 WEEKS WITH DR END.    If you need a refill on your cardiac medications before your next appointment, please call your pharmacy.

## 2016-03-25 ENCOUNTER — Encounter: Payer: Self-pay | Admitting: Internal Medicine

## 2016-03-25 DIAGNOSIS — I25119 Atherosclerotic heart disease of native coronary artery with unspecified angina pectoris: Secondary | ICD-10-CM | POA: Insufficient documentation

## 2016-03-26 ENCOUNTER — Ambulatory Visit (INDEPENDENT_AMBULATORY_CARE_PROVIDER_SITE_OTHER): Payer: Commercial Managed Care - PPO | Admitting: Family Medicine

## 2016-03-26 ENCOUNTER — Encounter: Payer: Self-pay | Admitting: Family Medicine

## 2016-03-26 VITALS — BP 108/68 | HR 60 | Temp 97.9°F | Resp 16 | Wt 212.2 lb

## 2016-03-26 DIAGNOSIS — A0472 Enterocolitis due to Clostridium difficile, not specified as recurrent: Secondary | ICD-10-CM | POA: Diagnosis not present

## 2016-03-26 DIAGNOSIS — I2510 Atherosclerotic heart disease of native coronary artery without angina pectoris: Secondary | ICD-10-CM

## 2016-03-26 DIAGNOSIS — I255 Ischemic cardiomyopathy: Secondary | ICD-10-CM | POA: Diagnosis not present

## 2016-03-26 DIAGNOSIS — I10 Essential (primary) hypertension: Secondary | ICD-10-CM

## 2016-03-26 NOTE — Patient Instructions (Signed)
Heart Attack °A heart attack (myocardial infarction, MI) causes damage to your heart that cannot be fixed. A heart attack can happen when a heart (coronary) artery becomes blocked or narrowed. This cuts off the blood supply and oxygen to your heart. °When one or more of your coronary arteries becomes blocked, that area of your heart begins to die. This causes the pain you feel during a heart attack. Heart attack pain can also occur in one part of the body but be felt in another part of the body (referred pain). You may feel referred heart attack pain in your left arm, neck, or jaw. Pain may even be felt in the right arm. °What are the causes? °Many conditions can cause a heart attack. These include: °· Atherosclerosis. This is when a fatty substance (plaque) gradually builds up in the arteries. This buildup can block or reduce the blood supply to one or more of the heart arteries. °· A blood clot. A blood clot can develop suddenly when plaque breaks up (ruptures) within a heart artery. A blood clot can block the heart artery, which prevents blood flow to the heart. °· Severe tightening (spasm) of the heart artery. This cuts off blood flow through the artery. °What increases the risk? °  °People at risk for heart attack usually have one or more of the following risk factors: °· High blood pressure (hypertension). °· High cholesterol. °· Smoking. °· Being male. °· Being overweight or obese. °· Older aged. °· A family history of heart disease. °· Lack of exercise. °· Diabetes. °· Stress. °· Drinking too much alcohol. °· Using illegal street drugs, such as cocaine and methamphetamines. °What are the signs or symptoms? °Heart attack symptoms can vary from person to person. Symptoms depend on factors like gender and age. °· In both men and women, heart attack symptoms can include the following: °¨ Chest pain. This may feel like crushing, squeezing, or a feeling of pressure. °¨ Shortness of breath. °¨ Heartburn or  indigestion with or without vomiting, shortness of breath, or sweating. °¨ Sudden cold sweats. °¨ Sudden light-headedness. °¨ Upper back pain. °· Women can have unique heart attack symptoms, such as: °¨ Unexplained feelings of nervousness or anxiety. °¨ Discomfort between the shoulder blades or upper back. °¨ Tingling in the hands and arms. °· Older people (of both genders) can have subtle heart attack symptoms, such as: °¨ Sweating. °¨ Shortness of breath. °¨ General tiredness or not feeling well. °How is this diagnosed? °Diagnosing a heart attack involves several tests. They include: °· An assessment of your vital signs. This includes checking your: °¨ Heart rhythm. °¨ Blood pressure. °¨ Breathing rate. °¨ Oxygen level. °· An ECG (electrocardiogram) to measure the electrical activity of your heart. °· Blood tests called cardiac markers. In these tests, blood is drawn at scheduled times to check for the specific proteins or enzymes released by damaged heart muscle. °· A chest X-ray. °· An echocardiogram to evaluate heart motion and blood flow. °· Coronary angiography to look at the heart arteries. °How is this treated? °Treatment for a heart attack may include: °· Medicine that breaks up or dissolves blood clots in the heart artery. °· Angioplasty. °· Cardiac stent placement. °· Intra-aortic balloon pump therapy (IABP). °· Open heart surgery (coronary artery bypass graft, CABG). °Follow these instructions at home: °· Take medicines only as directed by your health care provider. You may need to take medicine after a heart attack to: °¨ Keep your blood from clotting   too easily.  Control your blood pressure.  Lower your cholesterol.  Control abnormal heart rhythms.  Do not take the following medicines unless your health care provider approves:  Nonsteroidal anti-inflammatory drugs (NSAIDs), such as ibuprofen, naproxen, or celecoxib.  Vitamin supplements that contain vitamin A, vitamin E, or  both.  Hormone replacement therapy that contains estrogen with or without progestin.  Make lifestyle changes as directed by your health care provider. These may include:  Quitting smoking, if you smoke.  Getting regular exercise. Ask your health care provider to suggest some activities that are safe for you.  Eating a heart-healthy diet. A registered dietitian can help you learn healthy eating options.  Maintaining a healthy weight.  Managing diabetes, if necessary.  Reducing stress.  Limiting how much alcohol you drink. Get help right away if:  You have sudden, unexplained chest discomfort.  You have sudden, unexplained discomfort in your arms, back, neck, or jaw.  You have shortness of breath at any time.  You suddenly start to sweat or your skin gets clammy.  You feel nauseous or vomit.  You suddenly feel light-headed or dizzy.  Your heart begins to beat fast or feels like it is skipping beats. These symptoms may represent a serious problem that is an emergency. Do not wait to see if the symptoms will go away. Get medical help right away. Call your local emergency services (911 in the [place] Do not drive yourself to the hospital.  This information is not intended to replace advice given to you by your health care provider. Make sure you discuss any questions you have with your health care provider. Document Released: 01/18/2005 Document Revised: 06/26/2015 Document Reviewed: 03/23/2013 Elsevier Interactive Patient Education  2017 [employer] Clostridium Difficile Infection Introduction  Clostridium difficile (C. difficile or C. diff) infection is a condition that causes inflammation of the large intestine (colon). This condition can result in damage to the lining of your colon and may lead to colitis. This infection can be passed from person to person (is contagious). What are the causes? C. diff is a bacterium that is normally found in the colon. This infection is  caused when the balance of C. diff is changed and there is an overgrowth of C. diff. This is often caused by antibiotic use. What increases the risk? This condition is more likely to develop in people who:  Take antibiotic medicines.  Take a certain type of medicine called proton pump inhibitors over a long period of time (chronic use).  Are older.  Have had a C. diff infection before.  Have serious underlying conditions, such as colon cancer.  Are in the hospital.  Have a weak defense (immune) system.  Live in a place where there is a lot of contact with others, such as a nursing home.  Have had gastrointestinal (GI) tract surgery. What are the signs or symptoms? Symptoms of this condition include:  Diarrhea. This may be bloody, watery, or yellow or green in color.  Fever.  Fatigue.  Loss of appetite.  Nausea.  Swelling, pain, or tenderness in the abdomen.  Dehydration. Dehydration can cause you to be tired and thirsty, have a dry mouth, and urinate less frequently. How is this diagnosed? This condition is diagnosed with a medical history and physical exam. You may also have tests, including:  A test that checks for C. diff in your stool.  Blood tests.  A sigmoidoscopy or colonoscopy to look at your colon. These procedures involve passing an  instrument through your rectum to look at the inside of your colon. How is this treated? Treatment for this condition includes:  Antibiotics that keep C. diff from growing.  Stopping the antibiotics you were on before the C. diff infection began. Only do this as told by your health care provider.  Fluids through an IV tube, if you are dehydrated.  Surgery to remove the infected part of the colon. This is rare. Follow these instructions at home: Eating and drinking  Drink enough fluid to keep your urine clear or pale yellow. Avoid milk, caffeine, and alcohol.  Follow specific rehydration instructions as told by your  health care provider.  Eat small, frequent meals instead of large meals. Medicines  Take your antibiotic medicine as told by your health care provider. Do not stop taking the antibiotic even if you start to feel better unless your health care provider told you to do that.  Take over-the-counter and prescription medicines only as told by your health care provider.  Do not use medicines to help with diarrhea. General instructions  Wash your hands thoroughly before you prepare food and after you use the bathroom. Make sure people who live with you also wash their hands often.  Clean surfaces that you touch with a product that contains chlorine bleach.  Keep all follow-up visits as told by your health care provider. This is important. Contact a health care provider if:  Your symptoms do not get better with treatment.  Your symptoms get worse with treatment.  Your symptoms go away and then return.  You have a fever.  You have new symptoms. Get help right away if:  You have increasing pain or tenderness in your abdomen.  You have stool that is mostly bloody, or your stool looks dark black and tarry.  You cannot eat or drink without vomiting.  You have signs of dehydration, such as:  Dark urine, very little urine, or no urine.  Cracked lips.  Not making tears when you cry.  Dry mouth.  Sunken eyes.  Sleepiness.  Weakness.  Dizziness. This information is not intended to replace advice given to you by your health care provider. Make sure you discuss any questions you have with your health care provider. Document Released: 10/28/2004 Document Revised: 06/26/2015 Document Reviewed: 07/22/2014  2017 Elsevier

## 2016-03-26 NOTE — Progress Notes (Signed)
Patient: Edward [name] Male    DOB: [birth year]   66 y.o.   MRN: [id number] Visit Date: 03/26/2016  Today's Provider: Vernie Murders, PA   Chief Complaint  Patient presents with  . Hospitalization Follow-up   Subjective:    HPI  Follow up Hospitalization Patient was admitted to [facility] on 03/09/2016 and discharged on 03/19/2016.  He was treated for ST elevation myocardial infarction   Treatment for this included started: aspirin 81 MG chewable tablet atorvastatin 80 MG tablet (LIPITOR) carvedilol 3.125 MG tablet (COREG) ciprofloxacin 500 MG tablet (CIPRO) clopidogrel 75 MG tablet (PLAVIX) losartan 25 MG tablet (COZAAR) metroNIDAZOLE 500 MG tablet (FLAGYL) spironolactone 25 MG tablet (ALDACTONE) warfarin 5 MG tablet (COUMADIN)  Stopped: Ascriptin 325 MG Tabs  Telephone follow up was done on 03/23/2016 He reports good compliance with treatment. He reports this condition is Improved. Still experiencing some heartburn at times and lightheaded.  ------------------------------------------------------------------------------------ Past Medical History:  Diagnosis Date  . Anxiety   . BPH (benign prostatic hyperplasia)   . Hypertension    Past Surgical History:  Procedure Laterality Date  . CORONARY ANGIOGRAPHY N/A 03/09/2016   Procedure: Coronary Angiography;  Surgeon: Nelva Bush, MD;  Location: [place] [facility];  Service: Cardiovascular;  Laterality: N/A;  . CORONARY STENT INTERVENTION N/A 03/09/2016   Procedure: Coronary Stent Intervention;  Surgeon: Nelva Bush, MD;  Location: [place] [facility];  Service: Cardiovascular;  Laterality: N/A;  . HERNIA REPAIR  2005  . HYDROCELE EXCISION     Family History  Problem Relation Age of Onset  . Emphysema Mother   . Macular degeneration Mother   . Heart attack Mother   . Heart attack Father    No Known Allergies  Previous Medications   ASPIRIN 81 MG CHEWABLE TABLET    Chew 1 tablet (81 mg total) by mouth daily.   ATORVASTATIN (LIPITOR) 80 MG TABLET    Take 1 tablet (80 mg total) by mouth daily at 6 PM.   CARVEDILOL (COREG) 3.125 MG TABLET    Take 1 tablet (3.125 mg total) by mouth 2 (two) times daily with a meal.   CLOPIDOGREL (PLAVIX) 75 MG TABLET    Take 1 tablet (75 mg total) by mouth daily.   FAMOTIDINE (PEPCID) 20 MG TABLET    Take 1 tablet (20 mg total) by mouth 2 (two) times daily.   LORAZEPAM (ATIVAN) 0.5 MG TABLET    TAKE 1 TABLET BY MOUTH 3 TIMES A DAY AS NEEDED FOR ANXIETY   LOSARTAN (COZAAR) 25 MG TABLET    Take 0.5 tablets (12.5 mg total) by mouth daily.   METRONIDAZOLE (FLAGYL) 500 MG TABLET    Take 1 tablet (500 mg total) by mouth every 8 (eight) hours.   SPIRONOLACTONE (ALDACTONE) 25 MG TABLET    Take 0.5 tablets (12.5 mg total) by mouth daily.   TAMSULOSIN (FLOMAX) 0.4 MG CAPS CAPSULE    Take 1 capsule (0.4 mg total) by mouth daily.   WARFARIN (COUMADIN) 5 MG TABLET    Take 1 tablet (5 mg total) by mouth daily at 6 PM.    Review of Systems  Constitutional: Negative.   Respiratory: Negative.   Cardiovascular: Negative.   Neurological: Positive for light-headedness.    Social History  Substance Use Topics  . Smoking status: Former Smoker    Start date: 07/03/2007  . Smokeless tobacco: Never Used     Comment: QUIT IN 2005  . Alcohol use 0.0 oz/week  Comment: 3-4 beers every other day   Objective:   BP 108/68 (BP Location: Right Arm, Patient Position: Sitting, Cuff Size: Normal)   Pulse 60   Temp 97.9 F (36.6 C) (Oral)   Resp 16   Wt 212 lb 3.2 oz (96.3 kg)   SpO2 98%   BMI 30.45 kg/m   Physical Exam  Constitutional: He is oriented to person, place, and time. He appears well-developed and well-nourished. No distress.  HENT:  Head: Normocephalic and atraumatic.  Right Ear: Hearing normal.  Left Ear: Hearing normal.  Nose: Nose normal.  Eyes: Conjunctivae and lids are normal. Right eye exhibits no discharge. Left eye exhibits no discharge. No scleral icterus.  Neck:  Neck supple.  Cardiovascular: Normal rate and regular rhythm.   Pulmonary/Chest: Effort normal and breath sounds normal. No respiratory distress.  Abdominal: Soft. Bowel sounds are normal.  Musculoskeletal: Normal range of motion.  Neurological: He is alert and oriented to person, place, and time.  Skin: Skin is intact. No lesion and no rash noted.  Psychiatric: He has a normal mood and affect. His speech is normal and behavior is normal. Thought content normal.      Assessment & Plan:     1. Coronary artery disease involving native coronary artery of native heart without angina pectoris Developed chest discomfort and difficulty breathing the night prior to admission on 03-09-16. EKG showed evidence of STEMI and a drug eluting stent was placed in the LAD. Also, had chronic occlusion of the diagonal 1 and nonobstructive diseaes of the RCA and left circumflex. He tolerated the procedure well and was placed on Coumadin 5 mg qd (patient refused Eliquis) due to LV thrombus on Echo. Will follow up routinely with cardiologist (Dr.Gollan) to recheck protime/INR. Discharged from the hospital on 03-19-16 and feeling improved.   2. Ischemic cardiomyopathy Echocardiogram showed EF at 30 % and presently using a Life Vest for 3 months and taking Coreg with Losartan. No chest pains today. Energy level is still low but improving. Follow up with cardiologist and suspect referral to outpatient cardiac rehabilitation.  3. Essential (primary) hypertension BP lower since hospitalization. Presently on Coreg, Losartan and Spironolactone. Follow up with cardiologist next week.  4. Clostridium difficile diarrhea Developed diarrhea during hospitalization and culture confirmed C.diff. Was placed on Cipro for 5 days and Flagyl for 7 days after the Cipro. Feeling better and normal BM. Recheck if any further symptoms.

## 2016-03-31 ENCOUNTER — Ambulatory Visit (INDEPENDENT_AMBULATORY_CARE_PROVIDER_SITE_OTHER): Payer: Commercial Managed Care - PPO

## 2016-03-31 DIAGNOSIS — I513 Intracardiac thrombosis, not elsewhere classified: Secondary | ICD-10-CM

## 2016-03-31 DIAGNOSIS — Z5181 Encounter for therapeutic drug level monitoring: Secondary | ICD-10-CM

## 2016-03-31 DIAGNOSIS — I236 Thrombosis of atrium, auricular appendage, and ventricle as current complications following acute myocardial infarction: Secondary | ICD-10-CM | POA: Diagnosis not present

## 2016-03-31 DIAGNOSIS — I213 ST elevation (STEMI) myocardial infarction of unspecified site: Secondary | ICD-10-CM | POA: Diagnosis not present

## 2016-03-31 LAB — POCT INR: INR: 2.2

## 2016-04-07 ENCOUNTER — Encounter: Payer: Self-pay | Admitting: Internal Medicine

## 2016-04-07 ENCOUNTER — Ambulatory Visit (INDEPENDENT_AMBULATORY_CARE_PROVIDER_SITE_OTHER): Payer: Commercial Managed Care - PPO | Admitting: Internal Medicine

## 2016-04-07 ENCOUNTER — Ambulatory Visit (INDEPENDENT_AMBULATORY_CARE_PROVIDER_SITE_OTHER): Payer: Commercial Managed Care - PPO

## 2016-04-07 VITALS — BP 104/76 | HR 66 | Ht 70.0 in | Wt 208.2 lb

## 2016-04-07 DIAGNOSIS — I513 Intracardiac thrombosis, not elsewhere classified: Secondary | ICD-10-CM

## 2016-04-07 DIAGNOSIS — Z5181 Encounter for therapeutic drug level monitoring: Secondary | ICD-10-CM | POA: Diagnosis not present

## 2016-04-07 DIAGNOSIS — I236 Thrombosis of atrium, auricular appendage, and ventricle as current complications following acute myocardial infarction: Secondary | ICD-10-CM

## 2016-04-07 DIAGNOSIS — I10 Essential (primary) hypertension: Secondary | ICD-10-CM

## 2016-04-07 DIAGNOSIS — E785 Hyperlipidemia, unspecified: Secondary | ICD-10-CM | POA: Diagnosis not present

## 2016-04-07 DIAGNOSIS — I255 Ischemic cardiomyopathy: Secondary | ICD-10-CM

## 2016-04-07 DIAGNOSIS — I213 ST elevation (STEMI) myocardial infarction of unspecified site: Secondary | ICD-10-CM

## 2016-04-07 DIAGNOSIS — I2510 Atherosclerotic heart disease of native coronary artery without angina pectoris: Secondary | ICD-10-CM | POA: Diagnosis not present

## 2016-04-07 DIAGNOSIS — I5022 Chronic systolic (congestive) heart failure: Secondary | ICD-10-CM | POA: Diagnosis not present

## 2016-04-07 DIAGNOSIS — Z79899 Other long term (current) drug therapy: Secondary | ICD-10-CM | POA: Diagnosis not present

## 2016-04-07 LAB — POCT INR: INR: 1.5

## 2016-04-07 MED ORDER — LOSARTAN POTASSIUM 25 MG PO TABS: 25.0000 mg | ORAL_TABLET | Freq: Every day | ORAL | 3 refills | Status: DC

## 2016-04-07 NOTE — Progress Notes (Signed)
Follow-up Outpatient Visit Date: 04/07/2016  Primary Care Provider: Vernie Murders, [place] [address]  Chief Complaint: Follow-up anterior STEMI and ischemic cardiomyopathy  HPI:  Edward [name] is a 66 y.o. year-old male with history of coronary artery disease status post recent anterior STEMI (late presentation) complicated by ischemic cardiomyopathy with severe LV dysfunction and likely apical thrombus, who presents for follow-up of CAD and cardiomyopathy. He presented to his PCP on 01/06/17 with 12-18 hours of chest pain. Due to abnormal EKG, he was referred to the ER. At the [facility] ED, he was found to have anterior ST elevation with Q waves. Given ongoing chest pain, he was taken for emergent left heart catheterization demonstrating occlusion of the proximal LAD. PCI was challenging but ultimately successful with placement of a resolute 3.0 x 38 mm drug-eluting stent. D2 was jailed by the stent with a 70% ostial stenosis but TIMI 3 flow. Echocardiogram revealed severe LV dysfunction with an EF of 30-35% and significant anterior and apical wall motion abnormality. There was suspicion for apical thrombus, prompting initiation of heparin and warfarin. The patient's hospitalization was complicated by C. difficile colitis. He was discharged home on 03/19/16 after achieving a therapeutic INR. Due to severe LV dysfunction and anterior MI, he was also sent home with a LifeVest.  I last saw the patient on 03/24/16, which time he had episodes of fatigue, particularly when he had been active for several hours. Today, he reports that he overall has been feeling better. He has not had any episodes of fatigue for about a week. He denies chest pain, palpitations, lightheadedness, shortness of breath, orthopnea, PND, and edema. He has experienced several alarms from his LifeVest but has been able to work those out with tech support from [place]. He remains compliant with his medications, including  aspirin, warfarin, and clopidogrel. Of note, INR was slightly subtherapeutic today, which she attributes to recently having discontinued metronidazole for resolved C. difficile infection.  --------------------------------------------------------------------------------------------------  Cardiovascular History & Procedures: Cardiovascular Problems:  CAD s/p anterior STEMI with PCI to proximal LAD (03/2016)  Ischemic cardiomyopathy with severe LV dysfunction  LV apical thrombus  Risk Factors:  Known CAD, HTN, hyperlipidemia, male gender, and age > 19  Cath/PCI:  LHC/PCI (03/09/16): Late presenting STEMI with thrombotic occlusion o fhte proximal/mid LAD involving D1 and D2. Mild to moderate, non-obstructive CAD involving LCx and RCA. Successful IVUS-guided PCI to proximal and mid LAD using Integrity REsolute 3.0 x 38 mm DES (post-dilated up to 4.0 mm) with 0% residual stenosis and TIMI-3 flow. D2 was jailed by the LAD stent resulting in 70% ostial stenosis with TIMI-3 flow.  CV Surgery:  None  EP Procedures and Devices:  None  Non-Invasive Evaluation(s):  TTE (03/10/16): Moderate to severe LV dysfunction with LAD territory akinesis (LVEF 30-35%). Likely mural thrombus at LV apex. Limited evaluation of cardiac valve due to lack of parasternal windows; no significant valvular abnormality identified. Normal RV size and function.  Recent CV Pertinent Labs: Lab Results  Component Value Date   CHOL 187 03/09/2016   CHOL 179 12/16/2015   HDL 47 03/09/2016   HDL 41 12/16/2015   LDLCALC 124 (H) 03/09/2016   LDLCALC 120 (H) 12/16/2015   TRIG 79 03/09/2016   CHOLHDL 4.0 03/09/2016   INR 1.5 04/07/2016   INR 2.31 03/19/2016   K 4.5 03/24/2016   MG 1.9 03/10/2016   BUN 15 03/24/2016   BUN 20 12/16/2015   CREATININE 0.90 03/24/2016  Past medical and surgical history were reviewed and updated in EPIC.  Outpatient Encounter Prescriptions as of 04/07/2016  Medication Sig  . aspirin  81 MG chewable tablet Chew 1 tablet (81 mg total) by mouth daily.  Marland Kitchen atorvastatin (LIPITOR) 80 MG tablet Take 1 tablet (80 mg total) by mouth daily at 6 PM. (Patient taking differently: Take 80 mg by mouth daily at 6 PM. )  . carvedilol (COREG) 3.125 MG tablet Take 1 tablet (3.125 mg total) by mouth 2 (two) times daily with a meal.  . clopidogrel (PLAVIX) 75 MG tablet Take 1 tablet (75 mg total) by mouth daily.  . famotidine (PEPCID) 20 MG tablet Take 1 tablet (20 mg total) by mouth 2 (two) times daily. (Patient taking differently: Take 20 mg by mouth 2 (two) times daily. )  . LORazepam (ATIVAN) 0.5 MG tablet TAKE 1 TABLET BY MOUTH 3 TIMES A DAY AS NEEDED FOR ANXIETY  . losartan (COZAAR) 25 MG tablet Take 0.5 tablets (12.5 mg total) by mouth daily.  Marland Kitchen spironolactone (ALDACTONE) 25 MG tablet Take 0.5 tablets (12.5 mg total) by mouth daily.  Marland Kitchen warfarin (COUMADIN) 7.5 MG tablet Take 7.5 mg by mouth daily.  Marland Kitchen warfarin (COUMADIN) 5 MG tablet Take 1 tablet (5 mg total) by mouth daily at 6 PM. (Patient taking differently: Take 5 mg by mouth daily at 6 PM. )  . [DISCONTINUED] metroNIDAZOLE (FLAGYL) 500 MG tablet Take 1 tablet (500 mg total) by mouth every 8 (eight) hours. (Patient not taking: Reported on 04/07/2016)  . [DISCONTINUED] tamsulosin (FLOMAX) 0.4 MG CAPS capsule Take 1 capsule (0.4 mg total) by mouth daily. (Patient not taking: Reported on 04/07/2016)   No facility-administered encounter medications on file as of 04/07/2016.     Allergies: Patient has no known allergies.  Social History   Social History  . Marital status: Married    Spouse name: N/A  . Number of children: N/A  . Years of education: N/A   Occupational History  . Not on file.   Social History Main Topics  . Smoking status: Former Smoker    Start date: 07/03/2007  . Smokeless tobacco: Never Used     Comment: QUIT IN 2005  . Alcohol use 0.0 oz/week     Comment: 3-4 beers every other day  . Drug use: Yes    Types:  Marijuana     Comment: smokes pot  . Sexual activity: Not on file   Other Topics Concern  . Not on file   Social History Narrative   Living with family, Independent at baseline    Family History  Problem Relation Age of Onset  . Emphysema Mother   . Macular degeneration Mother   . Heart attack Mother   . Heart attack Father     Review of Systems: A 12-system review of systems was performed and was negative except as noted in the HPI.  --------------------------------------------------------------------------------------------------  Physical Exam: BP 104/76 (BP Location: Left Arm, Patient Position: Sitting, Cuff Size: Normal)   Pulse 66   Ht 5\' 10"  (1.778 m)   Wt 208 lb 4 oz (94.5 kg)   BMI 29.88 kg/m   General:  Overweight man, seated comfortably in the exam room. HEENT: No conjunctival pallor or scleral icterus.  Moist mucous membranes.  OP clear. Neck: Supple without lymphadenopathy, thyromegaly, JVD, or HJR.  No carotid bruit. Lungs: Normal work of breathing.  Clear to auscultation bilaterally without wheezes or crackles. Heart: Regular rate and rhythm without murmurs,  rubs, or gallops.  Non-displaced PMI. Abd: Bowel sounds present.  Soft, NT/ND without hepatosplenomegaly Ext: No lower extremity edema.  Radial, PT, and DP pulses are 2+ bilaterally. Right radial arteriotomy site is well-healed. Skin: warm and dry without rash  Lab Results  Component Value Date   WBC 7.1 03/19/2016   HGB 14.4 03/19/2016   HCT 41.4 03/19/2016   MCV 94.3 03/19/2016   PLT 212 03/19/2016    Lab Results  Component Value Date   NA 136 03/24/2016   K 4.5 03/24/2016   CL 105 03/24/2016   CO2 23 03/24/2016   BUN 15 03/24/2016   CREATININE 0.90 03/24/2016   GLUCOSE 103 (H) 03/24/2016   ALT 52 03/09/2016    Lab Results  Component Value Date   CHOL 187 03/09/2016   HDL 47 03/09/2016   LDLCALC 124 (H) 03/09/2016   TRIG 79 03/09/2016   CHOLHDL 4.0 03/09/2016     --------------------------------------------------------------------------------------------------  ASSESSMENT AND PLAN: Coronary artery disease status post recent STEMI Mr. Friedlander is doing well. He has not had any further angina. Breathing is also comfortable. We will continue atorvastatin 80 mg daily and carvedilol 3.125 mg twice a day. Given that his INR is subtherapeutic today, we will also continue with aspirin. Once his INR has become therapeutic again, we will discontinue aspirin and continue warfarin and clopidogrel for the remainder of a 12 month course following PCI. I would like to defer cardiac rehabilitation until we have completed treatment of his mural thrombus and reevaluated his EF to determine if he can discontinue wearing the LifeVest or will need ICD placement.  Ischemic cardiomyopathy and chronic systolic heart failure Today, the patient appears to be doing better. He reports less fatigue over the last week, consistent with NYHA class II heart failure symptoms. He appears euvolemic on exam today as well. He is tolerating his current evidence based heart failure therapy well, including carvedilol 3.125 mg twice a day, spironolactone 25 mg daily, and losartan 12.5 mg twice a day. Though his blood pressure is low normal, he is not symptomatic. We have therefore agreed to increase losartan to 25 mg daily. We will repeat a basic metabolic panel when he returns in one week to ensure stable renal function and potassium. If he tolerates losartan at this higher dose, we could consider transitioning him to [facility] in the future. The patient is to continue wearing his LifeVest.  LV apical thrombus No embolic symptoms. INR was subtherapeutic today, with dose adjustments made per anticoagulation clinic. We will continue a 3 month course of anticoagulation and then repeat echo with Definity to reevaluate thrombus.  Hypertension Blood pressure low normal today. We will increase losartan, as  above  Hyperlipidemia Patient tolerating high-intensity statin therapy well. We will repeat lipid panel and ALT when the patient follows up in a month.  Follow-up: Return to clinic in 1 month.  Nelva Bush, MD 04/07/2016 9:14 PM

## 2016-04-07 NOTE — Patient Instructions (Signed)
Medication Instructions:  Your physician has recommended you make the following change in your medication:  1- INCREASE Losartan to 25 mg by mouth once a day.   Labwork: Your physician recommends that you return for lab work in: 1 WEEK (bmp) ON THE DAY YOU COME BACK FOR COUMADIN CHECK.   Testing/Procedures: none  Follow-Up: Your physician recommends that you schedule a follow-up appointment in: [place].  If you need a refill on your cardiac medications before your next appointment, please call your pharmacy.

## 2016-04-09 ENCOUNTER — Other Ambulatory Visit: Payer: Self-pay | Admitting: Internal Medicine

## 2016-04-09 NOTE — Telephone Encounter (Signed)
Please review for refill. Thanks!  

## 2016-04-14 ENCOUNTER — Telehealth: Payer: Self-pay | Admitting: Internal Medicine

## 2016-04-14 ENCOUNTER — Ambulatory Visit (INDEPENDENT_AMBULATORY_CARE_PROVIDER_SITE_OTHER): Payer: Commercial Managed Care - PPO

## 2016-04-14 ENCOUNTER — Other Ambulatory Visit: Payer: Self-pay

## 2016-04-14 ENCOUNTER — Other Ambulatory Visit: Payer: Self-pay | Admitting: *Deleted

## 2016-04-14 ENCOUNTER — Other Ambulatory Visit (INDEPENDENT_AMBULATORY_CARE_PROVIDER_SITE_OTHER): Payer: Commercial Managed Care - PPO

## 2016-04-14 DIAGNOSIS — Z5181 Encounter for therapeutic drug level monitoring: Secondary | ICD-10-CM | POA: Diagnosis not present

## 2016-04-14 DIAGNOSIS — I213 ST elevation (STEMI) myocardial infarction of unspecified site: Secondary | ICD-10-CM

## 2016-04-14 DIAGNOSIS — I10 Essential (primary) hypertension: Secondary | ICD-10-CM | POA: Diagnosis not present

## 2016-04-14 DIAGNOSIS — I236 Thrombosis of atrium, auricular appendage, and ventricle as current complications following acute myocardial infarction: Secondary | ICD-10-CM

## 2016-04-14 DIAGNOSIS — Z79899 Other long term (current) drug therapy: Secondary | ICD-10-CM

## 2016-04-14 DIAGNOSIS — I513 Intracardiac thrombosis, not elsewhere classified: Secondary | ICD-10-CM | POA: Diagnosis not present

## 2016-04-14 LAB — POCT INR: INR: 1.2

## 2016-04-14 MED ORDER — SPIRONOLACTONE 25 MG PO TABS: 12.5000 mg | ORAL_TABLET | Freq: Every day | ORAL | 3 refills | Status: DC

## 2016-04-14 MED ORDER — WARFARIN SODIUM 5 MG PO TABS: ORAL_TABLET | ORAL | 3 refills | Status: DC

## 2016-04-14 MED ORDER — ATORVASTATIN CALCIUM 80 MG PO TABS: 80.0000 mg | ORAL_TABLET | Freq: Every day | ORAL | 3 refills | Status: DC

## 2016-04-14 MED ORDER — CLOPIDOGREL BISULFATE 75 MG PO TABS: 75.0000 mg | ORAL_TABLET | Freq: Every day | ORAL | 3 refills | Status: DC

## 2016-04-14 MED ORDER — CARVEDILOL 3.125 MG PO TABS: 3.1250 mg | ORAL_TABLET | Freq: Two times a day (BID) | ORAL | 3 refills | Status: DC

## 2016-04-14 NOTE — Telephone Encounter (Signed)
Pharmacy calling to let us know  We sent in [place]  But pt is on Plavix Would like to verify what patient is to be on Please advise.

## 2016-04-14 NOTE — Telephone Encounter (Signed)
Pt seen in [facility] today, requesting refills on cardiac meds pt was discharged from hospital on.  Please refill to [facility].  Thanks

## 2016-04-14 NOTE — Telephone Encounter (Signed)
S/w pharmacist and clarified Dr End has prescribed both warfarin and plavix as addressed at last [place] on 04/07/16: "Given that his INR is subtherapeutic today, we will also continue with aspirin. Once his INR has become therapeutic again, we will discontinue aspirin and continue warfarin and clopidogrel for the remainder of a 12 month course following PCI."  He verbalized understanding.

## 2016-04-14 NOTE — Telephone Encounter (Signed)
Pt requesting refill on cardiac medications listed below, not prescribed by Dr. Saunders Revel. Please advise if ok to refill?

## 2016-04-14 NOTE — Telephone Encounter (Signed)
Will you Please Advise. Thanks!

## 2016-04-15 LAB — BASIC METABOLIC PANEL
BUN/Creatinine Ratio: 25 — ABNORMAL HIGH (ref 10–24)
BUN: 19 mg/dL (ref 8–27)
CALCIUM: 9.2 mg/dL (ref 8.6–10.2)
CHLORIDE: 101 mmol/L (ref 96–106)
CO2: 22 mmol/L (ref 18–29)
Creatinine, Ser: 0.77 mg/dL (ref 0.76–1.27)
GFR calc Af Amer: 111 mL/min/{1.73_m2} (ref >59)
GFR, EST NON AFRICAN AMERICAN: 96 mL/min/{1.73_m2} (ref >59)
GLUCOSE: 87 mg/dL (ref 65–99)
POTASSIUM: 4.2 mmol/L (ref 3.5–5.2)
SODIUM: 140 mmol/L (ref 134–144)

## 2016-04-21 ENCOUNTER — Ambulatory Visit (INDEPENDENT_AMBULATORY_CARE_PROVIDER_SITE_OTHER): Payer: Commercial Managed Care - PPO

## 2016-04-21 DIAGNOSIS — I513 Intracardiac thrombosis, not elsewhere classified: Secondary | ICD-10-CM | POA: Diagnosis not present

## 2016-04-21 DIAGNOSIS — Z5181 Encounter for therapeutic drug level monitoring: Secondary | ICD-10-CM

## 2016-04-21 DIAGNOSIS — I236 Thrombosis of atrium, auricular appendage, and ventricle as current complications following acute myocardial infarction: Secondary | ICD-10-CM | POA: Diagnosis not present

## 2016-04-21 DIAGNOSIS — I213 ST elevation (STEMI) myocardial infarction of unspecified site: Secondary | ICD-10-CM

## 2016-04-21 LAB — POCT INR: INR: 1.3

## 2016-04-28 ENCOUNTER — Ambulatory Visit (INDEPENDENT_AMBULATORY_CARE_PROVIDER_SITE_OTHER): Payer: Commercial Managed Care - PPO

## 2016-04-28 DIAGNOSIS — I513 Intracardiac thrombosis, not elsewhere classified: Secondary | ICD-10-CM

## 2016-04-28 DIAGNOSIS — I236 Thrombosis of atrium, auricular appendage, and ventricle as current complications following acute myocardial infarction: Secondary | ICD-10-CM

## 2016-04-28 DIAGNOSIS — I213 ST elevation (STEMI) myocardial infarction of unspecified site: Secondary | ICD-10-CM

## 2016-04-28 DIAGNOSIS — Z5181 Encounter for therapeutic drug level monitoring: Secondary | ICD-10-CM | POA: Diagnosis not present

## 2016-04-28 LAB — POCT INR: INR: 1.4

## 2016-05-05 ENCOUNTER — Ambulatory Visit (INDEPENDENT_AMBULATORY_CARE_PROVIDER_SITE_OTHER): Payer: Commercial Managed Care - PPO

## 2016-05-05 DIAGNOSIS — I236 Thrombosis of atrium, auricular appendage, and ventricle as current complications following acute myocardial infarction: Secondary | ICD-10-CM

## 2016-05-05 DIAGNOSIS — I213 ST elevation (STEMI) myocardial infarction of unspecified site: Secondary | ICD-10-CM

## 2016-05-05 DIAGNOSIS — I513 Intracardiac thrombosis, not elsewhere classified: Secondary | ICD-10-CM | POA: Diagnosis not present

## 2016-05-05 DIAGNOSIS — Z5181 Encounter for therapeutic drug level monitoring: Secondary | ICD-10-CM

## 2016-05-05 LAB — POCT INR: INR: 2

## 2016-05-12 ENCOUNTER — Encounter: Payer: Self-pay | Admitting: Internal Medicine

## 2016-05-12 ENCOUNTER — Ambulatory Visit (INDEPENDENT_AMBULATORY_CARE_PROVIDER_SITE_OTHER): Payer: Commercial Managed Care - PPO

## 2016-05-12 ENCOUNTER — Other Ambulatory Visit
Admission: RE | Admit: 2016-05-12 | Discharge: 2016-05-12 | Disposition: A | Discharge status: Home / Self Care | Payer: Commercial Managed Care - PPO | Source: Ambulatory Visit | Attending: Internal Medicine | Admitting: Internal Medicine

## 2016-05-12 ENCOUNTER — Ambulatory Visit (INDEPENDENT_AMBULATORY_CARE_PROVIDER_SITE_OTHER): Payer: Commercial Managed Care - PPO | Admitting: Internal Medicine

## 2016-05-12 VITALS — BP 120/68 | HR 58 | Ht 70.0 in | Wt 209.5 lb

## 2016-05-12 DIAGNOSIS — I5022 Chronic systolic (congestive) heart failure: Secondary | ICD-10-CM

## 2016-05-12 DIAGNOSIS — I2510 Atherosclerotic heart disease of native coronary artery without angina pectoris: Secondary | ICD-10-CM | POA: Diagnosis not present

## 2016-05-12 DIAGNOSIS — I10 Essential (primary) hypertension: Secondary | ICD-10-CM

## 2016-05-12 DIAGNOSIS — I513 Intracardiac thrombosis, not elsewhere classified: Secondary | ICD-10-CM

## 2016-05-12 DIAGNOSIS — Z79899 Other long term (current) drug therapy: Secondary | ICD-10-CM

## 2016-05-12 DIAGNOSIS — I236 Thrombosis of atrium, auricular appendage, and ventricle as current complications following acute myocardial infarction: Secondary | ICD-10-CM

## 2016-05-12 DIAGNOSIS — I255 Ischemic cardiomyopathy: Secondary | ICD-10-CM

## 2016-05-12 DIAGNOSIS — E785 Hyperlipidemia, unspecified: Secondary | ICD-10-CM

## 2016-05-12 DIAGNOSIS — I213 ST elevation (STEMI) myocardial infarction of unspecified site: Secondary | ICD-10-CM

## 2016-05-12 DIAGNOSIS — Z5181 Encounter for therapeutic drug level monitoring: Secondary | ICD-10-CM

## 2016-05-12 LAB — POCT INR: INR: 2.6

## 2016-05-12 LAB — BASIC METABOLIC PANEL
ANION GAP: 6 (ref 5–15)
BUN: 20 mg/dL (ref 6–20)
CHLORIDE: 105 mmol/L (ref 101–111)
CO2: 27 mmol/L (ref 22–32)
Calcium: 9.1 mg/dL (ref 8.9–10.3)
Creatinine, Ser: 0.76 mg/dL (ref 0.61–1.24)
GFR calc Af Amer: >60 mL/min (ref >60)
GLUCOSE: 99 mg/dL (ref 65–99)
POTASSIUM: 4.1 mmol/L (ref 3.5–5.1)
Sodium: 138 mmol/L (ref 135–145)

## 2016-05-12 MED ORDER — LOSARTAN POTASSIUM 25 MG PO TABS: 50.0000 mg | ORAL_TABLET | Freq: Every day | ORAL | 3 refills | Status: DC

## 2016-05-12 MED ORDER — WARFARIN SODIUM 10 MG PO TABS: ORAL_TABLET | ORAL | 1 refills | Status: DC

## 2016-05-12 NOTE — Patient Instructions (Signed)
Medication Instructions:  Your physician has recommended you make the following change in your medication:  1- STOP taking Aspirin. 2- INCREASE Losartan to 50 mg (2 tablets) by mouth once a day. If you are unable to tolerate this amount you may take Losartan 37.5 mg (1.5 tablets) by mouth once a day.   Labwork: Your physician recommends that you return for lab work in: [place] (BMP) at the [facility]. - Please go to the [facility]. You will check in at the front desk to the right as you walk into the atrium. Valet Parking is offered if needed.    Your physician recommends that you return for lab work in: [place]. (BMP).   Testing/Procedures: Your physician has requested that you have an echocardiogram. Echocardiography is a painless test that uses sound waves to create images of your heart. It provides your doctor with information about the size and shape of your heart and how well your heart's chambers and valves are working. This procedure takes approximately one hour. There are no restrictions for this procedure.  - You are scheduled for Jun 30, 2016, Please arrive at 09:30 am at the [facility] for a 10:00 am appointment. There is no special preparation for this procedure.    Follow-Up: Your physician recommends that you schedule a follow-up appointment in [place].   If you need a refill on your cardiac medications before your next appointment, please call your pharmacy.  Echocardiogram An echocardiogram, or echocardiography, uses sound waves (ultrasound) to produce an image of your heart. The echocardiogram is simple, painless, obtained within a short period of time, and offers valuable information to your health care provider. The images from an echocardiogram can provide information such as:  Evidence of coronary artery disease (CAD).  Heart size.  Heart muscle function.  Heart valve  function.  Aneurysm detection.  Evidence of a past heart attack.  Fluid buildup around the heart.  Heart muscle thickening.  Assess heart valve function. Tell a health care provider about:  Any allergies you have.  All medicines you are taking, including vitamins, herbs, eye drops, creams, and over-the-counter medicines.  Any problems you or family members have had with anesthetic medicines.  Any blood disorders you have.  Any surgeries you have had.  Any medical conditions you have.  Whether you are pregnant or may be pregnant. What happens before the procedure? No special preparation is needed. Eat and drink normally. What happens during the procedure?  In order to produce an image of your heart, gel will be applied to your chest and a wand-like tool (transducer) will be moved over your chest. The gel will help transmit the sound waves from the transducer. The sound waves will harmlessly bounce off your heart to allow the heart images to be captured in real-time motion. These images will then be recorded.  You may need an IV to receive a medicine that improves the quality of the pictures. What happens after the procedure? You may return to your normal schedule including diet, activities, and medicines, unless your health care provider tells you otherwise. This information is not intended to replace advice given to you by your health care provider. Make sure you discuss any questions you have with your health care provider. Document Released: 01/16/2000 Document Revised: 09/06/2015 Document Reviewed: 09/25/2012 Elsevier Interactive Patient Education  2017 [employer].

## 2016-05-12 NOTE — Progress Notes (Signed)
Follow-up Outpatient Visit Date: 05/12/2016  Primary Care Provider: Vernie Murders, [place] [address]  Chief Complaint: Follow-up anterior STEMI and ischemic cardiomyopathy  HPI:  [name] is a 66 y.o. year-old male with history of coronary artery disease status post recent anterior STEMI (late presentation) complicated by ischemic cardiomyopathy with severe LV dysfunction and likely apical thrombus, who presents for follow-up of CAD and cardiomyopathy. He presented to his PCP on 01/06/17 with 12-18 hours of chest pain. Due to abnormal EKG, he was referred to the ER. At the [facility] ED, he was found to have anterior ST elevation with Q waves. Given ongoing chest pain, he was taken for emergent left heart catheterization demonstrating occlusion of the proximal LAD. PCI was challenging but ultimately successful with placement of a resolute 3.0 x 38 mm drug-eluting stent. D2 was jailed by the stent with a 70% ostial stenosis but TIMI 3 flow. Echocardiogram revealed severe LV dysfunction with an EF of 30-35% and significant anterior and apical wall motion abnormality. There was suspicion for apical thrombus, prompting initiation of heparin and warfarin. The patient's hospitalization was complicated by C. difficile colitis. He was discharged home on 03/19/16 after achieving a therapeutic INR. Due to severe LV dysfunction and anterior MI, he was also sent home with a LifeVest.  I last saw him on 04/07/16. He has done well since that time. He continues to have occasional periods of fatigue if he overexerts himself. He also noted heartburn earlier this week after having a late lunch. He did not have the arm heaviness that he experienced at the time of his STEMI. He denies shortness of breath, palpitations, lightheadedness, orthopnea, PND, and edema. He has occasional alarms from the LifeVest but no shocks. He has spoken with [facility] regarding the alarms, which were reportedly felt to represent  artifact. He remains compliant with ASA, warfarin, and clopidogrel. He noted occasional blood streaks when blowing his nose, though his has been a long-standing problem. It has not worsened with anticoagulation/antiplatelet therapy.  --------------------------------------------------------------------------------------------------  Cardiovascular History & Procedures: Cardiovascular Problems:  CAD s/p anterior STEMI with PCI to proximal LAD (03/2016)  Ischemic cardiomyopathy with severe LV dysfunction  LV apical thrombus  Risk Factors:  Known CAD, HTN, hyperlipidemia, male gender, and age > 21  Cath/PCI:  LHC/PCI (03/09/16): Late presenting STEMI with thrombotic occlusion o fhte proximal/mid LAD involving D1 and D2. Mild to moderate, non-obstructive CAD involving LCx and RCA. Successful IVUS-guided PCI to proximal and mid LAD using Integrity Resolute 3.0 x 38 mm DES (post-dilated up to 4.0 mm) with 0% residual stenosis and TIMI-3 flow. D2 was jailed by the LAD stent resulting in 70% ostial stenosis with TIMI-3 flow.  CV Surgery:  None  EP Procedures and Devices:  None  Non-Invasive Evaluation(s):  TTE (03/10/16): Moderate to severe LV dysfunction with LAD territory akinesis (LVEF 30-35%). Likely mural thrombus at LV apex. Limited evaluation of cardiac valve due to lack of parasternal windows; no significant valvular abnormality identified. Normal RV size and function.  Recent CV Pertinent Labs: Lab Results  Component Value Date   CHOL 187 03/09/2016   CHOL 179 12/16/2015   HDL 47 03/09/2016   HDL 41 12/16/2015   LDLCALC 124 (H) 03/09/2016   LDLCALC 120 (H) 12/16/2015   TRIG 79 03/09/2016   CHOLHDL 4.0 03/09/2016   INR 2.6 05/12/2016   INR 2.31 03/19/2016   K 4.2 04/14/2016   MG 1.9 03/10/2016   BUN 19 04/14/2016  CREATININE 0.77 04/14/2016    Past medical and surgical history were reviewed and updated in EPIC.  Outpatient Encounter Prescriptions as of 05/12/2016    Medication Sig  . aspirin 81 MG chewable tablet Chew 1 tablet (81 mg total) by mouth daily.  Marland Kitchen atorvastatin (LIPITOR) 80 MG tablet Take 1 tablet (80 mg total) by mouth daily.  . carvedilol (COREG) 3.125 MG tablet Take 1 tablet (3.125 mg total) by mouth 2 (two) times daily.  . clopidogrel (PLAVIX) 75 MG tablet Take 1 tablet (75 mg total) by mouth daily.  . famotidine (PEPCID) 20 MG tablet Take 1 tablet (20 mg total) by mouth 2 (two) times daily. (Patient taking differently: Take 20 mg by mouth 2 (two) times daily. )  . LORazepam (ATIVAN) 0.5 MG tablet TAKE 1 TABLET BY MOUTH 3 TIMES A DAY AS NEEDED FOR ANXIETY  . losartan (COZAAR) 25 MG tablet Take 1 tablet (25 mg total) by mouth daily.  Marland Kitchen spironolactone (ALDACTONE) 25 MG tablet Take 0.5 tablets (12.5 mg total) by mouth daily.  Marland Kitchen warfarin (COUMADIN) 10 MG tablet Take as directed by [facility]  . [DISCONTINUED] warfarin (COUMADIN) 5 MG tablet Take as directed by [facility]   No facility-administered encounter medications on file as of 05/12/2016.     Allergies: Patient has no known allergies.  Social History   Social History  . Marital status: Married    Spouse name: N/A  . Number of children: N/A  . Years of education: N/A   Occupational History  . Not on file.   Social History Main Topics  . Smoking status: Former Smoker    Start date: 07/03/2007  . Smokeless tobacco: Never Used     Comment: QUIT IN 2005  . Alcohol use 0.0 oz/week     Comment: 3-4 beers every other day  . Drug use: Yes    Types: Marijuana     Comment: smokes pot  . Sexual activity: Not on file   Other Topics Concern  . Not on file   Social History Narrative   Living with family, Independent at baseline    Family History  Problem Relation Age of Onset  . Emphysema Mother   . Macular degeneration Mother   . Heart attack Mother   . Heart attack Father     Review of Systems: A 12-system review of systems was performed and was negative  except as noted in the HPI.  --------------------------------------------------------------------------------------------------  Physical Exam: BP 120/68 (BP Location: Left Arm, Patient Position: Sitting, Cuff Size: Normal)   Pulse (!) 58   Ht 5\' 10"  (1.778 m)   Wt 209 lb 8 oz (95 kg)   BMI 30.06 kg/m   General:  Overweight man, seated comfortably in the exam room. HEENT: No conjunctival pallor or scleral icterus.  Moist mucous membranes.  OP clear. Neck: Supple without lymphadenopathy, thyromegaly, JVD, or HJR. Lungs: Normal work of breathing.  Clear to auscultation bilaterally without wheezes or crackles. Heart: Regular rate and rhythm without murmurs, rubs, or gallops.  Non-displaced PMI. LiveVest in place Abd: Bowel sounds present.  Soft, NT/ND without hepatosplenomegaly Ext: No lower extremity edema.  Radial, PT, and DP pulses are 2+ bilaterally. Right radial arteriotomy site is well-healed. Skin: warm and dry without rash  EKG: Normal sinus rhythm with left axis deviation and septal Q-waves. Previously noted anterolateral T-wave inversions are no longer present (I have personally reviewed both tracings).  Lab Results  Component Value Date   WBC 7.1 03/19/2016  HGB 14.4 03/19/2016   HCT 41.4 03/19/2016   MCV 94.3 03/19/2016   PLT 212 03/19/2016    Lab Results  Component Value Date   NA 140 04/14/2016   K 4.2 04/14/2016   CL 101 04/14/2016   CO2 22 04/14/2016   BUN 19 04/14/2016   CREATININE 0.77 04/14/2016   GLUCOSE 87 04/14/2016   ALT 52 03/09/2016    Lab Results  Component Value Date   CHOL 187 03/09/2016   HDL 47 03/09/2016   LDLCALC 124 (H) 03/09/2016   TRIG 79 03/09/2016   CHOLHDL 4.0 03/09/2016    --------------------------------------------------------------------------------------------------  ASSESSMENT AND PLAN: Coronary artery disease status post recent STEMI Overall doing well. One episode of indigestion but not the same feeling that brought  him in for his MI in February. We will continue his current regimen for secondary prevention, with the exception of aspirin. As his INR has been therapeutic for the last few weeks, we will continue with warfarin and clopidogrel only.  Ischemic cardiomyopathy and chronic systolic heart failure Patient is euvolemic and well-compensated today with NYHA class II symptoms. Heart rate limits escalation of carvedilol. We will increase losartan to 50 mg daily. We will check BMP today and again in ~2 weeks to ensure stable renal function and potassium. Continue spironolactone. Plan to repeat echo in one month, as it will have been 3 months since his STEMI and PCI. He will continue with LifeVest until then.  LV apical thrombus No embolic symptoms. INR has been therapeutic last week and today. Continue warfarin. We will recheck an echo with Definity next month to evaluate for resolution of the thrombus.  Hypertension Blood pressure normal today. Patient tolerating increased dose of losartan. We will increase losartan further today, as above  Hyperlipidemia Patient tolerating high-intensity statin therapy well. Continue current therapy.  Follow-up: Return to clinic in 1 month.  Nelva Bush, MD 05/13/2016 7:22 AM

## 2016-05-13 ENCOUNTER — Encounter: Payer: Self-pay | Admitting: Internal Medicine

## 2016-05-13 ENCOUNTER — Telehealth: Payer: Self-pay | Admitting: *Deleted

## 2016-05-13 DIAGNOSIS — E785 Hyperlipidemia, unspecified: Secondary | ICD-10-CM

## 2016-05-13 DIAGNOSIS — I10 Essential (primary) hypertension: Secondary | ICD-10-CM

## 2016-05-13 NOTE — Telephone Encounter (Signed)
-----   Message from Nelva Bush, MD sent at 05/13/2016  7:22 AM EDT ----- Regarding: Labs Hi Jennifer,  Can we also check a lipid panel and ALT when Mr. Yeakle comes back in 2 weeks for a BMP and INR? Thanks.   Gerald Stabs

## 2016-05-13 NOTE — Telephone Encounter (Signed)
Labs orders entered. Patient notified to be fasting, nothing to eat or drink after midnight, for lab work scheduled for 05/26/16 here at the office. He verbalized understanding.

## 2016-05-26 ENCOUNTER — Ambulatory Visit (INDEPENDENT_AMBULATORY_CARE_PROVIDER_SITE_OTHER): Payer: Commercial Managed Care - PPO

## 2016-05-26 ENCOUNTER — Other Ambulatory Visit (INDEPENDENT_AMBULATORY_CARE_PROVIDER_SITE_OTHER): Payer: Commercial Managed Care - PPO

## 2016-05-26 DIAGNOSIS — E785 Hyperlipidemia, unspecified: Secondary | ICD-10-CM

## 2016-05-26 DIAGNOSIS — I236 Thrombosis of atrium, auricular appendage, and ventricle as current complications following acute myocardial infarction: Secondary | ICD-10-CM

## 2016-05-26 DIAGNOSIS — Z5181 Encounter for therapeutic drug level monitoring: Secondary | ICD-10-CM

## 2016-05-26 DIAGNOSIS — I10 Essential (primary) hypertension: Secondary | ICD-10-CM

## 2016-05-26 DIAGNOSIS — Z79899 Other long term (current) drug therapy: Secondary | ICD-10-CM

## 2016-05-26 DIAGNOSIS — I5022 Chronic systolic (congestive) heart failure: Secondary | ICD-10-CM

## 2016-05-26 LAB — POCT INR: INR: 2.7

## 2016-05-27 LAB — BASIC METABOLIC PANEL
BUN/Creatinine Ratio: 19 (ref 10–24)
BUN: 16 mg/dL (ref 8–27)
CALCIUM: 9 mg/dL (ref 8.6–10.2)
CHLORIDE: 101 mmol/L (ref 96–106)
CO2: 21 mmol/L (ref 18–29)
Creatinine, Ser: 0.83 mg/dL (ref 0.76–1.27)
GFR, EST AFRICAN AMERICAN: 107 mL/min/{1.73_m2} (ref >59)
GFR, EST NON AFRICAN AMERICAN: 93 mL/min/{1.73_m2} (ref >59)
Glucose: 96 mg/dL (ref 65–99)
Potassium: 4.3 mmol/L (ref 3.5–5.2)
Sodium: 142 mmol/L (ref 134–144)

## 2016-05-27 LAB — ALT: ALT: 21 IU/L (ref 0–44)

## 2016-05-31 ENCOUNTER — Telehealth: Payer: Self-pay | Admitting: *Deleted

## 2016-05-31 NOTE — Telephone Encounter (Signed)
-----   Message from Nelva Bush, MD sent at 05/28/2016  9:52 PM EDT ----- Please let Mr. Exley know that his kidney function, electrolytes, and liver function remain normal. He should continue his current medications and follow-up as previously discussed. Thanks.

## 2016-05-31 NOTE — Telephone Encounter (Signed)
Please try to move his echo up to be done in mid May. That way we can hopefully d/c the LifeVest or refer him for ICD placement before June. Thanks.

## 2016-05-31 NOTE — Telephone Encounter (Signed)
Results called to pt. Pt verbalized understanding. He asked if there was any way get his LifeVest off by the end of May so that he will not have to pay for it in June. It costs him about $600/month. He is afraid they will charge him for the whole month of June even if he only wears it for a few days. Encouraged patient to call his insurance to find out how he will be charged.  He verbalized understanding.  He would like to check with Dr End to see if he can remove vest before June. Looks like patient has been wearing LifeVest since 03/12/16 when in the hospital. Patient thought LifeVest was to be discontinued after 90 days. Discussed last office visit with Dr End on 05/12/16: "Plan to repeat echo in one month, as it will have been 3 months since his STEMI and PCI. He will continue with LifeVest until then." Patient is scheduled for echo with definity on 06/30/16 and OV with Dr End on 07/07/16. I called insurance company and they could not tell me if he would be charged by the day or the month after using CPT code 93292, I 25.5. Will route to Dr End for advice on if we can move the echo and appt to be done earlier so that patient will not get charged for vest in June.    It also appears the lipid panel was not included on patient's resulted labs. Called [facility] and added on Lipid panel. Once they pull the specimen to see if there is enough blood for sample, they will fax [place] a confirmation.

## 2016-06-01 NOTE — Telephone Encounter (Signed)
Called scheduling and had Echo with Definity rescheduled for 06/15/16 at 1000 am with arrival time on 0945 am.  Appt with Dr End rescheduled for 06/16/16 at 1:20 pm.  Patient notified, wrote down dates and times, and read them back correctly.

## 2016-06-15 ENCOUNTER — Ambulatory Visit
Admission: RE | Admit: 2016-06-15 | Discharge: 2016-06-15 | Disposition: A | Discharge status: Home / Self Care | Payer: Commercial Managed Care - PPO | Source: Ambulatory Visit | Attending: Internal Medicine | Admitting: Internal Medicine

## 2016-06-15 DIAGNOSIS — I10 Essential (primary) hypertension: Secondary | ICD-10-CM | POA: Diagnosis not present

## 2016-06-15 DIAGNOSIS — I236 Thrombosis of atrium, auricular appendage, and ventricle as current complications following acute myocardial infarction: Secondary | ICD-10-CM | POA: Diagnosis not present

## 2016-06-15 DIAGNOSIS — I110 Hypertensive heart disease with heart failure: Secondary | ICD-10-CM | POA: Insufficient documentation

## 2016-06-15 DIAGNOSIS — I5022 Chronic systolic (congestive) heart failure: Secondary | ICD-10-CM | POA: Insufficient documentation

## 2016-06-15 NOTE — Progress Notes (Signed)
*  PRELIMINARY RESULTS* Echocardiogram 2D Echocardiogram has been performed.  Edward [name] 06/15/2016, 11:03 AM

## 2016-06-16 ENCOUNTER — Ambulatory Visit (INDEPENDENT_AMBULATORY_CARE_PROVIDER_SITE_OTHER): Payer: Commercial Managed Care - PPO | Admitting: Internal Medicine

## 2016-06-16 ENCOUNTER — Encounter: Payer: Self-pay | Admitting: Internal Medicine

## 2016-06-16 ENCOUNTER — Ambulatory Visit (INDEPENDENT_AMBULATORY_CARE_PROVIDER_SITE_OTHER): Payer: Commercial Managed Care - PPO | Admitting: *Deleted

## 2016-06-16 VITALS — BP 98/70 | HR 59 | Ht 70.0 in | Wt 207.8 lb

## 2016-06-16 DIAGNOSIS — I2510 Atherosclerotic heart disease of native coronary artery without angina pectoris: Secondary | ICD-10-CM | POA: Diagnosis not present

## 2016-06-16 DIAGNOSIS — I255 Ischemic cardiomyopathy: Secondary | ICD-10-CM

## 2016-06-16 DIAGNOSIS — I10 Essential (primary) hypertension: Secondary | ICD-10-CM | POA: Diagnosis not present

## 2016-06-16 DIAGNOSIS — I236 Thrombosis of atrium, auricular appendage, and ventricle as current complications following acute myocardial infarction: Secondary | ICD-10-CM | POA: Diagnosis not present

## 2016-06-16 DIAGNOSIS — Z5181 Encounter for therapeutic drug level monitoring: Secondary | ICD-10-CM

## 2016-06-16 DIAGNOSIS — E785 Hyperlipidemia, unspecified: Secondary | ICD-10-CM | POA: Diagnosis not present

## 2016-06-16 DIAGNOSIS — I5022 Chronic systolic (congestive) heart failure: Secondary | ICD-10-CM

## 2016-06-16 LAB — POCT INR: INR: 4.1

## 2016-06-16 MED ORDER — ASPIRIN EC 81 MG PO TBEC: 81.0000 mg | DELAYED_RELEASE_TABLET | Freq: Every day | ORAL | 3 refills | Status: DC

## 2016-06-16 NOTE — Progress Notes (Signed)
Follow-up Outpatient Visit Date: 06/16/2016  Primary Care Provider: Margo Common, [place] [address]  Chief Complaint: Follow-up anterior STEMI and ischemic cardiomyopathy  HPI:  Edward [name] is a 66 y.o. year-old male with history of coronary artery disease status post recent anterior STEMI (late presentation) complicated by ischemic cardiomyopathy with severe LV dysfunction and likely apical thrombus, who presents for follow-up of CAD and cardiomyopathy. I last saw him on 05/12/16, at which time he was doing fairly well. He noted occasional "indegestion" and fatigue. Today, he continues to note intermittent indigestion as well as soreness in the center of his chest. This is most pronounced when he bends over; he questions if it could be related to chest wall irritation from the LifeVest. He also notes brief sticking pain in the left should, which is not related to any particular activity. He has not had any significant arm "heaviness," which he experienced leading up to his MI this winter. He is tolerating his current medication regimen well, including increased dose of losartan. He remains compliant with his medications, including clopidogrel and warfarin. He has not had any significant bleeding.  --------------------------------------------------------------------------------------------------  Cardiovascular History & Procedures: Cardiovascular Problems:  CAD s/p anterior STEMI with PCI to proximal LAD (03/2016)  Ischemic cardiomyopathy with severe LV dysfunction  LV apical thrombus  Risk Factors:  Known CAD, HTN, hyperlipidemia, male gender, and age > 31  Cath/PCI:  LHC/PCI (03/09/16): Late presenting STEMI with thrombotic occlusion o fhte proximal/mid LAD involving D1 and D2. Mild to moderate, non-obstructive CAD involving LCx and RCA. Successful IVUS-guided PCI to proximal and mid LAD using Integrity Resolute 3.0 x 38 mm DES (post-dilated up to 4.0 mm)  with 0% residual stenosis and TIMI-3 flow. D2 was jailed by the LAD stent resulting in 70% ostial stenosis with TIMI-3 flow.  CV Surgery:  None  EP Procedures and Devices:  None  Non-Invasive Evaluation(s):  TTE (06/15/16): Normal LV size with mild LVH. LVEF 35-40% with mid and apical anterior/anteroseptal and apical akinesis. No evidence of LV thrombus. Grade 1 diastolic dysfunction. Mild biatrial enlargement. Normal RV size and function.  TTE (03/10/16): Moderate to severe LV dysfunction with LAD territory akinesis (LVEF 30-35%). Likely mural thrombus at LV apex. Limited evaluation of cardiac valve due to lack of parasternal windows; no significant valvular abnormality identified. Normal RV size and function.  Recent CV Pertinent Labs: Lab Results  Component Value Date   CHOL 104 05/26/2016   HDL 38 (L) 05/26/2016   LDLCALC 50 05/26/2016   TRIG 81 05/26/2016   CHOLHDL 4.0 03/09/2016   INR 2.7 05/26/2016   INR 2.31 03/19/2016   K 4.3 05/26/2016   MG 1.9 03/10/2016   BUN 16 05/26/2016   CREATININE 0.83 05/26/2016    Past medical and surgical history were reviewed and updated in EPIC.  Outpatient Encounter Prescriptions as of 06/16/2016  Medication Sig  . atorvastatin (LIPITOR) 80 MG tablet Take 1 tablet (80 mg total) by mouth daily.  . carvedilol (COREG) 3.125 MG tablet Take 1 tablet (3.125 mg total) by mouth 2 (two) times daily.  . clopidogrel (PLAVIX) 75 MG tablet Take 1 tablet (75 mg total) by mouth daily.  . famotidine (PEPCID) 20 MG tablet Take 1 tablet (20 mg total) by mouth 2 (two) times daily. (Patient taking differently: Take 20 mg by mouth 2 (two) times daily. )  . LORazepam (ATIVAN) 0.5 MG tablet TAKE 1 TABLET BY MOUTH 3 TIMES A DAY AS NEEDED FOR  ANXIETY  . losartan (COZAAR) 25 MG tablet Take 2 tablets (50 mg total) by mouth daily.  Marland Kitchen spironolactone (ALDACTONE) 25 MG tablet Take 0.5 tablets (12.5 mg total) by mouth daily.  Marland Kitchen warfarin (COUMADIN) 10 MG tablet Take as  directed by [facility]   No facility-administered encounter medications on file as of 06/16/2016.     Allergies: Patient has no known allergies.  Social History   Social History  . Marital status: Married    Spouse name: N/A  . Number of children: N/A  . Years of education: N/A   Occupational History  . Not on file.   Social History Main Topics  . Smoking status: Former Smoker    Start date: 07/03/2007  . Smokeless tobacco: Never Used     Comment: QUIT IN 2005  . Alcohol use 0.0 oz/week     Comment: 3-4 beers every other day  . Drug use: Yes    Types: Marijuana     Comment: smokes pot  . Sexual activity: Not on file   Other Topics Concern  . Not on file   Social History Narrative   Living with family, Independent at baseline    Family History  Problem Relation Age of Onset  . Emphysema Mother   . Macular degeneration Mother   . Heart attack Mother   . Heart attack Father     Review of Systems: A 12-system review of systems was performed and was negative except as noted in the HPI.  --------------------------------------------------------------------------------------------------  Physical Exam: BP 98/70 (BP Location: Left Arm, Patient Position: Sitting, Cuff Size: Normal)   Pulse (!) 59   Ht 5\' 10"  (1.778 m)   Wt 207 lb 12 oz (94.2 kg)   BMI 29.81 kg/m   General:  Overweight man, seated comfortably in the exam room. He is accompanied by his wife. LifeVest remains in place. HEENT: No conjunctival pallor or scleral icterus.  Moist mucous membranes.  OP clear. Neck: Supple without lymphadenopathy, thyromegaly, JVD, or HJR.  No carotid bruit. Lungs: Normal work of breathing.  Clear to auscultation bilaterally without wheezes or crackles. Heart: Regular rate and rhythm without murmurs, rubs, or gallops.  Non-displaced PMI. No chest wall tenderness. Abd: Bowel sounds present.  Soft, NT/ND without hepatosplenomegaly Ext: No lower extremity edema.  Radial,  PT, and DP pulses are 2+ bilaterally. Skin: Warm and dry without rash.  Lab Results  Component Value Date   WBC 7.1 03/19/2016   HGB 14.4 03/19/2016   HCT 41.4 03/19/2016   MCV 94.3 03/19/2016   PLT 212 03/19/2016    Lab Results  Component Value Date   NA 142 05/26/2016   K 4.3 05/26/2016   CL 101 05/26/2016   CO2 21 05/26/2016   BUN 16 05/26/2016   CREATININE 0.83 05/26/2016   GLUCOSE 96 05/26/2016   ALT 21 05/26/2016    Lab Results  Component Value Date   CHOL 104 05/26/2016   HDL 38 (L) 05/26/2016   LDLCALC 50 05/26/2016   TRIG 81 05/26/2016   CHOLHDL 4.0 03/09/2016    --------------------------------------------------------------------------------------------------  ASSESSMENT AND PLAN: Coronary artery disease status post recent STEMI Edward [name] appears to be doing well. He continues to have episodes of atypical chest pain, which I suspect are not due to worsening coronary insufficiency. We will add back aspirin 81 mg daily and discontinue warfarin (see details below). Edward [name] will remain on clopidogrel to complete 1 year of DAPT; ideally he will stay on aspirin and  clopidogrel indefinitely. We will refer him to cardiac rehab today.  Ischemic cardiomyopathy and chronic systolic heart failure Edward [name] appears euvolemic with NYHA class II heart failure symptoms. Unfortunately, his borderline low heart rate and blood pressure preclude escalation of his evidence-based heart failure therapy today. As his LVEF has risen above 35%, I will have him discontinue the LifeVest. No indication for ICD placement at this time.  LV apical thrombus INR is supratherapeutic today. However, repeat echo yesterday with Definity showed no evidence of residual LV thrombus. We will therefore discontinue warfarin and restart aspirin to complete at least 12 months of DAPT (ideally longer).  Hypertension Borderline low BP today. As he is asymptomatic, I will not make any medication changes  today.  Hyperlipidemia (LDL goal < 70) Patient tolerating high-intensity statin therapy well. Most recent LDL last month was at goal. We will not make any medication changes today.  Follow-up: Return to clinic in 3 months.  Nelva Bush, MD 06/17/2016 3:47 PM

## 2016-06-16 NOTE — Patient Instructions (Signed)
Medication Instructions:  Your physician has recommended you make the following change in your medication:  1- STOP Taking Coumadin. 2- START taking Aspirin 81 mg by mouth once a day.   Labwork: none  Testing/Procedures: none  Follow-Up: Your physician recommends that you schedule a follow-up appointment in: 3 MONTHS WITH DR END.  You have been referred to [facility] [phone] TO [facility].   If you need a refill on your cardiac medications before your next appointment, please call your pharmacy.

## 2016-06-17 ENCOUNTER — Encounter: Payer: Self-pay | Admitting: Internal Medicine

## 2016-06-21 ENCOUNTER — Encounter: Payer: Commercial Managed Care - PPO | Attending: Cardiovascular Disease | Admitting: *Deleted

## 2016-06-21 VITALS — Ht 70.0 in | Wt 205.3 lb

## 2016-06-21 DIAGNOSIS — Z955 Presence of coronary angioplasty implant and graft: Secondary | ICD-10-CM | POA: Insufficient documentation

## 2016-06-21 DIAGNOSIS — I252 Old myocardial infarction: Secondary | ICD-10-CM | POA: Diagnosis present

## 2016-06-21 DIAGNOSIS — I213 ST elevation (STEMI) myocardial infarction of unspecified site: Secondary | ICD-10-CM

## 2016-06-21 NOTE — Progress Notes (Signed)
Daily Session Note  Patient Details  Name: Edward [name] MRN: [id number] Date of Birth: 07/24/1950 Referring Provider:    Encounter Date: 06/21/2016  Check In:     Session Check In - 06/21/16 1253      Check-In   Location [facility]   Staff Present Heath Lark, RN, BSN, CCRP;Carroll Enterkin, RN, Levie Heritage, MA, ACSM RCEP, Exercise Physiologist   Supervising physician immediately available to respond to emergencies See telemetry face sheet for immediately available ER MD   Medication changes reported     No   Fall or balance concerns reported    No   Warm-up and Cool-down Performed as group-led instruction   Resistance Training Performed Yes   VAD Patient? No     Pain Assessment   Currently in Pain? No/denies           Exercise Prescription Changes - 06/21/16 1300      Response to Exercise   Blood Pressure (Admit) 108/62   Blood Pressure (Exercise) 134/66   Blood Pressure (Exit) 114/60   Heart Rate (Admit) 52 bpm   Heart Rate (Exercise) 101 bpm   Heart Rate (Exit) 52 bpm   Oxygen Saturation (Exercise) 99 %   Rating of Perceived Exertion (Exercise) 10      History  Smoking Status  . Former Smoker  . Start date: 07/03/2007  Smokeless Tobacco  . Never Used    Comment: QUIT IN 2005    Goals Met:  Exercise tolerated well Personal goals reviewed No report of cardiac concerns or symptoms Strength training completed today  Goals Unmet:  Not Applicable  Comments: medical review completed   Dr. Emily Filbert is Medical Director for [facility].

## 2016-06-21 NOTE — Progress Notes (Signed)
Cardiac Individual Treatment Plan  Patient Details  Name: Edward [name] MRN: [id number] Date of Birth: 08-27-1950 Referring Provider:     Cardiac Rehab from 06/21/2016 in [facility] Cardiac and Pulmonary Rehab  Referring Provider  End, Harrell Gave MD      Initial Encounter Date:    Cardiac Rehab from 06/21/2016 in [facility] Cardiac and Pulmonary Rehab  Date  06/21/16  Referring Provider  End, Harrell Gave MD      Visit Diagnosis: ST elevation myocardial infarction (STEMI), unspecified artery [facility])  Status post coronary artery stent placement  Patient's Home Medications on Admission:  Current Outpatient Prescriptions:  .  aspirin EC 81 MG tablet, Take 1 tablet (81 mg total) by mouth daily., Disp: 90 tablet, Rfl: 3 .  atorvastatin (LIPITOR) 80 MG tablet, Take 1 tablet (80 mg total) by mouth daily., Disp: 90 tablet, Rfl: 3 .  carvedilol (COREG) 3.125 MG tablet, Take 1 tablet (3.125 mg total) by mouth 2 (two) times daily., Disp: 180 tablet, Rfl: 3 .  clopidogrel (PLAVIX) 75 MG tablet, Take 1 tablet (75 mg total) by mouth daily., Disp: 90 tablet, Rfl: 3 .  famotidine (PEPCID) 20 MG tablet, Take 1 tablet (20 mg total) by mouth 2 (two) times daily. (Patient taking differently: Take 20 mg by mouth 2 (two) times daily. ), Disp: 30 tablet, Rfl: 3 .  LORazepam (ATIVAN) 0.5 MG tablet, TAKE 1 TABLET BY MOUTH 3 TIMES A DAY AS NEEDED FOR ANXIETY, Disp: 60 tablet, Rfl: 1 .  losartan (COZAAR) 25 MG tablet, Take 2 tablets (50 mg total) by mouth daily., Disp: 180 tablet, Rfl: 3 .  spironolactone (ALDACTONE) 25 MG tablet, Take 0.5 tablets (12.5 mg total) by mouth daily., Disp: 45 tablet, Rfl: 3  Past Medical History: Past Medical History:  Diagnosis Date  . Anxiety   . Apical mural thrombus   . BPH (benign prostatic hyperplasia)   . CHF (congestive heart failure) ([place])   . Coronary artery disease   . Hyperlipidemia   . Hypertension     Tobacco Use: History  Smoking Status  . Former Smoker  . Start  date: 07/03/2007  Smokeless Tobacco  . Never Used    Comment: QUIT IN 2005    Labs: Recent Review Flowsheet Data    Labs for ITP Cardiac and Pulmonary Rehab Latest Ref Rng & Units 12/10/2014 12/16/2015 03/09/2016 03/09/2016 05/26/2016   Cholestrol 100 - 199 mg/dL 162 179 184 187 104   LDLCALC 0 - 99 mg/dL 99 120(H) 114(H) 124(H) 50   HDL >39 mg/dL 41 41 48 47 38(L)   Trlycerides 0 - 149 mg/dL 111 88 111 79 81   Hemoglobin A1c 4.8 - 5.6 % - - - 5.5 -       Exercise Target Goals: Date: 06/21/16  Exercise Program Goal: Individual exercise prescription set with THRR, safety & activity barriers. Participant demonstrates ability to understand and report RPE using BORG scale, to self-measure pulse accurately, and to acknowledge the importance of the exercise prescription.  Exercise Prescription Goal: Starting with aerobic activity 30 plus minutes a day, 3 days per week for initial exercise prescription. Provide home exercise prescription and guidelines that participant acknowledges understanding prior to discharge.  Activity Barriers & Risk Stratification:     Activity Barriers & Cardiac Risk Stratification - 06/21/16 1303      Activity Barriers & Cardiac Risk Stratification   Activity Barriers Deconditioning;Muscular Weakness   Cardiac Risk Stratification High      6 Minute Walk:  [place] Name 06/21/16 1443         6 Minute Walk   Phase Initial     Distance 1375 feet     Walk Time 6 minutes     # of Rest Breaks 0     MPH 2.6     METS 3.31     RPE 10     VO2 Peak 11.6     Symptoms No     Resting HR 52 bpm     Resting BP 108/62  r arm 106/64     Max Ex. HR 101 bpm     Max Ex. BP 134/66     2 Minute Post BP 114/60        Oxygen Initial Assessment:   Oxygen Re-Evaluation:   Oxygen Discharge (Final Oxygen Re-Evaluation):   Initial Exercise Prescription:     Initial Exercise Prescription - 06/21/16 1400      Date of Initial Exercise RX and  Referring Provider   Date 06/21/16   Referring Provider End, Harrell Gave MD     Treadmill   MPH 2.6   Grade 0.5   Minutes 15   METs 3.17     REL-XR   Level 2   Speed 50   Minutes 15   METs 2     T5 Nustep   Level 3   SPM 80   Minutes 15   METs 2     Prescription Details   Frequency (times per week) 2   Duration Progress to 45 minutes of aerobic exercise without signs/symptoms of physical distress     Intensity   THRR 40-80% of Max Heartrate 94-135   Ratings of Perceived Exertion 11-13   Perceived Dyspnea 0-4     Progression   Progression Continue to progress workloads to maintain intensity without signs/symptoms of physical distress.     Resistance Training   Training Prescription Yes   Weight 3 lbs   Reps 10-15      Perform Capillary Blood Glucose checks as needed.  Exercise Prescription Changes:     Exercise Prescription Changes    Row Name 06/21/16 1300             Response to Exercise   Blood Pressure (Admit) 108/62       Blood Pressure (Exercise) 134/66       Blood Pressure (Exit) 114/60       Heart Rate (Admit) 52 bpm       Heart Rate (Exercise) 101 bpm       Heart Rate (Exit) 52 bpm       Oxygen Saturation (Exercise) 99 %       Rating of Perceived Exertion (Exercise) 10       Symptoms none       Comments walk test results          Exercise Comments:   Exercise Goals and Review:     Exercise Goals    Row Name 06/21/16 1446             Exercise Goals   Increase Physical Activity Yes       Intervention Provide advice, education, support and counseling about physical activity/exercise needs.;Develop an individualized exercise prescription for aerobic and resistive training based on initial evaluation findings, risk stratification, comorbidities and participant's personal goals.       Expected Outcomes Achievement of increased cardiorespiratory fitness and enhanced flexibility, muscular endurance and strength shown through  measurements  of functional capacity and personal statement of participant.       Increase Strength and Stamina Yes       Intervention Provide advice, education, support and counseling about physical activity/exercise needs.;Develop an individualized exercise prescription for aerobic and resistive training based on initial evaluation findings, risk stratification, comorbidities and participant's personal goals.       Expected Outcomes Achievement of increased cardiorespiratory fitness and enhanced flexibility, muscular endurance and strength shown through measurements of functional capacity and personal statement of participant.          Exercise Goals Re-Evaluation :   Discharge Exercise Prescription (Final Exercise Prescription Changes):     Exercise Prescription Changes - 06/21/16 1300      Response to Exercise   Blood Pressure (Admit) 108/62   Blood Pressure (Exercise) 134/66   Blood Pressure (Exit) 114/60   Heart Rate (Admit) 52 bpm   Heart Rate (Exercise) 101 bpm   Heart Rate (Exit) 52 bpm   Oxygen Saturation (Exercise) 99 %   Rating of Perceived Exertion (Exercise) 10   Symptoms none   Comments walk test results      Nutrition:  Target Goals: Understanding of nutrition guidelines, daily intake of sodium 1500mg , cholesterol 200mg , calories 30% from fat and 7% or less from saturated fats, daily to have 5 or more servings of fruits and vegetables.  Biometrics:     Pre Biometrics - 06/21/16 1446      Pre Biometrics   Height 5\' 10"  (1.778 m)   Weight 205 lb 4.8 oz (93.1 kg)   Waist Circumference 41 inches   Hip Circumference 42 inches   Waist to Hip Ratio 0.98 %   BMI (Calculated) 29.5   Single Leg Stand 15.39 seconds       Nutrition Therapy Plan and Nutrition Goals:     Nutrition Therapy & Goals - 06/21/16 1310      Intervention Plan   Intervention Prescribe, educate and counsel regarding individualized specific dietary modifications aiming towards targeted  core components such as weight, hypertension, lipid management, diabetes, heart failure and other comorbidities.   Expected Outcomes Short Term Goal: Understand basic principles of dietary content, such as calories, fat, sodium, cholesterol and nutrients.;Short Term Goal: A plan has been developed with personal nutrition goals set during dietitian appointment.;Long Term Goal: Adherence to prescribed nutrition plan.      Nutrition Discharge: Rate Your Plate Scores:     Nutrition Assessments - 06/21/16 1310      MEDFICTS Scores   Pre Score 40      Nutrition Goals Re-Evaluation:   Nutrition Goals Discharge (Final Nutrition Goals Re-Evaluation):   Psychosocial: Target Goals: Acknowledge presence or absence of significant depression and/or stress, maximize coping skills, provide positive support system. Participant is able to verbalize types and ability to use techniques and skills needed for reducing stress and depression.   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 Never knows what income will be every month.      Quality of Life Scores:      Quality of Life - 06/21/16 1343      Quality of Life Scores   Health/Function Pre 21.17 %   Socioeconomic Pre 24.21 %   Psych/Spiritual Pre 22.93 %  Family Pre 16.38 %   GLOBAL Pre 21.61 %      PHQ-9: Recent Review Flowsheet Data    Depression screen [facility] 2/9 06/21/2016 12/16/2015   Decreased Interest 0 0   Down, Depressed, Hopeless 0 0   PHQ - 2 Score 0 0   Altered sleeping 1  -   Tired, decreased energy 1 -   Change in appetite 0 -   Feeling bad or failure about yourself  0 -   Trouble concentrating 0 -   Moving slowly or fidgety/restless 0 -   Suicidal thoughts 0 -   PHQ-9 Score 2 -      Interpretation of Total Score  Total Score Depression Severity:  1-4 = Minimal depression, 5-9 = Mild depression, 10-14 = Moderate depression, 15-19 = Moderately severe depression, 20-27 = Severe depression   Psychosocial Evaluation and Intervention:   Psychosocial Re-Evaluation:   Psychosocial Discharge (Final Psychosocial Re-Evaluation):   Vocational Rehabilitation: Provide vocational rehab assistance to qualifying candidates.   Vocational Rehab Evaluation & Intervention:     Vocational Rehab - 06/21/16 1347      Initial Vocational Rehab Evaluation & Intervention   Assessment shows need for Vocational Rehabilitation No      Education: Education Goals: Education classes will be provided on a weekly basis, covering required topics. Participant will state understanding/return demonstration of topics presented.  Learning Barriers/Preferences:     Learning Barriers/Preferences - 06/21/16 1346      Learning Barriers/Preferences   Learning Barriers None   Learning Preferences None      Education Topics: General Nutrition Guidelines/Fats and Fiber: -Group instruction provided by verbal, written material, models and posters to present the general guidelines for heart healthy nutrition. Gives an explanation and review of dietary fats and fiber.   Controlling Sodium/Reading Food Labels: -Group verbal and written material supporting the discussion of sodium use in heart healthy nutrition. Review and explanation with models, verbal and written materials for utilization of the food label.   Exercise Physiology & Risk Factors: - Group verbal and written instruction with models to review the exercise physiology of the cardiovascular system and associated critical values. Details cardiovascular disease risk factors and the goals associated with each risk factor.   Aerobic Exercise & Resistance Training: - Gives group verbal and written discussion on the health impact of  inactivity. On the components of aerobic and resistive training programs and the benefits of this training and how to safely progress through these programs.   Flexibility, Balance, General Exercise Guidelines: - Provides group verbal and written instruction on the benefits of flexibility and balance training programs. Provides general exercise guidelines with specific guidelines to those with heart or lung disease. Demonstration and skill practice provided.   Stress Management: - Provides group verbal and written instruction about the health risks of elevated stress, cause of high stress, and healthy ways to reduce stress.   Depression: - Provides group verbal and written instruction on the correlation between heart/lung disease and depressed mood, treatment options, and the stigmas associated with seeking treatment.   Anatomy & Physiology of the Heart: - Group verbal and written instruction and models provide basic cardiac anatomy and physiology, with the coronary electrical and arterial systems. Review of: AMI, Angina, Valve disease, Heart Failure, Cardiac Arrhythmia, Pacemakers, and the ICD.   Cardiac Procedures: - Group verbal and written instruction and models to describe the testing methods done to diagnose heart disease. Reviews the outcomes of the test results. Describes the treatment  choices: Medical Management, Angioplasty, or Coronary Bypass Surgery.   Cardiac Medications: - Group verbal and written instruction to review commonly prescribed medications for heart disease. Reviews the medication, class of the drug, and side effects. Includes the steps to properly store meds and maintain the prescription regimen.   Go Sex-Intimacy & Heart Disease, Get SMART - Goal Setting: - Group verbal and written instruction through game format to discuss heart disease and the return to sexual intimacy. Provides group verbal and written material to discuss and apply goal setting through the  application of the S.M.A.R.T. Method.   Other Matters of the Heart: - Provides group verbal, written materials and models to describe Heart Failure, Angina, Valve Disease, and Diabetes in the realm of heart disease. Includes description of the disease process and treatment options available to the cardiac patient.   Exercise & Equipment Safety: - Individual verbal instruction and demonstration of equipment use and safety with use of the equipment.   Cardiac Rehab from 06/21/2016 in [facility] Cardiac and Pulmonary Rehab  Date  06/21/16  Educator  SB  Instruction Review Code  2- meets goals/outcomes      Infection Prevention: - Provides verbal and written material to individual with discussion of infection control including proper hand washing and proper equipment cleaning during exercise session.   Cardiac Rehab from 06/21/2016 in [facility] Cardiac and Pulmonary Rehab  Date  06/21/16  Educator  SB  Instruction Review Code  2- meets goals/outcomes      Falls Prevention: - Provides verbal and written material to individual with discussion of falls prevention and safety.   Cardiac Rehab from 06/21/2016 in [facility] Cardiac and Pulmonary Rehab  Date  06/21/16  Educator  SB  Instruction Review Code  2- meets goals/outcomes      Diabetes: - Individual verbal and written instruction to review signs/symptoms of diabetes, desired ranges of glucose level fasting, after meals and with exercise. Advice that pre and post exercise glucose checks will be done for 3 sessions at entry of program.    Knowledge Questionnaire Score:     Knowledge Questionnaire Score - 06/21/16 1347      Knowledge Questionnaire Score   Pre Score 21/28      Core Components/Risk Factors/Patient Goals at Admission:     Personal Goals and Risk Factors at Admission - 06/21/16 1340      Core Components/Risk Factors/Patient Goals on Admission    Weight Management Yes;Weight Loss   Intervention Weight Management: Develop a  combined nutrition and exercise program designed to reach desired caloric intake, while maintaining appropriate intake of nutrient and fiber, sodium and fats, and appropriate energy expenditure required for the weight goal.;Weight Management: Provide education and appropriate resources to help participant work on and attain dietary goals.   Admit Weight 205 lb 4.8 oz (93.1 kg)   Goal Weight: Short Term 200 lb (90.7 kg)   Goal Weight: Long Term 176 lb (79.8 kg)   Expected Outcomes Short Term: Continue to assess and modify interventions until short term weight is achieved;Long Term: Adherence to nutrition and physical activity/exercise program aimed toward attainment of established weight goal;Weight Loss: Understanding of general recommendations for a balanced deficit meal plan, which promotes 1-2 lb weight loss per week and includes a negative energy balance of [phone] kcal/d;Understanding recommendations for meals to include 15-35% energy as protein, 25-35% energy from fat, 35-60% energy from carbohydrates, less than 200mg  of dietary cholesterol, 20-35 gm of total fiber daily;Understanding of distribution of calorie intake throughout  the day with the consumption of 4-5 meals/snacks   Tobacco Cessation --  quit 2009   Hypertension Yes   Intervention Monitor prescription use compliance.;Provide education on lifestyle modifcations including regular physical activity/exercise, weight management, moderate sodium restriction and increased consumption of fresh fruit, vegetables, and low fat dairy, alcohol moderation, and smoking cessation.   Expected Outcomes Short Term: Continued assessment and intervention until BP is < 140/49mm HG in hypertensive participants. < 130/31mm HG in hypertensive participants with diabetes, heart failure or chronic kidney disease.;Long Term: Maintenance of blood pressure at goal levels.   Lipids Yes   Intervention Provide education and support for participant on nutrition &  aerobic/resistive exercise along with prescribed medications to achieve LDL 70mg , HDL >40mg .   Expected Outcomes Short Term: Participant states understanding of desired cholesterol values and is compliant with medications prescribed. Participant is following exercise prescription and nutrition guidelines.;Long Term: Cholesterol controlled with medications as prescribed, with individualized exercise RX and with personalized nutrition plan. Value goals: LDL < 70mg , HDL > 40 mg.   Stress Yes  Mom lives next door. Stressful when he goes to visit her. Had his heart attack at her house.   Intervention Offer individual and/or small group education and counseling on adjustment to heart disease, stress management and health-related lifestyle change. Teach and support self-help strategies.;Refer participants experiencing significant psychosocial distress to appropriate mental health specialists for further evaluation and treatment. When possible, include family members and significant others in education/counseling sessions.   Expected Outcomes Short Term: Participant demonstrates changes in health-related behavior, relaxation and other stress management skills, ability to obtain effective social support, and compliance with psychotropic medications if prescribed.;Long Term: Emotional wellbeing is indicated by absence of clinically significant psychosocial distress or social isolation.      Core Components/Risk Factors/Patient Goals Review:    Core Components/Risk Factors/Patient Goals at Discharge (Final Review):    ITP Comments:     ITP Comments    Row Name 06/21/16 1349           ITP Comments Medical review completed. Initial ITP created. Documentaion of diagnosis can be found [facility] 03/29/2016  admission          Comments: Initial ITP

## 2016-06-21 NOTE — Patient Instructions (Signed)
Patient Instructions  Patient Details  Name: Edward [name] MRN: [id number] Date of Birth: [birth year] Referring Provider:  Minna Merritts, MD  Below are the personal goals you chose as well as exercise and nutrition goals. Our goal is to help you keep on track towards obtaining and maintaining your goals. We will be discussing your progress on these goals with you throughout the program.  Initial Exercise Prescription:     Initial Exercise Prescription - 06/21/16 1400      Date of Initial Exercise RX and Referring Provider   Date 06/21/16   Referring Provider End, Harrell Gave MD     Treadmill   MPH 2.6   Grade 0.5   Minutes 15   METs 3.17     REL-XR   Level 2   Speed 50   Minutes 15   METs 2     T5 Nustep   Level 3   SPM 80   Minutes 15   METs 2     Prescription Details   Frequency (times per week) 2   Duration Progress to 45 minutes of aerobic exercise without signs/symptoms of physical distress     Intensity   THRR 40-80% of Max Heartrate 94-135   Ratings of Perceived Exertion 11-13   Perceived Dyspnea 0-4     Progression   Progression Continue to progress workloads to maintain intensity without signs/symptoms of physical distress.     Resistance Training   Training Prescription Yes   Weight 3 lbs   Reps 10-15      Exercise Goals: Frequency: Be able to perform aerobic exercise three times per week working toward 3-5 days per week.  Intensity: Work with a perceived exertion of 11 (fairly light) - 15 (hard) as tolerated. Follow your new exercise prescription and watch for changes in prescription as you progress with the program. Changes will be reviewed with you when they are made.  Duration: You should be able to do 30 minutes of continuous aerobic exercise in addition to a 5 minute warm-up and a 5 minute cool-down routine.  Nutrition Goals: Your personal nutrition goals will be established when you do your nutrition analysis with the dietician.  The  following are nutrition guidelines to follow: Cholesterol < 200mg /day Sodium < 1500mg /day Fiber: Men over 50 yrs - 30 grams per day  Personal Goals:     Personal Goals and Risk Factors at Admission - 06/21/16 1340      Core Components/Risk Factors/Patient Goals on Admission    Weight Management Yes;Weight Loss   Intervention Weight Management: Develop a combined nutrition and exercise program designed to reach desired caloric intake, while maintaining appropriate intake of nutrient and fiber, sodium and fats, and appropriate energy expenditure required for the weight goal.;Weight Management: Provide education and appropriate resources to help participant work on and attain dietary goals.   Admit Weight 205 lb 4.8 oz (93.1 kg)   Goal Weight: Short Term 200 lb (90.7 kg)   Goal Weight: Long Term 176 lb (79.8 kg)   Expected Outcomes Short Term: Continue to assess and modify interventions until short term weight is achieved;Long Term: Adherence to nutrition and physical activity/exercise program aimed toward attainment of established weight goal;Weight Loss: Understanding of general recommendations for a balanced deficit meal plan, which promotes 1-2 lb weight loss per week and includes a negative energy balance of [phone] kcal/d;Understanding recommendations for meals to include 15-35% energy as protein, 25-35% energy from fat, 35-60% energy from carbohydrates, less than 200mg  of  dietary cholesterol, 20-35 gm of total fiber daily;Understanding of distribution of calorie intake throughout the day with the consumption of 4-5 meals/snacks   Tobacco Cessation --  quit 2009   Hypertension Yes   Intervention Monitor prescription use compliance.;Provide education on lifestyle modifcations including regular physical activity/exercise, weight management, moderate sodium restriction and increased consumption of fresh fruit, vegetables, and low fat dairy, alcohol moderation, and smoking cessation.   Expected  Outcomes Short Term: Continued assessment and intervention until BP is < 140/61mm HG in hypertensive participants. < 130/15mm HG in hypertensive participants with diabetes, heart failure or chronic kidney disease.;Long Term: Maintenance of blood pressure at goal levels.   Lipids Yes   Intervention Provide education and support for participant on nutrition & aerobic/resistive exercise along with prescribed medications to achieve LDL 70mg , HDL >40mg .   Expected Outcomes Short Term: Participant states understanding of desired cholesterol values and is compliant with medications prescribed. Participant is following exercise prescription and nutrition guidelines.;Long Term: Cholesterol controlled with medications as prescribed, with individualized exercise RX and with personalized nutrition plan. Value goals: LDL < 70mg , HDL > 40 mg.   Stress Yes  Mom lives next door. Stressful when he goes to visit her. Had his heart attack at her house.   Intervention Offer individual and/or small group education and counseling on adjustment to heart disease, stress management and health-related lifestyle change. Teach and support self-help strategies.;Refer participants experiencing significant psychosocial distress to appropriate mental health specialists for further evaluation and treatment. When possible, include family members and significant others in education/counseling sessions.   Expected Outcomes Short Term: Participant demonstrates changes in health-related behavior, relaxation and other stress management skills, ability to obtain effective social support, and compliance with psychotropic medications if prescribed.;Long Term: Emotional wellbeing is indicated by absence of clinically significant psychosocial distress or social isolation.      Tobacco Use Initial Evaluation: History  Smoking Status  . Former Smoker  . Start date: 07/03/2007  Smokeless Tobacco  . Never Used    Comment: QUIT IN 2005    Copy  of goals given to participant.

## 2016-06-22 LAB — LIPID PANEL W/O CHOL/HDL RATIO
Cholesterol, Total: 104 mg/dL (ref 100–199)
HDL: 38 mg/dL — AB (ref >39)
LDL CALC: 50 mg/dL (ref 0–99)
TRIGLYCERIDES: 81 mg/dL (ref 0–149)
VLDL CHOLESTEROL CAL: 16 mg/dL (ref 5–40)

## 2016-06-22 LAB — SPECIMEN STATUS REPORT

## 2016-06-23 ENCOUNTER — Encounter: Payer: Self-pay | Admitting: *Deleted

## 2016-06-23 DIAGNOSIS — Z955 Presence of coronary angioplasty implant and graft: Secondary | ICD-10-CM

## 2016-06-23 DIAGNOSIS — I213 ST elevation (STEMI) myocardial infarction of unspecified site: Secondary | ICD-10-CM

## 2016-06-23 NOTE — Progress Notes (Signed)
Cardiac Individual Treatment Plan  Patient Details  Name: Edward [name] MRN: [id number] Date of Birth: 08/07/1950 Referring Provider:     Cardiac Rehab from 06/21/2016 in [facility] Cardiac and Pulmonary Rehab  Referring Provider  End, Harrell Gave MD      Initial Encounter Date:    Cardiac Rehab from 06/21/2016 in [facility] Cardiac and Pulmonary Rehab  Date  06/21/16  Referring Provider  End, Harrell Gave MD      Visit Diagnosis: ST elevation myocardial infarction (STEMI), unspecified artery [facility])  Status post coronary artery stent placement  Patient's Home Medications on Admission:  Current Outpatient Prescriptions:  .  aspirin EC 81 MG tablet, Take 1 tablet (81 mg total) by mouth daily., Disp: 90 tablet, Rfl: 3 .  atorvastatin (LIPITOR) 80 MG tablet, Take 1 tablet (80 mg total) by mouth daily., Disp: 90 tablet, Rfl: 3 .  carvedilol (COREG) 3.125 MG tablet, Take 1 tablet (3.125 mg total) by mouth 2 (two) times daily., Disp: 180 tablet, Rfl: 3 .  clopidogrel (PLAVIX) 75 MG tablet, Take 1 tablet (75 mg total) by mouth daily., Disp: 90 tablet, Rfl: 3 .  famotidine (PEPCID) 20 MG tablet, Take 1 tablet (20 mg total) by mouth 2 (two) times daily. (Patient taking differently: Take 20 mg by mouth 2 (two) times daily. ), Disp: 30 tablet, Rfl: 3 .  LORazepam (ATIVAN) 0.5 MG tablet, TAKE 1 TABLET BY MOUTH 3 TIMES A DAY AS NEEDED FOR ANXIETY, Disp: 60 tablet, Rfl: 1 .  losartan (COZAAR) 25 MG tablet, Take 2 tablets (50 mg total) by mouth daily., Disp: 180 tablet, Rfl: 3 .  spironolactone (ALDACTONE) 25 MG tablet, Take 0.5 tablets (12.5 mg total) by mouth daily., Disp: 45 tablet, Rfl: 3  Past Medical History: Past Medical History:  Diagnosis Date  . Anxiety   . Apical mural thrombus   . BPH (benign prostatic hyperplasia)   . CHF (congestive heart failure) ([place])   . Coronary artery disease   . Hyperlipidemia   . Hypertension     Tobacco Use: History  Smoking Status  . Former Smoker  . Start  date: 07/03/2007  Smokeless Tobacco  . Never Used    Comment: QUIT IN 2005    Labs: Recent Review Flowsheet Data    Labs for ITP Cardiac and Pulmonary Rehab Latest Ref Rng & Units 12/10/2014 12/16/2015 03/09/2016 03/09/2016 05/26/2016   Cholestrol 100 - 199 mg/dL 162 179 184 187 104   LDLCALC 0 - 99 mg/dL 99 120(H) 114(H) 124(H) 50   HDL >39 mg/dL 41 41 48 47 38(L)   Trlycerides 0 - 149 mg/dL 111 88 111 79 81   Hemoglobin A1c 4.8 - 5.6 % - - - 5.5 -       Exercise Target Goals:    Exercise Program Goal: Individual exercise prescription set with THRR, safety & activity barriers. Participant demonstrates ability to understand and report RPE using BORG scale, to self-measure pulse accurately, and to acknowledge the importance of the exercise prescription.  Exercise Prescription Goal: Starting with aerobic activity 30 plus minutes a day, 3 days per week for initial exercise prescription. Provide home exercise prescription and guidelines that participant acknowledges understanding prior to discharge.  Activity Barriers & Risk Stratification:     Activity Barriers & Cardiac Risk Stratification - 06/21/16 1303      Activity Barriers & Cardiac Risk Stratification   Activity Barriers Deconditioning;Muscular Weakness   Cardiac Risk Stratification High      6 Minute Walk:  [place] Name 06/21/16 1443         6 Minute Walk   Phase Initial     Distance 1375 feet     Walk Time 6 minutes     # of Rest Breaks 0     MPH 2.6     METS 3.31     RPE 10     VO2 Peak 11.6     Symptoms No     Resting HR 52 bpm     Resting BP 108/62  r arm 106/64     Max Ex. HR 101 bpm     Max Ex. BP 134/66     2 Minute Post BP 114/60        Oxygen Initial Assessment:   Oxygen Re-Evaluation:   Oxygen Discharge (Final Oxygen Re-Evaluation):   Initial Exercise Prescription:     Initial Exercise Prescription - 06/21/16 1400      Date of Initial Exercise RX and Referring  Provider   Date 06/21/16   Referring Provider End, Harrell Gave MD     Treadmill   MPH 2.6   Grade 0.5   Minutes 15   METs 3.17     REL-XR   Level 2   Speed 50   Minutes 15   METs 2     T5 Nustep   Level 3   SPM 80   Minutes 15   METs 2     Prescription Details   Frequency (times per week) 2   Duration Progress to 45 minutes of aerobic exercise without signs/symptoms of physical distress     Intensity   THRR 40-80% of Max Heartrate 94-135   Ratings of Perceived Exertion 11-13   Perceived Dyspnea 0-4     Progression   Progression Continue to progress workloads to maintain intensity without signs/symptoms of physical distress.     Resistance Training   Training Prescription Yes   Weight 3 lbs   Reps 10-15      Perform Capillary Blood Glucose checks as needed.  Exercise Prescription Changes:     Exercise Prescription Changes    Row Name 06/21/16 1300             Response to Exercise   Blood Pressure (Admit) 108/62       Blood Pressure (Exercise) 134/66       Blood Pressure (Exit) 114/60       Heart Rate (Admit) 52 bpm       Heart Rate (Exercise) 101 bpm       Heart Rate (Exit) 52 bpm       Oxygen Saturation (Exercise) 99 %       Rating of Perceived Exertion (Exercise) 10       Symptoms none       Comments walk test results          Exercise Comments:   Exercise Goals and Review:     Exercise Goals    Row Name 06/21/16 1446             Exercise Goals   Increase Physical Activity Yes       Intervention Provide advice, education, support and counseling about physical activity/exercise needs.;Develop an individualized exercise prescription for aerobic and resistive training based on initial evaluation findings, risk stratification, comorbidities and participant's personal goals.       Expected Outcomes Achievement of increased cardiorespiratory fitness and enhanced flexibility, muscular endurance and strength shown through measurements of  functional capacity and personal statement of participant.       Increase Strength and Stamina Yes       Intervention Provide advice, education, support and counseling about physical activity/exercise needs.;Develop an individualized exercise prescription for aerobic and resistive training based on initial evaluation findings, risk stratification, comorbidities and participant's personal goals.       Expected Outcomes Achievement of increased cardiorespiratory fitness and enhanced flexibility, muscular endurance and strength shown through measurements of functional capacity and personal statement of participant.          Exercise Goals Re-Evaluation :   Discharge Exercise Prescription (Final Exercise Prescription Changes):     Exercise Prescription Changes - 06/21/16 1300      Response to Exercise   Blood Pressure (Admit) 108/62   Blood Pressure (Exercise) 134/66   Blood Pressure (Exit) 114/60   Heart Rate (Admit) 52 bpm   Heart Rate (Exercise) 101 bpm   Heart Rate (Exit) 52 bpm   Oxygen Saturation (Exercise) 99 %   Rating of Perceived Exertion (Exercise) 10   Symptoms none   Comments walk test results      Nutrition:  Target Goals: Understanding of nutrition guidelines, daily intake of sodium 1500mg , cholesterol 200mg , calories 30% from fat and 7% or less from saturated fats, daily to have 5 or more servings of fruits and vegetables.  Biometrics:     Pre Biometrics - 06/21/16 1446      Pre Biometrics   Height 5\' 10"  (1.778 m)   Weight 205 lb 4.8 oz (93.1 kg)   Waist Circumference 41 inches   Hip Circumference 42 inches   Waist to Hip Ratio 0.98 %   BMI (Calculated) 29.5   Single Leg Stand 15.39 seconds       Nutrition Therapy Plan and Nutrition Goals:     Nutrition Therapy & Goals - 06/21/16 1310      Intervention Plan   Intervention Prescribe, educate and counsel regarding individualized specific dietary modifications aiming towards targeted core components  such as weight, hypertension, lipid management, diabetes, heart failure and other comorbidities.   Expected Outcomes Short Term Goal: Understand basic principles of dietary content, such as calories, fat, sodium, cholesterol and nutrients.;Short Term Goal: A plan has been developed with personal nutrition goals set during dietitian appointment.;Long Term Goal: Adherence to prescribed nutrition plan.      Nutrition Discharge: Rate Your Plate Scores:     Nutrition Assessments - 06/21/16 1310      MEDFICTS Scores   Pre Score 40      Nutrition Goals Re-Evaluation:   Nutrition Goals Discharge (Final Nutrition Goals Re-Evaluation):   Psychosocial: Target Goals: Acknowledge presence or absence of significant depression and/or stress, maximize coping skills, provide positive support system. Participant is able to verbalize types and ability to use techniques and skills needed for reducing stress and depression.   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 Never knows what income will be every month.      Quality of Life Scores:      Quality of Life - 06/21/16 1343      Quality of Life Scores   Health/Function Pre 21.17 %   Socioeconomic Pre 24.21 %   Psych/Spiritual Pre 22.93 %  Family Pre 16.38 %   GLOBAL Pre 21.61 %      PHQ-9: Recent Review Flowsheet Data    Depression screen [facility] 2/9 06/21/2016 12/16/2015   Decreased Interest 0 0   Down, Depressed, Hopeless 0 0   PHQ - 2 Score 0 0   Altered sleeping 1  -   Tired, decreased energy 1 -   Change in appetite 0 -   Feeling bad or failure about yourself  0 -   Trouble concentrating 0 -   Moving slowly or fidgety/restless 0 -   Suicidal thoughts 0 -   PHQ-9 Score 2 -     Interpretation of  Total Score  Total Score Depression Severity:  1-4 = Minimal depression, 5-9 = Mild depression, 10-14 = Moderate depression, 15-19 = Moderately severe depression, 20-27 = Severe depression   Psychosocial Evaluation and Intervention:   Psychosocial Re-Evaluation:   Psychosocial Discharge (Final Psychosocial Re-Evaluation):   Vocational Rehabilitation: Provide vocational rehab assistance to qualifying candidates.   Vocational Rehab Evaluation & Intervention:     Vocational Rehab - 06/21/16 1347      Initial Vocational Rehab Evaluation & Intervention   Assessment shows need for Vocational Rehabilitation No      Education: Education Goals: Education classes will be provided on a weekly basis, covering required topics. Participant will state understanding/return demonstration of topics presented.  Learning Barriers/Preferences:     Learning Barriers/Preferences - 06/21/16 1346      Learning Barriers/Preferences   Learning Barriers None   Learning Preferences None      Education Topics: General Nutrition Guidelines/Fats and Fiber: -Group instruction provided by verbal, written material, models and posters to present the general guidelines for heart healthy nutrition. Gives an explanation and review of dietary fats and fiber.   Controlling Sodium/Reading Food Labels: -Group verbal and written material supporting the discussion of sodium use in heart healthy nutrition. Review and explanation with models, verbal and written materials for utilization of the food label.   Exercise Physiology & Risk Factors: - Group verbal and written instruction with models to review the exercise physiology of the cardiovascular system and associated critical values. Details cardiovascular disease risk factors and the goals associated with each risk factor.   Aerobic Exercise & Resistance Training: - Gives group verbal and written discussion on the health impact of inactivity. On the  components of aerobic and resistive training programs and the benefits of this training and how to safely progress through these programs.   Flexibility, Balance, General Exercise Guidelines: - Provides group verbal and written instruction on the benefits of flexibility and balance training programs. Provides general exercise guidelines with specific guidelines to those with heart or lung disease. Demonstration and skill practice provided.   Stress Management: - Provides group verbal and written instruction about the health risks of elevated stress, cause of high stress, and healthy ways to reduce stress.   Depression: - Provides group verbal and written instruction on the correlation between heart/lung disease and depressed mood, treatment options, and the stigmas associated with seeking treatment.   Anatomy & Physiology of the Heart: - Group verbal and written instruction and models provide basic cardiac anatomy and physiology, with the coronary electrical and arterial systems. Review of: AMI, Angina, Valve disease, Heart Failure, Cardiac Arrhythmia, Pacemakers, and the ICD.   Cardiac Procedures: - Group verbal and written instruction and models to describe the testing methods done to diagnose heart disease. Reviews the outcomes of the test results. Describes the treatment  choices: Medical Management, Angioplasty, or Coronary Bypass Surgery.   Cardiac Medications: - Group verbal and written instruction to review commonly prescribed medications for heart disease. Reviews the medication, class of the drug, and side effects. Includes the steps to properly store meds and maintain the prescription regimen.   Go Sex-Intimacy & Heart Disease, Get SMART - Goal Setting: - Group verbal and written instruction through game format to discuss heart disease and the return to sexual intimacy. Provides group verbal and written material to discuss and apply goal setting through the application of the  S.M.A.R.T. Method.   Other Matters of the Heart: - Provides group verbal, written materials and models to describe Heart Failure, Angina, Valve Disease, and Diabetes in the realm of heart disease. Includes description of the disease process and treatment options available to the cardiac patient.   Exercise & Equipment Safety: - Individual verbal instruction and demonstration of equipment use and safety with use of the equipment.   Cardiac Rehab from 06/21/2016 in [facility] Cardiac and Pulmonary Rehab  Date  06/21/16  Educator  SB  Instruction Review Code  2- meets goals/outcomes      Infection Prevention: - Provides verbal and written material to individual with discussion of infection control including proper hand washing and proper equipment cleaning during exercise session.   Cardiac Rehab from 06/21/2016 in [facility] Cardiac and Pulmonary Rehab  Date  06/21/16  Educator  SB  Instruction Review Code  2- meets goals/outcomes      Falls Prevention: - Provides verbal and written material to individual with discussion of falls prevention and safety.   Cardiac Rehab from 06/21/2016 in [facility] Cardiac and Pulmonary Rehab  Date  06/21/16  Educator  SB  Instruction Review Code  2- meets goals/outcomes      Diabetes: - Individual verbal and written instruction to review signs/symptoms of diabetes, desired ranges of glucose level fasting, after meals and with exercise. Advice that pre and post exercise glucose checks will be done for 3 sessions at entry of program.    Knowledge Questionnaire Score:     Knowledge Questionnaire Score - 06/21/16 1347      Knowledge Questionnaire Score   Pre Score 21/28      Core Components/Risk Factors/Patient Goals at Admission:     Personal Goals and Risk Factors at Admission - 06/21/16 1340      Core Components/Risk Factors/Patient Goals on Admission    Weight Management Yes;Weight Loss   Intervention Weight Management: Develop a combined nutrition  and exercise program designed to reach desired caloric intake, while maintaining appropriate intake of nutrient and fiber, sodium and fats, and appropriate energy expenditure required for the weight goal.;Weight Management: Provide education and appropriate resources to help participant work on and attain dietary goals.   Admit Weight 205 lb 4.8 oz (93.1 kg)   Goal Weight: Short Term 200 lb (90.7 kg)   Goal Weight: Long Term 176 lb (79.8 kg)   Expected Outcomes Short Term: Continue to assess and modify interventions until short term weight is achieved;Long Term: Adherence to nutrition and physical activity/exercise program aimed toward attainment of established weight goal;Weight Loss: Understanding of general recommendations for a balanced deficit meal plan, which promotes 1-2 lb weight loss per week and includes a negative energy balance of [phone] kcal/d;Understanding recommendations for meals to include 15-35% energy as protein, 25-35% energy from fat, 35-60% energy from carbohydrates, less than 200mg  of dietary cholesterol, 20-35 gm of total fiber daily;Understanding of distribution of calorie intake throughout  the day with the consumption of 4-5 meals/snacks   Tobacco Cessation --  quit 2009   Hypertension Yes   Intervention Monitor prescription use compliance.;Provide education on lifestyle modifcations including regular physical activity/exercise, weight management, moderate sodium restriction and increased consumption of fresh fruit, vegetables, and low fat dairy, alcohol moderation, and smoking cessation.   Expected Outcomes Short Term: Continued assessment and intervention until BP is < 140/81mm HG in hypertensive participants. < 130/24mm HG in hypertensive participants with diabetes, heart failure or chronic kidney disease.;Long Term: Maintenance of blood pressure at goal levels.   Lipids Yes   Intervention Provide education and support for participant on nutrition & aerobic/resistive  exercise along with prescribed medications to achieve LDL 70mg , HDL >40mg .   Expected Outcomes Short Term: Participant states understanding of desired cholesterol values and is compliant with medications prescribed. Participant is following exercise prescription and nutrition guidelines.;Long Term: Cholesterol controlled with medications as prescribed, with individualized exercise RX and with personalized nutrition plan. Value goals: LDL < 70mg , HDL > 40 mg.   Stress Yes  Mom lives next door. Stressful when he goes to visit her. Had his heart attack at her house.   Intervention Offer individual and/or small group education and counseling on adjustment to heart disease, stress management and health-related lifestyle change. Teach and support self-help strategies.;Refer participants experiencing significant psychosocial distress to appropriate mental health specialists for further evaluation and treatment. When possible, include family members and significant others in education/counseling sessions.   Expected Outcomes Short Term: Participant demonstrates changes in health-related behavior, relaxation and other stress management skills, ability to obtain effective social support, and compliance with psychotropic medications if prescribed.;Long Term: Emotional wellbeing is indicated by absence of clinically significant psychosocial distress or social isolation.      Core Components/Risk Factors/Patient Goals Review:    Core Components/Risk Factors/Patient Goals at Discharge (Final Review):    ITP Comments:     ITP Comments    Row Name 06/21/16 1349 06/23/16 0853         ITP Comments Medical review completed. Initial ITP created. Documentaion of diagnosis can be found [facility] 03/29/2016  admission 30 day review. Continue with ITP unless directed changes per Medical Director review  New to program         Comments:

## 2016-06-24 ENCOUNTER — Ambulatory Visit: Payer: Commercial Managed Care - PPO

## 2016-06-24 ENCOUNTER — Encounter: Payer: Commercial Managed Care - PPO | Admitting: *Deleted

## 2016-06-24 DIAGNOSIS — I252 Old myocardial infarction: Secondary | ICD-10-CM | POA: Diagnosis not present

## 2016-06-24 DIAGNOSIS — I213 ST elevation (STEMI) myocardial infarction of unspecified site: Secondary | ICD-10-CM

## 2016-06-24 DIAGNOSIS — Z955 Presence of coronary angioplasty implant and graft: Secondary | ICD-10-CM

## 2016-06-24 NOTE — Progress Notes (Signed)
Daily Session Note  Patient Details  Name: Edward [name] MRN: [id number] Date of Birth: Jul 29, 1950 Referring Provider:     Cardiac Rehab from 06/21/2016 in [facility] Cardiac and Pulmonary Rehab  Referring Provider  End, Harrell Gave MD      Encounter Date: 06/24/2016  Check In:     Session Check In - 06/24/16 0904      Check-In   Location [facility]-Cardiac & Pulmonary Rehab   Staff Present Nada Maclachlan, BA, ACSM CEP, Exercise Physiologist;Luiza Carranco Luan Pulling, MA, ACSM RCEP, Exercise Physiologist;Meredith Sherryll Burger, RN BSN   Supervising physician immediately available to respond to emergencies See telemetry face sheet for immediately available ER MD   Medication changes reported     No   Fall or balance concerns reported    No   Warm-up and Cool-down Performed on first and last piece of equipment   Resistance Training Performed Yes   VAD Patient? No     Pain Assessment   Currently in Pain? No/denies   Multiple Pain Sites No         History  Smoking Status  . Former Smoker  . Start date: 07/03/2007  Smokeless Tobacco  . Never Used    Comment: QUIT IN 2005    Goals Met:  Exercise tolerated well Personal goals reviewed No report of cardiac concerns or symptoms Strength training completed today  Goals Unmet:  Not Applicable  Comments: First full day of exercise!  Patient was oriented to gym and equipment including functions, settings, policies, and procedures.  Patient's individual exercise prescription and treatment plan were reviewed.  All starting workloads were established based on the results of the 6 minute walk test done at initial orientation visit.  The plan for exercise progression was also introduced and progression will be customized based on patient's performance and goals.    Dr. Emily Filbert is Medical Director for [place] and [facility].

## 2016-06-29 ENCOUNTER — Ambulatory Visit: Payer: Commercial Managed Care - PPO

## 2016-06-29 ENCOUNTER — Encounter: Payer: Commercial Managed Care - PPO | Admitting: Respiratory Therapy

## 2016-06-29 DIAGNOSIS — Z955 Presence of coronary angioplasty implant and graft: Secondary | ICD-10-CM

## 2016-06-29 DIAGNOSIS — I213 ST elevation (STEMI) myocardial infarction of unspecified site: Secondary | ICD-10-CM

## 2016-06-29 DIAGNOSIS — I252 Old myocardial infarction: Secondary | ICD-10-CM | POA: Diagnosis not present

## 2016-06-29 NOTE — Progress Notes (Signed)
Daily Session Note ° °Patient Details  °Name: Edward [name] °MRN: [id number] °Date of Birth: 07/02/1950 °Referring Provider:   °  Cardiac Rehab from 06/21/2016 in [facility] Cardiac and Pulmonary Rehab  °Referring Provider  End, Christopher MD  °  ° ° °Encounter Date: 06/29/2016 ° °Check In: °  °  °Session Check In - 06/29/16 0919   °  ° Check-In  ° Location [facility]-Cardiac & Pulmonary Rehab  ° Staff Present Jessica Hawkins, MA, ACSM RCEP, Exercise Physiologist;Krista Spencer, RN BSN;Laureen Brown, BS, RRT, Respiratory Therapist  ° Supervising physician immediately available to respond to emergencies See telemetry face sheet for immediately available ER MD  ° Medication changes reported     No  ° Fall or balance concerns reported    No  ° Warm-up and Cool-down Performed on first and last piece of equipment  ° Resistance Training Performed Yes  ° VAD Patient? No  °  ° Pain Assessment  ° Currently in Pain? No/denies  ° Multiple Pain Sites No  °  ° ° ° ° ° °History  °Smoking Status  °• Former Smoker  °• Start date: 07/03/2007  °Smokeless Tobacco  °• Never Used  °  Comment: QUIT IN 2005  ° ° °Goals Met:  °Proper associated with RPD/PD & O2 Sat °Exercise tolerated well °No report of cardiac concerns or symptoms °Strength training completed today ° °Goals Unmet:  °Not Applicable ° °Comments: Pt able to follow exercise prescription today without complaint.  Will continue to monitor for progression. ° ° °Dr. Mark Miller is Medical Director for [facility] and [facility]. °

## 2016-06-30 ENCOUNTER — Ambulatory Visit: Payer: Commercial Managed Care - PPO

## 2016-07-01 ENCOUNTER — Ambulatory Visit: Payer: Commercial Managed Care - PPO

## 2016-07-01 ENCOUNTER — Encounter: Payer: Commercial Managed Care - PPO | Admitting: *Deleted

## 2016-07-01 ENCOUNTER — Telehealth: Payer: Self-pay | Admitting: *Deleted

## 2016-07-01 DIAGNOSIS — Z955 Presence of coronary angioplasty implant and graft: Secondary | ICD-10-CM

## 2016-07-01 DIAGNOSIS — I213 ST elevation (STEMI) myocardial infarction of unspecified site: Secondary | ICD-10-CM

## 2016-07-01 DIAGNOSIS — I252 Old myocardial infarction: Secondary | ICD-10-CM | POA: Diagnosis not present

## 2016-07-01 MED ORDER — NITROGLYCERIN 0.4 MG SL SUBL: 0.4000 mg | SUBLINGUAL_TABLET | SUBLINGUAL | 2 refills | Status: DC | PRN

## 2016-07-01 NOTE — Telephone Encounter (Signed)
S/w patient regarding Nitroglycerin prescription. Patient stated he was not having any issues or chest pain. Let patient know that Dr End said we could send in a prescription for [facility] for him to use as needed. Patient verbalized understanding to dissolve under his tongue every 5 minutes for a maximum of 3 doses and if chest pain does not resolve to call 911. Patient very appreciative of the phone call but made it clear he was feeling fine. Rx sent to verified pharmacy.

## 2016-07-01 NOTE — Progress Notes (Signed)
Daily Session Note  Patient Details  Name: WILLAIM MODE MRN: [id number] Date of Birth: [birth year] Referring Provider:     Cardiac Rehab from 06/21/2016 in [facility] Cardiac and Pulmonary Rehab  Referring Provider  End, Harrell Gave MD      Encounter Date: 07/01/2016  Check In:     Session Check In - 07/01/16 0845      Check-In   Location [facility]-Cardiac & Pulmonary Rehab   Staff Present Alberteen Sam, MA, ACSM RCEP, Exercise Physiologist;Amanda Oletta Darter, BA, ACSM CEP, Exercise Physiologist;Reinhard Schack Frederico Hamman, RN BSN;Meredith Sherryll Burger, RN BSN   Supervising physician immediately available to respond to emergencies See telemetry face sheet for immediately available ER MD   Medication changes reported     No   Fall or balance concerns reported    No   Tobacco Cessation No Change   Warm-up and Cool-down Performed on first and last piece of equipment   Resistance Training Performed Yes   VAD Patient? No     Pain Assessment   Currently in Pain? No/denies   Multiple Pain Sites No         History  Smoking Status  . Former Smoker  . Start date: 07/03/2007  Smokeless Tobacco  . Never Used    Comment: QUIT IN 2005    Goals Met:  Independence with exercise equipment Exercise tolerated well No report of cardiac concerns or symptoms Strength training completed today  Goals Unmet:  Not Applicable  Comments: Pt able to follow exercise prescription today without complaint.  Will continue to monitor for progression. Reviewed home exercise with pt today.  Pt plans to walk at home for exercise.  Reviewed THR, pulse, RPE, sign and symptoms, and when to call 911 or MD.  Also discussed weather considerations and indoor options.  Pt voiced understanding. Pt noted that he does not have nitroglycerin for a prescription.  Note sent to MD.   Dr. Emily Filbert is Medical Director for [place] and [facility].

## 2016-07-01 NOTE — Telephone Encounter (Signed)
-----   Message from Nelva Bush, MD sent at 07/01/2016  9:20 AM EDT ----- Regarding: FW: Nitroglycerin Prescription Hi Anderson Malta,  Can you touch base with [name] to see if he ever receive nitroglycerin?. If not, can you call in a prescription for sublingual nitroglycerin to be taken as needed for chest pain. Thanks.  Gerald Stabs  ----- Message ----- From: Clotilde Dieter Sent: 07/01/2016   9:15 AM To: Nelva Bush, MD Subject: Nitroglycerin Prescription                     Dr. Saunders Revel,  I reviewed home exercise guidelines with Edward [name] today.  When we got to talking about sign/symptoms and what to do, he noted that he did not have nitroglycerin as a prescription.  He is post MI and stent.  Thanks for all you do! Alberteen Sam, MA, ACSM RCEP 07/01/2016 9:18 AM

## 2016-07-06 ENCOUNTER — Ambulatory Visit: Payer: Commercial Managed Care - PPO

## 2016-07-06 ENCOUNTER — Encounter: Payer: Commercial Managed Care - PPO | Attending: Cardiovascular Disease | Admitting: Respiratory Therapy

## 2016-07-06 ENCOUNTER — Ambulatory Visit: Payer: Commercial Managed Care - PPO | Admitting: Internal Medicine

## 2016-07-06 DIAGNOSIS — Z955 Presence of coronary angioplasty implant and graft: Secondary | ICD-10-CM

## 2016-07-06 DIAGNOSIS — I252 Old myocardial infarction: Secondary | ICD-10-CM | POA: Diagnosis present

## 2016-07-06 DIAGNOSIS — I213 ST elevation (STEMI) myocardial infarction of unspecified site: Secondary | ICD-10-CM

## 2016-07-06 NOTE — Progress Notes (Signed)
Daily Session Note  Patient Details  Name: Edward [name] MRN: [id number] Date of Birth: [birth year] Referring Provider:     Cardiac Rehab from 06/21/2016 in [facility] Cardiac and Pulmonary Rehab  Referring Provider  End, Harrell Gave MD      Encounter Date: 07/06/2016  Check In:     Session Check In - 07/06/16 0825      Check-In   Location [facility]-Cardiac & Pulmonary Rehab   Staff Present Heath Lark, RN, BSN, CCRP;Jessica Luan Pulling, MA, ACSM RCEP, Exercise Physiologist;Laureen Janell Quiet, RRT, Respiratory Therapist   Supervising physician immediately available to respond to emergencies See telemetry face sheet for immediately available ER MD   Medication changes reported     No   Fall or balance concerns reported    No   Tobacco Cessation No Change   Warm-up and Cool-down Performed on first and last piece of equipment   Resistance Training Performed Yes   VAD Patient? No     Pain Assessment   Currently in Pain? No/denies   Multiple Pain Sites No         History  Smoking Status   Former Smoker   Start date: 07/03/2007  Smokeless Tobacco   Never Used    Comment: QUIT IN 2005    Goals Met:  Proper associated with RPD/PD & O2 Sat Independence with exercise equipment Exercise tolerated well No report of cardiac concerns or symptoms Strength training completed today  Goals Unmet:  Not Applicable  Comments: Pt able to follow exercise prescription today without complaint.  Will continue to monitor for progression.   Dr. Emily Filbert is Medical Director for [place] and [facility].

## 2016-07-07 ENCOUNTER — Ambulatory Visit: Payer: Commercial Managed Care - PPO | Admitting: Internal Medicine

## 2016-07-08 ENCOUNTER — Ambulatory Visit: Payer: Commercial Managed Care - PPO

## 2016-07-08 DIAGNOSIS — I213 ST elevation (STEMI) myocardial infarction of unspecified site: Secondary | ICD-10-CM

## 2016-07-08 DIAGNOSIS — I252 Old myocardial infarction: Secondary | ICD-10-CM | POA: Diagnosis not present

## 2016-07-08 DIAGNOSIS — Z955 Presence of coronary angioplasty implant and graft: Secondary | ICD-10-CM

## 2016-07-08 NOTE — Progress Notes (Signed)
Daily Session Note  Patient Details  Name: TREYLIN BURTCH MRN: [id number] Date of Birth: 08/24/1950 Referring Provider:     Cardiac Rehab from 06/21/2016 in [facility] Cardiac and Pulmonary Rehab  Referring Provider  End, Harrell Gave MD      Encounter Date: 07/08/2016  Check In:     Session Check In - 07/08/16 0910      Check-In   Location [facility]-Cardiac & Pulmonary Rehab   Staff Present Alberteen Sam, MA, ACSM RCEP, Exercise Physiologist;Krista Frederico Hamman, RN Vickki Hearing, BA, ACSM CEP, Exercise Physiologist   Supervising physician immediately available to respond to emergencies See telemetry face sheet for immediately available ER MD   Medication changes reported     No   Fall or balance concerns reported    No   Warm-up and Cool-down Performed on first and last piece of equipment   Resistance Training Performed Yes   VAD Patient? No     Pain Assessment   Currently in Pain? No/denies         History  Smoking Status  . Former Smoker  . Start date: 07/03/2007  Smokeless Tobacco  . Never Used    Comment: QUIT IN 2005    Goals Met:  Independence with exercise equipment Exercise tolerated well No report of cardiac concerns or symptoms Strength training completed today  Goals Unmet:  Not Applicable  Comments: Pt able to follow exercise prescription today without complaint.  Will continue to monitor for progression.    Dr. Emily Filbert is Medical Director for [place] and [facility].

## 2016-07-11 ENCOUNTER — Other Ambulatory Visit: Payer: Self-pay | Admitting: Family Medicine

## 2016-07-11 DIAGNOSIS — K219 Gastro-esophageal reflux disease without esophagitis: Secondary | ICD-10-CM

## 2016-07-13 ENCOUNTER — Encounter: Payer: Commercial Managed Care - PPO | Admitting: Respiratory Therapy

## 2016-07-13 ENCOUNTER — Ambulatory Visit: Payer: Commercial Managed Care - PPO

## 2016-07-13 DIAGNOSIS — Z955 Presence of coronary angioplasty implant and graft: Secondary | ICD-10-CM

## 2016-07-13 DIAGNOSIS — I252 Old myocardial infarction: Secondary | ICD-10-CM | POA: Diagnosis not present

## 2016-07-13 DIAGNOSIS — I213 ST elevation (STEMI) myocardial infarction of unspecified site: Secondary | ICD-10-CM

## 2016-07-13 NOTE — Progress Notes (Signed)
Daily Session Note  Patient Details  Name: Edward [name] MRN: [id number] Date of Birth: [birth year] Referring Provider:     Cardiac Rehab from 06/21/2016 in [facility] Cardiac and Pulmonary Rehab  Referring Provider  End, Harrell Gave MD      Encounter Date: 07/13/2016  Check In:     Session Check In - 07/13/16 0832      Check-In   Location [facility]-Cardiac & Pulmonary Rehab   Staff Present Alberteen Sam, MA, ACSM RCEP, Exercise Physiologist;Susanne Bice, RN, BSN, CCRP;Laureen Owens Shark, BS, RRT, Respiratory Therapist;Other  Roanna Epley RN, Darcella Gasman RRT   Supervising physician immediately available to respond to emergencies See telemetry face sheet for immediately available ER MD   Medication changes reported     No   Fall or balance concerns reported    No   Warm-up and Cool-down Performed on first and last piece of equipment   Resistance Training Performed Yes   VAD Patient? No     Pain Assessment   Currently in Pain? No/denies   Multiple Pain Sites No         History  Smoking Status  . Former Smoker  . Start date: 07/03/2007  Smokeless Tobacco  . Never Used    Comment: QUIT IN 2005    Goals Met:  Exercise tolerated well No report of cardiac concerns or symptoms Strength training completed today  Goals Unmet:  Not Applicable  Comments: Pt able to follow exercise prescription today without complaint.  Will continue to monitor for progression.   Dr. Emily Filbert is Medical Director for [place] and [facility].

## 2016-07-13 NOTE — Progress Notes (Signed)
Daily Session Note  Patient Details  Name: Edward [name] MRN: [id number] Date of Birth: Sep 08, 1950 Referring Provider:     Cardiac Rehab from 06/21/2016 in [facility] Cardiac and Pulmonary Rehab  Referring Provider  End, Harrell Gave MD      Encounter Date: 07/13/2016  Check In:     Session Check In - 07/13/16 0832      Check-In   Location [facility]-Cardiac & Pulmonary Rehab   Staff Present Alberteen Sam, MA, ACSM RCEP, Exercise Physiologist;Susanne Bice, RN, BSN, CCRP;Laureen Owens Shark, BS, RRT, Respiratory Therapist;Other  Roanna Epley RN, Darcella Gasman RRT   Supervising physician immediately available to respond to emergencies See telemetry face sheet for immediately available ER MD   Medication changes reported     No   Fall or balance concerns reported    No   Warm-up and Cool-down Performed on first and last piece of equipment   Resistance Training Performed Yes   VAD Patient? No     Pain Assessment   Currently in Pain? No/denies   Multiple Pain Sites No         History  Smoking Status  . Former Smoker  . Start date: 07/03/2007  Smokeless Tobacco  . Never Used    Comment: QUIT IN 2005    Goals Met:  Independence with exercise equipment Exercise tolerated well No report of cardiac concerns or symptoms Strength training completed today  Goals Unmet:  Not Applicable  Comments: Pt able to follow exercise prescription today without complaint.  Will continue to monitor for progression. See ITP for review.   Dr. Emily Filbert is Medical Director for [place] and [facility].

## 2016-07-15 ENCOUNTER — Ambulatory Visit (INDEPENDENT_AMBULATORY_CARE_PROVIDER_SITE_OTHER): Payer: Commercial Managed Care - PPO | Admitting: Family Medicine

## 2016-07-15 ENCOUNTER — Encounter: Payer: Commercial Managed Care - PPO | Admitting: *Deleted

## 2016-07-15 ENCOUNTER — Ambulatory Visit: Payer: Commercial Managed Care - PPO

## 2016-07-15 VITALS — BP 122/82 | HR 68 | Temp 97.4°F | Resp 16 | Wt 211.0 lb

## 2016-07-15 DIAGNOSIS — J302 Other seasonal allergic rhinitis: Secondary | ICD-10-CM

## 2016-07-15 DIAGNOSIS — K219 Gastro-esophageal reflux disease without esophagitis: Secondary | ICD-10-CM | POA: Diagnosis not present

## 2016-07-15 DIAGNOSIS — Z955 Presence of coronary angioplasty implant and graft: Secondary | ICD-10-CM

## 2016-07-15 DIAGNOSIS — I213 ST elevation (STEMI) myocardial infarction of unspecified site: Secondary | ICD-10-CM

## 2016-07-15 DIAGNOSIS — I252 Old myocardial infarction: Secondary | ICD-10-CM | POA: Diagnosis not present

## 2016-07-15 NOTE — Progress Notes (Signed)
Daily Session Note  Patient Details  Name: AVYAY COGER MRN: [id number] Date of Birth: 03-17-1950 Referring Provider:     Cardiac Rehab from 06/21/2016 in [facility] Cardiac and Pulmonary Rehab  Referring Provider  End, Harrell Gave MD      Encounter Date: 07/15/2016  Check In:     Session Check In - 07/15/16 0842      Check-In   Location [facility]-Cardiac & Pulmonary Rehab   Staff Present Nada Maclachlan, BA, ACSM CEP, Exercise Physiologist;Bethann Qualley Luan Pulling, MA, ACSM RCEP, Exercise Physiologist;Meredith Sherryll Burger, RN BSN   Supervising physician immediately available to respond to emergencies See telemetry face sheet for immediately available ER MD   Medication changes reported     No   Fall or balance concerns reported    No   Warm-up and Cool-down Performed on first and last piece of equipment   Resistance Training Performed Yes   VAD Patient? No     Pain Assessment   Currently in Pain? No/denies   Multiple Pain Sites No           Exercise Prescription Changes - 07/15/16 1100      Response to Exercise   Blood Pressure (Admit) 112/64   Blood Pressure (Exercise) 142/70   Blood Pressure (Exit) 108/70   Heart Rate (Admit) 58 bpm   Heart Rate (Exercise) 104 bpm   Heart Rate (Exit) 65 bpm   Rating of Perceived Exertion (Exercise) 13   Symptoms none   Duration Continue with 45 min of aerobic exercise without signs/symptoms of physical distress.   Intensity THRR unchanged     Progression   Progression Continue to progress workloads to maintain intensity without signs/symptoms of physical distress.   Average METs 3.8     Resistance Training   Training Prescription Yes   Weight 4 lbs   Reps 10-15     Interval Training   Interval Training No     Treadmill   MPH 2.6   Grade 2.5   Minutes 15   METs 3.78     REL-XR   Level 5   Minutes 15   METs 5.1     T5 Nustep   Level 3   Minutes 15   METs 2.4     Home Exercise Plan   Plans to continue exercise at Home (comment)   walking and biking   Frequency Add 2 additional days to program exercise sessions.   Initial Home Exercises Provided 07/01/16      History  Smoking Status  . Former Smoker  . Start date: 07/03/2007  Smokeless Tobacco  . Never Used    Comment: QUIT IN 2005    Goals Met:  Independence with exercise equipment Exercise tolerated well No report of cardiac concerns or symptoms Strength training completed today  Goals Unmet:  Not Applicable  Comments: Pt able to follow exercise prescription today without complaint.  Will continue to monitor for progression.    Dr. Emily Filbert is Medical Director for [place] and [facility].

## 2016-07-15 NOTE — Progress Notes (Signed)
Edward [name]  MRN: [id number] DOB: [birth year]  Subjective:  HPI   The patient is a 66 year old male who complains of sore throat for 2 weeks.  He states that he does not have any sore throat today but had been.  He has had pink eye last week which is now cleared.  He complains of post nasal drainage and itching eyes, also. Some relief from lozenges. No fever, but, some rhinorrhea/nasal congestion.  Patient Active Problem List   Diagnosis Date Noted  . Chronic systolic heart failure ([place]) 04/07/2016  . Coronary artery disease involving native coronary artery of native heart without angina pectoris 03/25/2016  . Apical mural thrombus following MI ([place]) 03/22/2016  . Encounter for therapeutic drug monitoring 03/22/2016  . Clostridium difficile diarrhea   . Ischemic cardiomyopathy   . Smoker   . Hyperlipidemia LDL goal <70   . Encounter for anticoagulation discussion and counseling   . ST elevation myocardial infarction (STEMI) ([place]) 03/09/2016  . Anxiety 07/30/2014  . Arthritis of neck ([place]) 07/30/2014  . ED (erectile dysfunction) of organic origin 07/30/2014  . Personal history of tobacco use, presenting hazards to health 07/30/2014  . Adiposity 07/30/2014  . Delayed onset of urination 07/30/2014  . Brachial neuritis 08/26/2009  . Actinic keratoses 07/30/2008  . Essential (primary) hypertension 10/29/2005    Past Medical History:  Diagnosis Date  . Anxiety   . Apical mural thrombus   . BPH (benign prostatic hyperplasia)   . CHF (congestive heart failure) ([place])   . Coronary artery disease   . Hyperlipidemia   . Hypertension     Social History   Social History  . Marital status: Married    Spouse name: N/A  . Number of children: N/A  . Years of education: N/A   Occupational History  . Not on file.   Social History Main Topics  . Smoking status: Former Smoker    Start date: 07/03/2007  . Smokeless tobacco: Never Used     Comment: QUIT IN 2005  . Alcohol use 1.8  oz/week    3 Cans of beer per week  . Drug use: Yes    Types: Marijuana     Comment: smokes pot  . Sexual activity: Not on file   Other Topics Concern  . Not on file   Social History Narrative   Living with family, Independent at baseline    Outpatient Encounter Prescriptions as of 07/15/2016  Medication Sig  . aspirin EC 81 MG tablet Take 1 tablet (81 mg total) by mouth daily.  . clopidogrel (PLAVIX) 75 MG tablet Take 1 tablet (75 mg total) by mouth daily.  . famotidine (PEPCID) 20 MG tablet TAKE 1 TABLET (20 MG TOTAL) BY MOUTH 2 (TWO) TIMES DAILY.  Marland Kitchen LORazepam (ATIVAN) 0.5 MG tablet TAKE 1 TABLET BY MOUTH 3 TIMES A DAY AS NEEDED FOR ANXIETY  . losartan (COZAAR) 25 MG tablet Take 2 tablets (50 mg total) by mouth daily.  . nitroGLYCERIN (NITROSTAT) 0.4 MG SL tablet Place 1 tablet (0.4 mg total) under the tongue every 5 (five) minutes as needed for chest pain. Maximum of 3 doses.  Marland Kitchen atorvastatin (LIPITOR) 80 MG tablet Take 1 tablet (80 mg total) by mouth daily.  . carvedilol (COREG) 3.125 MG tablet Take 1 tablet (3.125 mg total) by mouth 2 (two) times daily.  Marland Kitchen spironolactone (ALDACTONE) 25 MG tablet Take 0.5 tablets (12.5 mg total) by mouth daily.   No facility-administered encounter medications on  file as of 07/15/2016.    No Known Allergies  Review of Systems  Constitutional: Negative for chills, fever and malaise/fatigue.  HENT: Positive for congestion, sinus pain and sore throat. Negative for ear discharge, ear pain, hearing loss, nosebleeds (blood in his nasal congestion) and tinnitus.   Eyes: Positive for discharge (none currently) and redness (none currently). Negative for blurred vision, double vision, photophobia and pain.  Respiratory: Negative for cough, hemoptysis, sputum production, shortness of breath and wheezing.   Neurological: Negative for weakness.    Objective:  BP 122/82 (BP Location: Right Arm, Patient Position: Sitting, Cuff Size: Normal)   Pulse 68   Temp  97.4 F (36.3 C) (Oral)   Resp 16   Wt 211 lb (95.7 kg)   BMI 30.28 kg/m  Wt Readings from Last 3 Encounters:  07/15/16 211 lb (95.7 kg)  06/21/16 205 lb 4.8 oz (93.1 kg)  06/16/16 207 lb 12 oz (94.2 kg)    Physical Exam  Constitutional: He is oriented to person, place, and time and well-developed, well-nourished, and in no distress.  HENT:  Head: Normocephalic.  Right Ear: External ear normal.  Left Ear: External ear normal.  Nose: Nose normal.  Mouth/Throat: Oropharynx is clear and moist.  Eyes: Conjunctivae are normal.  Neck: Neck supple.  Cardiovascular: Normal rate and regular rhythm.   Pulmonary/Chest: Effort normal and breath sounds normal.  Abdominal: Soft. Bowel sounds are normal.  Neurological: He is alert and oriented to person, place, and time.  Psychiatric: Affect and judgment normal.    Assessment and Plan :  1. Seasonal allergic rhinitis, unspecified trigger Onset over the past 2 weeks with rhinorrhea and scratchy sore throat. No fever, cough or headaches. Suspect seasonal allergies with PND. May use OTC antihistamine with nasal steroid. No sore throat, redness of throat or exudates today. Recheck prn.  2. Gastroesophageal reflux disease, esophagitis presence not specified Still having some occasional dyspepsia. May use antacid or Pepcid prn. No hematemesis or melena. No abdominal pain today. May need referral for GI evaluation or upper endoscopy if worsening. Reminded him to limit beer intake, caffeine and spicy or greasy food.

## 2016-07-20 ENCOUNTER — Encounter: Payer: Commercial Managed Care - PPO | Admitting: Respiratory Therapy

## 2016-07-20 ENCOUNTER — Ambulatory Visit: Payer: Commercial Managed Care - PPO

## 2016-07-20 DIAGNOSIS — I252 Old myocardial infarction: Secondary | ICD-10-CM | POA: Diagnosis not present

## 2016-07-20 DIAGNOSIS — Z955 Presence of coronary angioplasty implant and graft: Secondary | ICD-10-CM

## 2016-07-20 DIAGNOSIS — I213 ST elevation (STEMI) myocardial infarction of unspecified site: Secondary | ICD-10-CM

## 2016-07-20 NOTE — Progress Notes (Signed)
Daily Session Note  Patient Details  Name: JEIDEN DAUGHTRIDGE MRN: [id number] Date of Birth: [birth year] Referring Provider:     Cardiac Rehab from 06/21/2016 in [facility] Cardiac and Pulmonary Rehab  Referring Provider  End, Harrell Gave MD      Encounter Date: 07/20/2016  Check In:     Session Check In - 07/20/16 0831      Check-In   Location [facility]-Cardiac & Pulmonary Rehab   Staff Present Heath Lark, RN, BSN, CCRP;Laureen Owens Shark, BS, RRT, Respiratory Therapist;Joseph Darrin Nipper, [place], ACSM RCEP, Exercise Physiologist   Supervising physician immediately available to respond to emergencies See telemetry face sheet for immediately available ER MD   Medication changes reported     No   Fall or balance concerns reported    No   Warm-up and Cool-down Performed on first and last piece of equipment   Resistance Training Performed Yes   VAD Patient? No     Pain Assessment   Currently in Pain? No/denies   Multiple Pain Sites No         History  Smoking Status   Former Smoker   Start date: 07/03/2007  Smokeless Tobacco   Never Used    Comment: QUIT IN 2005    Goals Met:  Proper associated with RPD/PD & O2 Sat Independence with exercise equipment Exercise tolerated well No report of cardiac concerns or symptoms Strength training completed today  Goals Unmet:  Not Applicable  Comments: Pt able to follow exercise prescription today without complaint.  Will continue to monitor for progression.   Dr. Emily Filbert is Medical Director for [place] and [facility].

## 2016-07-21 ENCOUNTER — Encounter: Payer: Self-pay | Admitting: *Deleted

## 2016-07-21 DIAGNOSIS — Z955 Presence of coronary angioplasty implant and graft: Secondary | ICD-10-CM

## 2016-07-21 DIAGNOSIS — I213 ST elevation (STEMI) myocardial infarction of unspecified site: Secondary | ICD-10-CM

## 2016-07-21 NOTE — Progress Notes (Signed)
Cardiac Individual Treatment Plan  Patient Details  Name: Edward [name] MRN: [id number] Date of Birth: 07/01/1950 Referring Provider:     Cardiac Rehab from 06/21/2016 in [facility] Cardiac and Pulmonary Rehab  Referring Provider  End, Harrell Gave MD      Initial Encounter Date:    Cardiac Rehab from 06/21/2016 in [facility] Cardiac and Pulmonary Rehab  Date  06/21/16  Referring Provider  End, Harrell Gave MD      Visit Diagnosis: ST elevation myocardial infarction (STEMI), unspecified artery [facility])  Status post coronary artery stent placement  Patient's Home Medications on Admission:  Current Outpatient Prescriptions:  .  aspirin EC 81 MG tablet, Take 1 tablet (81 mg total) by mouth daily., Disp: 90 tablet, Rfl: 3 .  atorvastatin (LIPITOR) 80 MG tablet, Take 1 tablet (80 mg total) by mouth daily., Disp: 90 tablet, Rfl: 3 .  carvedilol (COREG) 3.125 MG tablet, Take 1 tablet (3.125 mg total) by mouth 2 (two) times daily., Disp: 180 tablet, Rfl: 3 .  clopidogrel (PLAVIX) 75 MG tablet, Take 1 tablet (75 mg total) by mouth daily., Disp: 90 tablet, Rfl: 3 .  famotidine (PEPCID) 20 MG tablet, TAKE 1 TABLET (20 MG TOTAL) BY MOUTH 2 (TWO) TIMES DAILY., Disp: 30 tablet, Rfl: 3 .  LORazepam (ATIVAN) 0.5 MG tablet, TAKE 1 TABLET BY MOUTH 3 TIMES A DAY AS NEEDED FOR ANXIETY, Disp: 60 tablet, Rfl: 1 .  losartan (COZAAR) 25 MG tablet, Take 2 tablets (50 mg total) by mouth daily., Disp: 180 tablet, Rfl: 3 .  nitroGLYCERIN (NITROSTAT) 0.4 MG SL tablet, Place 1 tablet (0.4 mg total) under the tongue every 5 (five) minutes as needed for chest pain. Maximum of 3 doses., Disp: 45 tablet, Rfl: 2 .  spironolactone (ALDACTONE) 25 MG tablet, Take 0.5 tablets (12.5 mg total) by mouth daily., Disp: 45 tablet, Rfl: 3  Past Medical History: Past Medical History:  Diagnosis Date  . Anxiety   . Apical mural thrombus   . BPH (benign prostatic hyperplasia)   . CHF (congestive heart failure) ([place])   . Coronary artery disease    . Hyperlipidemia   . Hypertension     Tobacco Use: History  Smoking Status  . Former Smoker  . Start date: 07/03/2007  Smokeless Tobacco  . Never Used    Comment: QUIT IN 2005    Labs: Recent Review Flowsheet Data    Labs for ITP Cardiac and Pulmonary Rehab Latest Ref Rng & Units 12/10/2014 12/16/2015 03/09/2016 03/09/2016 05/26/2016   Cholestrol 100 - 199 mg/dL 162 179 184 187 104   LDLCALC 0 - 99 mg/dL 99 120(H) 114(H) 124(H) 50   HDL >39 mg/dL 41 41 48 47 38(L)   Trlycerides 0 - 149 mg/dL 111 88 111 79 81   Hemoglobin A1c 4.8 - 5.6 % - - - 5.5 -       Exercise Target Goals:    Exercise Program Goal: Individual exercise prescription set with THRR, safety & activity barriers. Participant demonstrates ability to understand and report RPE using BORG scale, to self-measure pulse accurately, and to acknowledge the importance of the exercise prescription.  Exercise Prescription Goal: Starting with aerobic activity 30 plus minutes a day, 3 days per week for initial exercise prescription. Provide home exercise prescription and guidelines that participant acknowledges understanding prior to discharge.  Activity Barriers & Risk Stratification:     Activity Barriers & Cardiac Risk Stratification - 06/21/16 1303      Activity Barriers & Cardiac Risk  Stratification   Activity Barriers Deconditioning;Muscular Weakness   Cardiac Risk Stratification High      6 Minute Walk:     6 Minute Walk    Row Name 06/21/16 1443         6 Minute Walk   Phase Initial     Distance 1375 feet     Walk Time 6 minutes     # of Rest Breaks 0     MPH 2.6     METS 3.31     RPE 10     VO2 Peak 11.6     Symptoms No     Resting HR 52 bpm     Resting BP 108/62  r arm 106/64     Max Ex. HR 101 bpm     Max Ex. BP 134/66     2 Minute Post BP 114/60        Oxygen Initial Assessment:   Oxygen Re-Evaluation:   Oxygen Discharge (Final Oxygen Re-Evaluation):   Initial Exercise  Prescription:     Initial Exercise Prescription - 06/21/16 1400      Date of Initial Exercise RX and Referring Provider   Date 06/21/16   Referring Provider End, Harrell Gave MD     Treadmill   MPH 2.6   Grade 0.5   Minutes 15   METs 3.17     REL-XR   Level 2   Speed 50   Minutes 15   METs 2     T5 Nustep   Level 3   SPM 80   Minutes 15   METs 2     Prescription Details   Frequency (times per week) 2   Duration Progress to 45 minutes of aerobic exercise without signs/symptoms of physical distress     Intensity   THRR 40-80% of Max Heartrate 94-135   Ratings of Perceived Exertion 11-13   Perceived Dyspnea 0-4     Progression   Progression Continue to progress workloads to maintain intensity without signs/symptoms of physical distress.     Resistance Training   Training Prescription Yes   Weight 3 lbs   Reps 10-15      Perform Capillary Blood Glucose checks as needed.  Exercise Prescription Changes:     Exercise Prescription Changes    Row Name 06/21/16 1300 07/01/16 0900 07/15/16 1100         Response to Exercise   Blood Pressure (Admit) 108/62 100/54 112/64     Blood Pressure (Exercise) 134/66 126/54 142/70     Blood Pressure (Exit) 114/60 112/72 108/70     Heart Rate (Admit) 52 bpm 62 bpm 58 bpm     Heart Rate (Exercise) 101 bpm 86 bpm 104 bpm     Heart Rate (Exit) 52 bpm 57 bpm 65 bpm     Oxygen Saturation (Exercise) 99 %  -  -     Rating of Perceived Exertion (Exercise) '10 13 13     ' Symptoms none none none     Comments walk test results  -  -     Duration  - Continue with 45 min of aerobic exercise without signs/symptoms of physical distress. Continue with 45 min of aerobic exercise without signs/symptoms of physical distress.     Intensity  - THRR unchanged THRR unchanged       Progression   Progression  - Continue to progress workloads to maintain intensity without signs/symptoms of physical distress. Continue to progress workloads to  maintain intensity without  signs/symptoms of physical distress.     Average METs  - 2.83 3.8       Resistance Training   Training Prescription  - Yes Yes     Weight  - 3 lbs 4 lbs     Reps  - 10-15 10-15       Interval Training   Interval Training  - No No       Treadmill   MPH  - 2.6 2.6     Grade  - 2.5 2.5     Minutes  - 15 15     METs  - 3.78 3.78       REL-XR   Level  - 5 5     Minutes  - 15 15     METs  - 2.6 5.1       T5 Nustep   Level  - 3 3     Minutes  - 15 15     METs  - 2 2.4       Home Exercise Plan   Plans to continue exercise at  - Home (comment)  walking and biking Home (comment)  walking and biking     Frequency  - Add 2 additional days to program exercise sessions. Add 2 additional days to program exercise sessions.     Initial Home Exercises Provided  - 07/01/16 07/01/16        Exercise Comments:   Exercise Goals and Review:     Exercise Goals    Row Name 06/21/16 1446             Exercise Goals   Increase Physical Activity Yes       Intervention Provide advice, education, support and counseling about physical activity/exercise needs.;Develop an individualized exercise prescription for aerobic and resistive training based on initial evaluation findings, risk stratification, comorbidities and participant's personal goals.       Expected Outcomes Achievement of increased cardiorespiratory fitness and enhanced flexibility, muscular endurance and strength shown through measurements of functional capacity and personal statement of participant.       Increase Strength and Stamina Yes       Intervention Provide advice, education, support and counseling about physical activity/exercise needs.;Develop an individualized exercise prescription for aerobic and resistive training based on initial evaluation findings, risk stratification, comorbidities and participant's personal goals.       Expected Outcomes Achievement of increased cardiorespiratory  fitness and enhanced flexibility, muscular endurance and strength shown through measurements of functional capacity and personal statement of participant.          Exercise Goals Re-Evaluation :     Exercise Goals Re-Evaluation    Row Name 07/01/16 0913 07/01/16 1550 07/13/16 1212         Exercise Goal Re-Evaluation   Exercise Goals Review Increase Physical Activity;Increase Strenth and Stamina Increase Physical Activity;Increase Strenth and Stamina Increase Physical Activity;Increase Strenth and Stamina     Comments Reviewed home exercise with pt today.  Pt plans to walk at home for exercise.  Reviewed THR, pulse, RPE, sign and symptoms, and when to call 911 or MD.  Also discussed weather considerations and indoor options.  Pt voiced understanding. Edward [name] is off to a good start in rehab.  He has completed three full days of exercise!  He has already moved up to level 5 on the XR as he said it felt pretty easy.  We will continue to monitor his progression. Edward [name] has been doing well in rehab. He  starting to feel better.  He is stronger and has more stamina now especially while he is working.   He is doing some walking at home, about 3mn and has moved bike in front of TV to start exercises.       Expected Outcomes Short: Add in at least one day of exercise at home.  Long: Make exercise part of routine. Short: Increase workload on the treadmill and stepper.  Long: Make exercise part of routine. Short: Start to exercise more regularly at home. Long: Exercise as part of regular routine.        Discharge Exercise Prescription (Final Exercise Prescription Changes):     Exercise Prescription Changes - 07/15/16 1100      Response to Exercise   Blood Pressure (Admit) 112/64   Blood Pressure (Exercise) 142/70   Blood Pressure (Exit) 108/70   Heart Rate (Admit) 58 bpm   Heart Rate (Exercise) 104 bpm   Heart Rate (Exit) 65 bpm   Rating of Perceived Exertion (Exercise) 13   Symptoms none   Duration  Continue with 45 min of aerobic exercise without signs/symptoms of physical distress.   Intensity THRR unchanged     Progression   Progression Continue to progress workloads to maintain intensity without signs/symptoms of physical distress.   Average METs 3.8     Resistance Training   Training Prescription Yes   Weight 4 lbs   Reps 10-15     Interval Training   Interval Training No     Treadmill   MPH 2.6   Grade 2.5   Minutes 15   METs 3.78     REL-XR   Level 5   Minutes 15   METs 5.1     T5 Nustep   Level 3   Minutes 15   METs 2.4     Home Exercise Plan   Plans to continue exercise at Home (comment)  walking and biking   Frequency Add 2 additional days to program exercise sessions.   Initial Home Exercises Provided 07/01/16      Nutrition:  Target Goals: Understanding of nutrition guidelines, daily intake of sodium <1505m cholesterol <20023mcalories 30% from fat and 7% or less from saturated fats, daily to have 5 or more servings of fruits and vegetables.  Biometrics:     Pre Biometrics - 06/21/16 1446      Pre Biometrics   Height '5\' 10"'  (1.778 m)   Weight 205 lb 4.8 oz (93.1 kg)   Waist Circumference 41 inches   Hip Circumference 42 inches   Waist to Hip Ratio 0.98 %   BMI (Calculated) 29.5   Single Leg Stand 15.39 seconds       Nutrition Therapy Plan and Nutrition Goals:     Nutrition Therapy & Goals - 06/21/16 1310      Intervention Plan   Intervention Prescribe, educate and counsel regarding individualized specific dietary modifications aiming towards targeted core components such as weight, hypertension, lipid management, diabetes, heart failure and other comorbidities.   Expected Outcomes Short Term Goal: Understand basic principles of dietary content, such as calories, fat, sodium, cholesterol and nutrients.;Short Term Goal: A plan has been developed with personal nutrition goals set during dietitian appointment.;Long Term Goal: Adherence  to prescribed nutrition plan.      Nutrition Discharge: Rate Your Plate Scores:     Nutrition Assessments - 06/21/16 1310      MEDFICTS Scores   Pre Score 40      Nutrition Goals  Re-Evaluation:     Nutrition Goals Re-Evaluation    [place] Name 07/13/16 1404             Goals   Current Weight 207 lb (93.9 kg)       Comment Scheduled nutrition appointment for after class on 6/28          Nutrition Goals Discharge (Final Nutrition Goals Re-Evaluation):     Nutrition Goals Re-Evaluation - 07/13/16 1404      Goals   Current Weight 207 lb (93.9 kg)   Comment Scheduled nutrition appointment for after class on 6/28      Psychosocial: Target Goals: Acknowledge presence or absence of significant depression and/or stress, maximize coping skills, provide positive support system. Participant is able to verbalize types and ability to use techniques and skills needed for reducing stress and depression.   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 Never knows what income will be every month.      Quality of Life Scores:      Quality of Life - 06/21/16 1343      Quality of Life Scores   Health/Function Pre 21.17 %   Socioeconomic Pre 24.21 %   Psych/Spiritual Pre 22.93 %   Family Pre 16.38 %   GLOBAL Pre 21.61 %      PHQ-9: Recent Review Flowsheet Data    Depression screen [facility] 2/9 06/21/2016 12/16/2015   Decreased Interest 0 0   Down, Depressed, Hopeless 0 0   PHQ - 2 Score 0 0   Altered sleeping 1  -   Tired, decreased energy 1 -   Change in appetite 0 -   Feeling bad or failure about yourself  0 -   Trouble concentrating 0 -   Moving slowly or fidgety/restless 0 -   Suicidal thoughts 0 -   PHQ-9 Score 2 -     Interpretation of  Total Score  Total Score Depression Severity:  1-4 = Minimal depression, 5-9 = Mild depression, 10-14 = Moderate depression, 15-19 = Moderately severe depression, 20-27 = Severe depression   Psychosocial Evaluation and Intervention:     Psychosocial Evaluation - 06/24/16 0937      Psychosocial Evaluation & Interventions   Interventions Encouraged to exercise with the program and follow exercise prescription;Stress management education;Relaxation education   Comments Counselor met with Edward [name] today for initial psychosocial evaluation.  He is a 66 year old who had a heart attack several months ago and one inserted stent.  He has a spouse of 82 years and his mother lives next door.  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  He states he sleeps well (approx. 5 hours/night) and has a good appetite.  He denies a history of depression or anxiety or current symptoms.  He has stress with his 32 year old mother's health (which he implied "caused his heart attack."  His job and finances are also stressful at this time.  He has goals to complete this program and increase his stamina and strength.  Staff will follow with Edward [name] throughout the course of this program.   Expected Outcomes Edward [name] will benefit from consistent exercise to achieve his stated goals.  Counselor encouraged aiming for at least 6 hours of sleep vs. 5 to help with the healing and rehabilitation process.  Edward [name] with also benefit from meeting with the dietician and attending all the educational components of this program - particularly the stress management and relaxation classes to help with positive coping strategies.  Staff will follow with Edward [name] on these areas.    Continue Psychosocial Services  Follow up required by staff      Psychosocial Re-Evaluation:     Psychosocial Re-Evaluation    [place] Name 07/13/16 1405             Psychosocial Re-Evaluation   Current  issues with Current Stress Concerns;Current Anxiety/Panic       Comments Jonus has been doing well in rehab.  He is starting to notice how helpful it has been for gaining strength and stamina.  He continues to have panic attacks about every other day, whenever he begins to feel indigestion or gas in his chest or stomach.  He has not mentioned this to a doctor yet but does have an appointment on Thursday and plans to ask about it.  He is trying to do better when going to visit his mother.  He is trying not to let it get to him as much and still blames the stress from her as to the cause of his heart attack.  Overall he is feeling better and staying postitve. He is sleeping good as well.       Expected Outcomes Short: Talk to doctor about panic attacks.  Long: Continue to stay positive and cope with his mother.       Interventions Encouraged to attend Cardiac Rehabilitation for the exercise;Stress management education;Relaxation education       Continue Psychosocial Services  Follow up required by staff          Psychosocial Discharge (Final Psychosocial Re-Evaluation):     Psychosocial Re-Evaluation - 07/13/16 1405      Psychosocial Re-Evaluation   Current issues with Current Stress Concerns;Current Anxiety/Panic   Comments Edward [name] has been doing well in rehab.  He is starting to notice how helpful it has been for gaining strength and stamina.  He continues to have panic attacks about every other day, whenever he begins to feel indigestion or gas in his chest or stomach.  He has not mentioned this to a doctor yet but does have an appointment on Thursday and plans to ask about it.  He is trying to do better when going to visit his mother.  He is trying not to let it get to him as much and still blames the stress from her as to the cause of his heart attack.  Overall he is feeling better and staying postitve. He is sleeping good as well.   Expected Outcomes Short: Talk to doctor about panic attacks.  Long:  Continue to stay positive and cope with his mother.   Interventions Encouraged to attend Cardiac Rehabilitation for the exercise;Stress management education;Relaxation education   Continue Psychosocial Services  Follow up required by staff      Vocational Rehabilitation: Provide vocational rehab assistance to qualifying candidates.   Vocational Rehab Evaluation & Intervention:     Vocational Rehab - 06/21/16 1347      Initial Vocational Rehab Evaluation & Intervention   Assessment shows need for Vocational Rehabilitation No      Education: Education Goals: Education classes will be provided on a weekly basis, covering required topics. Participant will state understanding/return  demonstration of topics presented.  Learning Barriers/Preferences:     Learning Barriers/Preferences - 06/21/16 1346      Learning Barriers/Preferences   Learning Barriers None   Learning Preferences None      Education Topics: General Nutrition Guidelines/Fats and Fiber: -Group instruction provided by verbal, written material, models and posters to present the general guidelines for heart healthy nutrition. Gives an explanation and review of dietary fats and fiber.   Controlling Sodium/Reading Food Labels: -Group verbal and written material supporting the discussion of sodium use in heart healthy nutrition. Review and explanation with models, verbal and written materials for utilization of the food label.   Exercise Physiology & Risk Factors: - Group verbal and written instruction with models to review the exercise physiology of the cardiovascular system and associated critical values. Details cardiovascular disease risk factors and the goals associated with each risk factor.   Aerobic Exercise & Resistance Training: - Gives group verbal and written discussion on the health impact of inactivity. On the components of aerobic and resistive training programs and the benefits of this training and  how to safely progress through these programs.   Flexibility, Balance, General Exercise Guidelines: - Provides group verbal and written instruction on the benefits of flexibility and balance training programs. Provides general exercise guidelines with specific guidelines to those with heart or lung disease. Demonstration and skill practice provided.   Stress Management: - Provides group verbal and written instruction about the health risks of elevated stress, cause of high stress, and healthy ways to reduce stress.   Cardiac Rehab from 07/20/2016 in [facility] Cardiac and Pulmonary Rehab  Date  06/29/16  Educator  [facility]  Instruction Review Code  2- meets goals/outcomes      Depression: - Provides group verbal and written instruction on the correlation between heart/lung disease and depressed mood, treatment options, and the stigmas associated with seeking treatment.   Anatomy & Physiology of the Heart: - Group verbal and written instruction and models provide basic cardiac anatomy and physiology, with the coronary electrical and arterial systems. Review of: AMI, Angina, Valve disease, Heart Failure, Cardiac Arrhythmia, Pacemakers, and the ICD.   Cardiac Procedures: - Group verbal and written instruction and models to describe the testing methods done to diagnose heart disease. Reviews the outcomes of the test results. Describes the treatment choices: Medical Management, Angioplasty, or Coronary Bypass Surgery.   Cardiac Rehab from 07/20/2016 in [facility] Cardiac and Pulmonary Rehab  Date  07/06/16  Educator  SB  Instruction Review Code  2- meets goals/outcomes      Cardiac Medications: - Group verbal and written instruction to review commonly prescribed medications for heart disease. Reviews the medication, class of the drug, and side effects. Includes the steps to properly store meds and maintain the prescription regimen.   Cardiac Rehab from 07/20/2016 in [facility] Cardiac and Pulmonary Rehab  Date   07/13/16 [6/12 Part One 6/14 Part Two]  Educator  SB  Instruction Review Code  2- meets goals/outcomes      Go Sex-Intimacy & Heart Disease, Get SMART - Goal Setting: - Group verbal and written instruction through game format to discuss heart disease and the return to sexual intimacy. Provides group verbal and written material to discuss and apply goal setting through the application of the S.M.A.R.T. Method.   Cardiac Rehab from 07/20/2016 in [facility] Cardiac and Pulmonary Rehab  Date  07/06/16  Educator  SB  Instruction Review Code  2- meets goals/outcomes      Other  Matters of the Heart: - Provides group verbal, written materials and models to describe Heart Failure, Angina, Valve Disease, and Diabetes in the realm of heart disease. Includes description of the disease process and treatment options available to the cardiac patient.   Exercise & Equipment Safety: - Individual verbal instruction and demonstration of equipment use and safety with use of the equipment.   Cardiac Rehab from 07/20/2016 in [facility] Cardiac and Pulmonary Rehab  Date  06/21/16  Educator  SB  Instruction Review Code  2- meets goals/outcomes      Infection Prevention: - Provides verbal and written material to individual with discussion of infection control including proper hand washing and proper equipment cleaning during exercise session.   Cardiac Rehab from 07/20/2016 in [facility] Cardiac and Pulmonary Rehab  Date  06/21/16  Educator  SB  Instruction Review Code  2- meets goals/outcomes      Falls Prevention: - Provides verbal and written material to individual with discussion of falls prevention and safety.   Cardiac Rehab from 07/20/2016 in [facility] Cardiac and Pulmonary Rehab  Date  06/21/16  Educator  SB  Instruction Review Code  2- meets goals/outcomes      Diabetes: - Individual verbal and written instruction to review signs/symptoms of diabetes, desired ranges of glucose level fasting, after meals and with  exercise. Advice that pre and post exercise glucose checks will be done for 3 sessions at entry of program.    Knowledge Questionnaire Score:     Knowledge Questionnaire Score - 06/21/16 1347      Knowledge Questionnaire Score   Pre Score 21/28      Core Components/Risk Factors/Patient Goals at Admission:     Personal Goals and Risk Factors at Admission - 06/21/16 1340      Core Components/Risk Factors/Patient Goals on Admission    Weight Management Yes;Weight Loss   Intervention Weight Management: Develop a combined nutrition and exercise program designed to reach desired caloric intake, while maintaining appropriate intake of nutrient and fiber, sodium and fats, and appropriate energy expenditure required for the weight goal.;Weight Management: Provide education and appropriate resources to help participant work on and attain dietary goals.   Admit Weight 205 lb 4.8 oz (93.1 kg)   Goal Weight: Short Term 200 lb (90.7 kg)   Goal Weight: Long Term 176 lb (79.8 kg)   Expected Outcomes Short Term: Continue to assess and modify interventions until short term weight is achieved;Long Term: Adherence to nutrition and physical activity/exercise program aimed toward attainment of established weight goal;Weight Loss: Understanding of general recommendations for a balanced deficit meal plan, which promotes 1-2 lb weight loss per week and includes a negative energy balance of [phone] kcal/d;Understanding recommendations for meals to include 15-35% energy as protein, 25-35% energy from fat, 35-60% energy from carbohydrates, less than 251m of dietary cholesterol, 20-35 gm of total fiber daily;Understanding of distribution of calorie intake throughout the day with the consumption of 4-5 meals/snacks   Tobacco Cessation --  quit 2009   Hypertension Yes   Intervention Monitor prescription use compliance.;Provide education on lifestyle modifcations including regular physical activity/exercise, weight  management, moderate sodium restriction and increased consumption of fresh fruit, vegetables, and low fat dairy, alcohol moderation, and smoking cessation.   Expected Outcomes Short Term: Continued assessment and intervention until BP is < 140/952mHG in hypertensive participants. < 130/8073mG in hypertensive participants with diabetes, heart failure or chronic kidney disease.;Long Term: Maintenance of blood pressure at goal levels.   Lipids Yes  Intervention Provide education and support for participant on nutrition & aerobic/resistive exercise along with prescribed medications to achieve LDL <56m, HDL >481m   Expected Outcomes Short Term: Participant states understanding of desired cholesterol values and is compliant with medications prescribed. Participant is following exercise prescription and nutrition guidelines.;Long Term: Cholesterol controlled with medications as prescribed, with individualized exercise RX and with personalized nutrition plan. Value goals: LDL < 7079mHDL > 40 mg.   Stress Yes  Mom lives next door. Stressful when he goes to visit her. Had his heart attack at her house.   Intervention Offer individual and/or small group education and counseling on adjustment to heart disease, stress management and health-related lifestyle change. Teach and support self-help strategies.;Refer participants experiencing significant psychosocial distress to appropriate mental health specialists for further evaluation and treatment. When possible, include family members and significant others in education/counseling sessions.   Expected Outcomes Short Term: Participant demonstrates changes in health-related behavior, relaxation and other stress management skills, ability to obtain effective social support, and compliance with psychotropic medications if prescribed.;Long Term: Emotional wellbeing is indicated by absence of clinically significant psychosocial distress or social isolation.      Core  Components/Risk Factors/Patient Goals Review:      Goals and Risk Factor Review    Row Name 07/13/16 1312             Core Components/Risk Factors/Patient Goals Review   Personal Goals Review Weight Management/Obesity;Hypertension;Diabetes;Lipids       Review FreSantoss been doing well in rehab.  His weight has been holding steady around 205-207 lbs.  He wants to get more weight off and realizes that exercising more at home will help.  He has done well wiht his blood pressure and blood sugars.  He checks his sugars regularly, but only does his pressures while in class.  We discussed getting a cuff for home measurements.  He has a follow appointment later this week and is planning to ask about his lipids  then.       Expected Outcomes Short: Ask about a lipid recheck and start to exercise more at home.  Long:  Continue to work on weight loss.           Core Components/Risk Factors/Patient Goals at Discharge (Final Review):      Goals and Risk Factor Review - 07/13/16 1312      Core Components/Risk Factors/Patient Goals Review   Personal Goals Review Weight Management/Obesity;Hypertension;Diabetes;Lipids   Review FreLedons been doing well in rehab.  His weight has been holding steady around 205-207 lbs.  He wants to get more weight off and realizes that exercising more at home will help.  He has done well wiht his blood pressure and blood sugars.  He checks his sugars regularly, but only does his pressures while in class.  We discussed getting a cuff for home measurements.  He has a follow appointment later this week and is planning to ask about his lipids  then.   Expected Outcomes Short: Ask about a lipid recheck and start to exercise more at home.  Long:  Continue to work on weight loss.       ITP Comments:     ITP Comments    Row Name 06/21/16 1349 06/23/16 0853 06/24/16 0905 07/01/16 0915 07/13/16 1158   ITP Comments Medical review completed. Initial ITP created. Documentaion of  diagnosis can be found [facility] 03/29/2016  admission 30 day review. Continue with ITP unless directed changes per Medical Director  review  New to program First full day of exercise!  Patient was oriented to gym and equipment including functions, settings, policies, and procedures.  Patient's individual exercise prescription and treatment plan were reviewed.  All starting workloads were established based on the results of the 6 minute walk test done at initial orientation visit.  The plan for exercise progression was also introduced and progression will be customized based on patient's performance and goals. Pt noted that he does not have nitroglycerin for a prescription.  Note sent to MD. After todays medication lecture, Edward [name] had questions about heart symptoms.  He has a problem with belching and it is frequent. He asked if this may be form his heart condition.  He has been advised to call and talk to his doctor to help him determine if his symtoms are GI versus Cardiac.    [place] Name 07/21/16 0545           ITP Comments 30 day review. Continue with ITP unless directed changes per Medical Director review          Comments:

## 2016-07-22 ENCOUNTER — Telehealth: Payer: Self-pay | Admitting: Internal Medicine

## 2016-07-22 ENCOUNTER — Ambulatory Visit: Payer: Commercial Managed Care - PPO

## 2016-07-22 DIAGNOSIS — Z955 Presence of coronary angioplasty implant and graft: Secondary | ICD-10-CM

## 2016-07-22 DIAGNOSIS — I213 ST elevation (STEMI) myocardial infarction of unspecified site: Secondary | ICD-10-CM

## 2016-07-22 DIAGNOSIS — I252 Old myocardial infarction: Secondary | ICD-10-CM | POA: Diagnosis not present

## 2016-07-22 NOTE — Telephone Encounter (Signed)
Patient  Is no longer taking coumadin and wants to make sure he is done checking PT INR

## 2016-07-22 NOTE — Progress Notes (Signed)
Daily Session Note  Patient Details  Name: Edward [name] MRN: [id number] Date of Birth: 08-Oct-1950 Referring Provider:     Cardiac Rehab from 06/21/2016 in [facility] Cardiac and Pulmonary Rehab  Referring Provider  End, Harrell Gave MD      Encounter Date: 07/22/2016  Check In:     Session Check In - 07/22/16 0946      Check-In   Location [facility]-Cardiac & Pulmonary Rehab   Staff Present Justin Mend Lorre Nick, MA, ACSM RCEP, Exercise Physiologist;Amanda Oletta Darter, BA, ACSM CEP, Exercise Physiologist;Meredith Sherryll Burger, RN BSN   Supervising physician immediately available to respond to emergencies See telemetry face sheet for immediately available ER MD   Medication changes reported     No   Fall or balance concerns reported    No   Warm-up and Cool-down Performed on first and last piece of equipment   Resistance Training Performed Yes   VAD Patient? No     Pain Assessment   Currently in Pain? No/denies   Multiple Pain Sites No         History  Smoking Status  . Former Smoker  . Start date: 07/03/2007  Smokeless Tobacco  . Never Used    Comment: QUIT IN 2005    Goals Met:  Independence with exercise equipment Exercise tolerated well No report of cardiac concerns or symptoms Strength training completed today  Goals Unmet:  Not Applicable  Comments: Pt able to follow exercise prescription today without complaint.  Will continue to monitor for progression.   Dr. Emily Filbert is Medical Director for [place] and [facility].

## 2016-07-22 NOTE — Telephone Encounter (Signed)
S/w patient. Clarified with patient that he does not need any more appt's with [facility] and that his next appt is 09/22/16 at 10:20 am with Dr End. He verbalized understanding.

## 2016-07-23 ENCOUNTER — Encounter: Payer: Self-pay | Admitting: Family Medicine

## 2016-07-27 ENCOUNTER — Ambulatory Visit: Payer: Commercial Managed Care - PPO

## 2016-07-27 ENCOUNTER — Encounter: Payer: Commercial Managed Care - PPO | Admitting: *Deleted

## 2016-07-27 DIAGNOSIS — Z955 Presence of coronary angioplasty implant and graft: Secondary | ICD-10-CM

## 2016-07-27 DIAGNOSIS — I252 Old myocardial infarction: Secondary | ICD-10-CM | POA: Diagnosis not present

## 2016-07-27 DIAGNOSIS — I213 ST elevation (STEMI) myocardial infarction of unspecified site: Secondary | ICD-10-CM

## 2016-07-27 NOTE — Progress Notes (Signed)
Daily Session Note  Patient Details  Name: Edward [name] MRN: [id number] Date of Birth: [birth year] Referring Provider:     Cardiac Rehab from 06/21/2016 in [facility] Cardiac and Pulmonary Rehab  Referring Provider  End, Harrell Gave MD      Encounter Date: 07/27/2016  Check In:     Session Check In - 07/27/16 0813      Check-In   Location [facility]-Cardiac & Pulmonary Rehab   Staff Present Heath Lark, RN, BSN, CCRP;Arfa Lamarca Luan Pulling, MA, ACSM RCEP, Exercise Physiologist;Laureen Janell Quiet, RRT, Respiratory Therapist   Supervising physician immediately available to respond to emergencies See telemetry face sheet for immediately available ER MD   Medication changes reported     No   Fall or balance concerns reported    No   Warm-up and Cool-down Performed on first and last piece of equipment   Resistance Training Performed Yes   VAD Patient? No     Pain Assessment   Currently in Pain? No/denies   Multiple Pain Sites No         History  Smoking Status  . Former Smoker  . Start date: 07/03/2007  Smokeless Tobacco  . Never Used    Comment: QUIT IN 2005    Goals Met:  Independence with exercise equipment Exercise tolerated well No report of cardiac concerns or symptoms Strength training completed today  Goals Unmet:  Not Applicable  Comments: Pt able to follow exercise prescription today without complaint.  Will continue to monitor for progression. Devlyn discussed his "indigestion" with his doctor.  They encouraged him to try using Mylanta for relief.  He has not started it yet, but his symptoms have improved since last week.  RN encouraged him to discuss taking Mylanta with his pharmacist to make sure that there are not any drug interactions.   Dr. Emily Filbert is Medical Director for [place] and [facility].

## 2016-07-29 ENCOUNTER — Encounter: Payer: Commercial Managed Care - PPO | Admitting: *Deleted

## 2016-07-29 ENCOUNTER — Ambulatory Visit: Payer: Commercial Managed Care - PPO

## 2016-07-29 DIAGNOSIS — I213 ST elevation (STEMI) myocardial infarction of unspecified site: Secondary | ICD-10-CM

## 2016-07-29 DIAGNOSIS — Z955 Presence of coronary angioplasty implant and graft: Secondary | ICD-10-CM

## 2016-07-29 DIAGNOSIS — I252 Old myocardial infarction: Secondary | ICD-10-CM | POA: Diagnosis not present

## 2016-07-29 NOTE — Progress Notes (Signed)
Daily Session Note  Patient Details  Name: MATAN STEEN MRN: [id number] Date of Birth: 1950-03-24 Referring Provider:     Cardiac Rehab from 06/21/2016 in [facility] Cardiac and Pulmonary Rehab  Referring Provider  End, Harrell Gave MD      Encounter Date: 07/29/2016  Check In:     Session Check In - 07/29/16 0921      Check-In   Location [facility]-Cardiac & Pulmonary Rehab   Staff Present Nyoka Cowden, RN, BSN, MA;Meredith Sherryll Burger, RN BSN;Jessica Luan Pulling, MA, ACSM RCEP, Exercise Physiologist   Supervising physician immediately available to respond to emergencies See telemetry face sheet for immediately available ER MD   Medication changes reported     No   Fall or balance concerns reported    No   Warm-up and Cool-down Performed on first and last piece of equipment   Resistance Training Performed Yes   VAD Patient? No     Pain Assessment   Currently in Pain? No/denies         History  Smoking Status  . Former Smoker  . Start date: 07/03/2007  Smokeless Tobacco  . Never Used    Comment: QUIT IN 2005    Goals Met:  Proper associated with RPD/PD & O2 Sat Independence with exercise equipment Exercise tolerated well No report of cardiac concerns or symptoms Strength training completed today  Goals Unmet:  Not Applicable  Comments: Pt able to follow exercise prescription today without complaint.  Will continue to monitor for progression.    Dr. Emily Filbert is Medical Director for [place] and [facility].

## 2016-08-03 ENCOUNTER — Ambulatory Visit: Payer: Commercial Managed Care - PPO

## 2016-08-03 ENCOUNTER — Encounter: Payer: Commercial Managed Care - PPO | Attending: Cardiovascular Disease | Admitting: *Deleted

## 2016-08-03 DIAGNOSIS — I252 Old myocardial infarction: Secondary | ICD-10-CM | POA: Diagnosis present

## 2016-08-03 DIAGNOSIS — I213 ST elevation (STEMI) myocardial infarction of unspecified site: Secondary | ICD-10-CM

## 2016-08-03 DIAGNOSIS — Z955 Presence of coronary angioplasty implant and graft: Secondary | ICD-10-CM | POA: Insufficient documentation

## 2016-08-03 NOTE — Progress Notes (Signed)
Daily Session Note  Patient Details  Name: Edward [name] MRN: [id number] Date of Birth: 1950-06-05 Referring Provider:     Cardiac Rehab from 06/21/2016 in [facility] Cardiac and Pulmonary Rehab  Referring Provider  End, Harrell Gave MD      Encounter Date: 08/03/2016  Check In:     Session Check In - 08/03/16 9584      Check-In   Location [facility]-Cardiac & Pulmonary Rehab   Staff Present Heath Lark, RN, BSN, CCRP;Jessica Luan Pulling, MA, ACSM RCEP, Exercise Physiologist;Amanda Oletta Darter, [place], ACSM CEP, Exercise Physiologist   Supervising physician immediately available to respond to emergencies See telemetry face sheet for immediately available ER MD   Medication changes reported     No   Fall or balance concerns reported    No   Warm-up and Cool-down Performed on first and last piece of equipment   Resistance Training Performed Yes   VAD Patient? No     Pain Assessment   Currently in Pain? No/denies   Multiple Pain Sites No         History  Smoking Status  . Former Smoker  . Start date: 07/03/2007  Smokeless Tobacco  . Never Used    Comment: QUIT IN 2005    Goals Met:  Independence with exercise equipment Exercise tolerated well No report of cardiac concerns or symptoms Strength training completed today  Goals Unmet:  Not Applicable  Comments: Pt able to follow exercise prescription today without complaint.  Will continue to monitor for progression.    Dr. Emily Filbert is Medical Director for [place] and [facility].

## 2016-08-05 ENCOUNTER — Encounter: Payer: Commercial Managed Care - PPO | Admitting: *Deleted

## 2016-08-05 ENCOUNTER — Ambulatory Visit: Payer: Commercial Managed Care - PPO

## 2016-08-05 DIAGNOSIS — I213 ST elevation (STEMI) myocardial infarction of unspecified site: Secondary | ICD-10-CM

## 2016-08-05 DIAGNOSIS — I252 Old myocardial infarction: Secondary | ICD-10-CM | POA: Diagnosis not present

## 2016-08-05 DIAGNOSIS — Z955 Presence of coronary angioplasty implant and graft: Secondary | ICD-10-CM

## 2016-08-05 NOTE — Progress Notes (Signed)
Daily Session Note  Patient Details  Name: Edward [name] MRN: [id number] Date of Birth: 01-22-1951 Referring Provider:     Cardiac Rehab from 06/21/2016 in [facility] Cardiac and Pulmonary Rehab  Referring Provider  End, Harrell Gave MD      Encounter Date: 08/05/2016  Check In:     Session Check In - 08/05/16 0850      Check-In   Location [facility]-Cardiac & Pulmonary Rehab   Staff Present Alberteen Sam, MA, ACSM RCEP, Exercise Physiologist;Amanda Oletta Darter, BA, ACSM CEP, Exercise Physiologist;Meredith Sherryll Burger, RN BSN   Supervising physician immediately available to respond to emergencies See telemetry face sheet for immediately available ER MD   Medication changes reported     No   Fall or balance concerns reported    No   Warm-up and Cool-down Performed on first and last piece of equipment   Resistance Training Performed Yes   VAD Patient? No     Pain Assessment   Currently in Pain? No/denies   Multiple Pain Sites No         History  Smoking Status  . Former Smoker  . Start date: 07/03/2007  Smokeless Tobacco  . Never Used    Comment: QUIT IN 2005    Goals Met:  Independence with exercise equipment Exercise tolerated well Personal goals reviewed No report of cardiac concerns or symptoms Strength training completed today  Goals Unmet:  Not Applicable  Comments: Pt able to follow exercise prescription today without complaint.  Will continue to monitor for progression.    Dr. Emily Filbert is Medical Director for [place] and [facility].

## 2016-08-10 ENCOUNTER — Ambulatory Visit: Payer: Commercial Managed Care - PPO

## 2016-08-12 ENCOUNTER — Ambulatory Visit: Payer: Commercial Managed Care - PPO

## 2016-08-17 ENCOUNTER — Encounter: Payer: Commercial Managed Care - PPO | Admitting: *Deleted

## 2016-08-17 ENCOUNTER — Ambulatory Visit: Payer: Commercial Managed Care - PPO

## 2016-08-17 DIAGNOSIS — Z955 Presence of coronary angioplasty implant and graft: Secondary | ICD-10-CM

## 2016-08-17 DIAGNOSIS — I252 Old myocardial infarction: Secondary | ICD-10-CM | POA: Diagnosis not present

## 2016-08-17 DIAGNOSIS — I213 ST elevation (STEMI) myocardial infarction of unspecified site: Secondary | ICD-10-CM

## 2016-08-17 NOTE — Progress Notes (Signed)
Daily Session Note  Patient Details  Name: Edward [name] MRN: [id number] Date of Birth: 09/19/1950 Referring Provider:     Cardiac Rehab from 06/21/2016 in [facility] Cardiac and Pulmonary Rehab  Referring Provider  End, Harrell Gave MD      Encounter Date: 08/17/2016  Check In:     Session Check In - 08/17/16 0824      Check-In   Location [facility]-Cardiac & Pulmonary Rehab   Staff Present Alberteen Sam, MA, ACSM RCEP, Exercise Physiologist;Amanda Oletta Darter, BA, ACSM CEP, Exercise Physiologist;Susanne Bice, RN, BSN, CCRP   Supervising physician immediately available to respond to emergencies See telemetry face sheet for immediately available ER MD   Medication changes reported     No   Fall or balance concerns reported    No   Warm-up and Cool-down Performed on first and last piece of equipment   Resistance Training Performed Yes   VAD Patient? No     Pain Assessment   Currently in Pain? No/denies   Multiple Pain Sites No         History  Smoking Status  . Former Smoker  . Start date: 07/03/2007  Smokeless Tobacco  . Never Used    Comment: QUIT IN 2005    Goals Met:  Independence with exercise equipment Exercise tolerated well No report of cardiac concerns or symptoms Strength training completed today  Goals Unmet:  Not Applicable  Comments: Pt able to follow exercise prescription today without complaint.  Will continue to monitor for progression.    Dr. Emily Filbert is Medical Director for [place] and [facility].

## 2016-08-18 ENCOUNTER — Encounter: Payer: Self-pay | Admitting: *Deleted

## 2016-08-18 DIAGNOSIS — I213 ST elevation (STEMI) myocardial infarction of unspecified site: Secondary | ICD-10-CM

## 2016-08-18 DIAGNOSIS — Z955 Presence of coronary angioplasty implant and graft: Secondary | ICD-10-CM

## 2016-08-18 NOTE — Progress Notes (Signed)
Cardiac Individual Treatment Plan  Patient Details  Name: Edward [name] MRN: [id number] Date of Birth: 29-Oct-1950 Referring Provider:     Cardiac Rehab from 06/21/2016 in [facility] Cardiac and Pulmonary Rehab  Referring Provider  End, Harrell Gave MD      Initial Encounter Date:    Cardiac Rehab from 06/21/2016 in [facility] Cardiac and Pulmonary Rehab  Date  06/21/16  Referring Provider  End, Harrell Gave MD      Visit Diagnosis: ST elevation myocardial infarction (STEMI), unspecified artery [facility])  Status post coronary artery stent placement  Patient's Home Medications on Admission:  Current Outpatient Prescriptions:  .  aspirin EC 81 MG tablet, Take 1 tablet (81 mg total) by mouth daily., Disp: 90 tablet, Rfl: 3 .  atorvastatin (LIPITOR) 80 MG tablet, Take 1 tablet (80 mg total) by mouth daily., Disp: 90 tablet, Rfl: 3 .  carvedilol (COREG) 3.125 MG tablet, Take 1 tablet (3.125 mg total) by mouth 2 (two) times daily., Disp: 180 tablet, Rfl: 3 .  clopidogrel (PLAVIX) 75 MG tablet, Take 1 tablet (75 mg total) by mouth daily., Disp: 90 tablet, Rfl: 3 .  famotidine (PEPCID) 20 MG tablet, TAKE 1 TABLET (20 MG TOTAL) BY MOUTH 2 (TWO) TIMES DAILY., Disp: 30 tablet, Rfl: 3 .  LORazepam (ATIVAN) 0.5 MG tablet, TAKE 1 TABLET BY MOUTH 3 TIMES A DAY AS NEEDED FOR ANXIETY, Disp: 60 tablet, Rfl: 1 .  losartan (COZAAR) 25 MG tablet, Take 2 tablets (50 mg total) by mouth daily., Disp: 180 tablet, Rfl: 3 .  nitroGLYCERIN (NITROSTAT) 0.4 MG SL tablet, Place 1 tablet (0.4 mg total) under the tongue every 5 (five) minutes as needed for chest pain. Maximum of 3 doses., Disp: 45 tablet, Rfl: 2 .  spironolactone (ALDACTONE) 25 MG tablet, Take 0.5 tablets (12.5 mg total) by mouth daily., Disp: 45 tablet, Rfl: 3  Past Medical History: Past Medical History:  Diagnosis Date  . Anxiety   . Apical mural thrombus   . BPH (benign prostatic hyperplasia)   . CHF (congestive heart failure) ([place])   . Coronary artery disease    . Hyperlipidemia   . Hypertension     Tobacco Use: History  Smoking Status  . Former Smoker  . Start date: 07/03/2007  Smokeless Tobacco  . Never Used    Comment: QUIT IN 2005    Labs: Recent Review Flowsheet Data    Labs for ITP Cardiac and Pulmonary Rehab Latest Ref Rng & Units 12/10/2014 12/16/2015 03/09/2016 03/09/2016 05/26/2016   Cholestrol 100 - 199 mg/dL 162 179 184 187 104   LDLCALC 0 - 99 mg/dL 99 120(H) 114(H) 124(H) 50   HDL >39 mg/dL 41 41 48 47 38(L)   Trlycerides 0 - 149 mg/dL 111 88 111 79 81   Hemoglobin A1c 4.8 - 5.6 % - - - 5.5 -       Exercise Target Goals:    Exercise Program Goal: Individual exercise prescription set with THRR, safety & activity barriers. Participant demonstrates ability to understand and report RPE using BORG scale, to self-measure pulse accurately, and to acknowledge the importance of the exercise prescription.  Exercise Prescription Goal: Starting with aerobic activity 30 plus minutes a day, 3 days per week for initial exercise prescription. Provide home exercise prescription and guidelines that participant acknowledges understanding prior to discharge.  Activity Barriers & Risk Stratification:     Activity Barriers & Cardiac Risk Stratification - 06/21/16 1303      Activity Barriers & Cardiac Risk  Stratification   Activity Barriers Deconditioning;Muscular Weakness   Cardiac Risk Stratification High      6 Minute Walk:     6 Minute Walk    Row Name 06/21/16 1443         6 Minute Walk   Phase Initial     Distance 1375 feet     Walk Time 6 minutes     # of Rest Breaks 0     MPH 2.6     METS 3.31     RPE 10     VO2 Peak 11.6     Symptoms No     Resting HR 52 bpm     Resting BP 108/62  r arm 106/64     Max Ex. HR 101 bpm     Max Ex. BP 134/66     2 Minute Post BP 114/60        Oxygen Initial Assessment:   Oxygen Re-Evaluation:   Oxygen Discharge (Final Oxygen Re-Evaluation):   Initial Exercise  Prescription:     Initial Exercise Prescription - 06/21/16 1400      Date of Initial Exercise RX and Referring Provider   Date 06/21/16   Referring Provider End, Harrell Gave MD     Treadmill   MPH 2.6   Grade 0.5   Minutes 15   METs 3.17     REL-XR   Level 2   Speed 50   Minutes 15   METs 2     T5 Nustep   Level 3   SPM 80   Minutes 15   METs 2     Prescription Details   Frequency (times per week) 2   Duration Progress to 45 minutes of aerobic exercise without signs/symptoms of physical distress     Intensity   THRR 40-80% of Max Heartrate 94-135   Ratings of Perceived Exertion 11-13   Perceived Dyspnea 0-4     Progression   Progression Continue to progress workloads to maintain intensity without signs/symptoms of physical distress.     Resistance Training   Training Prescription Yes   Weight 3 lbs   Reps 10-15      Perform Capillary Blood Glucose checks as needed.  Exercise Prescription Changes:     Exercise Prescription Changes    Row Name 06/21/16 1300 07/01/16 0900 07/15/16 1100 07/27/16 1400 08/11/16 1500     Response to Exercise   Blood Pressure (Admit) 108/62 100/54 112/64 124/74 124/60   Blood Pressure (Exercise) 134/66 126/54 142/70 146/80 128/70   Blood Pressure (Exit) 114/60 112/72 108/70 130/70 120/60   Heart Rate (Admit) 52 bpm 62 bpm 58 bpm 72 bpm 72 bpm   Heart Rate (Exercise) 101 bpm 86 bpm 104 bpm 91 bpm 99 bpm   Heart Rate (Exit) 52 bpm 57 bpm 65 bpm 71 bpm 56 bpm   Oxygen Saturation (Exercise) 99 %  -  -  -  -   Rating of Perceived Exertion (Exercise) '10 13 13 13 13   ' Symptoms none none none none none   Comments walk test results  -  -  -  -   Duration  - Continue with 45 min of aerobic exercise without signs/symptoms of physical distress. Continue with 45 min of aerobic exercise without signs/symptoms of physical distress. Continue with 45 min of aerobic exercise without signs/symptoms of physical distress. Continue with 45 min of  aerobic exercise without signs/symptoms of physical distress.   Intensity  - THRR unchanged THRR unchanged  THRR unchanged THRR unchanged     Progression   Progression  - Continue to progress workloads to maintain intensity without signs/symptoms of physical distress. Continue to progress workloads to maintain intensity without signs/symptoms of physical distress. Continue to progress workloads to maintain intensity without signs/symptoms of physical distress. Continue to progress workloads to maintain intensity without signs/symptoms of physical distress.   Average METs  - 2.83 3.8 4.1 4.8     Resistance Training   Training Prescription  - Yes Yes Yes Yes   Weight  - 3 lbs 4 lbs 5 lbs 6 lb   Reps  - 10-15 10-15 10-15 10-15     Interval Training   Interval Training  - No No No No     Treadmill   MPH  - 2.6 2.6 2.6  -   Grade  - 2.5 2.5 2.5  -   Minutes  - '15 15 15  ' -   METs  - 3.78 3.78 3.78  -     REL-XR   Level  - '5 5 5 5   ' Minutes  - '15 15 15 15   ' METs  - 2.6 5.1 5.4 6.6     T5 Nustep   Level  - '3 3 3 4   ' Minutes  - '15 15 15 15   ' METs  - 2 2.'4 3 3     ' Home Exercise Plan   Plans to continue exercise at  - Home (comment)  walking and biking Home (comment)  walking and biking Home (comment)  walking and biking  -   Frequency  - Add 2 additional days to program exercise sessions. Add 2 additional days to program exercise sessions. Add 2 additional days to program exercise sessions.  -   Initial Home Exercises Provided  - 07/01/16 07/01/16 07/01/16  -      Exercise Comments:   Exercise Goals and Review:     Exercise Goals    Row Name 06/21/16 1446             Exercise Goals   Increase Physical Activity Yes       Intervention Provide advice, education, support and counseling about physical activity/exercise needs.;Develop an individualized exercise prescription for aerobic and resistive training based on initial evaluation findings, risk stratification,  comorbidities and participant's personal goals.       Expected Outcomes Achievement of increased cardiorespiratory fitness and enhanced flexibility, muscular endurance and strength shown through measurements of functional capacity and personal statement of participant.       Increase Strength and Stamina Yes       Intervention Provide advice, education, support and counseling about physical activity/exercise needs.;Develop an individualized exercise prescription for aerobic and resistive training based on initial evaluation findings, risk stratification, comorbidities and participant's personal goals.       Expected Outcomes Achievement of increased cardiorespiratory fitness and enhanced flexibility, muscular endurance and strength shown through measurements of functional capacity and personal statement of participant.          Exercise Goals Re-Evaluation :     Exercise Goals Re-Evaluation    Row Name 07/01/16 0913 07/01/16 1550 07/13/16 1212 07/27/16 1432 08/05/16 0851     Exercise Goal Re-Evaluation   Exercise Goals Review Increase Physical Activity;Increase Strenth and Stamina Increase Physical Activity;Increase Strenth and Stamina Increase Physical Activity;Increase Strenth and Stamina Increase Physical Activity;Increase Strenth and Stamina Increase Physical Activity;Increase Strenth and Stamina   Comments Reviewed home exercise with pt today.  Pt  plans to walk at home for exercise.  Reviewed THR, pulse, RPE, sign and symptoms, and when to call 911 or MD.  Also discussed weather considerations and indoor options.  Pt voiced understanding. Marcelus is off to a good start in rehab.  He has completed three full days of exercise!  He has already moved up to level 5 on the XR as he said it felt pretty easy.  We will continue to monitor his progression. Cadel has been doing well in rehab. He starting to feel better.  He is stronger and has more stamina now especially while he is working.   He is doing some  walking at home, about 78mn and has moved bike in front of TV to start exercises.   FDahircontinues to do well in rehab.  He is up 5.4 METs on the XR.  We will continue to monitor his progression. FSaahilis feeling better overall.  He is stronger and has more stamina.  He has actually started to enjoy exercsing.  Yesterday he did 3 miles on his recumbent bike in 30 minutes.  He has it set up in the living room and can watch TV while he does it.  He will be out all next week on vacation and plans to go hiking while on vacation.    Expected Outcomes Short: Add in at least one day of exercise at home.  Long: Make exercise part of routine. Short: Increase workload on the treadmill and stepper.  Long: Make exercise part of routine. Short: Start to exercise more regularly at home. Long: Exercise as part of regular routine. Short: Move up again on workloads.  Long: Exercise more at home. Short: Keep up with exercise while out of town.  Long: Conitnue to exercise independently.   RKildareName 08/11/16 1521 08/11/16 1525           Exercise Goal Re-Evaluation   Exercise Goals Review Increase Physical Activity;Increase Strenth and Stamina Increase Physical Activity;Increase Strenth and Stamina      Comments FPharaohis progressing well and has added resistance to the XR and gone up on strength training weight. FKhingis progressing well with exercise.      Expected Outcomes Short - FCarthelwill complete HeartTrack program.  Long - FJasminewill continue to exercise on his own. Short - Pt will complete HT program  Long - Pt will exercise on his own         Discharge Exercise Prescription (Final Exercise Prescription Changes):     Exercise Prescription Changes - 08/11/16 1500      Response to Exercise   Blood Pressure (Admit) 124/60   Blood Pressure (Exercise) 128/70   Blood Pressure (Exit) 120/60   Heart Rate (Admit) 72 bpm   Heart Rate (Exercise) 99 bpm   Heart Rate (Exit) 56 bpm   Rating of Perceived Exertion  (Exercise) 13   Symptoms none   Duration Continue with 45 min of aerobic exercise without signs/symptoms of physical distress.   Intensity THRR unchanged     Progression   Progression Continue to progress workloads to maintain intensity without signs/symptoms of physical distress.   Average METs 4.8     Resistance Training   Training Prescription Yes   Weight 6 lb   Reps 10-15     Interval Training   Interval Training No     REL-XR   Level 5   Minutes 15   METs 6.6     T5 Nustep   Level  4   Minutes 15   METs 3      Nutrition:  Target Goals: Understanding of nutrition guidelines, daily intake of sodium <1549m, cholesterol <2028m calories 30% from fat and 7% or less from saturated fats, daily to have 5 or more servings of fruits and vegetables.  Biometrics:     Pre Biometrics - 06/21/16 1446      Pre Biometrics   Height '5\' 10"'  (1.778 m)   Weight 205 lb 4.8 oz (93.1 kg)   Waist Circumference 41 inches   Hip Circumference 42 inches   Waist to Hip Ratio 0.98 %   BMI (Calculated) 29.5   Single Leg Stand 15.39 seconds       Nutrition Therapy Plan and Nutrition Goals:     Nutrition Therapy & Goals - 08/05/16 0854      Nutrition Therapy   RD appointment defered --      Nutrition Discharge: Rate Your Plate Scores:     Nutrition Assessments - 06/21/16 1310      MEDFICTS Scores   Pre Score 40      Nutrition Goals Re-Evaluation:     Nutrition Goals Re-Evaluation    Row Name 07/13/16 1404 08/05/16 0854           Goals   Current Weight 207 lb (93.9 kg) 200 lb (90.7 kg)      Nutrition Goal  - Use calorieking app to look up nutrition information especially for foods when dining "out".  Reading Labels, increase fruits and vegetables, Decrease salty foods      Comment Scheduled nutrition appointment for after class on 6/28 He is using the app Lose It to track exercise and nutrtion.  He is reading labels and increasing fruits and vegetables.  He said  that he is eating smart and healthy.  His weight has platued around 200lbs.  He would like to break through the barrier.      Expected Outcome  - Short: Continue to use Lose It app.  Break 200 lbs barrier.  Long: Continue to work on heart healthy diet.         Nutrition Goals Discharge (Final Nutrition Goals Re-Evaluation):     Nutrition Goals Re-Evaluation - 08/05/16 0854      Goals   Current Weight 200 lb (90.7 kg)   Nutrition Goal Use calorieking app to look up nutrition information especially for foods when dining "out".  Reading Labels, increase fruits and vegetables, Decrease salty foods   Comment He is using the app Lose It to track exercise and nutrtion.  He is reading labels and increasing fruits and vegetables.  He said that he is eating smart and healthy.  His weight has platued around 200lbs.  He would like to break through the barrier.   Expected Outcome Short: Continue to use Lose It app.  Break 200 lbs barrier.  Long: Continue to work on heart healthy diet.      Psychosocial: Target Goals: Acknowledge presence or absence of significant depression and/or stress, maximize coping skills, provide positive support system. Participant is able to verbalize types and ability to use techniques and skills needed for reducing stress and depression.   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 Never knows what  income will be every month.      Quality of Life Scores:      Quality of Life - 06/21/16 1343      Quality of Life Scores   Health/Function Pre 21.17 %   Socioeconomic Pre 24.21 %   Psych/Spiritual Pre 22.93 %   Family Pre 16.38 %   GLOBAL Pre 21.61 %      PHQ-9: Recent Review Flowsheet Data    Depression screen [facility] 2/9 06/21/2016  12/16/2015   Decreased Interest 0 0   Down, Depressed, Hopeless 0 0   PHQ - 2 Score 0 0   Altered sleeping 1  -   Tired, decreased energy 1 -   Change in appetite 0 -   Feeling bad or failure about yourself  0 -   Trouble concentrating 0 -   Moving slowly or fidgety/restless 0 -   Suicidal thoughts 0 -   PHQ-9 Score 2 -     Interpretation of Total Score  Total Score Depression Severity:  1-4 = Minimal depression, 5-9 = Mild depression, 10-14 = Moderate depression, 15-19 = Moderately severe depression, 20-27 = Severe depression   Psychosocial Evaluation and Intervention:     Psychosocial Evaluation - 06/24/16 0937      Psychosocial Evaluation & Interventions   Interventions Encouraged to exercise with the program and follow exercise prescription;Stress management education;Relaxation education   Comments Counselor met with Jennings today for initial psychosocial evaluation.  He is a 66 year old who had a heart attack several months ago and one inserted stent.  He has a spouse of 60 years and his mother lives next door.  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  He states he sleeps well (approx. 5 hours/night) and has a good appetite.  He denies a history of depression or anxiety or current symptoms.  He has stress with his 43 year old mother's health (which he implied "caused his heart attack."  His job and finances are also stressful at this time.  He has goals to complete this program and increase his stamina and strength.  Staff will follow with Josph Macho throughout the course of this program.   Expected Outcomes Justen will benefit from consistent exercise to achieve his stated goals.  Counselor encouraged aiming for at least 6 hours of sleep vs. 5 to help with the healing and rehabilitation process.  Abas with also benefit from meeting with the dietician and attending all the educational components of this program -  particularly the stress management and relaxation classes to help with positive coping strategies.  Staff will follow with Josph Macho on these areas.    Continue Psychosocial Services  Follow up required by staff      Psychosocial Re-Evaluation:     Psychosocial Re-Evaluation    [place] Name 07/13/16 1405 08/05/16 0917           Psychosocial Re-Evaluation   Current issues with Current Stress Concerns;Current Anxiety/Panic Current Stress Concerns;Current Anxiety/Panic      Comments Tyland has been doing well in rehab.  He is starting to notice how helpful it has been for gaining strength and stamina.  He continues to have panic attacks about every other day, whenever he begins to feel indigestion or gas in his chest or stomach.  He has not mentioned this to a doctor yet but does have an appointment on Thursday and plans to ask about it.  He is  trying to do better when going to visit his mother.  He is trying not to let it get to him as much and still blames the stress from her as to the cause of his heart attack.  Overall he is feeling better and staying postitve. He is sleeping good as well. Macklen continues to do well in rehab.  He is doing more at home.  He did not mention the panic attacks getting any worse since he saw his doctor and they talked about it being his stomach versus his heart.  He has been avoiding going to visit his mother, but does need to go today  to help her with her finances.  He is afraid that she is going to cause his death since he has lost his father and grandfather which he blames his mom for.  He has been sleeping better overall.  He is trying to stay positive but his anxiety causes him to look up symptoms on the internet which he tries to take lightly but it usually just add to his anxiety even more.  He is also getting ready to take a week long vacation.  He is looking forward to it, but still cause stress.      Expected Outcomes Short: Talk to doctor about panic attacks.  Long:  Continue to stay positive and cope with his mother. Short: Enjoy his vacation.  Long: Stay positive and cope with his mother.      Interventions Encouraged to attend Cardiac Rehabilitation for the exercise;Stress management education;Relaxation education Encouraged to attend Cardiac Rehabilitation for the exercise;Stress management education;Relaxation education      Continue Psychosocial Services  Follow up required by staff  -         Psychosocial Discharge (Final Psychosocial Re-Evaluation):     Psychosocial Re-Evaluation - 08/05/16 0917      Psychosocial Re-Evaluation   Current issues with Current Stress Concerns;Current Anxiety/Panic   Comments Navi continues to do well in rehab.  He is doing more at home.  He did not mention the panic attacks getting any worse since he saw his doctor and they talked about it being his stomach versus his heart.  He has been avoiding going to visit his mother, but does need to go today  to help her with her finances.  He is afraid that she is going to cause his death since he has lost his father and grandfather which he blames his mom for.  He has been sleeping better overall.  He is trying to stay positive but his anxiety causes him to look up symptoms on the internet which he tries to take lightly but it usually just add to his anxiety even more.  He is also getting ready to take a week long vacation.  He is looking forward to it, but still cause stress.   Expected Outcomes Short: Enjoy his vacation.  Long: Stay positive and cope with his mother.   Interventions Encouraged to attend Cardiac Rehabilitation for the exercise;Stress management education;Relaxation education      Vocational Rehabilitation: Provide vocational rehab assistance to qualifying candidates.   Vocational Rehab Evaluation & Intervention:     Vocational Rehab - 06/21/16 1347      Initial Vocational Rehab Evaluation & Intervention   Assessment shows need for Vocational  Rehabilitation No      Education: Education Goals: Education classes will be provided on a weekly basis, covering required topics. Participant will state understanding/return demonstration of topics presented.  Learning Barriers/Preferences:  Learning Barriers/Preferences - 06/21/16 1346      Learning Barriers/Preferences   Learning Barriers None   Learning Preferences None      Education Topics: General Nutrition Guidelines/Fats and Fiber: -Group instruction provided by verbal, written material, models and posters to present the general guidelines for heart healthy nutrition. Gives an explanation and review of dietary fats and fiber.   Cardiac Rehab from 08/17/2016 in [facility] Cardiac and Pulmonary Rehab  Date  07/27/16  Educator  CR  Instruction Review Code  2- meets goals/outcomes      Controlling Sodium/Reading Food Labels: -Group verbal and written material supporting the discussion of sodium use in heart healthy nutrition. Review and explanation with models, verbal and written materials for utilization of the food label.   Cardiac Rehab from 08/17/2016 in [facility] Cardiac and Pulmonary Rehab  Date  08/03/16  Educator  PI  Instruction Review Code  2- meets goals/outcomes      Exercise Physiology & Risk Factors: - Group verbal and written instruction with models to review the exercise physiology of the cardiovascular system and associated critical values. Details cardiovascular disease risk factors and the goals associated with each risk factor.   Aerobic Exercise & Resistance Training: - Gives group verbal and written discussion on the health impact of inactivity. On the components of aerobic and resistive training programs and the benefits of this training and how to safely progress through these programs.   Flexibility, Balance, General Exercise Guidelines: - Provides group verbal and written instruction on the benefits of flexibility and balance training programs.  Provides general exercise guidelines with specific guidelines to those with heart or lung disease. Demonstration and skill practice provided.   Cardiac Rehab from 08/17/2016 in [facility] Cardiac and Pulmonary Rehab  Date  08/17/16  Educator  AS  Instruction Review Code  2- meets goals/outcomes      Stress Management: - Provides group verbal and written instruction about the health risks of elevated stress, cause of high stress, and healthy ways to reduce stress.   Cardiac Rehab from 08/17/2016 in [facility] Cardiac and Pulmonary Rehab  Date  06/29/16  Educator  [facility]  Instruction Review Code  2- meets goals/outcomes      Depression: - Provides group verbal and written instruction on the correlation between heart/lung disease and depressed mood, treatment options, and the stigmas associated with seeking treatment.   Cardiac Rehab from 08/17/2016 in [facility] Cardiac and Pulmonary Rehab  Date  08/05/16  Educator  [facility]  Instruction Review Code  2- meets goals/outcomes      Anatomy & Physiology of the Heart: - Group verbal and written instruction and models provide basic cardiac anatomy and physiology, with the coronary electrical and arterial systems. Review of: AMI, Angina, Valve disease, Heart Failure, Cardiac Arrhythmia, Pacemakers, and the ICD.   Cardiac Procedures: - Group verbal and written instruction and models to describe the testing methods done to diagnose heart disease. Reviews the outcomes of the test results. Describes the treatment choices: Medical Management, Angioplasty, or Coronary Bypass Surgery.   Cardiac Rehab from 08/17/2016 in [facility] Cardiac and Pulmonary Rehab  Date  07/06/16  Educator  SB  Instruction Review Code  2- meets goals/outcomes      Cardiac Medications: - Group verbal and written instruction to review commonly prescribed medications for heart disease. Reviews the medication, class of the drug, and side effects. Includes the steps to properly store meds and maintain the  prescription regimen.   Cardiac Rehab from 08/17/2016 in [facility]  Cardiac and Pulmonary Rehab  Date  07/13/16 [6/12 Part One 6/14 Part Two]  Educator  SB  Instruction Review Code  2- meets goals/outcomes      Go Sex-Intimacy & Heart Disease, Get SMART - Goal Setting: - Group verbal and written instruction through game format to discuss heart disease and the return to sexual intimacy. Provides group verbal and written material to discuss and apply goal setting through the application of the S.M.A.R.T. Method.   Cardiac Rehab from 08/17/2016 in [facility] Cardiac and Pulmonary Rehab  Date  07/06/16  Educator  SB  Instruction Review Code  2- meets goals/outcomes      Other Matters of the Heart: - Provides group verbal, written materials and models to describe Heart Failure, Angina, Valve Disease, and Diabetes in the realm of heart disease. Includes description of the disease process and treatment options available to the cardiac patient.   Exercise & Equipment Safety: - Individual verbal instruction and demonstration of equipment use and safety with use of the equipment.   Cardiac Rehab from 08/17/2016 in [facility] Cardiac and Pulmonary Rehab  Date  06/21/16  Educator  SB  Instruction Review Code  2- meets goals/outcomes      Infection Prevention: - Provides verbal and written material to individual with discussion of infection control including proper hand washing and proper equipment cleaning during exercise session.   Cardiac Rehab from 08/17/2016 in [facility] Cardiac and Pulmonary Rehab  Date  06/21/16  Educator  SB  Instruction Review Code  2- meets goals/outcomes      Falls Prevention: - Provides verbal and written material to individual with discussion of falls prevention and safety.   Cardiac Rehab from 08/17/2016 in [facility] Cardiac and Pulmonary Rehab  Date  06/21/16  Educator  SB  Instruction Review Code  2- meets goals/outcomes      Diabetes: - Individual verbal and written instruction  to review signs/symptoms of diabetes, desired ranges of glucose level fasting, after meals and with exercise. Advice that pre and post exercise glucose checks will be done for 3 sessions at entry of program.    Knowledge Questionnaire Score:     Knowledge Questionnaire Score - 06/21/16 1347      Knowledge Questionnaire Score   Pre Score 21/28      Core Components/Risk Factors/Patient Goals at Admission:     Personal Goals and Risk Factors at Admission - 06/21/16 1340      Core Components/Risk Factors/Patient Goals on Admission    Weight Management Yes;Weight Loss   Intervention Weight Management: Develop a combined nutrition and exercise program designed to reach desired caloric intake, while maintaining appropriate intake of nutrient and fiber, sodium and fats, and appropriate energy expenditure required for the weight goal.;Weight Management: Provide education and appropriate resources to help participant work on and attain dietary goals.   Admit Weight 205 lb 4.8 oz (93.1 kg)   Goal Weight: Short Term 200 lb (90.7 kg)   Goal Weight: Long Term 176 lb (79.8 kg)   Expected Outcomes Short Term: Continue to assess and modify interventions until short term weight is achieved;Long Term: Adherence to nutrition and physical activity/exercise program aimed toward attainment of established weight goal;Weight Loss: Understanding of general recommendations for a balanced deficit meal plan, which promotes 1-2 lb weight loss per week and includes a negative energy balance of [phone] kcal/d;Understanding recommendations for meals to include 15-35% energy as protein, 25-35% energy from fat, 35-60% energy from carbohydrates, less than 249m of dietary cholesterol, 20-35  gm of total fiber daily;Understanding of distribution of calorie intake throughout the day with the consumption of 4-5 meals/snacks   Tobacco Cessation --  quit 2009   Hypertension Yes   Intervention Monitor prescription use  compliance.;Provide education on lifestyle modifcations including regular physical activity/exercise, weight management, moderate sodium restriction and increased consumption of fresh fruit, vegetables, and low fat dairy, alcohol moderation, and smoking cessation.   Expected Outcomes Short Term: Continued assessment and intervention until BP is < 140/11m HG in hypertensive participants. < 130/871mHG in hypertensive participants with diabetes, heart failure or chronic kidney disease.;Long Term: Maintenance of blood pressure at goal levels.   Lipids Yes   Intervention Provide education and support for participant on nutrition & aerobic/resistive exercise along with prescribed medications to achieve LDL <7062mHDL >75m39m Expected Outcomes Short Term: Participant states understanding of desired cholesterol values and is compliant with medications prescribed. Participant is following exercise prescription and nutrition guidelines.;Long Term: Cholesterol controlled with medications as prescribed, with individualized exercise RX and with personalized nutrition plan. Value goals: LDL < 70mg66mL > 40 mg.   Stress Yes  Mom lives next door. Stressful when he goes to visit her. Had his heart attack at her house.   Intervention Offer individual and/or small group education and counseling on adjustment to heart disease, stress management and health-related lifestyle change. Teach and support self-help strategies.;Refer participants experiencing significant psychosocial distress to appropriate mental health specialists for further evaluation and treatment. When possible, include family members and significant others in education/counseling sessions.   Expected Outcomes Short Term: Participant demonstrates changes in health-related behavior, relaxation and other stress management skills, ability to obtain effective social support, and compliance with psychotropic medications if prescribed.;Long Term: Emotional  wellbeing is indicated by absence of clinically significant psychosocial distress or social isolation.      Core Components/Risk Factors/Patient Goals Review:      Goals and Risk Factor Review    Row Name 07/13/16 1312 08/05/16 0910           Core Components/Risk Factors/Patient Goals Review   Personal Goals Review Weight Management/Obesity;Hypertension;Diabetes;Lipids Weight Management/Obesity;Hypertension;Diabetes;Lipids      Review Lonie Stellanbeen doing well in rehab.  His weight has been holding steady around 205-207 lbs.  He wants to get more weight off and realizes that exercising more at home will help.  He has done well wiht his blood pressure and blood sugars.  He checks his sugars regularly, but only does his pressures while in class.  We discussed getting a cuff for home measurements.  He has a follow appointment later this week and is planning to ask about his lipids  then. Brayen's weight has been holding steady around 200lbs.  He would like to break through that barrier.  He continues to do well with is sugars and pressures.  He does not check his pressure at home as even just going to check at [facility] on the automatic cuff makes him nervous. He continues to do well with his medications as well.       Expected Outcomes Short: Ask about a lipid recheck and start to exercise more at home.  Long:  Continue to work on weight loss.  Short: Break through 200 lbs barrier.  Long: Continue to work on risk factor modification         Core Components/Risk Factors/Patient Goals at Discharge (Final Review):      Goals and Risk Factor Review - 08/05/16 0910  Core Components/Risk Factors/Patient Goals Review   Personal Goals Review Weight Management/Obesity;Hypertension;Diabetes;Lipids   Review [place]'s weight has been holding steady around 200lbs.  He would like to break through that barrier.  He continues to do well with is sugars and pressures.  He does not check his pressure at home as even  just going to check at [facility] on the automatic cuff makes him nervous. He continues to do well with his medications as well.    Expected Outcomes Short: Break through 200 lbs barrier.  Long: Continue to work on risk factor modification      ITP Comments:     ITP Comments    Row Name 06/21/16 1349 06/23/16 0853 06/24/16 0905 07/01/16 0915 07/13/16 1158   ITP Comments Medical review completed. Initial ITP created. Documentaion of diagnosis can be found [facility] 03/29/2016  admission 30 day review. Continue with ITP unless directed changes per Medical Director review  New to program First full day of exercise!  Patient was oriented to gym and equipment including functions, settings, policies, and procedures.  Patient's individual exercise prescription and treatment plan were reviewed.  All starting workloads were established based on the results of the 6 minute walk test done at initial orientation visit.  The plan for exercise progression was also introduced and progression will be customized based on patient's performance and goals. Pt noted that he does not have nitroglycerin for a prescription.  Note sent to MD. After todays medication lecture, Josph Macho had questions about heart symptoms.  He has a problem with belching and it is frequent. He asked if this may be form his heart condition.  He has been advised to call and talk to his doctor to help him determine if his symtoms are GI versus Cardiac.    [place] Name 07/21/16 0545 07/27/16 1023 08/18/16 0649       ITP Comments 30 day review. Continue with ITP unless directed changes per Medical Director review [place] discussed his "indigestion" with his doctor.  They encouraged him to try using Mylanta for relief.  He has not started it yet, but his symptoms have improved since last week.  RN encouraged him to discuss taking Mylanta with his pharmacist to make sure that there are not any drug interactions. 30 day review. Continue with ITP unless directed changes per  Medical Director review           Comments:

## 2016-08-19 ENCOUNTER — Encounter: Payer: Commercial Managed Care - PPO | Admitting: *Deleted

## 2016-08-19 ENCOUNTER — Ambulatory Visit: Payer: Commercial Managed Care - PPO

## 2016-08-19 DIAGNOSIS — I213 ST elevation (STEMI) myocardial infarction of unspecified site: Secondary | ICD-10-CM

## 2016-08-19 DIAGNOSIS — Z955 Presence of coronary angioplasty implant and graft: Secondary | ICD-10-CM

## 2016-08-19 DIAGNOSIS — I252 Old myocardial infarction: Secondary | ICD-10-CM | POA: Diagnosis not present

## 2016-08-19 NOTE — Progress Notes (Signed)
Daily Session Note  Patient Details  Name: Edward [name] MRN: [id number] Date of Birth: 09-27-1950 Referring Provider:     Cardiac Rehab from 06/21/2016 in [facility] Cardiac and Pulmonary Rehab  Referring Provider  End, Harrell Gave MD      Encounter Date: 08/19/2016  Check In:     Session Check In - 08/19/16 0912      Check-In   Location [facility]-Cardiac & Pulmonary Rehab   Staff Present Alberteen Sam, MA, ACSM RCEP, Exercise Physiologist;Krista Frederico Hamman, RN Vickki Hearing, BA, ACSM CEP, Exercise Physiologist   Supervising physician immediately available to respond to emergencies See telemetry face sheet for immediately available ER MD   Medication changes reported     No   Fall or balance concerns reported    No   Warm-up and Cool-down Performed on first and last piece of equipment   Resistance Training Performed Yes   VAD Patient? No     Pain Assessment   Currently in Pain? No/denies   Multiple Pain Sites No         History  Smoking Status  . Former Smoker  . Start date: 07/03/2007  Smokeless Tobacco  . Never Used    Comment: QUIT IN 2005    Goals Met:  Independence with exercise equipment Exercise tolerated well No report of cardiac concerns or symptoms Strength training completed today  Goals Unmet:  Not Applicable  Comments: Pt able to follow exercise prescription today without complaint.  Will continue to monitor for progression.    Dr. Emily Filbert is Medical Director for [place] [facility].

## 2016-08-24 ENCOUNTER — Ambulatory Visit: Payer: Commercial Managed Care - PPO

## 2016-08-24 DIAGNOSIS — Z955 Presence of coronary angioplasty implant and graft: Secondary | ICD-10-CM

## 2016-08-24 DIAGNOSIS — I213 ST elevation (STEMI) myocardial infarction of unspecified site: Secondary | ICD-10-CM

## 2016-08-24 DIAGNOSIS — I252 Old myocardial infarction: Secondary | ICD-10-CM | POA: Diagnosis not present

## 2016-08-24 NOTE — Progress Notes (Signed)
Daily Session Note  Patient Details  Name: Edward [name] MRN: [id number] Date of Birth: 06-26-1950 Referring Provider:     Cardiac Rehab from 06/21/2016 in [facility] Cardiac and Pulmonary Rehab  Referring Provider  End, Harrell Gave MD      Encounter Date: 08/24/2016  Check In:     Session Check In - 08/24/16 0908      Check-In   Location [facility]-Cardiac & Pulmonary Rehab   Staff Present Heath Lark, RN, BSN, CCRP;Jessica [place], MA, ACSM RCEP, Exercise Physiologist;Emmelyn Schmale Oletta Darter, BA, ACSM CEP, Exercise Physiologist   Supervising physician immediately available to respond to emergencies See telemetry face sheet for immediately available ER MD   Medication changes reported     No   Fall or balance concerns reported    No   Warm-up and Cool-down Performed on first and last piece of equipment   Resistance Training Performed Yes   VAD Patient? No     Pain Assessment   Currently in Pain? No/denies         History  Smoking Status  . Former Smoker  . Start date: 07/03/2007  Smokeless Tobacco  . Never Used    Comment: QUIT IN 2005    Goals Met:  Independence with exercise equipment Exercise tolerated well No report of cardiac concerns or symptoms Strength training completed today  Goals Unmet:  Not Applicable  Comments: Pt able to follow exercise prescription today without complaint.  Will continue to monitor for progression.    Dr. Emily Filbert is Medical Director for [place] [facility].

## 2016-08-26 ENCOUNTER — Ambulatory Visit: Payer: Commercial Managed Care - PPO

## 2016-08-26 DIAGNOSIS — I252 Old myocardial infarction: Secondary | ICD-10-CM | POA: Diagnosis not present

## 2016-08-26 DIAGNOSIS — I213 ST elevation (STEMI) myocardial infarction of unspecified site: Secondary | ICD-10-CM

## 2016-08-26 DIAGNOSIS — Z955 Presence of coronary angioplasty implant and graft: Secondary | ICD-10-CM

## 2016-08-27 ENCOUNTER — Emergency Department: Payer: Commercial Managed Care - PPO

## 2016-08-27 ENCOUNTER — Observation Stay
Admission: EM | Admit: 2016-08-27 | Discharge: 2016-08-28 | Disposition: A | Discharge status: Home / Self Care | Payer: Commercial Managed Care - PPO | Attending: Internal Medicine | Admitting: Internal Medicine

## 2016-08-27 ENCOUNTER — Encounter: Payer: Self-pay | Admitting: Emergency Medicine

## 2016-08-27 ENCOUNTER — Other Ambulatory Visit: Payer: Self-pay

## 2016-08-27 DIAGNOSIS — Z79899 Other long term (current) drug therapy: Secondary | ICD-10-CM | POA: Diagnosis not present

## 2016-08-27 DIAGNOSIS — N400 Enlarged prostate without lower urinary tract symptoms: Secondary | ICD-10-CM | POA: Diagnosis not present

## 2016-08-27 DIAGNOSIS — R079 Chest pain, unspecified: Secondary | ICD-10-CM | POA: Diagnosis present

## 2016-08-27 DIAGNOSIS — Z955 Presence of coronary angioplasty implant and graft: Secondary | ICD-10-CM | POA: Insufficient documentation

## 2016-08-27 DIAGNOSIS — K219 Gastro-esophageal reflux disease without esophagitis: Secondary | ICD-10-CM | POA: Diagnosis not present

## 2016-08-27 DIAGNOSIS — I255 Ischemic cardiomyopathy: Secondary | ICD-10-CM | POA: Diagnosis not present

## 2016-08-27 DIAGNOSIS — R0789 Other chest pain: Secondary | ICD-10-CM

## 2016-08-27 DIAGNOSIS — I2510 Atherosclerotic heart disease of native coronary artery without angina pectoris: Secondary | ICD-10-CM | POA: Diagnosis not present

## 2016-08-27 DIAGNOSIS — Z8679 Personal history of other diseases of the circulatory system: Secondary | ICD-10-CM | POA: Insufficient documentation

## 2016-08-27 DIAGNOSIS — I252 Old myocardial infarction: Secondary | ICD-10-CM | POA: Diagnosis not present

## 2016-08-27 DIAGNOSIS — R002 Palpitations: Secondary | ICD-10-CM | POA: Insufficient documentation

## 2016-08-27 DIAGNOSIS — E785 Hyperlipidemia, unspecified: Secondary | ICD-10-CM | POA: Diagnosis not present

## 2016-08-27 DIAGNOSIS — I5022 Chronic systolic (congestive) heart failure: Secondary | ICD-10-CM | POA: Insufficient documentation

## 2016-08-27 DIAGNOSIS — Z7902 Long term (current) use of antithrombotics/antiplatelets: Secondary | ICD-10-CM | POA: Diagnosis not present

## 2016-08-27 DIAGNOSIS — Z87891 Personal history of nicotine dependence: Secondary | ICD-10-CM | POA: Diagnosis not present

## 2016-08-27 DIAGNOSIS — I110 Hypertensive heart disease with heart failure: Secondary | ICD-10-CM | POA: Diagnosis not present

## 2016-08-27 DIAGNOSIS — F419 Anxiety disorder, unspecified: Secondary | ICD-10-CM | POA: Diagnosis not present

## 2016-08-27 DIAGNOSIS — Z7982 Long term (current) use of aspirin: Secondary | ICD-10-CM | POA: Diagnosis not present

## 2016-08-27 LAB — CBC
HEMATOCRIT: 43.7 % (ref 40.0–52.0)
HEMOGLOBIN: 14.9 g/dL (ref 13.0–18.0)
MCH: 32.9 pg (ref 26.0–34.0)
MCHC: 34 g/dL (ref 32.0–36.0)
MCV: 96.7 fL (ref 80.0–100.0)
Platelets: 195 10*3/uL (ref 150–440)
RBC: 4.52 MIL/uL (ref 4.40–5.90)
RDW: 13.1 % (ref 11.5–14.5)
WBC: 5.2 10*3/uL (ref 3.8–10.6)

## 2016-08-27 LAB — BASIC METABOLIC PANEL
ANION GAP: 7 (ref 5–15)
BUN: 18 mg/dL (ref 6–20)
CO2: 26 mmol/L (ref 22–32)
Calcium: 9.3 mg/dL (ref 8.9–10.3)
Chloride: 105 mmol/L (ref 101–111)
Creatinine, Ser: 0.87 mg/dL (ref 0.61–1.24)
GFR calc Af Amer: >60 mL/min (ref >60)
GLUCOSE: 114 mg/dL — AB (ref 65–99)
Potassium: 4 mmol/L (ref 3.5–5.1)
Sodium: 138 mmol/L (ref 135–145)

## 2016-08-27 LAB — TSH: TSH: 0.938 u[IU]/mL (ref 0.350–4.500)

## 2016-08-27 LAB — TROPONIN I: Troponin I: <0.03 ng/mL (ref <0.03)

## 2016-08-27 MED ORDER — CLOPIDOGREL BISULFATE 75 MG PO TABS
75.0000 mg | ORAL_TABLET | Freq: Every day | ORAL | Status: DC
  Administered 2016-08-28: 75 mg via ORAL
  Filled 2016-08-27: qty 1

## 2016-08-27 MED ORDER — SUCRALFATE 1 GM/10ML PO SUSP
1.0000 g | Freq: Three times a day (TID) | ORAL | Status: DC
  Administered 2016-08-27 – 2016-08-28 (×2): 1 g via ORAL
  Filled 2016-08-27 (×7): qty 10

## 2016-08-27 MED ORDER — GI COCKTAIL ~~LOC~~
30.0000 mL | Freq: Three times a day (TID) | ORAL | Status: DC | PRN
  Filled 2016-08-27: qty 30

## 2016-08-27 MED ORDER — LORAZEPAM 0.5 MG PO TABS: 0.5000 mg | ORAL_TABLET | Freq: Four times a day (QID) | ORAL | Status: DC | PRN

## 2016-08-27 MED ORDER — NITROGLYCERIN 2 % TD OINT
1.0000 [in_us] | TOPICAL_OINTMENT | Freq: Four times a day (QID) | TRANSDERMAL | Status: DC
  Administered 2016-08-27: 1 [in_us] via TOPICAL

## 2016-08-27 MED ORDER — FAMOTIDINE 20 MG PO TABS
20.0000 mg | ORAL_TABLET | Freq: Two times a day (BID) | ORAL | Status: DC
  Administered 2016-08-27: 20 mg via ORAL
  Filled 2016-08-27 (×2): qty 1

## 2016-08-27 MED ORDER — ATORVASTATIN CALCIUM 20 MG PO TABS
80.0000 mg | ORAL_TABLET | Freq: Every day | ORAL | Status: DC
  Administered 2016-08-27: 80 mg via ORAL
  Filled 2016-08-27 (×2): qty 4

## 2016-08-27 MED ORDER — CARVEDILOL 3.125 MG PO TABS
3.1250 mg | ORAL_TABLET | Freq: Two times a day (BID) | ORAL | Status: DC
  Administered 2016-08-27 – 2016-08-28 (×2): 3.125 mg via ORAL
  Filled 2016-08-27 (×2): qty 1

## 2016-08-27 MED ORDER — LOSARTAN POTASSIUM 50 MG PO TABS
50.0000 mg | ORAL_TABLET | Freq: Every day | ORAL | Status: DC
  Filled 2016-08-27: qty 1

## 2016-08-27 MED ORDER — ONDANSETRON HCL 4 MG PO TABS
4.0000 mg | ORAL_TABLET | Freq: Four times a day (QID) | ORAL | Status: DC | PRN
Start: 2016-08-27 — End: 2016-08-28

## 2016-08-27 MED ORDER — SODIUM CHLORIDE 0.9 % IV SOLN: 250.0000 mL | INTRAVENOUS | Status: DC | PRN

## 2016-08-27 MED ORDER — IOPAMIDOL (ISOVUE-370) INJECTION 76%
75.0000 mL | Freq: Once | INTRAVENOUS | Status: AC | PRN
  Administered 2016-08-27: 75 mL via INTRAVENOUS

## 2016-08-27 MED ORDER — PANTOPRAZOLE SODIUM 40 MG PO TBEC
40.0000 mg | DELAYED_RELEASE_TABLET | Freq: Every day | ORAL | Status: DC
  Administered 2016-08-28: 40 mg via ORAL
  Filled 2016-08-27: qty 1

## 2016-08-27 MED ORDER — SPIRONOLACTONE 25 MG PO TABS: 12.5000 mg | ORAL_TABLET | Freq: Every day | ORAL | Status: DC

## 2016-08-27 MED ORDER — SODIUM CHLORIDE 0.9% FLUSH
3.0000 mL | Freq: Two times a day (BID) | INTRAVENOUS | Status: DC
  Administered 2016-08-27 – 2016-08-28 (×2): 3 mL via INTRAVENOUS

## 2016-08-27 MED ORDER — ENOXAPARIN SODIUM 40 MG/0.4ML ~~LOC~~ SOLN: 40.0000 mg | SUBCUTANEOUS | Status: DC

## 2016-08-27 MED ORDER — SODIUM CHLORIDE 0.9% FLUSH: 3.0000 mL | INTRAVENOUS | Status: DC | PRN

## 2016-08-27 MED ORDER — NITROGLYCERIN 0.4 MG SL SUBL: 0.4000 mg | SUBLINGUAL_TABLET | SUBLINGUAL | Status: DC | PRN

## 2016-08-27 MED ORDER — NITROGLYCERIN 2 % TD OINT
TOPICAL_OINTMENT | TRANSDERMAL | Status: AC
  Administered 2016-08-27: 1 [in_us] via TOPICAL
  Filled 2016-08-27: qty 1

## 2016-08-27 MED ORDER — ONDANSETRON HCL 4 MG/2ML IJ SOLN: 4.0000 mg | Freq: Four times a day (QID) | INTRAMUSCULAR | Status: DC | PRN

## 2016-08-27 MED ORDER — ASPIRIN EC 81 MG PO TBEC
81.0000 mg | DELAYED_RELEASE_TABLET | Freq: Every day | ORAL | Status: DC
  Administered 2016-08-28: 81 mg via ORAL
  Filled 2016-08-27: qty 1

## 2016-08-27 NOTE — ED Triage Notes (Signed)
Patient to ER for c/o mid chest pain that patient describes as "feels like indigestion". Patient has previous h/o MI in December and in January. States pain is similar, but not as bad as episode in December, was unaware of episode in January. Patient states pain is intermittently worse, "feels better with belching".

## 2016-08-27 NOTE — ED Provider Notes (Signed)
[facility] Emergency Department Provider Note       Time seen: ----------------------------------------- 11:14 AM on 08/27/2016 -----------------------------------------     I have reviewed the triage vital signs and the nursing notes.   HISTORY   Chief Complaint Chest Pain    HPI Edward [name] is a 66 y.o. male who presents to the ED for chest pain that he describes feeling like indigestion. Patient was seen for heart attack in December and in January. Patient underwent stenting of his left main artery and had an apical mural thrombus  for which she took Coumadin. Patient stopped taking Coumadin about a month ago. He states this week he has had feelings of indigestion so he decided to come in for evaluation. Patient states pain is intermittent and worse when he takes a breath. Patient has taken over-the-counter antacids without improvement.   Past Medical History:  Diagnosis Date  . Anxiety   . Apical mural thrombus   . BPH (benign prostatic hyperplasia)   . CHF (congestive heart failure) ([place])   . Coronary artery disease   . Hyperlipidemia   . Hypertension     Patient Active Problem List   Diagnosis Date Noted  . Chronic systolic heart failure ([place]) 04/07/2016  . Coronary artery disease involving native coronary artery of native heart without angina pectoris 03/25/2016  . Apical mural thrombus following MI ([place]) 03/22/2016  . Encounter for therapeutic drug monitoring 03/22/2016  . Clostridium difficile diarrhea   . Ischemic cardiomyopathy   . Smoker   . Hyperlipidemia LDL goal <70   . Encounter for anticoagulation discussion and counseling   . ST elevation myocardial infarction (STEMI) ([place]) 03/09/2016  . Anxiety 07/30/2014  . Arthritis of neck ([place]) 07/30/2014  . ED (erectile dysfunction) of organic origin 07/30/2014  . Personal history of tobacco use, presenting hazards to health 07/30/2014  . Adiposity 07/30/2014  . Delayed onset of  urination 07/30/2014  . Brachial neuritis 08/26/2009  . Actinic keratoses 07/30/2008  . Essential (primary) hypertension 10/29/2005    Past Surgical History:  Procedure Laterality Date  . CORONARY ANGIOGRAPHY N/A 03/09/2016   Procedure: Coronary Angiography;  Surgeon: Nelva Bush, MD;  Location: [place] CV [facility];  Service: Cardiovascular;  Laterality: N/A;  . CORONARY STENT INTERVENTION N/A 03/09/2016   Procedure: Coronary Stent Intervention;  Surgeon: Nelva Bush, MD;  Location: [place] [facility];  Service: Cardiovascular;  Laterality: N/A;  . HERNIA REPAIR  2005  . HYDROCELE EXCISION      Allergies Patient has no known allergies.  Social History Social History  Substance Use Topics  . Smoking status: Former Smoker    Start date: 07/03/2007  . Smokeless tobacco: Never Used     Comment: QUIT IN 2005  . Alcohol use 1.8 oz/week    3 Cans of beer per week    Review of Systems Constitutional: Negative for fever. Eyes: Negative for vision changes ENT:  Negative for congestion, sore throat Cardiovascular: Positive for pleuritic pain Respiratory: Negative for shortness of breath. Gastrointestinal: Negative for abdominal pain, vomiting and diarrhea. Genitourinary: Negative for dysuria. Musculoskeletal: Negative for back pain. Skin: Negative for rash. Neurological: Negative for headaches, focal weakness or numbness.  All systems negative/normal/unremarkable except as stated in the HPI  ____________________________________________   PHYSICAL EXAM:  VITAL SIGNS: ED Triage Vitals  Enc Vitals Group     BP 08/27/16 0941 (!) 157/75     Pulse Rate 08/27/16 0941 (!) 57     Resp 08/27/16  0941 18     Temp 08/27/16 0941 98.8 F (37.1 C)     Temp Source 08/27/16 0941 Oral     SpO2 08/27/16 0941 100 %     Weight 08/27/16 0937 202 lb (91.6 kg)     Height 08/27/16 0937 5\' 10"  (1.778 m)     Head Circumference --      Peak Flow --      Pain Score 08/27/16 0936 2      Pain Loc --      Pain Edu? --      Excl. in [place]? --     Constitutional: Alert and oriented. Well appearing and in no distress. Eyes: Conjunctivae are normal. Normal extraocular movements. ENT   Head: Normocephalic and atraumatic.   Nose: No congestion/rhinnorhea.   Mouth/Throat: Mucous membranes are moist.   Neck: No stridor. Cardiovascular: Normal rate, regular rhythm. No murmurs, rubs, or gallops. Respiratory: Normal respiratory effort without tachypnea nor retractions. Breath sounds are clear and equal bilaterally. No wheezes/rales/rhonchi. Gastrointestinal: Soft and nontender. Normal bowel sounds Musculoskeletal: Nontender with normal range of motion in extremities. No lower extremity tenderness nor edema. Neurologic:  Normal speech and language. No gross focal neurologic deficits are appreciated.  Skin:  Skin is warm, dry and intact. No rash noted. Psychiatric: Mood and affect are normal. Speech and behavior are normal.  ____________________________________________  EKG: Interpreted by me. Sinus bradycardia with a rate of 56 bpm, prolonged PR interval, left axis deviation, right bundle-branch block, likely septal infarct age indeterminate  ____________________________________________  ED COURSE:  Pertinent labs & imaging results that were available during my care of the patient were reviewed by me and considered in my medical decision making (see chart for details). Patient presents for chest pain, we will assess with labs and imaging as indicated.   Procedures ____________________________________________   LABS (pertinent positives/negatives)  Labs Reviewed  BASIC METABOLIC PANEL - Abnormal; Notable for the following:       Result Value   Glucose, Bld 114 (*)    All other components within normal limits  CBC  TROPONIN I  TROPONIN I    RADIOLOGY  Chest x-ray is unremarkable CT angiogram of the chest  IMPRESSION: CT is negative for pulmonary emboli.  No acute finding identified.  Evidence of prior PCI and coronary artery disease. ____________________________________________  FINAL ASSESSMENT AND PLAN  Chest pain  Plan: Patient's labs and imaging were dictated above. Patient had presented for Chest pain of uncertain etiology. Pain seems to be related to his GI tract or musculoskeletal, however he had these exact symptoms with his prior MI. I have drawn a second troponin and was prepared to discharge the patient but he would prefer he would be admitted and Dr. Saunders Revel his cardiologist would prefer this as well.   Earleen Newport, MD   Note: This note was generated in part or whole with voice recognition software. Voice recognition is usually quite accurate but there are transcription errors that can and very often do occur. I apologize for any typographical errors that were not detected and corrected.     Earleen Newport, MD 08/27/16 [phone]

## 2016-08-27 NOTE — ED Notes (Signed)
Patient updated on wait time and lab results.

## 2016-08-27 NOTE — H&P (Signed)
[facility] [place] NAME: Edward [name]    MR#:  [id number]  DATE OF BIRTH:  [birth year]  DATE OF ADMISSION:  08/27/2016  PRIMARY CARE PHYSICIAN: Chrismon, Vickki Muff, PA   REQUESTING/REFERRING PHYSICIAN:   CHIEF COMPLAINT:   Chief Complaint  Patient presents with  . Chest Pain    HISTORY OF PRESENT ILLNESS: Edward [name]  is a 66 y.o. male with a known history of Coronary artery disease, anxiety, BPH, chronic systolic CHF, cardiomyopathy with ejection fraction of 35-40%, apical mural thrombus following MI, hypertension, hyperlipidemia, who presents to the hospital with complaints of left lower chest area pain. Pain was described. This ache 2 - 3 out of 10 by intensity, coming and going, related to breathing, isolation, has been happening since 6 PM last night, was no other alleviating or relieving factors, no shortness of breath. On arrival to the hospital. Patient's EKG was remarkable for sinus bradycardia, but no acute ST-T changes, patient underwent CT angiogram of the chest, revealing no pulmonary embolism. Cardiac enzymes were negative. Cardiologist was contacted, who suggested admission, since patient felt that his pain is similar to the pain that he experienced with prior MI. Hospitalist services were contacted for admission  PAST MEDICAL HISTORY:   Past Medical History:  Diagnosis Date  . Anxiety   . Apical mural thrombus   . BPH (benign prostatic hyperplasia)   . CHF (congestive heart failure) ([place])   . Coronary artery disease   . Hyperlipidemia   . Hypertension     PAST SURGICAL HISTORY: Past Surgical History:  Procedure Laterality Date  . CORONARY ANGIOGRAPHY N/A 03/09/2016   Procedure: Coronary Angiography;  Surgeon: Nelva Bush, MD;  Location: [facility];  Service: Cardiovascular;  Laterality: N/A;  . CORONARY STENT INTERVENTION N/A 03/09/2016   Procedure: Coronary Stent Intervention;  Surgeon: Nelva Bush,  MD;  Location: [facility];  Service: Cardiovascular;  Laterality: N/A;  . HERNIA REPAIR  2005  . HYDROCELE EXCISION      SOCIAL HISTORY:  Social History  Substance Use Topics  . Smoking status: Former Smoker    Start date: 07/03/2007  . Smokeless tobacco: Never Used     Comment: QUIT IN 2005  . Alcohol use 1.8 oz/week    3 Cans of beer per week    FAMILY HISTORY:  Family History  Problem Relation Age of Onset  . Emphysema Mother   . Macular degeneration Mother   . Heart attack Mother   . Heart attack Father     DRUG ALLERGIES: No Known Allergies  Review of Systems  Constitutional: Negative for chills, fever and weight loss.  HENT: Negative for congestion.   Eyes: Negative for blurred vision and double vision.  Respiratory: Negative for cough, sputum production, shortness of breath and wheezing.   Cardiovascular: Positive for chest pain and palpitations. Negative for orthopnea, leg swelling and PND.  Gastrointestinal: Negative for abdominal pain, blood in stool, constipation, diarrhea, nausea and vomiting.  Genitourinary: Negative for dysuria, frequency, hematuria and urgency.  Musculoskeletal: Negative for falls.  Neurological: Positive for dizziness. Negative for tremors, focal weakness and headaches.  Endo/Heme/Allergies: Does not bruise/bleed easily.  Psychiatric/Behavioral: Negative for depression. The patient does not have insomnia.     MEDICATIONS AT HOME:  Prior to Admission medications   Medication Sig Start Date End Date Taking? Authorizing Provider  aspirin EC 81 MG tablet Take 1 tablet (81 mg total) by mouth daily.  06/16/16  Yes End, Harrell Gave, MD  atorvastatin (LIPITOR) 80 MG tablet Take 1 tablet (80 mg total) by mouth daily. 04/14/16 08/27/16 Yes End, Harrell Gave, MD  carvedilol (COREG) 3.125 MG tablet Take 1 tablet (3.125 mg total) by mouth 2 (two) times daily. 04/14/16 08/27/16 Yes End, Harrell Gave, MD  clopidogrel (PLAVIX) 75 MG tablet Take 1 tablet  (75 mg total) by mouth daily. 04/14/16  Yes End, Harrell Gave, MD  famotidine (PEPCID) 20 MG tablet TAKE 1 TABLET (20 MG TOTAL) BY MOUTH 2 (TWO) TIMES DAILY. 07/12/16  Yes Chrismon, Vickki Muff, PA  LORazepam (ATIVAN) 0.5 MG tablet TAKE 1 TABLET BY MOUTH 3 TIMES A DAY AS NEEDED FOR ANXIETY 10/14/14  Yes Chrismon, Vickki Muff, PA  losartan (COZAAR) 25 MG tablet Take 2 tablets (50 mg total) by mouth daily. 05/12/16 08/27/16 Yes End, Harrell Gave, MD  nitroGLYCERIN (NITROSTAT) 0.4 MG SL tablet Place 1 tablet (0.4 mg total) under the tongue every 5 (five) minutes as needed for chest pain. Maximum of 3 doses. 07/01/16 09/29/16 Yes End, Harrell Gave, MD  spironolactone (ALDACTONE) 25 MG tablet Take 0.5 tablets (12.5 mg total) by mouth daily. 04/14/16 08/27/16 Yes End, Harrell Gave, MD      PHYSICAL EXAMINATION:   VITAL SIGNS: Blood pressure (!) 137/92, pulse (!) 57, temperature 98.8 F (37.1 C), temperature source Oral, resp. rate 16, height 5\' 10"  (1.778 m), weight 91.6 kg (202 lb), SpO2 99 %.  GENERAL:  66 y.o.-year-old patient lying in the bed with no acute distress.  EYES: Pupils equal, round, reactive to light and accommodation. No scleral icterus. Extraocular muscles intact.  HEENT: Head atraumatic, normocephalic. Oropharynx and nasopharynx clear.  NECK:  Supple, no jugular venous distention. No thyroid enlargement, no tenderness.  LUNGS: Normal breath sounds bilaterally, no wheezing, rales,rhonchi or crepitation. No use of accessory muscles of respiration.  CARDIOVASCULAR: S1, S2 normal. No murmurs, rubs, or gallops.  ABDOMEN: Soft, nontender, nondistended. Bowel sounds present. No organomegaly or mass.  EXTREMITIES: No pedal edema, cyanosis, or clubbing.  NEUROLOGIC: Cranial nerves II through XII are intact. Muscle strength 5/5 in all extremities. Sensation intact. Gait not checked.  PSYCHIATRIC: The patient is alert and oriented x 3.  SKIN: No obvious rash, lesion, or ulcer.   LABORATORY PANEL:    CBC  Recent Labs Lab 08/27/16 0937  WBC 5.2  HGB 14.9  HCT 43.7  PLT 195  MCV 96.7  MCH 32.9  MCHC 34.0  RDW 13.1   ------------------------------------------------------------------------------------------------------------------  Chemistries   Recent Labs Lab 08/27/16 0937  NA 138  K 4.0  CL 105  CO2 26  GLUCOSE 114*  BUN 18  CREATININE 0.87  CALCIUM 9.3   ------------------------------------------------------------------------------------------------------------------  Cardiac Enzymes  Recent Labs Lab 08/27/16 0937 08/27/16 1230  TROPONINI <0.03 <0.03   ------------------------------------------------------------------------------------------------------------------  RADIOLOGY: Dg Chest 2 View  Result Date: 08/27/2016 CLINICAL DATA:  Chest pain. EXAM: CHEST  2 VIEW COMPARISON:  None. FINDINGS: The cardiomediastinal silhouette is normal in size. Normal pulmonary vascularity. Atherosclerotic calcification of the aortic arch. Mild left basilar atelectasis. No focal consolidation, pleural effusion, or pneumothorax. No acute osseous abnormality. IMPRESSION: 1.  No active cardiopulmonary disease. 2.  Aortic Atherosclerosis (ICD10-I70.0). Electronically Signed   By: Titus Dubin M.D.   On: 08/27/2016 10:07   Ct Angio Chest Pe W And/or Wo Contrast  Result Date: 08/27/2016 CLINICAL DATA:  66 year old male with a history of indigestion EXAM: CT ANGIOGRAPHY CHEST WITH CONTRAST TECHNIQUE: Multidetector CT imaging of the chest was performed using the standard protocol  during bolus administration of intravenous contrast. Multiplanar CT image reconstructions and MIPs were obtained to evaluate the vascular anatomy. CONTRAST:  75 cc Isovue 370 COMPARISON:  None. FINDINGS: Cardiovascular: Heart: No cardiomegaly. No pericardial fluid/thickening. Calcifications/ stenting of the left anterior descending coronary artery. Calcifications of circumflex coronary artery and right  coronary artery. Aorta: Unremarkable course, caliber, contour of the thoracic aorta. No aneurysm or dissection flap. No periaortic fluid. Pulmonary arteries: No central, lobar, segmental, or proximal subsegmental filling defects. Mediastinum/Nodes: Mediastinal lymph nodes are present, none of which are enlarged by CT size criteria. Unremarkable appearance of the thoracic esophagus. Unremarkable appearance of the thoracic inlet and thyroid. Lungs/Pleura: Central airways are clear. No pleural effusion. No confluent left-sided airspace disease. No pneumothorax. Upper Abdomen: Unremarkable. Musculoskeletal: No displaced fracture. Degenerative changes of the spine. Pectus deformity. Review of the MIP images confirms the above findings. IMPRESSION: CT is negative for pulmonary emboli.  No acute finding identified. Evidence of prior PCI and coronary artery disease. Electronically Signed   By: Corrie Mckusick D.O.   On: 08/27/2016 11:54    EKG: Orders placed or performed during the hospital encounter of 08/27/16  . ED EKG within 10 minutes  . ED EKG within 10 minutes  EKG in the emergency room revealed sinus bradycardia at 56 bpm, left axis deviation, right bundle branch block, anteroseptal infarct with QS in V3, nonspecific ST-T changes  IMPRESSION AND PLAN:  Active Problems:   Chest pain  #1. Chest pain of unclear etiology at this time, the patient, medical floor, continue Coreg, nitroglycerin topically, aspirin, Plavix, Lipitor, follow cardiac enzymes 3, get Myoview stress test in the morning, cardiology consultation, suspect noncardiac course #2. Burping, gastroesophageal reflux disease, change patient's Pepcid to Protonix, give Carafate while in the hospital, follow clinically #3. Anxiety, continue patient on Ativan #4. Cardiomyopathy, continue patient on Aldactone, Cozaar, Coreg #5. Essential hypertension, continue patient on current medications, advance as needed  All the records are reviewed and  case discussed with ED provider. Management plans discussed with the patient, family and they are in agreement.  CODE STATUS: Code Status History    Date Active Date Inactive Code Status Order ID Comments User Context   03/09/2016  4:42 PM 03/19/2016  9:39 PM Full Code [id number]  Gladstone Lighter, MD Inpatient   03/09/2016  4:42 PM 03/09/2016  4:42 PM Full Code [id number]  Nelva Bush, MD Inpatient       TOTAL TIME TAKING CARE OF THIS PATIENT: 50 minutes.    Theodoro Grist M.D on 08/27/2016 at 2:19 PM  Between 7am to 6pm - Pager - [phone] After 6pm go to [email or web address] - password [email or web address] [facility]  Office  [phone]  CC: Primary care physician; Margo Common, PA

## 2016-08-27 NOTE — ED Notes (Signed)
Lab results reviewed. Awaiting room for MD eval. Patient observed in lobby in no acute distress.

## 2016-08-27 NOTE — ED Notes (Signed)
Patient transported to CT 

## 2016-08-27 NOTE — ED Notes (Signed)
Patient transported to X-ray 

## 2016-08-28 ENCOUNTER — Other Ambulatory Visit: Payer: Commercial Managed Care - PPO

## 2016-08-28 ENCOUNTER — Observation Stay (HOSPITAL_BASED_OUTPATIENT_CLINIC_OR_DEPARTMENT_OTHER): Payer: Commercial Managed Care - PPO

## 2016-08-28 DIAGNOSIS — R079 Chest pain, unspecified: Secondary | ICD-10-CM

## 2016-08-28 DIAGNOSIS — I2102 ST elevation (STEMI) myocardial infarction involving left anterior descending coronary artery: Secondary | ICD-10-CM | POA: Diagnosis not present

## 2016-08-28 DIAGNOSIS — R0789 Other chest pain: Secondary | ICD-10-CM

## 2016-08-28 DIAGNOSIS — F419 Anxiety disorder, unspecified: Secondary | ICD-10-CM

## 2016-08-28 DIAGNOSIS — I25118 Atherosclerotic heart disease of native coronary artery with other forms of angina pectoris: Secondary | ICD-10-CM | POA: Diagnosis not present

## 2016-08-28 DIAGNOSIS — E782 Mixed hyperlipidemia: Secondary | ICD-10-CM

## 2016-08-28 LAB — BASIC METABOLIC PANEL
ANION GAP: 6 (ref 5–15)
BUN: 15 mg/dL (ref 6–20)
CHLORIDE: 106 mmol/L (ref 101–111)
CO2: 27 mmol/L (ref 22–32)
Calcium: 8.9 mg/dL (ref 8.9–10.3)
Creatinine, Ser: 0.81 mg/dL (ref 0.61–1.24)
GFR calc non Af Amer: >60 mL/min (ref >60)
GLUCOSE: 96 mg/dL (ref 65–99)
POTASSIUM: 4.2 mmol/L (ref 3.5–5.1)
Sodium: 139 mmol/L (ref 135–145)

## 2016-08-28 LAB — NM MYOCAR MULTI W/SPECT W/WALL MOTION / EF
CHL CUP NUCLEAR SDS: 0
CHL CUP NUCLEAR SRS: 28
CHL CUP NUCLEAR SSS: 11
CSEPPHR: 77 {beats}/min
LV dias vol: 125 mL (ref 62–150)
LV sys vol: 43 mL
Rest HR: 49 {beats}/min
TID: 1.17

## 2016-08-28 LAB — CBC
HEMATOCRIT: 39.6 % — AB (ref 40.0–52.0)
HEMOGLOBIN: 13.6 g/dL (ref 13.0–18.0)
MCH: 33.2 pg (ref 26.0–34.0)
MCHC: 34.5 g/dL (ref 32.0–36.0)
MCV: 96.2 fL (ref 80.0–100.0)
Platelets: 175 10*3/uL (ref 150–440)
RBC: 4.11 MIL/uL — AB (ref 4.40–5.90)
RDW: 13.3 % (ref 11.5–14.5)
WBC: 5.7 10*3/uL (ref 3.8–10.6)

## 2016-08-28 LAB — TROPONIN I: Troponin I: <0.03 ng/mL (ref <0.03)

## 2016-08-28 LAB — HEMOGLOBIN A1C
HEMOGLOBIN A1C: 5.5 % (ref 4.8–5.6)
Mean Plasma Glucose: 111 mg/dL

## 2016-08-28 LAB — GLUCOSE, CAPILLARY: Glucose-Capillary: 108 mg/dL — ABNORMAL HIGH (ref 65–99)

## 2016-08-28 MED ORDER — ATORVASTATIN CALCIUM 80 MG PO TABS
80.0000 mg | ORAL_TABLET | Freq: Every day | ORAL | 3 refills | Status: DC
Start: 2016-08-28 — End: 2017-01-04

## 2016-08-28 MED ORDER — TECHNETIUM TC 99M TETROFOSMIN IV KIT
13.0000 | PACK | Freq: Once | INTRAVENOUS | Status: AC | PRN
  Administered 2016-08-28: 13.005 via INTRAVENOUS

## 2016-08-28 MED ORDER — SPIRONOLACTONE 25 MG PO TABS: 12.5000 mg | ORAL_TABLET | Freq: Every day | ORAL | 3 refills | Status: DC

## 2016-08-28 MED ORDER — TECHNETIUM TC 99M TETROFOSMIN IV KIT
30.0000 | PACK | Freq: Once | INTRAVENOUS | Status: AC | PRN
  Administered 2016-08-28: 30.24 via INTRAVENOUS
  Filled 2016-08-28: qty 30

## 2016-08-28 MED ORDER — REGADENOSON 0.4 MG/5ML IV SOLN
0.4000 mg | Freq: Once | INTRAVENOUS | Status: AC
  Administered 2016-08-28: 0.4 mg via INTRAVENOUS
  Filled 2016-08-28: qty 5

## 2016-08-28 MED ORDER — LOSARTAN POTASSIUM 25 MG PO TABS: 25.0000 mg | ORAL_TABLET | Freq: Every day | ORAL | 3 refills | Status: DC

## 2016-08-28 MED ORDER — CARVEDILOL 3.125 MG PO TABS: 3.1250 mg | ORAL_TABLET | Freq: Two times a day (BID) | ORAL | 3 refills | Status: DC

## 2016-08-28 NOTE — Progress Notes (Signed)
[name] at [place] NAME: Edward [name]    MR#:  [id number]  DATE OF BIRTH:  [birth year]  SUBJECTIVE:   Patient presents with chest pain No chest pain overnight No belching or symptoms of heartburn  REVIEW OF SYSTEMS:    Review of Systems  Constitutional: Negative for fever, chills weight loss HENT: Negative for ear pain, nosebleeds, congestion, facial swelling, rhinorrhea, neck pain, neck stiffness and ear discharge.   Respiratory: Negative for cough, shortness of breath, wheezing  Cardiovascular: Negative for chest pain, palpitations and leg swelling.  Gastrointestinal: Negative for heartburn, abdominal pain, vomiting, diarrhea or consitpation Genitourinary: Negative for dysuria, urgency, frequency, hematuria Musculoskeletal: Negative for back pain or joint pain Neurological: Negative for dizziness, seizures, syncope, focal weakness,  numbness and headaches.  Hematological: Does not bruise/bleed easily.  Psychiatric/Behavioral: Negative for hallucinations, confusion, dysphoric mood    Tolerating Diet: npo      DRUG ALLERGIES:  No Known Allergies  VITALS:  Blood pressure 97/60, pulse (!) 54, temperature 97.6 F (36.4 C), temperature source Oral, resp. rate 18, height 5\' 10"  (1.778 m), weight 91.1 kg (200 lb 14.4 oz), SpO2 97 %.  PHYSICAL EXAMINATION:  Constitutional: Appears well-developed and well-nourished. No distress. HENT: Normocephalic. Marland Kitchen Oropharynx is clear and moist.  Eyes: Conjunctivae and EOM are normal. PERRLA, no scleral icterus.  Neck: Normal ROM. Neck supple. No JVD. No tracheal deviation. CVS: RRR, S1/S2 +, no murmurs, no gallops, no carotid bruit.  Pulmonary: Effort and breath sounds normal, no stridor, rhonchi, wheezes, rales.  Abdominal: Soft. BS +,  no distension, tenderness, rebound or guarding.  Musculoskeletal: Normal range of motion. No edema and no tenderness.  Neuro: Alert. CN 2-12 grossly intact. No  focal deficits. Skin: Skin is warm and dry. No rash noted. Psychiatric: Normal mood and affect.      LABORATORY PANEL:   CBC  Recent Labs Lab 08/28/16 0034  WBC 5.7  HGB 13.6  HCT 39.6*  PLT 175   ------------------------------------------------------------------------------------------------------------------  Chemistries   Recent Labs Lab 08/28/16 0034  NA 139  K 4.2  CL 106  CO2 27  GLUCOSE 96  BUN 15  CREATININE 0.81  CALCIUM 8.9   ------------------------------------------------------------------------------------------------------------------  Cardiac Enzymes  Recent Labs Lab 08/27/16 0937 08/27/16 1230 08/28/16 0034  TROPONINI <0.03 <0.03 <0.03   ------------------------------------------------------------------------------------------------------------------  RADIOLOGY:  Dg Chest 2 View  Result Date: 08/27/2016 CLINICAL DATA:  Chest pain. EXAM: CHEST  2 VIEW COMPARISON:  None. FINDINGS: The cardiomediastinal silhouette is normal in size. Normal pulmonary vascularity. Atherosclerotic calcification of the aortic arch. Mild left basilar atelectasis. No focal consolidation, pleural effusion, or pneumothorax. No acute osseous abnormality. IMPRESSION: 1.  No active cardiopulmonary disease. 2.  Aortic Atherosclerosis (ICD10-I70.0). Electronically Signed   By: Titus Dubin M.D.   On: 08/27/2016 10:07   Ct Angio Chest Pe W And/or Wo Contrast  Result Date: 08/27/2016 CLINICAL DATA:  66 year old male with a history of indigestion EXAM: CT ANGIOGRAPHY CHEST WITH CONTRAST TECHNIQUE: Multidetector CT imaging of the chest was performed using the standard protocol during bolus administration of intravenous contrast. Multiplanar CT image reconstructions and MIPs were obtained to evaluate the vascular anatomy. CONTRAST:  75 cc Isovue 370 COMPARISON:  None. FINDINGS: Cardiovascular: Heart: No cardiomegaly. No pericardial fluid/thickening. Calcifications/ stenting of the  left anterior descending coronary artery. Calcifications of circumflex coronary artery and right coronary artery. Aorta: Unremarkable course, caliber, contour of the thoracic aorta. No aneurysm or dissection flap. No periaortic  fluid. Pulmonary arteries: No central, lobar, segmental, or proximal subsegmental filling defects. Mediastinum/Nodes: Mediastinal lymph nodes are present, none of which are enlarged by CT size criteria. Unremarkable appearance of the thoracic esophagus. Unremarkable appearance of the thoracic inlet and thyroid. Lungs/Pleura: Central airways are clear. No pleural effusion. No confluent left-sided airspace disease. No pneumothorax. Upper Abdomen: Unremarkable. Musculoskeletal: No displaced fracture. Degenerative changes of the spine. Pectus deformity. Review of the MIP images confirms the above findings. IMPRESSION: CT is negative for pulmonary emboli.  No acute finding identified. Evidence of prior PCI and coronary artery disease. Electronically Signed   By: Corrie Mckusick D.O.   On: 08/27/2016 11:54     ASSESSMENT AND PLAN:   66 year old male with known history of CAD and chronic systolic heart failure ejection fraction 35-40% who presents with chest pain.  1. Chest pain: Patient will undergo stress test  2. CAD: Patient will continue Coreg, aspirin, Plavix and statin  3. Essential hypertension: Continue Coreg and Aldactone if blood pressure tolerates  4. Cardiomyopathy ejection fraction 35-40% without evidence of exacerbation: Continue Aldactone, Cozaar and Coreg if blood pressure tolerates  Management plans discussed with the patient and he is in agreement.  CODE STATUS: full  TOTAL TIME TAKING CARE OF THIS PATIENT: 35 minutes.     POSSIBLE D/C today or tomorrow, DEPENDING ON CLINICAL CONDITION.   Hadiya Spoerl M.D on 08/28/2016 at 8:39 AM  Between 7am to 6pm - Pager - [phone] After 6pm go to [email or web address] - password [email or web address] [facility]   Office  [phone]  CC: Primary care physician; Margo Common, PA  Note: This dictation was prepared with Dragon dictation along with smaller phrase technology. Any transcriptional errors that result from this process are unintentional.

## 2016-08-28 NOTE — Progress Notes (Signed)
Discharge instruction explained to pt/ verbalized an understanding/ iv and tele removed/ will transport off unit when ride arrives

## 2016-08-28 NOTE — Discharge Summary (Signed)
[facility] NAME: Edward [name]    MR#:  [id number]  DATE OF BIRTH:  28-Dec-1950  DATE OF ADMISSION:  08/27/2016 ADMITTING PHYSICIAN: Theodoro Grist, MD  DATE OF DISCHARGE: 08/28/2016  PRIMARY CARE PHYSICIAN: Chrismon, Vickki Muff, PA    ADMISSION DIAGNOSIS:  Chest pain [R07.9] Nonspecific chest pain [R07.9]  DISCHARGE DIAGNOSIS:  Active Problems:   Chest pain   SECONDARY DIAGNOSIS:   Past Medical History:  Diagnosis Date  . Anxiety   . Apical mural thrombus   . BPH (benign prostatic hyperplasia)   . CHF (congestive heart failure) ([place])   . Coronary artery disease   . Hyperlipidemia   . Hypertension     HOSPITAL COURSE:   66 year old male with known history of CAD and chronic systolic heart failure ejection fraction 35-40% who presents with chest pain.  1. Chest pain: Patient underwent stress test which was negative/low risk for ischemia.  2. CAD: Patient will continue Coreg, aspirin, Plavix and statin  3. Essential hypertension: Continue Coreg and Aldactone if blood pressure tolerates  4. Cardiomyopathy ejection fraction 35-40% without evidence of exacerbation: Continue Aldactone, Cozaar and Coreg if blood pressure tolerates   DISCHARGE CONDITIONS AND DIET:  Stable Cardiac diet  CONSULTS OBTAINED:  Treatment Team:  Dionisio David, MD  DRUG ALLERGIES:  No Known Allergies  DISCHARGE MEDICATIONS:   Current Discharge Medication List    CONTINUE these medications which have CHANGED   Details  atorvastatin (LIPITOR) 80 MG tablet Take 1 tablet (80 mg total) by mouth daily. Qty: 90 tablet, Refills: 3    carvedilol (COREG) 3.125 MG tablet Take 1 tablet (3.125 mg total) by mouth 2 (two) times daily. Qty: 180 tablet, Refills: 3    losartan (COZAAR) 25 MG tablet Take 1 tablet (25 mg total) by mouth daily. Qty: 180 tablet, Refills: 3    spironolactone (ALDACTONE) 25 MG tablet Take 0.5 tablets (12.5 mg total)  by mouth daily. Qty: 45 tablet, Refills: 3      CONTINUE these medications which have NOT CHANGED   Details  aspirin EC 81 MG tablet Take 1 tablet (81 mg total) by mouth daily. Qty: 90 tablet, Refills: 3    clopidogrel (PLAVIX) 75 MG tablet Take 1 tablet (75 mg total) by mouth daily. Qty: 90 tablet, Refills: 3    famotidine (PEPCID) 20 MG tablet TAKE 1 TABLET (20 MG TOTAL) BY MOUTH 2 (TWO) TIMES DAILY. Qty: 30 tablet, Refills: 3   Associated Diagnoses: Gastroesophageal reflux disease, esophagitis presence not specified    nitroGLYCERIN (NITROSTAT) 0.4 MG SL tablet Place 1 tablet (0.4 mg total) under the tongue every 5 (five) minutes as needed for chest pain. Maximum of 3 doses. Qty: 45 tablet, Refills: 2      STOP taking these medications     LORazepam (ATIVAN) 0.5 MG tablet           Today   CHIEF COMPLAINT:  No CP overnight   VITAL SIGNS:  Blood pressure 97/60, pulse (!) 54, temperature 97.6 F (36.4 C), temperature source Oral, resp. rate 18, height 5\' 10"  (1.778 m), weight 91.1 kg (200 lb 14.4 oz), SpO2 97 %.   REVIEW OF SYSTEMS:  Review of Systems  Constitutional: Negative.  Negative for chills, fever and malaise/fatigue.  HENT: Negative.  Negative for ear discharge, ear pain, hearing loss, nosebleeds and sore throat.   Eyes: Negative.  Negative for blurred vision and pain.  Respiratory: Negative.  Negative  for cough, hemoptysis, shortness of breath and wheezing.   Cardiovascular: Negative.  Negative for chest pain, palpitations and leg swelling.  Gastrointestinal: Negative.  Negative for abdominal pain, blood in stool, diarrhea, nausea and vomiting.  Genitourinary: Negative.  Negative for dysuria.  Musculoskeletal: Negative.  Negative for back pain.  Skin: Negative.   Neurological: Negative for dizziness, tremors, speech change, focal weakness, seizures and headaches.  Endo/Heme/Allergies: Negative.  Does not bruise/bleed easily.  Psychiatric/Behavioral:  Negative.  Negative for depression, hallucinations and suicidal ideas.     PHYSICAL EXAMINATION:  GENERAL:  66 y.o.-year-old patient lying in the bed with no acute distress.  NECK:  Supple, no jugular venous distention. No thyroid enlargement, no tenderness.  LUNGS: Normal breath sounds bilaterally, no wheezing, rales,rhonchi  No use of accessory muscles of respiration.  CARDIOVASCULAR: S1, S2 normal. No murmurs, rubs, or gallops.  ABDOMEN: Soft, non-tender, non-distended. Bowel sounds present. No organomegaly or mass.  EXTREMITIES: No pedal edema, cyanosis, or clubbing.  PSYCHIATRIC: The patient is alert and oriented x 3.  SKIN: No obvious rash, lesion, or ulcer.   DATA REVIEW:   CBC  Recent Labs Lab 08/28/16 0034  WBC 5.7  HGB 13.6  HCT 39.6*  PLT 175    Chemistries   Recent Labs Lab 08/28/16 0034  NA 139  K 4.2  CL 106  CO2 27  GLUCOSE 96  BUN 15  CREATININE 0.81  CALCIUM 8.9    Cardiac Enzymes  Recent Labs Lab 08/27/16 0937 08/27/16 1230 08/28/16 0034  TROPONINI <0.03 <0.03 <0.03    Microbiology Results  @[email or web address] @  RADIOLOGY:  Dg Chest 2 View  Result Date: 08/27/2016 CLINICAL DATA:  Chest pain. EXAM: CHEST  2 VIEW COMPARISON:  None. FINDINGS: The cardiomediastinal silhouette is normal in size. Normal pulmonary vascularity. Atherosclerotic calcification of the aortic arch. Mild left basilar atelectasis. No focal consolidation, pleural effusion, or pneumothorax. No acute osseous abnormality. IMPRESSION: 1.  No active cardiopulmonary disease. 2.  Aortic Atherosclerosis (ICD10-I70.0). Electronically Signed   By: Titus Dubin M.D.   On: 08/27/2016 10:07   Ct Angio Chest Pe W And/or Wo Contrast  Result Date: 08/27/2016 CLINICAL DATA:  66 year old male with a history of indigestion EXAM: CT ANGIOGRAPHY CHEST WITH CONTRAST TECHNIQUE: Multidetector CT imaging of the chest was performed using the standard protocol during bolus administration of  intravenous contrast. Multiplanar CT image reconstructions and MIPs were obtained to evaluate the vascular anatomy. CONTRAST:  75 cc Isovue 370 COMPARISON:  None. FINDINGS: Cardiovascular: Heart: No cardiomegaly. No pericardial fluid/thickening. Calcifications/ stenting of the left anterior descending coronary artery. Calcifications of circumflex coronary artery and right coronary artery. Aorta: Unremarkable course, caliber, contour of the thoracic aorta. No aneurysm or dissection flap. No periaortic fluid. Pulmonary arteries: No central, lobar, segmental, or proximal subsegmental filling defects. Mediastinum/Nodes: Mediastinal lymph nodes are present, none of which are enlarged by CT size criteria. Unremarkable appearance of the thoracic esophagus. Unremarkable appearance of the thoracic inlet and thyroid. Lungs/Pleura: Central airways are clear. No pleural effusion. No confluent left-sided airspace disease. No pneumothorax. Upper Abdomen: Unremarkable. Musculoskeletal: No displaced fracture. Degenerative changes of the spine. Pectus deformity. Review of the MIP images confirms the above findings. IMPRESSION: CT is negative for pulmonary emboli.  No acute finding identified. Evidence of prior PCI and coronary artery disease. Electronically Signed   By: Corrie Mckusick D.O.   On: 08/27/2016 11:54      Current Discharge Medication List    CONTINUE these medications which have CHANGED  Details  atorvastatin (LIPITOR) 80 MG tablet Take 1 tablet (80 mg total) by mouth daily. Qty: 90 tablet, Refills: 3    carvedilol (COREG) 3.125 MG tablet Take 1 tablet (3.125 mg total) by mouth 2 (two) times daily. Qty: 180 tablet, Refills: 3    losartan (COZAAR) 25 MG tablet Take 1 tablet (25 mg total) by mouth daily. Qty: 180 tablet, Refills: 3    spironolactone (ALDACTONE) 25 MG tablet Take 0.5 tablets (12.5 mg total) by mouth daily. Qty: 45 tablet, Refills: 3      CONTINUE these medications which have NOT  CHANGED   Details  aspirin EC 81 MG tablet Take 1 tablet (81 mg total) by mouth daily. Qty: 90 tablet, Refills: 3    clopidogrel (PLAVIX) 75 MG tablet Take 1 tablet (75 mg total) by mouth daily. Qty: 90 tablet, Refills: 3    famotidine (PEPCID) 20 MG tablet TAKE 1 TABLET (20 MG TOTAL) BY MOUTH 2 (TWO) TIMES DAILY. Qty: 30 tablet, Refills: 3   Associated Diagnoses: Gastroesophageal reflux disease, esophagitis presence not specified    nitroGLYCERIN (NITROSTAT) 0.4 MG SL tablet Place 1 tablet (0.4 mg total) under the tongue every 5 (five) minutes as needed for chest pain. Maximum of 3 doses. Qty: 45 tablet, Refills: 2      STOP taking these medications     LORazepam (ATIVAN) 0.5 MG tablet            Management plans discussed with the patient and he is in agreement. Stable for discharge home  Patient should follow up with cardiology  CODE STATUS:     Code Status Orders        Start     Ordered   08/27/16 1654  Full code  Continuous     08/27/16 1653    Code Status History    Date Active Date Inactive Code Status Order ID Comments User Context   03/09/2016  4:42 PM 03/19/2016  9:39 PM Full Code [id number]  Gladstone Lighter, MD Inpatient   03/09/2016  4:42 PM 03/09/2016  4:42 PM Full Code [id number]  Nelva Bush, MD Inpatient      TOTAL TIME TAKING CARE OF THIS PATIENT: 38 minutes.    Note: This dictation was prepared with Dragon dictation along with smaller phrase technology. Any transcriptional errors that result from this process are unintentional.  Melayna Robarts M.D on 08/28/2016 at 8:44 AM  Between 7am to 6pm - Pager - [phone] After 6pm go to [email or web address] - password [email or web address] [facility]  Office  [phone]  CC: Primary care physician; Chrismon, Vickki Muff, PA

## 2016-08-28 NOTE — Consult Note (Signed)
Cardiology Consultation:   Patient ID: Edward [name]; [id number]; [birth year]   Admit date: 08/27/2016 Date of Consult: 08/28/2016  Primary Care Provider: Margo Common, PA Primary Cardiologist: End Consulting physician: Ether Griffins Reason for consult: Chest pain    Patient Profile:   Edward [name] is a 66 y.o. male with a  history of CAD anterior STEMI (late presentation) 03/2016  ischemic cardiomyopathy with severe LV dysfunction, apical thrombus,  Previously noted occasional "indegestion" and fatigue office visits in April and 08-10-16 most pronounced when he bends over Who presents with left lower chest pain, left upper epigastric discomfort, significant gas   History of Present Illness:   He reports that he was at cardiac rehabilitation and they were talking about anginal symptoms. Seemed to get him thinking about his chronic left lower chest pain, left upper quadrant discomfort. He's been having significant gas upper and lower. Trying to follow heart healthy diet which has resulted in significant periodic GI distress. He presented to the emergency room for further evaluation  Denies having this discomfort with exertion Recently noted the discomfort when using his lower, got off to moving some logs out of the way, seem to notice it then. Also noticing it this morning at rest   scheduled for stress test this morning  EKG unchanged, cardiac enzymes negative   Lab work reviewed with him, LDL 50  Past Medical History:  Diagnosis Date  . Anxiety   . Apical mural thrombus   . BPH (benign prostatic hyperplasia)   . CHF (congestive heart failure) ([place])   . Coronary artery disease   . Hyperlipidemia   . Hypertension     Past Surgical History:  Procedure Laterality Date  . CORONARY ANGIOGRAPHY N/A 03/09/2016   Procedure: Coronary Angiography;  Surgeon: Nelva Bush, MD;  Location: [facility];  Service: Cardiovascular;  Laterality: N/A;  . CORONARY STENT  INTERVENTION N/A 03/09/2016   Procedure: Coronary Stent Intervention;  Surgeon: Nelva Bush, MD;  Location: [facility];  Service: Cardiovascular;  Laterality: N/A;  . HERNIA REPAIR  2005  . HYDROCELE EXCISION       Inpatient Medications: Scheduled Meds: . aspirin EC  81 mg Oral Daily  . atorvastatin  80 mg Oral Daily  . carvedilol  3.125 mg Oral BID  . clopidogrel  75 mg Oral Daily  . enoxaparin (LOVENOX) injection  40 mg Subcutaneous Q24H  . famotidine  20 mg Oral BID  . pantoprazole  40 mg Oral Daily  . sodium chloride flush  3 mL Intravenous Q12H  . sucralfate  1 g Oral TID WC & HS   Continuous Infusions: . sodium chloride     PRN Meds: sodium chloride, gi cocktail, LORazepam, nitroGLYCERIN, ondansetron **OR** ondansetron (ZOFRAN) IV, sodium chloride flush  Allergies:   No Known Allergies  Social History:   Social History   Social History  . Marital status: Married    Spouse name: N/A  . Number of children: N/A  . Years of education: N/A   Occupational History  . Not on file.   Social History Main Topics  . Smoking status: Former Smoker    Start date: 07/03/2007  . Smokeless tobacco: Never Used     Comment: QUIT IN 2005  . Alcohol use 1.8 oz/week    3 Cans of beer per week  . Drug use: Yes    Types: Marijuana     Comment: smokes pot  . Sexual activity: Not on file  Other Topics Concern  . Not on file   Social History Narrative   Living with family, Independent at baseline    Family History:   The patient's family history includes Emphysema in his mother; Heart attack in his father and mother; Macular degeneration in his mother.  ROS:  Please see the history of present illness.  Review of Systems  Constitution: Negative for diaphoresis, fever, weakness, malaise/fatigue and night sweats.  HENT: Negative.   Eyes: Negative.   Cardiovascular: Positive for chest pain. Negative for claudication, cyanosis, dyspnea on exertion, irregular  heartbeat, leg swelling, near-syncope, orthopnea, palpitations and paroxysmal nocturnal dyspnea.  Respiratory: Negative for cough, shortness of breath, sleep disturbances due to breathing and wheezing.   Endocrine: Negative.   Hematologic/Lymphatic: Negative.   Skin: Negative.   Musculoskeletal: Negative for falls, joint pain, joint swelling and myalgias.  Gastrointestinal: Negative.   Neurological: Negative for difficulty with concentration, excessive daytime sleepiness, dizziness, focal weakness, light-headedness and numbness.  Psychiatric/Behavioral: The patient is nervous/anxious.     All other ROS reviewed and negative.     Physical Exam/Data:   Vitals:   08/27/16 1634 08/27/16 2028 08/27/16 2349 08/28/16 0412  BP: (!) 138/95 107/63 (!) 96/52 97/60  Pulse: (!) 59 (!) 58 (!) 52 (!) 54  Resp: 16 18  18   Temp:  98.4 F (36.9 C)  97.6 F (36.4 C)  TempSrc:  Oral  Oral  SpO2: 100% 95%  97%  Weight:    200 lb 14.4 oz (91.1 kg)  Height:        Intake/Output Summary (Last 24 hours) at 08/28/16 1209 Last data filed at 08/28/16 0818  Gross per 24 hour  Intake              240 ml  Output                0 ml  Net              240 ml   Filed Weights   08/27/16 0937 08/28/16 0412  Weight: 202 lb (91.6 kg) 200 lb 14.4 oz (91.1 kg)   Body mass index is 28.83 kg/m.  General:  Well nourished, well developed, in no acute distress HEENT: normal Lymph: no adenopathy Neck: no JVD Endocrine:  No thryomegaly Vascular: No carotid bruits; FA pulses 2+ bilaterally without bruits  Cardiac:  normal S1, S2; RRR; no murmur  Lungs:  clear to auscultation bilaterally, no wheezing, rhonchi or rales  Abd: soft, nontender, no hepatomegaly  Ext: no edema Musculoskeletal:  No deformities, BUE and BLE strength normal and equal Skin: warm and dry  Neuro:  CNs 2-12 intact, no focal abnormalities noted Psych:  Normal affect   EKG:  The EKG was personally reviewed and demonstrates:  Normal sinus  rhythm with rate 56 bpm, right bundle branch block, old anterior MI, left anterior sixth of block  Telemetry:  Telemetry was personally reviewed and demonstrates:  Normal sinus rhythm  Relevant CV Studies: Stress test pending   Laboratory Data:  Chemistry Recent Labs Lab 08/27/16 0937 08/28/16 0034  NA 138 139  K 4.0 4.2  CL 105 106  CO2 26 27  GLUCOSE 114* 96  BUN 18 15  CREATININE 0.87 0.81  CALCIUM 9.3 8.9  GFRNONAA >60 >60  GFRAA >60 >60  ANIONGAP 7 6    No results for input(s): PROT, ALBUMIN, AST, ALT, ALKPHOS, BILITOT in the last 168 hours. Hematology Recent Labs Lab 08/27/16 [phone] 08/28/16 0034  WBC 5.2 5.7  RBC 4.52 4.11*  HGB 14.9 13.6  HCT 43.7 39.6*  MCV 96.7 96.2  MCH 32.9 33.2  MCHC 34.0 34.5  RDW 13.1 13.3  PLT 195 175   Cardiac Enzymes Recent Labs Lab 08/27/16 0937 08/27/16 1230 08/28/16 0034  TROPONINI <0.03 <0.03 <0.03   No results for input(s): TROPIPOC in the last 168 hours.  BNPNo results for input(s): BNP, PROBNP in the last 168 hours.  DDimer No results for input(s): DDIMER in the last 168 hours.  Radiology/Studies:  Dg Chest 2 View  Result Date: 08/27/2016 CLINICAL DATA:  Chest pain. EXAM: CHEST  2 VIEW COMPARISON:  None. FINDINGS: The cardiomediastinal silhouette is normal in size. Normal pulmonary vascularity. Atherosclerotic calcification of the aortic arch. Mild left basilar atelectasis. No focal consolidation, pleural effusion, or pneumothorax. No acute osseous abnormality. IMPRESSION: 1.  No active cardiopulmonary disease. 2.  Aortic Atherosclerosis (ICD10-I70.0). Electronically Signed   By: Titus Dubin M.D.   On: 08/27/2016 10:07   Ct Angio Chest Pe W And/or Wo Contrast  Result Date: 08/27/2016 CLINICAL DATA:  66 year old male with a history of indigestion EXAM: CT ANGIOGRAPHY CHEST WITH CONTRAST TECHNIQUE: Multidetector CT imaging of the chest was performed using the standard protocol during bolus administration of  intravenous contrast. Multiplanar CT image reconstructions and MIPs were obtained to evaluate the vascular anatomy. CONTRAST:  75 cc Isovue 370 COMPARISON:  None. FINDINGS: Cardiovascular: Heart: No cardiomegaly. No pericardial fluid/thickening. Calcifications/ stenting of the left anterior descending coronary artery. Calcifications of circumflex coronary artery and right coronary artery. Aorta: Unremarkable course, caliber, contour of the thoracic aorta. No aneurysm or dissection flap. No periaortic fluid. Pulmonary arteries: No central, lobar, segmental, or proximal subsegmental filling defects. Mediastinum/Nodes: Mediastinal lymph nodes are present, none of which are enlarged by CT size criteria. Unremarkable appearance of the thoracic esophagus. Unremarkable appearance of the thoracic inlet and thyroid. Lungs/Pleura: Central airways are clear. No pleural effusion. No confluent left-sided airspace disease. No pneumothorax. Upper Abdomen: Unremarkable. Musculoskeletal: No displaced fracture. Degenerative changes of the spine. Pectus deformity. Review of the MIP images confirms the above findings. IMPRESSION: CT is negative for pulmonary emboli.  No acute finding identified. Evidence of prior PCI and coronary artery disease. Electronically Signed   By: Corrie Mckusick D.O.   On: 08/27/2016 11:54    Assessment and Plan:   1. Chest pain Known history of coronary artery disease, previous STEMI with ischemic cardiopathy   chest pain is atypical, per previous notes reviewed by Dr. Saunders Revel, he has had similar symptoms over the past several months, atypical, worse sometimes with bending over Suspect anxiety playing a role in his concern. Patient even admits to this. Reassuring that EKG and enzymes are normal CT scan with no PE Stress test scheduled for this morning to rule out ischemia For now would recommend we continue current outpatient regimen:  aspirin, statin, Plavix, beta blocker Other differential includes  GI/bowel gas, musculoskeletal  2) indigestion Recommended he try Gas-X, carbonated soda for belching, Try to decrease foods contributing to gas problem CT scan for nuclear stress test did document bowel gas  3) anxiety Would recommend he continue with cardiac rehabilitation for reassurance in stable anginal symptoms  4) ischemic cardiomyopathy Previous ejection fraction 35-40%, anterior wall hypokinesis No evidence of apical thrombus on that study    Total encounter time more than 110 minutes  Greater than 50% was spent in counseling and coordination of care with the patient   Signed, Ida Rogue,  MD  08/28/2016 12:09 PM

## 2016-08-30 ENCOUNTER — Telehealth: Payer: Self-pay | Admitting: *Deleted

## 2016-08-30 NOTE — Telephone Encounter (Signed)
No answer. Left message to call back.  Patient needs to be rescheduled for sooner appt.

## 2016-08-30 NOTE — Telephone Encounter (Signed)
-----   Message from Blain Pais sent at 08/30/2016  9:25 AM EDT ----- Regarding: tcm/;ph 8/22 10:20 Dr. Saunders Revel

## 2016-08-30 NOTE — Telephone Encounter (Signed)
Patient contacted regarding discharge from [facility] on 08/28/16. Rescheduled upcoming appt to be sooner.   Patient understands to follow up with provider ? On 09/07/16 at 2:40 pm at [facility].  Patient understands discharge instructions? Yes Patient understands medications and regiment? Yes Patient understands to bring all medications to this visit? Yes

## 2016-08-31 ENCOUNTER — Ambulatory Visit: Payer: Commercial Managed Care - PPO

## 2016-08-31 VITALS — Ht 70.0 in | Wt 205.5 lb

## 2016-08-31 DIAGNOSIS — I252 Old myocardial infarction: Secondary | ICD-10-CM | POA: Diagnosis not present

## 2016-08-31 DIAGNOSIS — Z955 Presence of coronary angioplasty implant and graft: Secondary | ICD-10-CM

## 2016-08-31 DIAGNOSIS — I213 ST elevation (STEMI) myocardial infarction of unspecified site: Secondary | ICD-10-CM

## 2016-08-31 NOTE — Progress Notes (Signed)
Daily Session Note  Patient Details  Name: Edward [name] MRN: [id number] Date of Birth: 09-08-1950 Referring Provider:     Cardiac Rehab from 06/21/2016 in [facility] Cardiac [facility] Pul[facility] Re[facility]  Referring Provider  End, Harrell Gave MD      Encounter Date: 08/31/2016  Check In:     Session Check In - 08/31/16 0844      Check-In   Location [facility]-Cardiac & Pulmonary Rehab   Staff Present Gerlene Burdock, RN, Vickki Hearing, BA, ACSM CEP, Exercise Physiologist;Krista Frederico Hamman, RN BSN   Supervising physician immediately available to respond to emergencies See telemetry face sheet for immediately available ER MD   Medication changes reported     No   Fall or balance concerns reported    No   Warm-up and Cool-down Performed on first and last piece of equipment   Resistance Training Performed Yes   VAD Patient? No     Pain Assessment   Currently in Pain? No/denies         History  Smoking Status  . Former Smoker  . Start date: 07/03/2007  Smokeless Tobacco  . Never Used    Comment: QUIT IN 2005    Goals Met:  Independence with exercise equipment Exercise tolerated well No report of cardiac concerns or symptoms Strength training completed today  Goals Unmet:  Not Applicable  Comments:      6 Minute Walk    Row Name 06/21/16 1443 08/31/16 0943       6 Minute Walk   Phase Initial Discharge    Distance 1375 feet 1570 feet    Walk Time 6 minutes 6 minutes    # of Rest Breaks 0 0    MPH 2.6 2.97    METS 3.31 3.57    RPE 10 12    VO2 Peak 11.6 12.48    Symptoms No No    Resting HR 52 bpm 58 bpm    Resting BP 108/62  r arm 106/64 110/60    Max Ex. HR 101 bpm 97 bpm    Max Ex. BP 134/66 128/70    2 Minute Post BP 114/60  -         Dr. Emily Filbert is Medical Director for [facility].

## 2016-09-02 ENCOUNTER — Ambulatory Visit: Payer: Commercial Managed Care - PPO

## 2016-09-02 ENCOUNTER — Encounter: Payer: Commercial Managed Care - PPO | Attending: Cardiovascular Disease

## 2016-09-02 DIAGNOSIS — I252 Old myocardial infarction: Secondary | ICD-10-CM | POA: Insufficient documentation

## 2016-09-02 DIAGNOSIS — I213 ST elevation (STEMI) myocardial infarction of unspecified site: Secondary | ICD-10-CM

## 2016-09-02 DIAGNOSIS — Z955 Presence of coronary angioplasty implant and graft: Secondary | ICD-10-CM | POA: Diagnosis not present

## 2016-09-02 NOTE — Progress Notes (Signed)
Daily Session Note  Patient Details  Name: Edward [name] MRN: [id number] Date of Birth: [birth year] Referring Provider:     Cardiac Rehab from 06/21/2016 in [facility] Cardiac and Pulmonary Rehab  Referring Provider  End, Harrell Gave MD      Encounter Date: 09/02/2016  Check In:     Session Check In - 09/02/16 0848      Check-In   Location [facility]-Cardiac & Pulmonary Rehab   Staff Present Alberteen Sam, MA, ACSM RCEP, Exercise Physiologist;Amanda Oletta Darter, BA, ACSM CEP, Exercise Physiologist;Meredith Sherryll Burger, RN BSN   Supervising physician immediately available to respond to emergencies See telemetry face sheet for immediately available ER MD   Medication changes reported     No   Fall or balance concerns reported    No   Warm-up and Cool-down Performed on first and last piece of equipment   Resistance Training Performed Yes   VAD Patient? No     Pain Assessment   Currently in Pain? No/denies         History  Smoking Status  . Former Smoker  . Start date: 07/03/2007  Smokeless Tobacco  . Never Used    Comment: QUIT IN 2005    Goals Met:  Independence with exercise equipment Exercise tolerated well No report of cardiac concerns or symptoms Strength training completed today  Goals Unmet:  Not Applicable  Comments: Pt able to follow exercise prescription today without complaint.  Will continue to monitor for progression.    Dr. Emily Filbert is Medical Director for [facility].

## 2016-09-07 ENCOUNTER — Encounter: Payer: Self-pay | Admitting: Internal Medicine

## 2016-09-07 ENCOUNTER — Ambulatory Visit (INDEPENDENT_AMBULATORY_CARE_PROVIDER_SITE_OTHER): Payer: Commercial Managed Care - PPO | Admitting: Internal Medicine

## 2016-09-07 ENCOUNTER — Ambulatory Visit: Payer: Commercial Managed Care - PPO

## 2016-09-07 ENCOUNTER — Encounter: Payer: Commercial Managed Care - PPO | Admitting: *Deleted

## 2016-09-07 VITALS — BP 102/62 | HR 53 | Ht 70.0 in | Wt 207.0 lb

## 2016-09-07 DIAGNOSIS — E785 Hyperlipidemia, unspecified: Secondary | ICD-10-CM

## 2016-09-07 DIAGNOSIS — I252 Old myocardial infarction: Secondary | ICD-10-CM | POA: Diagnosis not present

## 2016-09-07 DIAGNOSIS — I25118 Atherosclerotic heart disease of native coronary artery with other forms of angina pectoris: Secondary | ICD-10-CM

## 2016-09-07 DIAGNOSIS — I5022 Chronic systolic (congestive) heart failure: Secondary | ICD-10-CM | POA: Diagnosis not present

## 2016-09-07 DIAGNOSIS — Z955 Presence of coronary angioplasty implant and graft: Secondary | ICD-10-CM

## 2016-09-07 DIAGNOSIS — I255 Ischemic cardiomyopathy: Secondary | ICD-10-CM

## 2016-09-07 DIAGNOSIS — I213 ST elevation (STEMI) myocardial infarction of unspecified site: Secondary | ICD-10-CM

## 2016-09-07 NOTE — Patient Instructions (Signed)
Medication Instructions:  Your physician recommends that you continue on your current medications as directed. Please refer to the Current Medication list given to you today.   Labwork: none  Testing/Procedures: none  Follow-Up: Your physician recommends that you schedule a follow-up appointment in: 2 MONTHS WITH DR END.  If you need a refill on your cardiac medications before your next appointment, please call your pharmacy.

## 2016-09-07 NOTE — Progress Notes (Signed)
Daily Session Note  Patient Details  Name: Edward [name] MRN: [id number] Date of Birth: 04/19/1950 Referring Provider:     Cardiac Rehab from 06/21/2016 in [facility] Cardiac and Pulmonary Rehab  Referring Provider  End, Harrell Gave MD      Encounter Date: 09/07/2016  Check In:     Session Check In - 09/07/16 0814      Check-In   Location [facility]-Cardiac & Pulmonary Rehab   Staff Present Heath Lark, RN, BSN, CCRP;Cameryn Chrisley Luan Pulling, MA, ACSM RCEP, Exercise Physiologist;Krista Frederico Hamman, RN BSN   Supervising physician immediately available to respond to emergencies See telemetry face sheet for immediately available ER MD   Medication changes reported     No   Fall or balance concerns reported    No   Warm-up and Cool-down Performed on first and last piece of equipment   Resistance Training Performed Yes   VAD Patient? No     Pain Assessment   Currently in Pain? No/denies   Multiple Pain Sites No         History  Smoking Status  . Former Smoker  . Start date: 07/03/2007  Smokeless Tobacco  . Never Used    Comment: QUIT IN 2005    Goals Met:  Independence with exercise equipment Exercise tolerated well No report of cardiac concerns or symptoms Strength training completed today  Goals Unmet:  Not Applicable  Comments: Pt able to follow exercise prescription today without complaint.  Will continue to monitor for progression.    Dr. Emily Filbert is Medical Director for [facility].

## 2016-09-07 NOTE — Progress Notes (Signed)
Follow-up Outpatient Visit Date: 09/07/2016  Primary Care Provider: Margo Common, [place] [address]  Chief Complaint: Chest pain  HPI:  Edward [name] is a 66 y.o. year-old male with history of coronary artery disease status post anterior STEMI (late presentation) in 03/2295, chronic systolic heart failure secondary to schema cardiomyopathy complicated by apical thrombus (resolved following anticoagulation 3 months), hypertension, and hyperlipidemia, who presents for follow-up of coronary artery disease. He was hospitalized last month for an episode of recurrent chest pain that was reminiscent of his MI in February. He has experienced intermittent burning in his chest, but noted it to be more severe persistent that day. EKG in the emergency department was stable. Edward [name] was ruled out by troponins and subsequently underwent cardiac perfusion stress test that showed no ischemia, only mid and apical anteroseptal and apical scar consistent with his prior MI.  Today, Edward [name] reports feeling well., He has continued to have occasional gas-like pains underneath the left inferior costal margin. He has tried Mylanta without improvement. However, he recently began using simethicone with some relief. He is unsure if this could be pain related to his heart or GI tract. The discomfort is nonexertional. He has not taken any sublingual nitroglycerin. He is trying to stay active. He has also been watching his diet, trying to avoid fatty and salty foods when possible. He still drinks a few beers from time to time. He has not had any shortness of breath, palpitations, lightheadedness, or edema. Edward [name] remains compliant with his medications, including dual antiplatelet therapy with aspirin and clopidogrel. He denies bleeding.  --------------------------------------------------------------------------------------------------  Cardiovascular History & Procedures: Cardiovascular  Problems:  CAD s/p anterior STEMI with PCI to proximal LAD (03/2016)  Ischemic cardiomyopathy with severe LV dysfunction  LV apical thrombus  Risk Factors:  Known CAD, HTN, hyperlipidemia, male gender, and age > 91  Cath/PCI:  LHC/PCI (03/09/16): Late presenting STEMI with thrombotic occlusion of the proximal/mid LAD involving D1 and D2. Mild to moderate, non-obstructive CAD involving LCx and RCA. Successful IVUS-guided PCI to proximal and mid LAD using Integrity Resolute 3.0 x 38 mm DES (post-dilated up to 4.0 mm) with 0% residual stenosis and TIMI-3 flow. D2 was jailed by the LAD stent resulting in 70% ostial stenosis with TIMI-3 flow.  CV Surgery:  None  EP Procedures and Devices:  None  Non-Invasive Evaluation(s):  Pharmacologic MPI (08/28/16): Low to intermediate risk scan without any BM. There is a moderate in size, fixed mid and apical anteroseptal and apical defect. LVEF is 45% with apical anteroseptal and apical hypokinesis.  TTE (06/15/16): Normal LV size with mild LVH. LVEF 35-40% with mid and apical anterior/anteroseptal and apical akinesis. No evidence of LV thrombus. Grade 1 diastolic dysfunction. Mild biatrial enlargement. Normal RV size and function.  TTE (03/10/16): Moderate to severe LV dysfunction with LAD territory akinesis (LVEF 30-35%). Likely mural thrombus at LV apex. Limited evaluation of cardiac valve due to lack of parasternal windows; no significant valvular abnormality identified. Normal RV size and function.  Recent CV Pertinent Labs: Lab Results  Component Value Date   CHOL 104 05/26/2016   HDL 38 (L) 05/26/2016   LDLCALC 50 05/26/2016   TRIG 81 05/26/2016   CHOLHDL 4.0 03/09/2016   INR 4.1 06/16/2016   INR 2.31 03/19/2016   K 4.2 08/28/2016   MG 1.9 03/10/2016   BUN 15 08/28/2016   BUN 16 05/26/2016   CREATININE 0.81 08/28/2016    Past medical and  surgical history were reviewed and updated in EPIC.  Current Meds  Medication Sig  .  aspirin EC 81 MG tablet Take 1 tablet (81 mg total) by mouth daily.  Marland Kitchen atorvastatin (LIPITOR) 80 MG tablet Take 1 tablet (80 mg total) by mouth daily.  . carvedilol (COREG) 3.125 MG tablet Take 1 tablet (3.125 mg total) by mouth 2 (two) times daily.  . clopidogrel (PLAVIX) 75 MG tablet Take 1 tablet (75 mg total) by mouth daily.  . famotidine (PEPCID) 20 MG tablet TAKE 1 TABLET (20 MG TOTAL) BY MOUTH 2 (TWO) TIMES DAILY.  Marland Kitchen losartan (COZAAR) 25 MG tablet Take 1 tablet (25 mg total) by mouth daily.  . nitroGLYCERIN (NITROSTAT) 0.4 MG SL tablet Place 1 tablet (0.4 mg total) under the tongue every 5 (five) minutes as needed for chest pain. Maximum of 3 doses.  Marland Kitchen omega-3 acid ethyl esters (LOVAZA) 1 g capsule Take by mouth 2 (two) times daily.  Marland Kitchen spironolactone (ALDACTONE) 25 MG tablet Take 0.5 tablets (12.5 mg total) by mouth daily.    Allergies: Patient has no known allergies.  Social History   Social History  . Marital status: Married    Spouse name: N/A  . Number of children: N/A  . Years of education: N/A   Occupational History  . Not on file.   Social History Main Topics  . Smoking status: Former Smoker    Start date: 07/03/2007  . Smokeless tobacco: Never Used     Comment: QUIT IN 2005  . Alcohol use 1.8 oz/week    3 Cans of beer per week  . Drug use: Yes    Types: Marijuana     Comment: smokes pot  . Sexual activity: Not on file   Other Topics Concern  . Not on file   Social History Narrative   Living with family, Independent at baseline    Family History  Problem Relation Age of Onset  . Emphysema Mother   . Macular degeneration Mother   . Heart attack Mother   . Heart attack Father     Review of Systems: A 12-system review of systems was performed and was negative except as noted in the HPI.  --------------------------------------------------------------------------------------------------  Physical Exam: BP 102/62 (BP Location: Left Arm, Patient Position:  Sitting, Cuff Size: Normal)   Pulse (!) 53   Ht 5\' 10"  (1.778 m)   Wt 207 lb (93.9 kg)   BMI 29.70 kg/m  Repeat pulse: 60 bpm  General:  Overweight man, seated comfortably in the exam room. HEENT: No conjunctival pallor or scleral icterus. Moist mucous membranes.  OP clear. Neck: Supple without lymphadenopathy, thyromegaly, JVD, or HJR. No carotid bruit. Lungs: Normal work of breathing. Clear to auscultation bilaterally without wheezes or crackles. Heart: Regular rate and rhythm without murmurs, rubs, or gallops. Non-displaced PMI. Abd: Bowel sounds present. Soft, NT/ND without hepatosplenomegaly Ext: No lower extremity edema. Radial, PT, and DP pulses are 2+ bilaterally. Skin: Warm and dry without rash.  EKG:  Sinus bradycardia (heart rate 53 bpm) with first-degree AV block and left axis deviation. Nonspecific intraventricular conduction delay with septal Q waves. No significant change since 05/12/16.  Lab Results  Component Value Date   WBC 5.7 08/28/2016   HGB 13.6 08/28/2016   HCT 39.6 (L) 08/28/2016   MCV 96.2 08/28/2016   PLT 175 08/28/2016    Lab Results  Component Value Date   NA 139 08/28/2016   K 4.2 08/28/2016   CL 106 08/28/2016  CO2 27 08/28/2016   BUN 15 08/28/2016   CREATININE 0.81 08/28/2016   GLUCOSE 96 08/28/2016   ALT 21 05/26/2016    Lab Results  Component Value Date   CHOL 104 05/26/2016   HDL 38 (L) 05/26/2016   LDLCALC 50 05/26/2016   TRIG 81 05/26/2016   CHOLHDL 4.0 03/09/2016   --------------------------------------------------------------------------------------------------  ASSESSMENT AND PLAN: Coronary artery disease with atypical angina Edward [name] continues to have occasional gassy and burning chest discomfort, which is atypical but at least somewhat similar to what he experienced at the time of his STEMI in February. Recent hospitalization was notable for negative troponins and myocardial perfusion stress test demonstrating evidence of  prior LAD infarct but no new ischemia. I reassured Edward [name] that his symptoms are atypical and recent hospital workup reassuring. I encouraged him to continue using famotidine and simethicone. He can also try acetaminophen for possible musculoskeletal pain, as he has chronic deformity of the rib cage. We will continue with dual antiplatelet therapy through at least February, ideally longer if tolerated. We will also continue current doses of atorvastatin and carvedilol. If his atypical chest discomfort continues, we may need to consider adding a long-acting nitrate or ranolazine. He should continue with cardiac rehab.  Chronic systolic heart failure secondary to ischemic cardiomyopathy Edward [name] appears euvolemic and well compensated with NYHA class II symptoms. LVEF by myocardial perfusion stress test last month was up to 45%. We will continue Edward [name] current medications. Unfortunately, I am hesitant to uptitrate his carvedilol and losartan further secondary to bradycardia and soft blood pressure. Renal function and potassium were normal on 08/28/16.  Hyperlipidemia Edward [name] is tolerating high intensity statin therapy with atorvastatin well. LDL in April was at goal (less than 70). We will not make any medication changes at this time.  Follow-up: Return to clinic in 2 months.  Nelva Bush, MD 09/07/2016 3:08 PM

## 2016-09-09 ENCOUNTER — Encounter: Payer: Commercial Managed Care - PPO | Admitting: *Deleted

## 2016-09-09 ENCOUNTER — Ambulatory Visit: Payer: Commercial Managed Care - PPO

## 2016-09-09 DIAGNOSIS — I213 ST elevation (STEMI) myocardial infarction of unspecified site: Secondary | ICD-10-CM

## 2016-09-09 DIAGNOSIS — I252 Old myocardial infarction: Secondary | ICD-10-CM | POA: Diagnosis not present

## 2016-09-09 DIAGNOSIS — Z955 Presence of coronary angioplasty implant and graft: Secondary | ICD-10-CM

## 2016-09-09 NOTE — Progress Notes (Signed)
Discharge Summary  Patient Details  Name: Edward [name] MRN: [id number] Date of Birth: 10/03/1950 Referring Provider:     Cardiac Rehab from 06/21/2016 in [facility] Cardiac and Pulmonary Rehab  Referring Provider  End, Harrell Gave MD       Number of Visits: 36/36  Reason for Discharge:  Patient reached a stable level of exercise. Patient independent in their exercise.  Smoking History:  History  Smoking Status  . Former Smoker  . Start date: 07/03/2007  Smokeless Tobacco  . Never Used    Comment: QUIT IN 2005    Diagnosis:  ST elevation myocardial infarction (STEMI), unspecified artery ([place])  Status post coronary artery stent placement  ADL [facility]SD:   Initial Exercise Prescription:     Initial Exercise Prescription - 06/21/16 1400      Date of Initial Exercise RX and Referring Provider   Date 06/21/16   Referring Provider End, Harrell Gave MD     Treadmill   MPH 2.6   Grade 0.5   Minutes 15   METs 3.17     REL-XR   Level 2   Speed 50   Minutes 15   METs 2     T5 Nustep   Level 3   SPM 80   Minutes 15   METs 2     Prescription Details   Frequency (times per week) 2   Duration Progress to 45 minutes of aerobic exercise without signs/symptoms of physical distress     Intensity   THRR 40-80% of Max Heartrate 94-135   Ratings of Perceived Exertion 11-13   Perceived Dyspnea 0-4     Progression   Progression Continue to progress workloads to maintain intensity without signs/symptoms of physical distress.     Resistance Training   Training Prescription Yes   Weight 3 lbs   Reps 10-15      Discharge Exercise Prescription (Final Exercise Prescription Changes):     Exercise Prescription Changes - 08/25/16 1500      Response to Exercise   Blood Pressure (Admit) 104/60   Blood Pressure (Exercise) 136/70   Blood Pressure (Exit) 134/70   Heart Rate (Admit) 73 bpm   Heart Rate (Exercise) 109 bpm   Heart Rate (Exit) 57 bpm   Rating of Perceived Exertion  (Exercise) 13   Symptoms none   Duration Continue with 45 min of aerobic exercise without signs/symptoms of physical distress.   Intensity THRR unchanged     Progression   Progression Continue to progress workloads to maintain intensity without signs/symptoms of physical distress.   Average METs 4.1     Resistance Training   Training Prescription Yes   Weight 6 lb   Reps 10-15     Interval Training   Interval Training No     Treadmill   MPH 2.8   Grade 2.5   Minutes 15   METs 4.11     REL-XR   Level 5   Minutes 15   METs 5.4     T5 Nustep   Level 4   Minutes 15   METs 2.7     Home Exercise Plan   Plans to continue exercise at Home (comment)  walking and biking   Frequency Add 2 additional days to program exercise sessions.   Initial Home Exercises Provided 07/01/16      Functional Capacity:     6 Minute Walk    Row Name 06/21/16 1443 08/31/16 0943 08/31/16 1012     6 Minute Walk  Phase Initial Discharge Discharge   Distance 1375 feet 1570 feet  -   Distance % Change  -  - 14 %  195 ft   Walk Time 6 minutes 6 minutes  -   # of Rest Breaks 0 0  -   MPH 2.6 2.97  -   METS 3.31 3.57  -   RPE 10 12  -   VO2 Peak 11.6 12.48  -   Symptoms No No  -   Resting HR 52 bpm 58 bpm  -   Resting BP 108/62  r arm 106/64 110/60  -   Max Ex. HR 101 bpm 97 bpm  -   Max Ex. BP 134/66 128/70  -   2 Minute Post BP 114/60  -  -      Psychological, QOL, Others - Outcomes: PHQ 2/9: Depression screen [facility] 2/9 09/09/2016 06/21/2016 12/16/2015  Decreased Interest 0 0 0  Down, Depressed, Hopeless 1 0 0  PHQ - 2 Score 1 0 0  Altered sleeping 0 1 -  Tired, decreased energy 1 1 -  Change in appetite 0 0 -  Feeling bad or failure about yourself  1 0 -  Trouble concentrating 0 0 -  Moving slowly or fidgety/restless 0 0 -  Suicidal thoughts 0 0 -  PHQ-9 Score 3 2 -    Quality of Life:     Quality of Life - 09/09/16 0828      Quality of Life Scores   Health/Function  Pre 21.17 %   Health/Function Post 20.7 %   Health/Function % Change -2.22 %   Socioeconomic Pre 24.21 %   Socioeconomic Post 22.5 %   Socioeconomic % Change  -7.06 %   Psych/Spiritual Pre 22.93 %   Psych/Spiritual Post 22.71 %   Psych/Spiritual % Change -0.96 %   Family Pre 16.38 %   Family Post 22.5 %   Family % Change 37.36 %   GLOBAL Pre 21.61 %   GLOBAL Post 21.77 %   GLOBAL % Change 0.74 %      Personal Goals: Goals established at orientation with interventions provided to work toward goal.     Personal Goals and Risk Factors at Admission - 06/21/16 1340      Core Components/Risk Factors/Patient Goals on Admission    Weight Management Yes;Weight Loss   Intervention Weight Management: Develop a combined nutrition and exercise program designed to reach desired caloric intake, while maintaining appropriate intake of nutrient and fiber, sodium and fats, and appropriate energy expenditure required for the weight goal.;Weight Management: Provide education and appropriate resources to help participant work on and attain dietary goals.   Admit Weight 205 lb 4.8 oz (93.1 kg)   Goal Weight: Short Term 200 lb (90.7 kg)   Goal Weight: Long Term 176 lb (79.8 kg)   Expected Outcomes Short Term: Continue to assess and modify interventions until short term weight is achieved;Long Term: Adherence to nutrition and physical activity/exercise program aimed toward attainment of established weight goal;Weight Loss: Understanding of general recommendations for a balanced deficit meal plan, which promotes 1-2 lb weight loss per week and includes a negative energy balance of [phone] kcal/d;Understanding recommendations for meals to include 15-35% energy as protein, 25-35% energy from fat, 35-60% energy from carbohydrates, less than 200mg  of dietary cholesterol, 20-35 gm of total fiber daily;Understanding of distribution of calorie intake throughout the day with the consumption of 4-5 meals/snacks    Tobacco Cessation --  quit  2009   Hypertension Yes   Intervention Monitor prescription use compliance.;Provide education on lifestyle modifcations including regular physical activity/exercise, weight management, moderate sodium restriction and increased consumption of fresh fruit, vegetables, and low fat dairy, alcohol moderation, and smoking cessation.   Expected Outcomes Short Term: Continued assessment and intervention until BP is < 140/3mm HG in hypertensive participants. < 130/14mm HG in hypertensive participants with diabetes, heart failure or chronic kidney disease.;Long Term: Maintenance of blood pressure at goal levels.   Lipids Yes   Intervention Provide education and support for participant on nutrition & aerobic/resistive exercise along with prescribed medications to achieve LDL 70mg , HDL >40mg .   Expected Outcomes Short Term: Participant states understanding of desired cholesterol values and is compliant with medications prescribed. Participant is following exercise prescription and nutrition guidelines.;Long Term: Cholesterol controlled with medications as prescribed, with individualized exercise RX and with personalized nutrition plan. Value goals: LDL < 70mg , HDL > 40 mg.   Stress Yes  Mom lives next door. Stressful when he goes to visit her. Had his heart attack at her house.   Intervention Offer individual and/or small group education and counseling on adjustment to heart disease, stress management and health-related lifestyle change. Teach and support self-help strategies.;Refer participants experiencing significant psychosocial distress to appropriate mental health specialists for further evaluation and treatment. When possible, include family members and significant others in education/counseling sessions.   Expected Outcomes Short Term: Participant demonstrates changes in health-related behavior, relaxation and other stress management skills, ability to obtain effective social  support, and compliance with psychotropic medications if prescribed.;Long Term: Emotional wellbeing is indicated by absence of clinically significant psychosocial distress or social isolation.       Personal Goals Discharge:     Goals and Risk Factor Review    Row Name 07/13/16 1312 08/05/16 0910 08/31/16 1006 08/31/16 1016       Core Components/Risk Factors/Patient Goals Review   Personal Goals Review Weight Management/Obesity;Hypertension;Diabetes;Lipids Weight Management/Obesity;Hypertension;Diabetes;Lipids  -  -    Review Edward [name] has been doing well in rehab.  His weight has been holding steady around 205-207 lbs.  He wants to get more weight off and realizes that exercising more at home will help.  He has done well wiht his blood pressure and blood sugars.  He checks his sugars regularly, but only does his pressures while in class.  We discussed getting a cuff for home measurements.  He has a follow appointment later this week and is planning to ask about his lipids  then. Edward [name]'s weight has been holding steady around 200lbs.  He would like to break through that barrier.  He continues to do well with is sugars and pressures.  He does not check his pressure at home as even just going to check at [facility] on the automatic cuff makes him nervous. He continues to do well with his medications as well.  eating 1800 calories/day. Having problems with gas and gas x is helping. [place] stress test was done on Saturday and to see Dr End now to see him next Tuesday or Thursday.  Weight is stable.     Expected Outcomes Short: Ask about a lipid recheck and start to exercise more at home.  Long:  Continue to work on weight loss.  Short: Break through 200 lbs barrier.  Long: Continue to work on risk factor modification  - Heart healthy lifestyle       Nutrition & Weight - Outcomes:     Pre Biometrics - 06/21/16 1446  Pre Biometrics   Height 5\' 10"  (1.778 m)   Weight 205 lb 4.8 oz (93.1 kg)   Waist  Circumference 41 inches   Hip Circumference 42 inches   Waist to Hip Ratio 0.98 %   BMI (Calculated) 29.5   Single Leg Stand 15.39 seconds         Post Biometrics - 08/31/16 0942       Post  Biometrics   Height 5\' 10"  (1.778 m)   Weight 205 lb 8 oz (93.2 kg)   Waist Circumference 40 inches   Hip Circumference 42 inches   Waist to Hip Ratio 0.95 %   BMI (Calculated) 29.5   Single Leg Stand 30 seconds      Nutrition:     Nutrition Therapy & Goals - 08/05/16 0854      Nutrition Therapy   RD appointment defered --      Nutrition Discharge:     Nutrition Assessments - 09/09/16 0828      MEDFICTS Scores   Pre Score 40   Post Score 12   Score Difference -28      Education Questionnaire Score:     Knowledge Questionnaire Score - 09/09/16 0828      Knowledge Questionnaire Score   Pre Score 21/28   Post Score 27/28      Goals reviewed with patient; copy given to patient.

## 2016-09-09 NOTE — Patient Instructions (Signed)
Discharge Instructions  Patient Details  Name: Edward [name] MRN: [id number] Date of Birth: 10-03-1950 Referring Provider:  Minna Merritts, MD   Number of Visits: 36/36  Reason for Discharge:  Patient reached a stable level of exercise. Patient independent in their exercise.  Smoking History:  History  Smoking Status  . Former Smoker  . Start date: 07/03/2007  Smokeless Tobacco  . Never Used    Comment: QUIT IN 2005    Diagnosis:  ST elevation myocardial infarction (STEMI), unspecified artery ([place])  Status post coronary artery stent placement  Initial Exercise Prescription:     Initial Exercise Prescription - 06/21/16 1400      Date of Initial Exercise RX and Referring Provider   Date 06/21/16   Referring Provider End, Harrell Gave MD     Treadmill   MPH 2.6   Grade 0.5   Minutes 15   METs 3.17     REL-XR   Level 2   Speed 50   Minutes 15   METs 2     T5 Nustep   Level 3   SPM 80   Minutes 15   METs 2     Prescription Details   Frequency (times per week) 2   Duration Progress to 45 minutes of aerobic exercise without signs/symptoms of physical distress     Intensity   THRR 40-80% of Max Heartrate 94-135   Ratings of Perceived Exertion 11-13   Perceived Dyspnea 0-4     Progression   Progression Continue to progress workloads to maintain intensity without signs/symptoms of physical distress.     Resistance Training   Training Prescription Yes   Weight 3 lbs   Reps 10-15      Discharge Exercise Prescription (Final Exercise Prescription Changes):     Exercise Prescription Changes - 08/25/16 1500      Response to Exercise   Blood Pressure (Admit) 104/60   Blood Pressure (Exercise) 136/70   Blood Pressure (Exit) 134/70   Heart Rate (Admit) 73 bpm   Heart Rate (Exercise) 109 bpm   Heart Rate (Exit) 57 bpm   Rating of Perceived Exertion (Exercise) 13   Symptoms none   Duration Continue with 45 min of aerobic exercise without  signs/symptoms of physical distress.   Intensity THRR unchanged     Progression   Progression Continue to progress workloads to maintain intensity without signs/symptoms of physical distress.   Average METs 4.1     Resistance Training   Training Prescription Yes   Weight 6 lb   Reps 10-15     Interval Training   Interval Training No     Treadmill   MPH 2.8   Grade 2.5   Minutes 15   METs 4.11     REL-XR   Level 5   Minutes 15   METs 5.4     T5 Nustep   Level 4   Minutes 15   METs 2.7     Home Exercise Plan   Plans to continue exercise at Home (comment)  walking and biking   Frequency Add 2 additional days to program exercise sessions.   Initial Home Exercises Provided 07/01/16      Functional Capacity:     6 Minute Walk    Row Name 06/21/16 1443 08/31/16 0943 08/31/16 1012     6 Minute Walk   Phase Initial Discharge Discharge   Distance 1375 feet 1570 feet  -   Distance % Change  -  -  14 %  195 ft   Walk Time 6 minutes 6 minutes  -   # of Rest Breaks 0 0  -   MPH 2.6 2.97  -   METS 3.31 3.57  -   RPE 10 12  -   VO2 Peak 11.6 12.48  -   Symptoms No No  -   Resting HR 52 bpm 58 bpm  -   Resting BP 108/62  r arm 106/64 110/60  -   Max Ex. HR 101 bpm 97 bpm  -   Max Ex. BP 134/66 128/70  -   2 Minute Post BP 114/60  -  -      Quality of Life:     Quality of Life - 06/21/16 1343      Quality of Life Scores   Health/Function Pre 21.17 %   Socioeconomic Pre 24.21 %   Psych/Spiritual Pre 22.93 %   Family Pre 16.38 %   GLOBAL Pre 21.61 %      Personal Goals: Goals established at orientation with interventions provided to work toward goal.     Personal Goals and Risk Factors at Admission - 06/21/16 1340      Core Components/Risk Factors/Patient Goals on Admission    Weight Management Yes;Weight Loss   Intervention Weight Management: Develop a combined nutrition and exercise program designed to reach desired caloric intake, while  maintaining appropriate intake of nutrient and fiber, sodium and fats, and appropriate energy expenditure required for the weight goal.;Weight Management: Provide education and appropriate resources to help participant work on and attain dietary goals.   Admit Weight 205 lb 4.8 oz (93.1 kg)   Goal Weight: Short Term 200 lb (90.7 kg)   Goal Weight: Long Term 176 lb (79.8 kg)   Expected Outcomes Short Term: Continue to assess and modify interventions until short term weight is achieved;Long Term: Adherence to nutrition and physical activity/exercise program aimed toward attainment of established weight goal;Weight Loss: Understanding of general recommendations for a balanced deficit meal plan, which promotes 1-2 lb weight loss per week and includes a negative energy balance of [phone] kcal/d;Understanding recommendations for meals to include 15-35% energy as protein, 25-35% energy from fat, 35-60% energy from carbohydrates, less than 200mg  of dietary cholesterol, 20-35 gm of total fiber daily;Understanding of distribution of calorie intake throughout the day with the consumption of 4-5 meals/snacks   Tobacco Cessation --  quit 2009   Hypertension Yes   Intervention Monitor prescription use compliance.;Provide education on lifestyle modifcations including regular physical activity/exercise, weight management, moderate sodium restriction and increased consumption of fresh fruit, vegetables, and low fat dairy, alcohol moderation, and smoking cessation.   Expected Outcomes Short Term: Continued assessment and intervention until BP is < 140/58mm HG in hypertensive participants. < 130/35mm HG in hypertensive participants with diabetes, heart failure or chronic kidney disease.;Long Term: Maintenance of blood pressure at goal levels.   Lipids Yes   Intervention Provide education and support for participant on nutrition & aerobic/resistive exercise along with prescribed medications to achieve LDL 70mg , HDL >40mg .    Expected Outcomes Short Term: Participant states understanding of desired cholesterol values and is compliant with medications prescribed. Participant is following exercise prescription and nutrition guidelines.;Long Term: Cholesterol controlled with medications as prescribed, with individualized exercise RX and with personalized nutrition plan. Value goals: LDL < 70mg , HDL > 40 mg.   Stress Yes  Mom lives next door. Stressful when he goes to visit her. Had his heart attack at  her house.   Intervention Offer individual and/or small group education and counseling on adjustment to heart disease, stress management and health-related lifestyle change. Teach and support self-help strategies.;Refer participants experiencing significant psychosocial distress to appropriate mental health specialists for further evaluation and treatment. When possible, include family members and significant others in education/counseling sessions.   Expected Outcomes Short Term: Participant demonstrates changes in health-related behavior, relaxation and other stress management skills, ability to obtain effective social support, and compliance with psychotropic medications if prescribed.;Long Term: Emotional wellbeing is indicated by absence of clinically significant psychosocial distress or social isolation.       Personal Goals Discharge:     Goals and Risk Factor Review - 08/31/16 1016      Core Components/Risk Factors/Patient Goals Review   Review Weight is stable.    Expected Outcomes Heart healthy lifestyle      Nutrition & Weight - Outcomes:     Pre Biometrics - 06/21/16 1446      Pre Biometrics   Height 5\' 10"  (1.778 m)   Weight 205 lb 4.8 oz (93.1 kg)   Waist Circumference 41 inches   Hip Circumference 42 inches   Waist to Hip Ratio 0.98 %   BMI (Calculated) 29.5   Single Leg Stand 15.39 seconds         Post Biometrics - 08/31/16 0942       Post  Biometrics   Height 5\' 10"  (1.778 m)   Weight  205 lb 8 oz (93.2 kg)   Waist Circumference 40 inches   Hip Circumference 42 inches   Waist to Hip Ratio 0.95 %   BMI (Calculated) 29.5   Single Leg Stand 30 seconds      Nutrition:     Nutrition Therapy & Goals - 08/05/16 0854      Nutrition Therapy   RD appointment defered --      Nutrition Discharge:     Nutrition Assessments - 06/21/16 1310      MEDFICTS Scores   Pre Score 40      Education Questionnaire Score:     Knowledge Questionnaire Score - 06/21/16 1347      Knowledge Questionnaire Score   Pre Score 21/28      Goals reviewed with patient; copy given to patient.

## 2016-09-09 NOTE — Progress Notes (Signed)
Cardiac Individual Treatment Plan  Patient Details  Name: Edward [name] MRN: [id number] Date of Birth: 1950/10/30 Referring Provider:     Cardiac Rehab from 06/21/2016 in [facility] Cardiac and Pulmonary Rehab  Referring Provider  End, Harrell Gave MD      Initial Encounter Date:    Cardiac Rehab from 06/21/2016 in [facility] Cardiac and Pulmonary Rehab  Date  06/21/16  Referring Provider  End, Harrell Gave MD      Visit Diagnosis: ST elevation myocardial infarction (STEMI), unspecified artery [facility])  Status post coronary artery stent placement  Patient's Home Medications on Admission:  Current Outpatient Prescriptions:  .  aspirin EC 81 MG tablet, Take 1 tablet (81 mg total) by mouth daily., Disp: 90 tablet, Rfl: 3 .  atorvastatin (LIPITOR) 80 MG tablet, Take 1 tablet (80 mg total) by mouth daily., Disp: 90 tablet, Rfl: 3 .  carvedilol (COREG) 3.125 MG tablet, Take 1 tablet (3.125 mg total) by mouth 2 (two) times daily., Disp: 180 tablet, Rfl: 3 .  clopidogrel (PLAVIX) 75 MG tablet, Take 1 tablet (75 mg total) by mouth daily., Disp: 90 tablet, Rfl: 3 .  famotidine (PEPCID) 20 MG tablet, TAKE 1 TABLET (20 MG TOTAL) BY MOUTH 2 (TWO) TIMES DAILY., Disp: 30 tablet, Rfl: 3 .  losartan (COZAAR) 25 MG tablet, Take 1 tablet (25 mg total) by mouth daily., Disp: 180 tablet, Rfl: 3 .  nitroGLYCERIN (NITROSTAT) 0.4 MG SL tablet, Place 1 tablet (0.4 mg total) under the tongue every 5 (five) minutes as needed for chest pain. Maximum of 3 doses., Disp: 45 tablet, Rfl: 2 .  omega-3 acid ethyl esters (LOVAZA) 1 g capsule, Take by mouth 2 (two) times daily., Disp: , Rfl:  .  spironolactone (ALDACTONE) 25 MG tablet, Take 0.5 tablets (12.5 mg total) by mouth daily., Disp: 45 tablet, Rfl: 3  Past Medical History: Past Medical History:  Diagnosis Date  . Anxiety   . Apical mural thrombus   . BPH (benign prostatic hyperplasia)   . CHF (congestive heart failure) ([place])   . Coronary artery disease   . Hyperlipidemia    . Hypertension     Tobacco Use: History  Smoking Status  . Former Smoker  . Start date: 07/03/2007  Smokeless Tobacco  . Never Used    Comment: QUIT IN 2005    Labs: Recent Review Flowsheet Data    Labs for ITP Cardiac and Pulmonary Rehab Latest Ref Rng & Units 12/16/2015 03/09/2016 03/09/2016 05/26/2016 08/27/2016   Cholestrol 100 - 199 mg/dL 179 184 187 104 -   LDLCALC 0 - 99 mg/dL 120(H) 114(H) 124(H) 50 -   HDL >39 mg/dL 41 48 47 38(L) -   Trlycerides 0 - 149 mg/dL 88 111 79 81 -   Hemoglobin A1c 4.8 - 5.6 % - - 5.5 - 5.5       Exercise Target Goals:    Exercise Program Goal: Individual exercise prescription set with THRR, safety & activity barriers. Participant demonstrates ability to understand and report RPE using BORG scale, to self-measure pulse accurately, and to acknowledge the importance of the exercise prescription.  Exercise Prescription Goal: Starting with aerobic activity 30 plus minutes a day, 3 days per week for initial exercise prescription. Provide home exercise prescription and guidelines that participant acknowledges understanding prior to discharge.  Activity Barriers & Risk Stratification:     Activity Barriers & Cardiac Risk Stratification - 06/21/16 1303      Activity Barriers & Cardiac Risk Stratification   Activity  Barriers Deconditioning;Muscular Weakness   Cardiac Risk Stratification High      6 Minute Walk:     6 Minute Walk    Row Name 06/21/16 1443 08/31/16 0943 08/31/16 1012     6 Minute Walk   Phase Initial Discharge Discharge   Distance 1375 feet 1570 feet  -   Distance % Change  -  - 14 %  195 ft   Walk Time 6 minutes 6 minutes  -   # of Rest Breaks 0 0  -   MPH 2.6 2.97  -   METS 3.31 3.57  -   RPE 10 12  -   VO2 Peak 11.6 12.48  -   Symptoms No No  -   Resting HR 52 bpm 58 bpm  -   Resting BP 108/62  r arm 106/64 110/60  -   Max Ex. HR 101 bpm 97 bpm  -   Max Ex. BP 134/66 128/70  -   2 Minute Post BP 114/60  -  -       Oxygen Initial Assessment:   Oxygen Re-Evaluation:   Oxygen Discharge (Final Oxygen Re-Evaluation):   Initial Exercise Prescription:     Initial Exercise Prescription - 06/21/16 1400      Date of Initial Exercise RX and Referring Provider   Date 06/21/16   Referring Provider End, Harrell Gave MD     Treadmill   MPH 2.6   Grade 0.5   Minutes 15   METs 3.17     REL-XR   Level 2   Speed 50   Minutes 15   METs 2     T5 Nustep   Level 3   SPM 80   Minutes 15   METs 2     Prescription Details   Frequency (times per week) 2   Duration Progress to 45 minutes of aerobic exercise without signs/symptoms of physical distress     Intensity   THRR 40-80% of Max Heartrate 94-135   Ratings of Perceived Exertion 11-13   Perceived Dyspnea 0-4     Progression   Progression Continue to progress workloads to maintain intensity without signs/symptoms of physical distress.     Resistance Training   Training Prescription Yes   Weight 3 lbs   Reps 10-15      Perform Capillary Blood Glucose checks as needed.  Exercise Prescription Changes:     Exercise Prescription Changes    Row Name 06/21/16 1300 07/01/16 0900 07/15/16 1100 07/27/16 1400 08/11/16 1500     Response to Exercise   Blood Pressure (Admit) 108/62 100/54 112/64 124/74 124/60   Blood Pressure (Exercise) 134/66 126/54 142/70 146/80 128/70   Blood Pressure (Exit) 114/60 112/72 108/70 130/70 120/60   Heart Rate (Admit) 52 bpm 62 bpm 58 bpm 72 bpm 72 bpm   Heart Rate (Exercise) 101 bpm 86 bpm 104 bpm 91 bpm 99 bpm   Heart Rate (Exit) 52 bpm 57 bpm 65 bpm 71 bpm 56 bpm   Oxygen Saturation (Exercise) 99 %  -  -  -  -   Rating of Perceived Exertion (Exercise) 10 13 13 13 13    Symptoms none none none none none   Comments walk test results  -  -  -  -   Duration  - Continue with 45 min of aerobic exercise without signs/symptoms of physical distress. Continue with 45 min of aerobic exercise without signs/symptoms  of physical distress. Continue with 45 min of  aerobic exercise without signs/symptoms of physical distress. Continue with 45 min of aerobic exercise without signs/symptoms of physical distress.   Intensity  - THRR unchanged THRR unchanged THRR unchanged THRR unchanged     Progression   Progression  - Continue to progress workloads to maintain intensity without signs/symptoms of physical distress. Continue to progress workloads to maintain intensity without signs/symptoms of physical distress. Continue to progress workloads to maintain intensity without signs/symptoms of physical distress. Continue to progress workloads to maintain intensity without signs/symptoms of physical distress.   Average METs  - 2.83 3.8 4.1 4.8     Resistance Training   Training Prescription  - Yes Yes Yes Yes   Weight  - 3 lbs 4 lbs 5 lbs 6 lb   Reps  - 10-15 10-15 10-15 10-15     Interval Training   Interval Training  - No No No No     Treadmill   MPH  - 2.6 2.6 2.6  -   Grade  - 2.5 2.5 2.5  -   Minutes  - 15 15 15   -   METs  - 3.78 3.78 3.78  -     REL-XR   Level  - 5 5 5 5    Minutes  - 15 15 15 15    METs  - 2.6 5.1 5.4 6.6     T5 Nustep   Level  - 3 3 3 4    Minutes  - 15 15 15 15    METs  - 2 2.4 3 3      Home Exercise Plan   Plans to continue exercise at  - Home (comment)  walking and biking Home (comment)  walking and biking Home (comment)  walking and biking  -   Frequency  - Add 2 additional days to program exercise sessions. Add 2 additional days to program exercise sessions. Add 2 additional days to program exercise sessions.  -   Initial Home Exercises Provided  - 07/01/16 07/01/16 07/01/16  -   [place] Name 08/25/16 1500             Response to Exercise   Blood Pressure (Admit) 104/60       Blood Pressure (Exercise) 136/70       Blood Pressure (Exit) 134/70       Heart Rate (Admit) 73 bpm       Heart Rate (Exercise) 109 bpm       Heart Rate (Exit) 57 bpm       Rating of Perceived  Exertion (Exercise) 13       Symptoms none       Duration Continue with 45 min of aerobic exercise without signs/symptoms of physical distress.       Intensity THRR unchanged         Progression   Progression Continue to progress workloads to maintain intensity without signs/symptoms of physical distress.       Average METs 4.1         Resistance Training   Training Prescription Yes       Weight 6 lb       Reps 10-15         Interval Training   Interval Training No         Treadmill   MPH 2.8       Grade 2.5       Minutes 15       METs 4.11         REL-XR  Level 5       Minutes 15       METs 5.4         T5 Nustep   Level 4       Minutes 15       METs 2.7         Home Exercise Plan   Plans to continue exercise at Home (comment)  walking and biking       Frequency Add 2 additional days to program exercise sessions.       Initial Home Exercises Provided 07/01/16          Exercise Comments:     Exercise Comments    Row Name 09/09/16 0830           Exercise Comments  Irving graduated today from cardiac rehab with 36/36 sessions completed.  Details of the patient's exercise prescription and what He needs to do in order to continue the prescription and progress were discussed with patient.  Patient was given a copy of prescription and goals.  Patient verbalized understanding.  Tandre plans to continue to exercise by walking at home.          Exercise Goals and Review:     Exercise Goals    Row Name 06/21/16 1446             Exercise Goals   Increase Physical Activity Yes       Intervention Provide advice, education, support and counseling about physical activity/exercise needs.;Develop an individualized exercise prescription for aerobic and resistive training based on initial evaluation findings, risk stratification, comorbidities and participant's personal goals.       Expected Outcomes Achievement of increased cardiorespiratory fitness and enhanced  flexibility, muscular endurance and strength shown through measurements of functional capacity and personal statement of participant.       Increase Strength and Stamina Yes       Intervention Provide advice, education, support and counseling about physical activity/exercise needs.;Develop an individualized exercise prescription for aerobic and resistive training based on initial evaluation findings, risk stratification, comorbidities and participant's personal goals.       Expected Outcomes Achievement of increased cardiorespiratory fitness and enhanced flexibility, muscular endurance and strength shown through measurements of functional capacity and personal statement of participant.          Exercise Goals Re-Evaluation :     Exercise Goals Re-Evaluation    Row Name 07/01/16 0913 07/01/16 1550 07/13/16 1212 07/27/16 1432 08/05/16 0851     Exercise Goal Re-Evaluation   Exercise Goals Review Increase Physical Activity;Increase Strenth and Stamina Increase Physical Activity;Increase Strenth and Stamina Increase Physical Activity;Increase Strenth and Stamina Increase Physical Activity;Increase Strenth and Stamina Increase Physical Activity;Increase Strenth and Stamina   Comments Reviewed home exercise with pt today.  Pt plans to walk at home for exercise.  Reviewed THR, pulse, RPE, sign and symptoms, and when to call 911 or MD.  Also discussed weather considerations and indoor options.  Pt voiced understanding. Tommy is off to a good start in rehab.  He has completed three full days of exercise!  He has already moved up to level 5 on the XR as he said it felt pretty easy.  We will continue to monitor his progression. Nestor has been doing well in rehab. He starting to feel better.  He is stronger and has more stamina now especially while he is working.   He is doing some walking at home, about 1min and has moved bike in  front of TV to start exercises.   Cyler continues to do well in rehab.  He is up 5.4  METs on the XR.  We will continue to monitor his progression. Jaimeson is feeling better overall.  He is stronger and has more stamina.  He has actually started to enjoy exercsing.  Yesterday he did 3 miles on his recumbent bike in 30 minutes.  He has it set up in the living room and can watch TV while he does it.  He will be out all next week on vacation and plans to go hiking while on vacation.    Expected Outcomes Short: Add in at least one day of exercise at home.  Long: Make exercise part of routine. Short: Increase workload on the treadmill and stepper.  Long: Make exercise part of routine. Short: Start to exercise more regularly at home. Long: Exercise as part of regular routine. Short: Move up again on workloads.  Long: Exercise more at home. Short: Keep up with exercise while out of town.  Long: Conitnue to exercise independently.   [place] Name 08/11/16 1521 08/11/16 1525 08/25/16 1526         Exercise Goal Re-Evaluation   Exercise Goals Review Increase Physical Activity;Increase Strenth and Stamina Increase Physical Activity;Increase Strenth and Stamina Increase Physical Activity;Increase Strenth and Stamina     Comments Laden is progressing well and has added resistance to the XR and gone up on strength training weight. Santhiago is progressing well with exercise. Drayk continues to do well in rehab.  He enjoys coming to class and can feel his strength increasing.  He is up to 2.8 mph on the treadmill.  We will continue to monitor his progression.     Expected Outcomes Short - Ladd will complete HeartTrack program.  Long - Gabriella will continue to exercise on his own. Short - Pt will complete HT program  Long - Pt will exercise on his own Short: Sylas will increase his workload on the NuStep.  Long: Continue to exercise independently        Discharge Exercise Prescription (Final Exercise Prescription Changes):     Exercise Prescription Changes - 08/25/16 1500      Response to Exercise   Blood Pressure  (Admit) 104/60   Blood Pressure (Exercise) 136/70   Blood Pressure (Exit) 134/70   Heart Rate (Admit) 73 bpm   Heart Rate (Exercise) 109 bpm   Heart Rate (Exit) 57 bpm   Rating of Perceived Exertion (Exercise) 13   Symptoms none   Duration Continue with 45 min of aerobic exercise without signs/symptoms of physical distress.   Intensity THRR unchanged     Progression   Progression Continue to progress workloads to maintain intensity without signs/symptoms of physical distress.   Average METs 4.1     Resistance Training   Training Prescription Yes   Weight 6 lb   Reps 10-15     Interval Training   Interval Training No     Treadmill   MPH 2.8   Grade 2.5   Minutes 15   METs 4.11     REL-XR   Level 5   Minutes 15   METs 5.4     T5 Nustep   Level 4   Minutes 15   METs 2.7     Home Exercise Plan   Plans to continue exercise at Home (comment)  walking and biking   Frequency Add 2 additional days to program exercise sessions.   Initial Home Exercises  Provided 07/01/16      Nutrition:  Target Goals: Understanding of nutrition guidelines, daily intake of sodium 1500mg , cholesterol 200mg , calories 30% from fat and 7% or less from saturated fats, daily to have 5 or more servings of fruits and vegetables.  Biometrics:     Pre Biometrics - 06/21/16 1446      Pre Biometrics   Height 5\' 10"  (1.778 m)   Weight 205 lb 4.8 oz (93.1 kg)   Waist Circumference 41 inches   Hip Circumference 42 inches   Waist to Hip Ratio 0.98 %   BMI (Calculated) 29.5   Single Leg Stand 15.39 seconds         Post Biometrics - 08/31/16 0942       Post  Biometrics   Height 5\' 10"  (1.778 m)   Weight 205 lb 8 oz (93.2 kg)   Waist Circumference 40 inches   Hip Circumference 42 inches   Waist to Hip Ratio 0.95 %   BMI (Calculated) 29.5   Single Leg Stand 30 seconds      Nutrition Therapy Plan and Nutrition Goals:     Nutrition Therapy & Goals - 08/05/16 0854      Nutrition  Therapy   RD appointment defered --      Nutrition Discharge: Rate Your Plate Scores:     Nutrition Assessments - 09/09/16 0828      MEDFICTS Scores   Pre Score 40   Post Score 12   Score Difference -28      Nutrition Goals Re-Evaluation:     Nutrition Goals Re-Evaluation    Row Name 07/13/16 1404 08/05/16 0854 08/31/16 1017         Goals   Current Weight 207 lb (93.9 kg) 200 lb (90.7 kg)  -     Nutrition Goal  - Use calorieking app to look up nutrition information especially for foods when dining "out".  Reading Labels, increase fruits and vegetables, Decrease salty foods Jaevin said he is trying to eat heart healthy foods like whole grain bread but now is using gas x since some of those healthy foods cause gas.      Comment Scheduled nutrition appointment for after class on 6/28 He is using the app Lose It to track exercise and nutrtion.  He is reading labels and increasing fruits and vegetables.  He said that he is eating smart and healthy.  His weight has platued around 200lbs.  He would like to break through the barrier.  -     Expected Outcome  - Short: Continue to use Lose It app.  Break 200 lbs barrier.  Long: Continue to work on heart healthy diet.  -       Personal Goal #4 Re-Evaluation   Personal Goal #4  -  - Has decreased salted peppers.        Nutrition Goals Discharge (Final Nutrition Goals Re-Evaluation):     Nutrition Goals Re-Evaluation - 08/31/16 1017      Goals   Nutrition Goal Andros said he is trying to eat heart healthy foods like whole grain bread but now is using gas x since some of those healthy foods cause gas.      Personal Goal #4 Re-Evaluation   Personal Goal #4 Has decreased salted peppers.      Psychosocial: Target Goals: Acknowledge presence or absence of significant depression and/or stress, maximize coping skills, provide positive support system. Participant is able to verbalize types and ability to use techniques  and skills needed for  reducing stress and depression.   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 Never knows what income will be every month.      Quality of Life Scores:      Quality of Life - 09/09/16 0828      Quality of Life Scores   Health/Function Pre 21.17 %   Health/Function Post 20.7 %   Health/Function % Change -2.22 %   Socioeconomic Pre 24.21 %   Socioeconomic Post 22.5 %   Socioeconomic % Change  -7.06 %   Psych/Spiritual Pre 22.93 %   Psych/Spiritual Post 22.71 %   Psych/Spiritual % Change -0.96 %   Family Pre 16.38 %   Family Post 22.5 %   Family % Change 37.36 %   GLOBAL Pre 21.61 %   GLOBAL Post 21.77 %   GLOBAL % Change 0.74 %      PHQ-9: Recent Review Flowsheet Data    Depression screen [facility] 2/9 09/09/2016 06/21/2016 12/16/2015   Decreased Interest 0 0 0   Down, Depressed, Hopeless 1 0 0   PHQ - 2 Score 1 0 0   Altered sleeping 0 1  -   Tired, decreased energy 1 1 -   Change in appetite 0 0 -   Feeling bad or failure about yourself  1 0 -   Trouble concentrating 0 0 -   Moving slowly or fidgety/restless 0 0 -   Suicidal thoughts 0 0 -   PHQ-9 Score 3 2 -     Interpretation of Total Score  Total Score Depression Severity:  1-4 = Minimal depression, 5-9 = Mild depression, 10-14 = Moderate depression, 15-19 = Moderately severe depression, 20-27 = Severe depression   Psychosocial Evaluation and Intervention:     Psychosocial Evaluation - 08/31/16 1008      Psychosocial Evaluation & Interventions   Expected Outcomes Less stressful now that he knows his chest problems was gas not chest pain and his stress test was ok.    Continue Psychosocial Services  Follow up required by staff      Psychosocial Re-Evaluation:     Psychosocial  Re-Evaluation    [place] Name 07/13/16 1405 08/05/16 0917           Psychosocial Re-Evaluation   Current issues with Current Stress Concerns;Current Anxiety/Panic Current Stress Concerns;Current Anxiety/Panic      Comments Eytan has been doing well in rehab.  He is starting to notice how helpful it has been for gaining strength and stamina.  He continues to have panic attacks about every other day, whenever he begins to feel indigestion or gas in his chest or stomach.  He has not mentioned this to a doctor yet but does have an appointment on Thursday and plans to ask about it.  He is trying to do better when going to visit his mother.  He is trying not to let it get to him as much and still blames the stress from her as to the cause of his heart attack.  Overall he is feeling better and staying postitve. He is sleeping good as well. Carvel continues to do well in rehab.  He is doing more at home.  He did not mention the  panic attacks getting any worse since he saw his doctor and they talked about it being his stomach versus his heart.  He has been avoiding going to visit his mother, but does need to go today  to help her with her finances.  He is afraid that she is going to cause his death since he has lost his father and grandfather which he blames his mom for.  He has been sleeping better overall.  He is trying to stay positive but his anxiety causes him to look up symptoms on the internet which he tries to take lightly but it usually just add to his anxiety even more.  He is also getting ready to take a week long vacation.  He is looking forward to it, but still cause stress.      Expected Outcomes Short: Talk to doctor about panic attacks.  Long: Continue to stay positive and cope with his mother. Short: Enjoy his vacation.  Long: Stay positive and cope with his mother.      Interventions Encouraged to attend Cardiac Rehabilitation for the exercise;Stress management education;Relaxation education Encouraged  to attend Cardiac Rehabilitation for the exercise;Stress management education;Relaxation education      Continue Psychosocial Services  Follow up required by staff  -         Psychosocial Discharge (Final Psychosocial Re-Evaluation):     Psychosocial Re-Evaluation - 08/05/16 0917      Psychosocial Re-Evaluation   Current issues with Current Stress Concerns;Current Anxiety/Panic   Comments Jonnatan continues to do well in rehab.  He is doing more at home.  He did not mention the panic attacks getting any worse since he saw his doctor and they talked about it being his stomach versus his heart.  He has been avoiding going to visit his mother, but does need to go today  to help her with her finances.  He is afraid that she is going to cause his death since he has lost his father and grandfather which he blames his mom for.  He has been sleeping better overall.  He is trying to stay positive but his anxiety causes him to look up symptoms on the internet which he tries to take lightly but it usually just add to his anxiety even more.  He is also getting ready to take a week long vacation.  He is looking forward to it, but still cause stress.   Expected Outcomes Short: Enjoy his vacation.  Long: Stay positive and cope with his mother.   Interventions Encouraged to attend Cardiac Rehabilitation for the exercise;Stress management education;Relaxation education      Vocational Rehabilitation: Provide vocational rehab assistance to qualifying candidates.   Vocational Rehab Evaluation & Intervention:     Vocational Rehab - 06/21/16 1347      Initial Vocational Rehab Evaluation & Intervention   Assessment shows need for Vocational Rehabilitation No      Education: Education Goals: Education classes will be provided on a weekly basis, covering required topics. Participant will state understanding/return demonstration of topics presented.  Learning Barriers/Preferences:     Learning  Barriers/Preferences - 06/21/16 1346      Learning Barriers/Preferences   Learning Barriers None   Learning Preferences None      Education Topics: General Nutrition Guidelines/Fats and Fiber: -Group instruction provided by verbal, written material, models and posters to present the general guidelines for heart healthy nutrition. Gives an explanation and review of dietary fats and fiber.   Cardiac Rehab from 09/09/2016  in [facility] Cardiac [facility] Rehab  Date  07/27/16  Educator  CR  Instruction Review Code  2- meets goals/outcomes      Controlling Sodium/Reading Food Labels: -Group verbal and written material supporting the discussion of sodium use in heart healthy nutrition. Review and explanation with models, verbal and written materials for utilization of the food label.   Cardiac Rehab from 09/09/2016 in [facility] Cardiac and Pulmonary Rehab  Date  08/03/16  Educator  PI  Instruction Review Code  2- meets goals/outcomes      Exercise Physiology & Risk Factors: - Group verbal and written instruction with models to review the exercise physiology of the cardiovascular system and associated critical values. Details cardiovascular disease risk factors and the goals associated with each risk factor.   Aerobic Exercise & Resistance Training: - Gives group verbal and written discussion on the health impact of inactivity. On the components of aerobic and resistive training programs and the benefits of this training and how to safely progress through these programs.   Flexibility, Balance, General Exercise Guidelines: - Provides group verbal and written instruction on the benefits of flexibility and balance training programs. Provides general exercise guidelines with specific guidelines to those with heart or lung disease. Demonstration and skill practice provided.   Cardiac Rehab from 09/09/2016 in [facility] Cardiac and Pulmonary Rehab  Date  08/17/16  Educator  AS  Instruction Review Code  2-  meets goals/outcomes      Stress Management: - Provides group verbal and written instruction about the health risks of elevated stress, cause of high stress, and healthy ways to reduce stress.   Cardiac Rehab from 09/09/2016 in [facility] Cardiac and Pulmonary Rehab  Date  08/24/16  Educator  [facility]  Instruction Review Code  2- meets goals/outcomes      Depression: - Provides group verbal and written instruction on the correlation between heart/lung disease and depressed mood, treatment options, and the stigmas associated with seeking treatment.   Cardiac Rehab from 09/09/2016 in [facility] Cardiac and Pulmonary Rehab  Date  08/05/16  Educator  [facility]  Instruction Review Code  2- meets goals/outcomes      Anatomy & Physiology of the Heart: - Group verbal and written instruction and models provide basic cardiac anatomy and physiology, with the coronary electrical and arterial systems. Review of: AMI, Angina, Valve disease, Heart Failure, Cardiac Arrhythmia, Pacemakers, and the ICD.   Cardiac Rehab from 09/09/2016 in [facility] Cardiac and Pulmonary Rehab  Date  08/26/16  Educator  [facility]  Instruction Review Code  2- meets goals/outcomes      Cardiac Procedures: - Group verbal and written instruction and models to describe the testing methods done to diagnose heart disease. Reviews the outcomes of the test results. Describes the treatment choices: Medical Management, Angioplasty, or Coronary Bypass Surgery.   Cardiac Rehab from 09/09/2016 in [facility] Cardiac and Pulmonary Rehab  Date  08/31/16  Educator  CE  Instruction Review Code  2- meets goals/outcomes      Cardiac Medications: - Group verbal and written instruction to review commonly prescribed medications for heart disease. Reviews the medication, class of the drug, and side effects. Includes the steps to properly store meds and maintain the prescription regimen.   Cardiac Rehab from 09/09/2016 in [facility] Cardiac and Pulmonary Rehab  Date  09/07/16 [6/12 Part One  6/14 Part Two]  Educator  SB [8/7 part one 8/9 part two]  Instruction Review Code  2- meets goals/outcomes      Go  Sex-Intimacy & Heart Disease, Get SMART - Goal Setting: - Group verbal and written instruction through game format to discuss heart disease and the return to sexual intimacy. Provides group verbal and written material to discuss and apply goal setting through the application of the S.M.A.R.T. Method.   Cardiac Rehab from 09/09/2016 in [facility] Cardiac and Pulmonary Rehab  Date  08/31/16  Educator  CE  Instruction Review Code  2- meets goals/outcomes      Other Matters of the Heart: - Provides group verbal, written materials and models to describe Heart Failure, Angina, Valve Disease, and Diabetes in the realm of heart disease. Includes description of the disease process and treatment options available to the cardiac patient.   Cardiac Rehab from 09/09/2016 in [facility] Cardiac and Pulmonary Rehab  Date  08/26/16  Educator  [facility]  Instruction Review Code  2- meets goals/outcomes      Exercise & Equipment Safety: - Individual verbal instruction and demonstration of equipment use and safety with use of the equipment.   Cardiac Rehab from 09/09/2016 in [facility] Cardiac and Pulmonary Rehab  Date  06/21/16  Educator  SB  Instruction Review Code  2- meets goals/outcomes      Infection Prevention: - Provides verbal and written material to individual with discussion of infection control including proper hand washing and proper equipment cleaning during exercise session.   Cardiac Rehab from 09/09/2016 in [facility] Cardiac and Pulmonary Rehab  Date  06/21/16  Educator  SB  Instruction Review Code  2- meets goals/outcomes      Falls Prevention: - Provides verbal and written material to individual with discussion of falls prevention and safety.   Cardiac Rehab from 09/09/2016 in [facility] Cardiac and Pulmonary Rehab  Date  06/21/16  Educator  SB  Instruction Review Code  2- meets goals/outcomes       Diabetes: - Individual verbal and written instruction to review signs/symptoms of diabetes, desired ranges of glucose level fasting, after meals and with exercise. Advice that pre and post exercise glucose checks will be done for 3 sessions at entry of program.    Knowledge Questionnaire Score:     Knowledge Questionnaire Score - 09/09/16 0828      Knowledge Questionnaire Score   Pre Score 21/28   Post Score 27/28      Core Components/Risk Factors/Patient Goals at Admission:     Personal Goals and Risk Factors at Admission - 06/21/16 1340      Core Components/Risk Factors/Patient Goals on Admission    Weight Management Yes;Weight Loss   Intervention Weight Management: Develop a combined nutrition and exercise program designed to reach desired caloric intake, while maintaining appropriate intake of nutrient and fiber, sodium and fats, and appropriate energy expenditure required for the weight goal.;Weight Management: Provide education and appropriate resources to help participant work on and attain dietary goals.   Admit Weight 205 lb 4.8 oz (93.1 kg)   Goal Weight: Short Term 200 lb (90.7 kg)   Goal Weight: Long Term 176 lb (79.8 kg)   Expected Outcomes Short Term: Continue to assess and modify interventions until short term weight is achieved;Long Term: Adherence to nutrition and physical activity/exercise program aimed toward attainment of established weight goal;Weight Loss: Understanding of general recommendations for a balanced deficit meal plan, which promotes 1-2 lb weight loss per week and includes a negative energy balance of [phone] kcal/d;Understanding recommendations for meals to include 15-35% energy as protein, 25-35% energy from fat, 35-60% energy from carbohydrates, less than 200mg   of dietary cholesterol, 20-35 gm of total fiber daily;Understanding of distribution of calorie intake throughout the day with the consumption of 4-5 meals/snacks   Tobacco Cessation --   quit 2009   Hypertension Yes   Intervention Monitor prescription use compliance.;Provide education on lifestyle modifcations including regular physical activity/exercise, weight management, moderate sodium restriction and increased consumption of fresh fruit, vegetables, and low fat dairy, alcohol moderation, and smoking cessation.   Expected Outcomes Short Term: Continued assessment and intervention until BP is < 140/65mm HG in hypertensive participants. < 130/71mm HG in hypertensive participants with diabetes, heart failure or chronic kidney disease.;Long Term: Maintenance of blood pressure at goal levels.   Lipids Yes   Intervention Provide education and support for participant on nutrition & aerobic/resistive exercise along with prescribed medications to achieve LDL 70mg , HDL >40mg .   Expected Outcomes Short Term: Participant states understanding of desired cholesterol values and is compliant with medications prescribed. Participant is following exercise prescription and nutrition guidelines.;Long Term: Cholesterol controlled with medications as prescribed, with individualized exercise RX and with personalized nutrition plan. Value goals: LDL < 70mg , HDL > 40 mg.   Stress Yes  Mom lives next door. Stressful when he goes to visit her. Had his heart attack at her house.   Intervention Offer individual and/or small group education and counseling on adjustment to heart disease, stress management and health-related lifestyle change. Teach and support self-help strategies.;Refer participants experiencing significant psychosocial distress to appropriate mental health specialists for further evaluation and treatment. When possible, include family members and significant others in education/counseling sessions.   Expected Outcomes Short Term: Participant demonstrates changes in health-related behavior, relaxation and other stress management skills, ability to obtain effective social support, and compliance  with psychotropic medications if prescribed.;Long Term: Emotional wellbeing is indicated by absence of clinically significant psychosocial distress or social isolation.      Core Components/Risk Factors/Patient Goals Review:      Goals and Risk Factor Review    Row Name 07/13/16 1312 08/05/16 0910 08/31/16 1006 08/31/16 1016       Core Components/Risk Factors/Patient Goals Review   Personal Goals Review Weight Management/Obesity;Hypertension;Diabetes;Lipids Weight Management/Obesity;Hypertension;Diabetes;Lipids  -  -    Review Elex has been doing well in rehab.  His weight has been holding steady around 205-207 lbs.  He wants to get more weight off and realizes that exercising more at home will help.  He has done well wiht his blood pressure and blood sugars.  He checks his sugars regularly, but only does his pressures while in class.  We discussed getting a cuff for home measurements.  He has a follow appointment later this week and is planning to ask about his lipids  then. Antwan's weight has been holding steady around 200lbs.  He would like to break through that barrier.  He continues to do well with is sugars and pressures.  He does not check his pressure at home as even just going to check at [facility] on the automatic cuff makes him nervous. He continues to do well with his medications as well.  eating 1800 calories/day. Having problems with gas and gas x is helping. [place] stress test was done on Saturday and to see Dr End now to see him next Tuesday or Thursday.  Weight is stable.     Expected Outcomes Short: Ask about a lipid recheck and start to exercise more at home.  Long:  Continue to work on weight loss.  Short: Break through 200 lbs barrier.  Long: Continue to work on risk factor modification  - Heart healthy lifestyle       Core Components/Risk Factors/Patient Goals at Discharge (Final Review):      Goals and Risk Factor Review - 08/31/16 1016      Core Components/Risk  Factors/Patient Goals Review   Review Weight is stable.    Expected Outcomes Heart healthy lifestyle      ITP Comments:     ITP Comments    Row Name 06/21/16 1349 06/23/16 0853 06/24/16 0905 07/01/16 0915 07/13/16 1158   ITP Comments Medical review completed. Initial ITP created. Documentaion of diagnosis can be found [facility] 03/29/2016  admission 30 day review. Continue with ITP unless directed changes per Medical Director review  New to program First full day of exercise!  Patient was oriented to gym and equipment including functions, settings, policies, and procedures.  Patient's individual exercise prescription and treatment plan were reviewed.  All starting workloads were established based on the results of the 6 minute walk test done at initial orientation visit.  The plan for exercise progression was also introduced and progression will be customized based on patient's performance and goals. Pt noted that he does not have nitroglycerin for a prescription.  Note sent to MD. After todays medication lecture, Josph Macho had questions about heart symptoms.  He has a problem with belching and it is frequent. He asked if this may be form his heart condition.  He has been advised to call and talk to his doctor to help him determine if his symtoms are GI versus Cardiac.    [place] Name 07/21/16 0545 07/27/16 1023 08/18/16 0649       ITP Comments 30 day review. Continue with ITP unless directed changes per Medical Director review Harinder discussed his "indigestion" with his doctor.  They encouraged him to try using Mylanta for relief.  He has not started it yet, but his symptoms have improved since last week.  RN encouraged him to discuss taking Mylanta with his pharmacist to make sure that there are not any drug interactions. 30 day review. Continue with ITP unless directed changes per Medical Director review           Comments: Discharge ITP

## 2016-09-09 NOTE — Progress Notes (Signed)
Daily Session Note  Patient Details  Name: Edward [name] MRN: [id number] Date of Birth: 09/10/1950 Referring Provider:     Cardiac Rehab from 06/21/2016 in [facility] Cardiac and Pulmonary Rehab  Referring Provider  End, Christopher MD      Encounter Date: 09/09/2016  Check In:     Session Check In - 09/09/16 0814      Check-In   Location [facility]-Cardiac & Pulmonary Rehab   Staff Present Jessica Hawkins, MA, ACSM RCEP, Exercise Physiologist;Krista Spencer, RN BSN;Meredith Craven, RN BSN   Supervising physician immediately available to respond to emergencies See telemetry face sheet for immediately available ER MD   Medication changes reported     No   Fall or balance concerns reported    No   Warm-up and Cool-down Performed on first and last piece of equipment   Resistance Training Performed Yes   VAD Patient? No     Pain Assessment   Currently in Pain? No/denies   Multiple Pain Sites No         History  Smoking Status  . Former Smoker  . Start date: 07/03/2007  Smokeless Tobacco  . Never Used    Comment: QUIT IN 2005    Goals Met:  Independence with exercise equipment Exercise tolerated well Personal goals reviewed No report of cardiac concerns or symptoms Strength training completed today  Goals Unmet:  Not Applicable  Comments:  Murry graduated today from cardiac rehab with 36/36 sessions completed.  Details of the patient's exercise prescription and what He needs to do in order to continue the prescription and progress were discussed with patient.  Patient was given a copy of prescription and goals.  Patient verbalized understanding.  Markanthony plans to continue to exercise by walking at home.    Dr. Mark Miller is Medical Director for [facility] and [facility]. 

## 2016-09-14 ENCOUNTER — Ambulatory Visit: Payer: Commercial Managed Care - PPO

## 2016-09-16 ENCOUNTER — Ambulatory Visit: Payer: Commercial Managed Care - PPO

## 2016-09-21 ENCOUNTER — Ambulatory Visit: Payer: Commercial Managed Care - PPO

## 2016-09-22 ENCOUNTER — Ambulatory Visit: Payer: Commercial Managed Care - PPO | Admitting: Internal Medicine

## 2016-09-22 NOTE — Progress Notes (Signed)
Daily Session Note  Patient Details  Name: Edward [name] MRN: [id number] Date of Birth: 1951/01/04 Referring Provider:     Cardiac Rehab from 06/21/2016 in [facility] Cardiac and Pulmonary Rehab  Referring Provider  End, Harrell Gave MD      Encounter Date: 08/26/2016  Check In:      History  Smoking Status  . Former Smoker  . Start date: 07/03/2007  Smokeless Tobacco  . Never Used    Comment: QUIT IN 2005    Goals Met:  Independence with exercise equipment Exercise tolerated well No report of cardiac concerns or symptoms Strength training completed today  Goals Unmet:  Not Applicable  Comments: Pt able to follow exercise prescription today without complaint.  Will continue to monitor for progression.    Dr. Emily Filbert is Medical Director for [facility] and [facility].

## 2016-09-23 ENCOUNTER — Ambulatory Visit: Payer: Commercial Managed Care - PPO

## 2016-09-28 ENCOUNTER — Ambulatory Visit: Payer: Commercial Managed Care - PPO

## 2016-09-30 ENCOUNTER — Ambulatory Visit: Payer: Commercial Managed Care - PPO

## 2016-10-05 ENCOUNTER — Ambulatory Visit: Payer: Commercial Managed Care - PPO

## 2016-10-07 ENCOUNTER — Ambulatory Visit: Payer: Commercial Managed Care - PPO

## 2016-10-12 ENCOUNTER — Ambulatory Visit: Payer: Commercial Managed Care - PPO

## 2016-10-14 ENCOUNTER — Ambulatory Visit: Payer: Commercial Managed Care - PPO

## 2016-10-19 ENCOUNTER — Ambulatory Visit: Payer: Commercial Managed Care - PPO

## 2016-10-21 ENCOUNTER — Ambulatory Visit: Payer: Commercial Managed Care - PPO

## 2016-11-17 ENCOUNTER — Ambulatory Visit (INDEPENDENT_AMBULATORY_CARE_PROVIDER_SITE_OTHER): Payer: Commercial Managed Care - PPO | Admitting: Internal Medicine

## 2016-11-17 ENCOUNTER — Encounter: Payer: Self-pay | Admitting: Internal Medicine

## 2016-11-17 VITALS — BP 104/62 | HR 63 | Ht 70.0 in | Wt 207.0 lb

## 2016-11-17 DIAGNOSIS — I2510 Atherosclerotic heart disease of native coronary artery without angina pectoris: Secondary | ICD-10-CM

## 2016-11-17 DIAGNOSIS — I255 Ischemic cardiomyopathy: Secondary | ICD-10-CM

## 2016-11-17 DIAGNOSIS — R0789 Other chest pain: Secondary | ICD-10-CM

## 2016-11-17 DIAGNOSIS — E785 Hyperlipidemia, unspecified: Secondary | ICD-10-CM | POA: Diagnosis not present

## 2016-11-17 NOTE — Patient Instructions (Signed)
Medication Instructions:  Your physician has recommended you make the following change in your medication:  STOP taking lovaza (fish oil)  Labwork: BMET  Testing/Procedures: None    Follow-Up: Your physician wants you to follow-up in: 4 months with Dr. Saunders Revel You will receive a reminder letter in the mail two months in advance. If you don't receive a letter, please call our office to schedule the follow-up appointment.   Any Other Special Instructions Will Be Listed Below (If Applicable).     If you need a refill on your cardiac medications before your next appointment, please call your pharmacy.

## 2016-11-17 NOTE — Progress Notes (Signed)
Follow-up Outpatient Visit Date: 11/17/2016  Primary Care Provider: Margo Common, [address] [place] [address]  Chief Complaint: Follow-up coronary artery disease  HPI:  Mr. Budai is a 66 y.o. year-old male with history of coronary artery disease status post anterior STEMI (late presentation) in 05/1658, chronic systolic heart failure secondary to ischemic cardiomyopathy complicated by apical thrombus (resolved following anticoagulation 3 months), hypertension, and hyperlipidemia, who presents for follow-up of coronary artery disease and atypical chest pain. I last saw Mr. Mortensen in early august, at which time he was doing well. He noted occasional "gas-like" pains under the left inferior costal margin that improved with simethicone. He has continued to have the pain several days a week but is not as concerned about them following his reassuring stress test this summer. The symptoms are sometimes positional and more often related to having just eaten. He continues to use simethicone with some relief. He has not had any exertional chest pain. He also denies shortness of breath, orthopnea, and edema. He notes occasional brief palpitation when bending over without any other accompanying symptoms. Mr. Bangs remains compliant with his medications, which he is tolerating well. He is trying to watch his salt intake and avoid fatty foods, though he had some dietary indiscretions [place]  --------------------------------------------------------------------------------------------------  Cardiovascular History & Procedures: Cardiovascular Problems:  CAD s/p anterior STEMI with PCI to proximal LAD (03/2016)  Ischemic cardiomyopathy with severe LV dysfunction  LV apical thrombus  Risk Factors:  Known CAD, HTN, hyperlipidemia, male gender, and age > 41  Cath/PCI:  LHC/PCI (03/09/16): Late presenting STEMI with thrombotic occlusion of the  proximal/mid LAD involving D1 and D2. Mild to moderate, non-obstructive CAD involving LCx and RCA. Successful IVUS-guided PCI to proximal and mid LAD using Integrity Resolute 3.0 x 38 mm DES (post-dilated up to 4.0 mm) with 0% residual stenosis and TIMI-3 flow. D2 was jailed by the LAD stent resulting in 70% ostial stenosis with TIMI-3 flow.  CV Surgery:  None  EP Procedures and Devices:  None  Non-Invasive Evaluation(s):  Pharmacologic MPI (08/28/16): Low to intermediate risk scan without any significant ischemia. There is a moderate in size, fixed mid and apical anteroseptal and apical defect. LVEF is 45% with apical anteroseptal and apical hypokinesis.  TTE (06/15/16): Normal LV size with mild LVH. LVEF 35-40% with mid and apical anterior/anteroseptal and apical akinesis. No evidence of LV thrombus. Grade 1 diastolic dysfunction. Mild biatrial enlargement. Normal RV size and function.  TTE (03/10/16): Moderate to severe LV dysfunction with LAD territory akinesis (LVEF 30-35%). Likely mural thrombus at LV apex. Limited evaluation of cardiac valve due to lack of parasternal windows; no significant valvular abnormality identified. Normal RV size and function.  Recent CV Pertinent Labs: Lab Results  Component Value Date   CHOL 104 05/26/2016   HDL 38 (L) 05/26/2016   LDLCALC 50 05/26/2016   TRIG 81 05/26/2016   CHOLHDL 4.0 03/09/2016   INR 4.1 06/16/2016   INR 2.31 03/19/2016   K 4.2 08/28/2016   MG 1.9 03/10/2016   BUN 15 08/28/2016   BUN 16 05/26/2016   CREATININE 0.81 08/28/2016    Past medical and surgical history were reviewed and updated in EPIC.  Current Meds  Medication Sig  . aspirin EC 81 MG tablet Take 1 tablet (81 mg total) by mouth daily.  Marland Kitchen atorvastatin (LIPITOR) 80 MG tablet Take 1 tablet (80 mg total) by mouth daily.  . carvedilol (  COREG) 3.125 MG tablet Take 1 tablet (3.125 mg total) by mouth 2 (two) times daily.  . clopidogrel (PLAVIX) 75 MG tablet Take 1  tablet (75 mg total) by mouth daily.  Marland Kitchen losartan (COZAAR) 25 MG tablet Take 1 tablet (25 mg total) by mouth daily.  . nitroGLYCERIN (NITROSTAT) 0.4 MG SL tablet Place 1 tablet (0.4 mg total) under the tongue every 5 (five) minutes as needed for chest pain. Maximum of 3 doses.  Marland Kitchen spironolactone (ALDACTONE) 25 MG tablet Take 0.5 tablets (12.5 mg total) by mouth daily.    Allergies: Patient has no known allergies.  Social History   Social History  . Marital status: Married    Spouse name: N/A  . Number of children: N/A  . Years of education: N/A   Occupational History  . Not on file.   Social History Main Topics  . Smoking status: Former Smoker    Start date: 07/03/2007  . Smokeless tobacco: Never Used     Comment: QUIT IN 2005  . Alcohol use 1.8 oz/week    3 Cans of beer per week  . Drug use: Yes    Types: Marijuana     Comment: smokes pot  . Sexual activity: Not on file   Other Topics Concern  . Not on file   Social History Narrative   Living with family, Independent at baseline    Family History  Problem Relation Age of Onset  . Emphysema Mother   . Macular degeneration Mother   . Heart attack Mother   . Heart attack Father     Review of Systems: A 12-system review of systems was performed and was negative except as noted in the HPI.  --------------------------------------------------------------------------------------------------  Physical Exam: BP 104/62 (BP Location: Left Arm, Patient Position: Sitting, Cuff Size: Normal)   Pulse 63   Ht 5\' 10"  (1.778 m)   Wt 207 lb (93.9 kg)   BMI 29.70 kg/m   General:  Overweight man, seated comfortably in the exam room. HEENT: No conjunctival pallor or scleral icterus. Moist mucous membranes.  OP clear. Neck: Supple without lymphadenopathy, thyromegaly, JVD, or HJR. Lungs: Normal work of breathing. Clear to auscultation bilaterally without wheezes or crackles. Heart: Regular rate and rhythm without murmurs, rubs, or  gallops. Non-displaced PMI. Abd: Bowel sounds present. Soft, NT/ND without hepatosplenomegaly Ext: No lower extremity edema. Radial, PT, and DP pulses are 2+ bilaterally. Skin: Warm and dry without rash.  EKG:  Normal sinus rhythm with old anteroseptal MI. Previously noted RBBB is no longer evident.  Lab Results  Component Value Date   WBC 5.7 08/28/2016   HGB 13.6 08/28/2016   HCT 39.6 (L) 08/28/2016   MCV 96.2 08/28/2016   PLT 175 08/28/2016    Lab Results  Component Value Date   NA 139 08/28/2016   K 4.2 08/28/2016   CL 106 08/28/2016   CO2 27 08/28/2016   BUN 15 08/28/2016   CREATININE 0.81 08/28/2016   GLUCOSE 96 08/28/2016   ALT 21 05/26/2016    Lab Results  Component Value Date   CHOL 104 05/26/2016   HDL 38 (L) 05/26/2016   LDLCALC 50 05/26/2016   TRIG 81 05/26/2016   CHOLHDL 4.0 03/09/2016    --------------------------------------------------------------------------------------------------  ASSESSMENT AND PLAN: Coronary artery disease with atypical chest pain Overall, Mr. Koral feels about the same as at our last visit. He continues to have gassy pain in the left chest and upper abdomen. Though he had similar discomfort around the  time of his STEMI in February, I am not convinced that his pain is anginal in nature. He had residual diagonal disease, as well as mild to moderate stenoses involving the LCx and RCA. However, MPI this summer showed only apical anterior and apical scar without any evidence of ischemia. We will continue our current medical therapy. I encouraged Mr. Rout to stop taking fish oil, given lack of proven benefits.  Ischemic cardiomyopathy Mr. Clemon appears euvolemic and well-compensated with NYHA class II symptoms. We will continue his current regimen of carvedilol, losartan, and spironolactone. His borderline low blood pressure preclude uptitration or  Switching to Entresto. I will check a BMP today to ensure stable renal function and  potassium.  Hyperlipidemia Mr. Robin is tolerating high-intensity statin therapy well. LDL is at goal. No medication changes today.  Follow-up: Return to clinic in 4 months.  Nelva Bush, MD 11/17/2016 9:56 AM

## 2016-11-18 LAB — BASIC METABOLIC PANEL
BUN/Creatinine Ratio: 21 (ref 10–24)
BUN: 19 mg/dL (ref 8–27)
CALCIUM: 9.2 mg/dL (ref 8.6–10.2)
CHLORIDE: 103 mmol/L (ref 96–106)
CO2: 22 mmol/L (ref 20–29)
Creatinine, Ser: 0.9 mg/dL (ref 0.76–1.27)
GFR calc Af Amer: 104 mL/min/{1.73_m2} (ref >59)
GFR, EST NON AFRICAN AMERICAN: 90 mL/min/{1.73_m2} (ref >59)
Glucose: 156 mg/dL — ABNORMAL HIGH (ref 65–99)
POTASSIUM: 4.2 mmol/L (ref 3.5–5.2)
Sodium: 141 mmol/L (ref 134–144)

## 2016-11-19 NOTE — Addendum Note (Signed)
Addended by: Britt Bottom on: 11/19/2016 08:24 AM   Modules accepted: Orders

## 2016-12-20 NOTE — Progress Notes (Signed)
This encounter was created in error - please disregard.

## 2017-01-04 ENCOUNTER — Other Ambulatory Visit: Payer: Self-pay

## 2017-01-04 MED ORDER — CARVEDILOL 3.125 MG PO TABS: 3.1250 mg | ORAL_TABLET | Freq: Two times a day (BID) | ORAL | 2 refills | Status: DC

## 2017-01-04 MED ORDER — LOSARTAN POTASSIUM 25 MG PO TABS: 25.0000 mg | ORAL_TABLET | Freq: Every day | ORAL | 2 refills | Status: DC

## 2017-01-04 MED ORDER — SPIRONOLACTONE 25 MG PO TABS: 12.5000 mg | ORAL_TABLET | Freq: Every day | ORAL | 2 refills | Status: DC

## 2017-01-04 MED ORDER — CLOPIDOGREL BISULFATE 75 MG PO TABS: 75.0000 mg | ORAL_TABLET | Freq: Every day | ORAL | 2 refills | Status: DC

## 2017-01-04 MED ORDER — ATORVASTATIN CALCIUM 80 MG PO TABS: 80.0000 mg | ORAL_TABLET | Freq: Every day | ORAL | 2 refills | Status: DC

## 2017-01-04 NOTE — Telephone Encounter (Signed)
Please advise if ok to refill. 

## 2017-01-12 ENCOUNTER — Telehealth: Payer: Self-pay | Admitting: Internal Medicine

## 2017-01-12 NOTE — Telephone Encounter (Signed)
Pharmacy calling to clarify dosage for Losartan 25 mg po once daily quantity 180   Please call to verify dose and quantity

## 2017-01-12 NOTE — Telephone Encounter (Signed)
LMOM to call back regarding Kadyn's medication clarification for Losartan.

## 2017-01-13 ENCOUNTER — Encounter: Payer: Self-pay | Admitting: Family Medicine

## 2017-01-13 NOTE — Telephone Encounter (Signed)
Tammy with [facility] order needed to verify the dosage of Losartan due to the quantity of pills that was sent in of 180 tablets did not match the actual Rx.  Told Tammy the patient is taking Losartan 25 mg take one tablet daily and should be # 90 tablets for a 3 month supply and not 180 tablets.

## 2017-01-14 ENCOUNTER — Telehealth: Payer: Self-pay | Admitting: Internal Medicine

## 2017-01-14 MED ORDER — CLOPIDOGREL BISULFATE 75 MG PO TABS: 75.0000 mg | ORAL_TABLET | Freq: Every day | ORAL | 0 refills | Status: DC

## 2017-01-14 MED ORDER — CARVEDILOL 3.125 MG PO TABS: 3.1250 mg | ORAL_TABLET | Freq: Two times a day (BID) | ORAL | 0 refills | Status: DC

## 2017-01-14 MED ORDER — ATORVASTATIN CALCIUM 80 MG PO TABS: 80.0000 mg | ORAL_TABLET | Freq: Every day | ORAL | 0 refills | Status: DC

## 2017-01-14 NOTE — Telephone Encounter (Signed)
S/w patient. He is in the process of receiving his medications now from [facility]. He thought they had already been shipped but they did not ship until today and he is out of atorvastatin, carvedilol and plavix starting tomorrow. Advised I will send in a 1 week supply to his local pharmacy in hopes to get him through until his mail order arrives. He was very Patent attorney.

## 2017-01-14 NOTE — Telephone Encounter (Signed)
Patient states he will be out of his lipitor, carvedilol, and plavix starting tomorrow  Edward [name] has stated that medication is being shipped Patient would like to know if he can skip a day or two of medication Please call to discuss

## 2017-03-13 ENCOUNTER — Emergency Department
Admission: EM | Admit: 2017-03-13 | Discharge: 2017-03-14 | Disposition: A | Discharge status: Home / Self Care | Payer: Medicare Other | Attending: Emergency Medicine | Admitting: Emergency Medicine

## 2017-03-13 ENCOUNTER — Emergency Department: Payer: Medicare Other

## 2017-03-13 ENCOUNTER — Other Ambulatory Visit: Payer: Self-pay

## 2017-03-13 ENCOUNTER — Encounter: Payer: Self-pay | Admitting: *Deleted

## 2017-03-13 DIAGNOSIS — F419 Anxiety disorder, unspecified: Secondary | ICD-10-CM | POA: Diagnosis not present

## 2017-03-13 DIAGNOSIS — R079 Chest pain, unspecified: Secondary | ICD-10-CM | POA: Diagnosis present

## 2017-03-13 DIAGNOSIS — Z7982 Long term (current) use of aspirin: Secondary | ICD-10-CM | POA: Diagnosis not present

## 2017-03-13 DIAGNOSIS — I5022 Chronic systolic (congestive) heart failure: Secondary | ICD-10-CM | POA: Insufficient documentation

## 2017-03-13 DIAGNOSIS — Z79899 Other long term (current) drug therapy: Secondary | ICD-10-CM | POA: Diagnosis not present

## 2017-03-13 DIAGNOSIS — Z7902 Long term (current) use of antithrombotics/antiplatelets: Secondary | ICD-10-CM | POA: Diagnosis not present

## 2017-03-13 DIAGNOSIS — R05 Cough: Secondary | ICD-10-CM | POA: Diagnosis not present

## 2017-03-13 DIAGNOSIS — I110 Hypertensive heart disease with heart failure: Secondary | ICD-10-CM | POA: Diagnosis not present

## 2017-03-13 DIAGNOSIS — R0789 Other chest pain: Secondary | ICD-10-CM | POA: Diagnosis not present

## 2017-03-13 DIAGNOSIS — I2510 Atherosclerotic heart disease of native coronary artery without angina pectoris: Secondary | ICD-10-CM | POA: Diagnosis not present

## 2017-03-13 DIAGNOSIS — Z87891 Personal history of nicotine dependence: Secondary | ICD-10-CM | POA: Diagnosis not present

## 2017-03-13 DIAGNOSIS — R072 Precordial pain: Secondary | ICD-10-CM | POA: Diagnosis not present

## 2017-03-13 LAB — BASIC METABOLIC PANEL
ANION GAP: 13 (ref 5–15)
BUN: 12 mg/dL (ref 6–20)
CALCIUM: 9.5 mg/dL (ref 8.9–10.3)
CO2: 23 mmol/L (ref 22–32)
CREATININE: 0.72 mg/dL (ref 0.61–1.24)
Chloride: 106 mmol/L (ref 101–111)
GFR calc non Af Amer: >60 mL/min (ref >60)
Glucose, Bld: 100 mg/dL — ABNORMAL HIGH (ref 65–99)
Potassium: 3.8 mmol/L (ref 3.5–5.1)
SODIUM: 142 mmol/L (ref 135–145)

## 2017-03-13 LAB — CBC
HCT: 45.1 % (ref 40.0–52.0)
HEMOGLOBIN: 15.5 g/dL (ref 13.0–18.0)
MCH: 33.3 pg (ref 26.0–34.0)
MCHC: 34.4 g/dL (ref 32.0–36.0)
MCV: 96.8 fL (ref 80.0–100.0)
PLATELETS: 196 10*3/uL (ref 150–440)
RBC: 4.66 MIL/uL (ref 4.40–5.90)
RDW: 12.9 % (ref 11.5–14.5)
WBC: 6.5 10*3/uL (ref 3.8–10.6)

## 2017-03-13 LAB — TROPONIN I: Troponin I: <0.03 ng/mL (ref <0.03)

## 2017-03-13 MED ORDER — SODIUM CHLORIDE 0.9 % IV BOLUS (SEPSIS)
500.0000 mL | Freq: Once | INTRAVENOUS | Status: AC
  Administered 2017-03-13: 500 mL via INTRAVENOUS

## 2017-03-13 MED ORDER — ASPIRIN 81 MG PO CHEW
162.0000 mg | CHEWABLE_TABLET | Freq: Once | ORAL | Status: AC
  Administered 2017-03-13: 162 mg via ORAL
  Filled 2017-03-13: qty 2

## 2017-03-13 NOTE — ED Triage Notes (Signed)
Pt to ED reporting new onset of left sided chest pain that radiates to left shoulder and around left side. No fevers and no cough reported. Pt states he has had increased "gas" and belching and reports relief while belching but verbalized it does not help the pain for the long term. PT has hx of MI and reports the pain was similar to today. No SOB but dizziness this morning was reported and "just not feeling right"

## 2017-03-13 NOTE — ED Provider Notes (Signed)
[facility] Emergency Department Provider Note ____________________________________________   First MD Initiated Contact with Patient 03/13/17 2112     (approximate)  I have reviewed the triage vital signs and the nursing notes.   HISTORY  Chief Complaint Chest Pain    HPI Edward [name] is a 67 y.o. male with PMH as noted below including CHF and CAD who presents with chest pain, substernal but also radiating to the left shoulder and the epigastric area, relieved with belching, but then returning.  Patient states his been present for most of the day today.  He also noted that when he got up today he felt slightly lightheaded and this is persisted throughout the day.  Patient denies associated nausea or vomiting, fever chills, coughing, leg pain or swelling, or diarrhea.  He states that he had somewhat similar but more severe discomfort last year when he was diagnosed with an MI.  Past Medical History:  Diagnosis Date  . Anxiety   . Apical mural thrombus   . BPH (benign prostatic hyperplasia)   . CHF (congestive heart failure) ([place])   . Coronary artery disease   . Hyperlipidemia   . Hypertension     Patient Active Problem List   Diagnosis Date Noted  . Atypical chest pain 08/27/2016  . Chronic systolic heart failure ([place]) 04/07/2016  . CAD in native artery 03/25/2016  . Apical mural thrombus following MI ([place]) 03/22/2016  . Encounter for therapeutic drug monitoring 03/22/2016  . Clostridium difficile diarrhea   . Ischemic cardiomyopathy   . Smoker   . Hyperlipidemia LDL goal <70   . Encounter for anticoagulation discussion and counseling   . Anxiety 07/30/2014  . Arthritis of neck 07/30/2014  . ED (erectile dysfunction) of organic origin 07/30/2014  . Personal history of tobacco use, presenting hazards to health 07/30/2014  . Adiposity 07/30/2014  . Delayed onset of urination 07/30/2014  . Brachial neuritis 08/26/2009  . Actinic keratoses  07/30/2008  . Essential (primary) hypertension 10/29/2005    Past Surgical History:  Procedure Laterality Date  . CORONARY ANGIOGRAPHY N/A 03/09/2016   Procedure: Coronary Angiography;  Surgeon: Nelva Bush, MD;  Location: [facility];  Service: Cardiovascular;  Laterality: N/A;  . CORONARY STENT INTERVENTION N/A 03/09/2016   Procedure: Coronary Stent Intervention;  Surgeon: Nelva Bush, MD;  Location: [facility];  Service: Cardiovascular;  Laterality: N/A;  . HERNIA REPAIR  2005  . HYDROCELE EXCISION      Prior to Admission medications   Medication Sig Start Date End Date Taking? Authorizing Provider  aspirin EC 81 MG tablet Take 1 tablet (81 mg total) by mouth daily. 06/16/16   End, [place] Gave, MD  atorvastatin (LIPITOR) 80 MG tablet Take 1 tablet (80 mg total) by mouth daily. 01/14/17 04/14/17  End, [place] Gave, MD  carvedilol (COREG) 3.125 MG tablet Take 1 tablet (3.125 mg total) by mouth 2 (two) times daily. 01/14/17 04/14/17  End, [place]rell Gave, MD  clopidogrel (PLAVIX) 75 MG tablet Take 1 tablet (75 mg total) by mouth daily. 01/14/17   End, [place] Gave, MD  losartan (COZAAR) 25 MG tablet Take 1 tablet (25 mg total) by mouth daily. 01/04/17 04/04/17  End, [place] Gave, MD  nitroGLYCERIN (NITROSTAT) 0.4 MG SL tablet Place 1 tablet (0.4 mg total) under the tongue every 5 (five) minutes as needed for chest pain. Maximum of 3 doses. 07/01/16 11/17/16  End, [place]rell Gave, MD  spironolactone (ALDACTONE) 25 MG tablet Take 0.5 tablets (12.5 mg total) by  mouth daily. 01/04/17 04/04/17  End, [place] Gave, MD    Allergies Patient has no known allergies.  Family History  Problem Relation Age of Onset  . Emphysema Mother   . Macular degeneration Mother   . Heart attack Mother   . Heart attack Father     Social History Social History   Tobacco Use  . Smoking status: Former Smoker    Start date: 07/03/2007  . Smokeless tobacco: Never Used  . Tobacco comment: QUIT IN 2005    Substance Use Topics  . Alcohol use: Yes    Alcohol/week: 1.8 oz    Types: 3 Cans of beer per week  . Drug use: Yes    Types: Marijuana    Comment: smokes pot    Review of Systems  Constitutional: No fever. Eyes: No redness. ENT: No sore throat. Cardiovascular: Positive for chest pain. Respiratory: Denies shortness of breath. Gastrointestinal: No nausea, no vomiting.  Genitourinary: Negative for dysuria.  Musculoskeletal: Negative for back pain. Skin: Negative for rash. Neurological: Negative for headache.   ____________________________________________   PHYSICAL EXAM:  VITAL SIGNS: ED Triage Vitals [03/13/17 2024]  Enc Vitals Group     BP 139/87     Pulse Rate 79     Resp 16     Temp 98.2 F (36.8 C)     Temp Source Oral     SpO2 96 %     Weight 207 lb (93.9 kg)     Height      Head Circumference      Peak Flow      Pain Score 5     Pain Loc      Pain Edu?      Excl. in [place]?     Constitutional: Alert and oriented. Well appearing and in no acute distress. Eyes: Conjunctivae are normal.  Head: Atraumatic. Nose: No congestion/rhinnorhea. Mouth/Throat: Mucous membranes are moist.   Neck: Normal range of motion.  Cardiovascular: Normal rate, regular rhythm. Grossly normal heart sounds.  Good peripheral circulation.  Chest wall nontender. Respiratory: Normal respiratory effort.  No retractions. Lungs CTAB. Gastrointestinal: Soft and nontender. No distention.  Genitourinary: No flank tenderness. Musculoskeletal: No lower extremity edema.  Extremities warm and well perfused.  No calf or popliteal swelling or tenderness Neurologic:  Normal speech and language. No gross focal neurologic deficits are appreciated.  Skin:  Skin is warm and dry. No rash noted. Psychiatric: Mood and affect are normal. Speech and behavior are normal.  ____________________________________________   LABS (all labs ordered are listed, but only abnormal results are displayed)  Labs  Reviewed  BASIC METABOLIC PANEL - Abnormal; Notable for the following components:      Result Value   Glucose, Bld 100 (*)    All other components within normal limits  CBC  TROPONIN I  TROPONIN I   ____________________________________________  EKG  ED ECG REPORT I, Arta Silence, the attending physician, personally viewed and interpreted this ECG.  Date: 03/13/2017 EKG Time: 2019 Rate: 76 Rhythm: normal sinus rhythm QRS Axis: Left axis deviation Intervals: Right bundle branch block ST/T Wave abnormalities: Nonspecific abnormalities, anteroseptal Narrative Interpretation: no evidence of acute ischemia no significant change when compared to EKG of 11/17/2016  ____________________________________________  RADIOLOGY  CXR: No focal infiltrate or other acute findings  ____________________________________________   PROCEDURES  Procedure(s) performed: No  Procedures  Critical Care performed: No ____________________________________________   INITIAL IMPRESSION / ASSESSMENT AND PLAN / ED COURSE  Pertinent labs & imaging results that  were available during my care of the patient were reviewed by me and considered in my medical decision making (see chart for details).  67 year old male with past medical history as noted above including prior history of CAD presents with atypical and somewhat GERD-like chest pain with belching over the last day as well as mild lightheadedness since he awoke this morning.  Patient states that since he has arrived in the ED the pain is almost completely resolved.  I reviewed the past medical records in Epic; the patient was most recently admitted in July of last year for chest pain with negative troponins and stress test.  He was diagnosed with STEMI in February 2018, and had PCI with stents at that time.  On exam here, patient is well-appearing, the vital signs are normal, and the remainder the exam is unremarkable.  EKG is unchanged from  patient's baseline, first troponin is negative, and the chest x-ray is unremarkable.  Patient reports significant improvement in his symptoms.  Although he is relatively high risk for ACS, given the mostly resolved symptoms and the negative initial workup, this presentation appears to be less consistent with ACS.  Suspect most likely GERD/gastritis versus musculoskeletal pain.  Given patient's increased risk, we will observe the patient and obtain a 3-hour repeat troponin.  If this is negative and patient continues to have improving or resolved symptoms, anticipate discharge home.    ----------------------------------------- 11:30 PM on 03/13/2017 -----------------------------------------  Patient is pending repeat troponin.  I signed the patient out to the oncoming physician Dr. Owens Shark.  Anticipate discharge home if patient's symptoms continue to improve and his repeat troponin is negative.  ____________________________________________   FINAL CLINICAL IMPRESSION(S) / ED DIAGNOSES  Final diagnoses:  Atypical chest pain      NEW MEDICATIONS STARTED DURING THIS VISIT:  New Prescriptions   No medications on file     Note:  This document was prepared using Dragon voice recognition software and may include unintentional dictation errors.    Arta Silence, MD 03/13/17 2330

## 2017-03-13 NOTE — Discharge Instructions (Signed)
Return to the ER for new, worsening, recurrent chest pain, difficulty breathing, weakness or lightheadedness, or any other new or worsening symptoms that concern you.  You should call Dr. Saunders Revel to arrange for follow-up within the next week.

## 2017-03-23 ENCOUNTER — Ambulatory Visit: Payer: Commercial Managed Care - PPO | Admitting: Internal Medicine

## 2017-03-23 ENCOUNTER — Ambulatory Visit (INDEPENDENT_AMBULATORY_CARE_PROVIDER_SITE_OTHER): Payer: Medicare Other | Admitting: Internal Medicine

## 2017-03-23 ENCOUNTER — Encounter: Payer: Self-pay | Admitting: Internal Medicine

## 2017-03-23 VITALS — BP 110/68 | HR 54 | Ht 70.0 in | Wt 208.2 lb

## 2017-03-23 DIAGNOSIS — I25119 Atherosclerotic heart disease of native coronary artery with unspecified angina pectoris: Secondary | ICD-10-CM | POA: Diagnosis not present

## 2017-03-23 DIAGNOSIS — E785 Hyperlipidemia, unspecified: Secondary | ICD-10-CM | POA: Diagnosis not present

## 2017-03-23 DIAGNOSIS — I5022 Chronic systolic (congestive) heart failure: Secondary | ICD-10-CM

## 2017-03-23 DIAGNOSIS — I255 Ischemic cardiomyopathy: Secondary | ICD-10-CM

## 2017-03-23 NOTE — Patient Instructions (Addendum)
Medication Instructions:  Your physician has recommended you make the following change in your medication:  1.  START Famotidine 20 mg Twice daily (This is over the counter)  Labwork: None  Testing/Procedures: None  Follow-Up: Your physician recommends that you schedule a follow-up appointment in: 1 month with Dr. Saunders Revel or APP  It was a pleasure seeing you today here in the office. Please do not hesitate to give [place] a call back if you have any further questions. Wheeler, BSN

## 2017-03-23 NOTE — Progress Notes (Signed)
Follow-up Outpatient Visit Date: 03/23/2017  Primary Care Provider: Margo [name], [place] [address]  Chief Complaint: Heartburn  HPI:  [name] is a 67 y.o. year-old male with history of coronary artery disease status post anterior STEMI (late presentation) in 08/1694, chronic systolic heart failure secondary to ischemic cardiomyopathy complicated by apical thrombus (resolved following anticoagulation 3 months), hypertension, and hyperlipidemia, who presents for follow-up of chest pain. I last saw [name] in October, at which time he continued to have "gas-like" pain in the left lower chest. The discomfort was positional and occasionally related to eating. We agreed to defer medication changes and additional changes at that time, given stress test last summer showing only anterior scar. [name] presented to the [facility] ED earlier this month (03/13/17) with atypical chest pain. EKG was stable and troponin I x 2 negative.  Today, [name] reports feeling well.  He continues to have occasional episodes of chest and upper abdominal pain that he attributes to heartburn and gas.  He has used over-the-counter antacids, including simethicone, in the past with some relief.  On the day of his ED visit earlier this month, he awoke with this discomfort.  It lasted throughout the day and was also accompanied by a "swimmy headed" feeling.  Of note, he was not hypotensive when he reached the ED.  While in the emergency department, the sensations resolved spontaneously.  He denies palpitations, shortness of breath, and edema.  He has been compliant with his medications.  He has not taken any sublingual nitroglycerin.  [name] recently began exercising on a stationary bike, riding about 15 miles a day.  He has not noticed any chest pain or shortness of breath with this  activity.  --------------------------------------------------------------------------------------------------  Cardiovascular History & Procedures: Cardiovascular Problems:  CAD s/p anterior STEMI with PCI to proximal LAD (03/2016)  Ischemic cardiomyopathy with severe LV dysfunction  LV apical thrombus  Risk Factors:  Known CAD, HTN, hyperlipidemia, male gender, and age > 37  Cath/PCI:  LHC/PCI (03/09/16): Late presenting STEMI with thrombotic occlusion of the proximal/mid LAD involving D1 and D2. Mild to moderate, non-obstructive CAD involving LCx and RCA. Successful IVUS-guided PCI to proximal and mid LAD using Integrity Resolute 3.0 x 38 mm DES (post-dilated up to 4.0 mm) with 0% residual stenosis and TIMI-3 flow. D2 was jailed by the LAD stent resulting in 70% ostial stenosis with TIMI-3 flow.  CV Surgery:  None  EP Procedures and Devices:  None  Non-Invasive Evaluation(s):  Pharmacologic MPI (08/28/16): Low to intermediate risk scan without any significant ischemia. There is a moderate in size, fixed mid and apical anteroseptal and apical defect. LVEF is 45% with apical anteroseptal and apical hypokinesis.  TTE (06/15/16): Normal LV size with mild LVH. LVEF 35-40% with mid and apical anterior/anteroseptal and apical akinesis. No evidence of LV thrombus. Grade 1 diastolic dysfunction. Mild biatrial enlargement. Normal RV size and function.  TTE (03/10/16): Moderate to severe LV dysfunction with LAD territory akinesis (LVEF 30-35%). Likely mural thrombus at LV apex. Limited evaluation of cardiac valve due to lack of parasternal windows; no significant valvular abnormality identified. Normal RV size and function.  Recent CV Pertinent Labs: Lab Results  Component Value Date   CHOL 104 05/26/2016   HDL 38 (L) 05/26/2016   LDLCALC 50 05/26/2016   TRIG 81 05/26/2016   CHOLHDL 4.0 03/09/2016   INR 4.1 06/16/2016   INR 2.31 03/19/2016   K 3.8 03/13/2017  MG 1.9 03/10/2016    BUN 12 03/13/2017   BUN 19 11/17/2016   CREATININE 0.72 03/13/2017    Past medical and surgical history were reviewed and updated in EPIC.  Current Meds  Medication Sig  . aspirin EC 81 MG tablet Take 1 tablet (81 mg total) by mouth daily.  Marland Kitchen atorvastatin (LIPITOR) 80 MG tablet Take 1 tablet (80 mg total) by mouth daily.  . carvedilol (COREG) 3.125 MG tablet Take 1 tablet (3.125 mg total) by mouth 2 (two) times daily.  . clopidogrel (PLAVIX) 75 MG tablet Take 1 tablet (75 mg total) by mouth daily.  Marland Kitchen losartan (COZAAR) 25 MG tablet Take 1 tablet (25 mg total) by mouth daily.  . nitroGLYCERIN (NITROSTAT) 0.4 MG SL tablet Place 1 tablet (0.4 mg total) under the tongue every 5 (five) minutes as needed for chest pain. Maximum of 3 doses.  Marland Kitchen spironolactone (ALDACTONE) 25 MG tablet Take 0.5 tablets (12.5 mg total) by mouth daily.    Allergies: Patient has no known allergies.  Social History   Socioeconomic History  . Marital status: Married    Spouse name: Not on file  . Number of children: Not on file  . Years of education: Not on file  . Highest education level: Not on file  Social Needs  . Financial resource strain: Not on file  . Food insecurity - worry: Not on file  . Food insecurity - inability: Not on file  . Transportation needs - medical: Not on file  . Transportation needs - non-medical: Not on file  Occupational History  . Not on file  Tobacco Use  . Smoking status: Former Smoker    Start date: 07/03/2007  . Smokeless tobacco: Never Used  . Tobacco comment: QUIT IN 2005  Substance and Sexual Activity  . Alcohol use: Yes    Alcohol/week: 1.8 oz    Types: 3 Cans of beer per week  . Drug use: Yes    Types: Marijuana    Comment: smokes pot  . Sexual activity: Not on file  Other Topics Concern  . Not on file  Social History Narrative   Living with family, Independent at baseline    Family History  Problem Relation Age of Onset  . Emphysema Mother   . Macular  degeneration Mother   . Heart attack Mother   . Heart attack Father     Review of Systems: A 12-system review of systems was performed and was negative except as noted in the HPI.  --------------------------------------------------------------------------------------------------  Physical Exam: BP 110/68 (BP Location: Left Arm, Patient Position: Sitting, Cuff Size: Normal)   Pulse (!) 54   Ht 5\' 10"  (1.778 m)   Wt 208 lb 4 oz (94.5 kg)   BMI 29.88 kg/m   General: Overweight man, seated comfortably in the exam room. HEENT: No conjunctival pallor or scleral icterus. Moist mucous membranes.  OP clear. Neck: Supple without lymphadenopathy, thyromegaly, JVD, or HJR.. Lungs: Normal work of breathing. Clear to auscultation bilaterally without wheezes or crackles. Heart: Regular rate and rhythm without murmurs, rubs, or gallops. Non-displaced PMI. Abd: Bowel sounds present. Soft, NT/ND without hepatosplenomegaly Ext: No lower extremity edema. Radial, PT, and DP pulses are 2+ bilaterally. Skin: Warm and dry without rash.  EKG:.  Sinus bradycardia (heart rate 54 bpm) with first-degree AV block (PR interval 220 ms), left axis deviation and nonspecific intraventricular conduction delay.  EKG is unchanged from prior tracings in our office as recently as 11/17/16.  However, EKG  in the ED earlier this month showed findings in the septal leads consistent with RBBB.  Question lead placement on recent ED EKG.  Lab Results  Component Value Date   WBC 6.5 03/13/2017   HGB 15.5 03/13/2017   HCT 45.1 03/13/2017   MCV 96.8 03/13/2017   PLT 196 03/13/2017    Lab Results  Component Value Date   NA 142 03/13/2017   K 3.8 03/13/2017   CL 106 03/13/2017   CO2 23 03/13/2017   BUN 12 03/13/2017   CREATININE 0.72 03/13/2017   GLUCOSE 100 (H) 03/13/2017   ALT 21 05/26/2016    Lab Results  Component Value Date   CHOL 104 05/26/2016   HDL 38 (L) 05/26/2016   LDLCALC 50 05/26/2016   TRIG 81  05/26/2016   CHOLHDL 4.0 03/09/2016    --------------------------------------------------------------------------------------------------  ASSESSMENT AND PLAN: Coronary artery disease with atypical chest pain Mr. Galan is now just over one year out from his late presenting anterior STEMI.  He has continued to have episodic chest and epigastric pain that he attributes to GERD.  While his pain is atypical, it is somewhat reminiscent of what he experienced leading up to his MI last year.  Myocardial perfusion stress test in July was somewhat reassuring, showing only scar in the mid and distal LAD distribution.  We have discussed further assessment with coronary angiography, though Mr. Pinney would like to avoid this if possible.  I have recommended that he begin famotidine 20 mg twice daily for empiric treatment of GERD.  If his symptoms resolve, we will continue close monitoring and treatment for presumed acid reflux.  If he does not have significant improvement in his atypical chest pain, we will need to proceed with catheterization, as this may represent his anginal equivalent.  In the meantime, we will continue with dual antiplatelet therapy as well as aggressive secondary prevention.  Chronic systolic heart failure secondary to ischemic cardiomyopathy Mr. Lesiak appears euvolemic and well compensated with NYHA class II heart failure symptoms.  We will continue current doses of carvedilol and losartan.  He is tolerating mild resting bradycardia well.  However, I am hesitant to up titrate his medications further at this time.  Hyperlipidemia Most recent LDL at goal.  Continue atorvastatin 80 mg daily.  Follow-up: Return to clinic in 1 month.  Nelva Bush, MD 03/23/2017 3:33 PM

## 2017-03-25 ENCOUNTER — Ambulatory Visit (INDEPENDENT_AMBULATORY_CARE_PROVIDER_SITE_OTHER): Payer: Medicare Other | Admitting: Family Medicine

## 2017-03-25 ENCOUNTER — Encounter: Payer: Self-pay | Admitting: Family Medicine

## 2017-03-25 VITALS — BP 100/60 | HR 55 | Temp 98.2°F | Ht 70.0 in | Wt 207.4 lb

## 2017-03-25 DIAGNOSIS — I25119 Atherosclerotic heart disease of native coronary artery with unspecified angina pectoris: Secondary | ICD-10-CM | POA: Diagnosis not present

## 2017-03-25 DIAGNOSIS — E785 Hyperlipidemia, unspecified: Secondary | ICD-10-CM | POA: Diagnosis not present

## 2017-03-25 DIAGNOSIS — R3911 Hesitancy of micturition: Secondary | ICD-10-CM

## 2017-03-25 DIAGNOSIS — K219 Gastro-esophageal reflux disease without esophagitis: Secondary | ICD-10-CM | POA: Diagnosis not present

## 2017-03-25 DIAGNOSIS — I10 Essential (primary) hypertension: Secondary | ICD-10-CM | POA: Diagnosis not present

## 2017-03-25 DIAGNOSIS — Z1211 Encounter for screening for malignant neoplasm of colon: Secondary | ICD-10-CM

## 2017-03-25 DIAGNOSIS — N529 Male erectile dysfunction, unspecified: Secondary | ICD-10-CM | POA: Diagnosis not present

## 2017-03-25 DIAGNOSIS — Z0000 Encounter for general adult medical examination without abnormal findings: Secondary | ICD-10-CM | POA: Diagnosis not present

## 2017-03-25 DIAGNOSIS — Z23 Encounter for immunization: Secondary | ICD-10-CM | POA: Diagnosis not present

## 2017-03-25 LAB — FECAL OCCULT BLOOD, GUAIAC: FECAL OCCULT BLD: NEGATIVE

## 2017-03-25 NOTE — Progress Notes (Signed)
Patient: Edward [name]., Male    DOB: Oct 16, 1950, 67 y.o.   MRN: [id number] Visit Date: 03/25/2017  Today's Provider: Vernie Murders, PA   Chief Complaint  Patient presents with  . Welcome to Medicare Physical Exam   Subjective:   Initial preventative physical exam Edward [name]. is a 67 y.o. male who presents today for his Initial Preventative Physical Exam. He feels fairly well. He reports exercising 15 miles per day on his exercise bike. He reports he is sleeping well.  Review of Systems  Constitutional: Negative.   HENT: Negative.   Eyes: Negative.   Respiratory: Negative.   Cardiovascular: Negative.   Gastrointestinal: Negative.        GERD  Endocrine: Negative.   Genitourinary: Negative.   Musculoskeletal: Negative.   Skin: Negative.   Allergic/Immunologic: Negative.   Neurological: Negative.   Hematological: Negative.   Psychiatric/Behavioral: Negative.    Social History   Socioeconomic History  . Marital status: Married    Spouse name: Not on file  . Number of children: Not on file  . Years of education: Not on file  . Highest education level: Not on file  Social Needs  . Financial resource strain: Not on file  . Food insecurity - worry: Not on file  . Food insecurity - inability: Not on file  . Transportation needs - medical: Not on file  . Transportation needs - non-medical: Not on file  Occupational History  . Not on file  Tobacco Use  . Smoking status: Former Smoker    Start date: 07/03/2007  . Smokeless tobacco: Never Used  . Tobacco comment: QUIT IN 2005  Substance and Sexual Activity  . Alcohol use: Yes    Alcohol/week: 1.8 oz    Types: 3 Cans of beer per week  . Drug use: Yes    Types: Marijuana    Comment: smokes pot  . Sexual activity: Not on file  Other Topics Concern  . Not on file  Social History Narrative   Living with family, Independent at baseline   Past Medical History:  Diagnosis Date  . Anxiety   . Apical mural  thrombus   . BPH (benign prostatic hyperplasia)   . CHF (congestive heart failure) ([place])   . Coronary artery disease   . Hyperlipidemia   . Hypertension     Patient Active Problem List   Diagnosis Date Noted  . Atypical chest pain 08/27/2016  . Chronic systolic heart failure ([place]) 04/07/2016  . Coronary artery disease involving native coronary artery of native heart with angina pectoris ([place]) 03/25/2016  . Apical mural thrombus following MI ([place]) 03/22/2016  . Encounter for therapeutic drug monitoring 03/22/2016  . Clostridium difficile diarrhea   . Ischemic cardiomyopathy   . Smoker   . Hyperlipidemia LDL goal <70   . Encounter for anticoagulation discussion and counseling   . Anxiety 07/30/2014  . Arthritis of neck 07/30/2014  . ED (erectile dysfunction) of organic origin 07/30/2014  . Personal history of tobacco use, presenting hazards to health 07/30/2014  . Adiposity 07/30/2014  . Delayed onset of urination 07/30/2014  . Brachial neuritis 08/26/2009  . Actinic keratoses 07/30/2008  . Essential (primary) hypertension 10/29/2005   Past Surgical History:  Procedure Laterality Date  . CORONARY ANGIOGRAPHY N/A 03/09/2016   Procedure: Coronary Angiography;  Surgeon: Nelva Bush, MD;  Location: [facility];  Service: Cardiovascular;  Laterality: N/A;  . CORONARY STENT INTERVENTION N/A 03/09/2016  Procedure: Coronary Stent Intervention;  Surgeon: Nelva Bush, MD;  Location: [facility];  Service: Cardiovascular;  Laterality: N/A;  . HERNIA REPAIR  2005  . HYDROCELE EXCISION      His family history includes Emphysema in his mother; Heart attack in his father and mother; Macular degeneration in his mother.     Current Outpatient Medications:  .  aspirin EC 81 MG tablet, Take 1 tablet (81 mg total) by mouth daily., Disp: 90 tablet, Rfl: 3 .  atorvastatin (LIPITOR) 80 MG tablet, Take 1 tablet (80 mg total) by mouth daily., Disp: 7 tablet, Rfl: 0 .   carvedilol (COREG) 3.125 MG tablet, Take 1 tablet (3.125 mg total) by mouth 2 (two) times daily., Disp: 14 tablet, Rfl: 0 .  clopidogrel (PLAVIX) 75 MG tablet, Take 1 tablet (75 mg total) by mouth daily., Disp: 7 tablet, Rfl: 0 .  losartan (COZAAR) 25 MG tablet, Take 1 tablet (25 mg total) by mouth daily., Disp: 180 tablet, Rfl: 2 .  spironolactone (ALDACTONE) 25 MG tablet, Take 0.5 tablets (12.5 mg total) by mouth daily., Disp: 45 tablet, Rfl: 2 .  nitroGLYCERIN (NITROSTAT) 0.4 MG SL tablet, Place 1 tablet (0.4 mg total) under the tongue every 5 (five) minutes as needed for chest pain. Maximum of 3 doses., Disp: 45 tablet, Rfl: 2   Patient Care Team: Ashauna Bertholf, Vickki Muff, PA as PCP - General (Physician Assistant) Edrick Kins, MD as Rounding Team (Internal Medicine) End, Harrell Gave, MD as Consulting Physician (Cardiology)     Objective:   Vitals: BP 100/60 (BP Location: Right Arm, Patient Position: Sitting, Cuff Size: Normal)   Pulse (!) 55   Temp 98.2 F (36.8 C) (Oral)   Ht 5\' 10"  (1.778 m)   Wt 207 lb 6.4 oz (94.1 kg)   SpO2 96%   BMI 29.76 kg/m   Physical Exam  Constitutional: He is oriented to person, place, and time. He appears well-developed and well-nourished.  HENT:  Head: Normocephalic and atraumatic.  Right Ear: External ear normal.  Left Ear: External ear normal.  Nose: Nose normal.  Mouth/Throat: Oropharynx is clear and moist.  Eyes: Conjunctivae and EOM are normal. Pupils are equal, round, and reactive to light. Right eye exhibits no discharge.  Neck: Normal range of motion. Neck supple. No tracheal deviation present. No thyromegaly present.  Cardiovascular: Normal rate, regular rhythm, normal heart sounds and intact distal pulses.  No murmur heard. Pulmonary/Chest: Effort normal and breath sounds normal. No respiratory distress. He has no wheezes. He has no rales. He exhibits no tenderness.  Abdominal: Soft. He exhibits no distension and no mass. There is  no tenderness. There is no rebound and no guarding.  Genitourinary: Rectum normal, prostate normal and penis normal. Rectal exam shows guaiac negative stool.  Musculoskeletal: Normal range of motion. He exhibits no edema or tenderness.  Lymphadenopathy:    He has no cervical adenopathy.  Neurological: He is alert and oriented to person, place, and time. He has normal reflexes. No cranial nerve deficit. He exhibits normal muscle tone. Coordination normal.  Skin: Skin is warm and dry. No rash noted. No erythema.  Psychiatric: He has a normal mood and affect. His behavior is normal. Judgment and thought content normal.   Activities of Daily Living In your present state of health, do you have any difficulty performing the following activities: 03/25/2017 08/27/2016  Hearing? N N  Vision? N N  Difficulty concentrating or making decisions? N N  Walking or climbing stairs?  N N  Dressing or bathing? N N  Doing errands, shopping? N N  Some recent data might be hidden   Fall Risk Assessment Fall Risk  03/25/2017 06/21/2016  Falls in the past year? No No   Depression Screen PHQ 2/9 Scores 03/25/2017 09/09/2016 06/21/2016 12/16/2015  PHQ - 2 Score 1 1 0 0  PHQ- 9 Score 1 3 2  -   Cognitive Testing - 6-CIT  Correct? Score   What year is it? yes 0 0 or 4  What month is it? yes 0 0 or 3  Memorize:    Pia Mau,  43,  [place],      What time is it? (within 1 hour) yes 0 0 or 3  Count backwards from 20 yes 0 0, 2, or 4  Name the months of the year yes 0 0, 2, or 4  Repeat name & address above yes 3 0, 2, 4, 6, 8, or 10       TOTAL SCORE  3/28   Interpretation:  Normal  Normal (0-7) Abnormal (8-28)   Assessment & Plan:     Initial Preventative Physical Exam  Reviewed patient's Family Medical History Reviewed and updated list of patient's medical providers Assessment of cognitive impairment was done Assessed patient's functional ability Established a written schedule for health  screening [place] Completed and Reviewed  Exercise Activities and Dietary recommendations Goals    Recommend 30 minute exercise 3-4 days a week and some weight loss.     Immunization History  Administered Date(s) Administered  . Tdap 11/23/2012   Health Maintenance  Topic Date Due  . COLON CANCER SCREENING ANNUAL FOBT  12/15/2016  . PNA vac Low Risk Adult (1 of 2 - PCV13) 12/15/2016  . INFLUENZA VACCINE  11/25/2017 (Originally 09/01/2016)  . TETANUS/TDAP  11/24/2022  . Hepatitis C Screening  Completed  . HIV Screening  Completed    Discussed health benefits of physical activity, and encouraged him to engage in regular exercise appropriate for his age and condition.    ------------------------------------------------------------------------------------------------------------ 1. Welcome to Medicare preventive visit General health stable. Medicare screening essentially normal. Given anticipatory guidance. Will give Prevnar-13 and schedule for colonoscopy. EKG showed bradycardia with signs of old infarct. Had follow up with Dr. Saunders Revel (cardiologist) on 03-23-17 without acute changes.  - EKG 12-Lead  2. Essential (primary) hypertension Stable and controlled by Spironolactone 25 mg 1/2 tablet daily with Coreg 3.125 mg BID and Losartan 25 mg qd. Check routine labs and continue follow up with cardiologist. - TSH - Comprehensive metabolic panel - CBC with Differential/Platelet  3. Hyperlipidemia LDL goal <70 With history of STEMI in Feb. 2018, should get LDL below 70. Continues Lipitor 80 mg qd. Recheck labs and recommend low fat diet with weight loss. Should get regular exercise 3-4 days a week for 30 minutes. - Lipid panel - TSH - Comprehensive metabolic panel  4. Delayed onset of urination Some slow down of urine flow recently. DRE unremarkable. Will get PSA. Possible BPH. If persistent, may need urology referral. - PSA  5. Gastroesophageal reflux disease,  esophagitis presence not specified No heart burn today. ER visit on 03-13-17 for chest pain was suspected GERD symptoms. May use antacid or Pepcid prn. Recheck CBC. No melena, hematemesis or hematochezia. - CBC with Differential/Platelet  6. Coronary artery disease involving native coronary artery of native heart with angina pectoris [facility]) Had an anterior STIMI in Feb. 2018 with chronic systolic heart  failure secondary to ischemic cardiomyopathy. Recent ER visit due to chest pain on 03-13-17 was negative for ischemic event. Final diagnosis was probable GERD. Had recheck with Dr. Saunders Revel (cardiologist) on 03-23-17. Will recheck labs and continue NTG SL prn, Coreg 3.125 mg BID. Follow up with Dr. Saunders Revel if any further chest pains. - Lipid panel - CBC with Differential/Platelet  7. ED (erectile dysfunction) of organic origin Having difficulty maintaining or attaining erections to complete satisfactory intercourse. Denies decrease in libido. DRE negative for prostate masses. Will check PSA. Should not use Viagra or Cialis with Nitroglycerin. - PSA  8. Encounter for screening colonoscopy - Ambulatory referral to Gastroenterology  9. Need for Streptococcus pneumoniae vaccination - Pneumococcal conjugate vaccine 13-valent IM     Vernie Murders, PA  [facility]

## 2017-03-26 LAB — LIPID PANEL
Chol/HDL Ratio: 2.3 ratio (ref 0.0–5.0)
Cholesterol, Total: 102 mg/dL (ref 100–199)
HDL: 44 mg/dL (ref >39)
LDL Calculated: 48 mg/dL (ref 0–99)
Triglycerides: 51 mg/dL (ref 0–149)
VLDL CHOLESTEROL CAL: 10 mg/dL (ref 5–40)

## 2017-03-26 LAB — COMPREHENSIVE METABOLIC PANEL
A/G RATIO: 2.1 (ref 1.2–2.2)
ALT: 22 IU/L (ref 0–44)
AST: 18 IU/L (ref 0–40)
Albumin: 4.5 g/dL (ref 3.6–4.8)
Alkaline Phosphatase: 41 IU/L (ref 39–117)
BILIRUBIN TOTAL: 0.8 mg/dL (ref 0.0–1.2)
BUN/Creatinine Ratio: 14 (ref 10–24)
BUN: 13 mg/dL (ref 8–27)
CALCIUM: 9.2 mg/dL (ref 8.6–10.2)
CO2: 24 mmol/L (ref 20–29)
Chloride: 103 mmol/L (ref 96–106)
Creatinine, Ser: 0.91 mg/dL (ref 0.76–1.27)
GFR calc Af Amer: 102 mL/min/{1.73_m2} (ref >59)
GFR calc non Af Amer: 88 mL/min/{1.73_m2} (ref >59)
Globulin, Total: 2.1 g/dL (ref 1.5–4.5)
Glucose: 107 mg/dL — ABNORMAL HIGH (ref 65–99)
Potassium: 4.6 mmol/L (ref 3.5–5.2)
SODIUM: 141 mmol/L (ref 134–144)
Total Protein: 6.6 g/dL (ref 6.0–8.5)

## 2017-03-26 LAB — CBC WITH DIFFERENTIAL/PLATELET
BASOS: 0 %
Basophils Absolute: 0 10*3/uL (ref 0.0–0.2)
EOS (ABSOLUTE): 0.1 10*3/uL (ref 0.0–0.4)
EOS: 2 %
HEMATOCRIT: 42.5 % (ref 37.5–51.0)
HEMOGLOBIN: 14.6 g/dL (ref 13.0–17.7)
Immature Grans (Abs): 0 10*3/uL (ref 0.0–0.1)
Immature Granulocytes: 0 %
LYMPHS ABS: 1.6 10*3/uL (ref 0.7–3.1)
Lymphs: 29 %
MCH: 32.4 pg (ref 26.6–33.0)
MCHC: 34.4 g/dL (ref 31.5–35.7)
MCV: 94 fL (ref 79–97)
MONOCYTES: 8 %
MONOS ABS: 0.5 10*3/uL (ref 0.1–0.9)
NEUTROS ABS: 3.4 10*3/uL (ref 1.4–7.0)
Neutrophils: 61 %
Platelets: 209 10*3/uL (ref 150–379)
RBC: 4.5 x10E6/uL (ref 4.14–5.80)
RDW: 13.2 % (ref 12.3–15.4)
WBC: 5.6 10*3/uL (ref 3.4–10.8)

## 2017-03-26 LAB — TSH: TSH: 1.31 u[IU]/mL (ref 0.450–4.500)

## 2017-03-26 LAB — PSA: Prostate Specific Ag, Serum: 0.5 ng/mL (ref 0.0–4.0)

## 2017-03-29 ENCOUNTER — Other Ambulatory Visit: Payer: Self-pay

## 2017-03-31 ENCOUNTER — Other Ambulatory Visit: Payer: Self-pay

## 2017-03-31 ENCOUNTER — Telehealth: Payer: Self-pay

## 2017-03-31 DIAGNOSIS — Z1211 Encounter for screening for malignant neoplasm of colon: Secondary | ICD-10-CM

## 2017-03-31 NOTE — Telephone Encounter (Signed)
Gastroenterology Pre-Procedure Review  Request Date:  Requesting Physician: Dr.   PATIENT REVIEW QUESTIONS: The patient responded to the following health history questions as indicated:    1. Are you having any GI issues? No 2. Do you have a personal history of Polyps? no 3. Do you have a family history of Colon Cancer or Polyps? no 4. Diabetes Mellitus? no 5. Joint replacements in the past 12 months?no 6. Major health problems in the past 3 months?no 7. Any artificial heart valves, MVP, or defibrillator?no    MEDICATIONS & ALLERGIES:    Patient reports the following regarding taking any anticoagulation/antiplatelet therapy:   Plavix, Coumadin, Eliquis, Xarelto, Lovenox, Pradaxa, Brilinta, or Effient? yes (Plavix 75mg ) Aspirin? yes (ASA 81mg )  Patient confirms/reports the following medications:  Current Outpatient Medications  Medication Sig Dispense Refill  . aspirin EC 81 MG tablet Take 1 tablet (81 mg total) by mouth daily. 90 tablet 3  . atorvastatin (LIPITOR) 80 MG tablet Take 1 tablet (80 mg total) by mouth daily. 7 tablet 0  . carvedilol (COREG) 3.125 MG tablet Take 1 tablet (3.125 mg total) by mouth 2 (two) times daily. 14 tablet 0  . clopidogrel (PLAVIX) 75 MG tablet Take 1 tablet (75 mg total) by mouth daily. 7 tablet 0  . losartan (COZAAR) 25 MG tablet Take 1 tablet (25 mg total) by mouth daily. 180 tablet 2  . nitroGLYCERIN (NITROSTAT) 0.4 MG SL tablet Place 1 tablet (0.4 mg total) under the tongue every 5 (five) minutes as needed for chest pain. Maximum of 3 doses. 45 tablet 2  . spironolactone (ALDACTONE) 25 MG tablet Take 0.5 tablets (12.5 mg total) by mouth daily. 45 tablet 2   No current facility-administered medications for this visit.     Patient confirms/reports the following allergies:  No Known Allergies  No orders of the defined types were placed in this encounter.   AUTHORIZATION INFORMATION Primary Insurance: 1D#: Group #:  Secondary  Insurance: 1D#: Group #:  SCHEDULE INFORMATION: Date: 04/22/17 Time: Location: [facility]

## 2017-04-06 ENCOUNTER — Telehealth: Payer: Self-pay

## 2017-04-06 ENCOUNTER — Telehealth: Payer: Self-pay | Admitting: *Deleted

## 2017-04-06 NOTE — Telephone Encounter (Signed)
Pt notified per Dr. Harrell Gave End, he may stop his Plavix 5 days prior to colonoscopy and restart 1 day after. Please review clearance in Media.

## 2017-04-06 NOTE — Telephone Encounter (Signed)
Blood Thinner Information Request Form signed and completed by Dr End and faxed to [facility]. Patient may stop Plavix 75 mg 3 days prior to procedure and restart blood thinner 1 day after procedure or can restart when felt safe to do so by Dr Vicente Males.

## 2017-04-18 ENCOUNTER — Encounter: Payer: Self-pay | Admitting: Physician Assistant

## 2017-04-18 ENCOUNTER — Ambulatory Visit (INDEPENDENT_AMBULATORY_CARE_PROVIDER_SITE_OTHER): Payer: Medicare Other | Admitting: Physician Assistant

## 2017-04-18 VITALS — BP 94/60 | HR 59 | Ht 70.0 in | Wt 210.5 lb

## 2017-04-18 DIAGNOSIS — I10 Essential (primary) hypertension: Secondary | ICD-10-CM | POA: Diagnosis not present

## 2017-04-18 DIAGNOSIS — I236 Thrombosis of atrium, auricular appendage, and ventricle as current complications following acute myocardial infarction: Secondary | ICD-10-CM | POA: Diagnosis not present

## 2017-04-18 DIAGNOSIS — I255 Ischemic cardiomyopathy: Secondary | ICD-10-CM

## 2017-04-18 DIAGNOSIS — Z01810 Encounter for preprocedural cardiovascular examination: Secondary | ICD-10-CM | POA: Diagnosis not present

## 2017-04-18 DIAGNOSIS — E785 Hyperlipidemia, unspecified: Secondary | ICD-10-CM

## 2017-04-18 DIAGNOSIS — I2510 Atherosclerotic heart disease of native coronary artery without angina pectoris: Secondary | ICD-10-CM | POA: Diagnosis not present

## 2017-04-18 NOTE — Patient Instructions (Addendum)
Medication Instructions:  Your physician recommends that you continue on your current medications as directed. Please refer to the Current Medication list given to you today.   Labwork: none  Testing/Procedures: none  Follow-Up: Your physician wants you to follow-up in: 6 months with Dr. Saunders Revel. You will receive a reminder letter in the mail two months in advance. If you don't receive a letter, please call our office to schedule the follow-up appointment.   Any Other Special Instructions Will Be Listed Below (If Applicable). Check BP 1.5-2 hours after taking blood pressure medications. Record readings and call with them at the end of the week.      If you need a refill on your cardiac medications before your next appointment, please call your pharmacy.

## 2017-04-18 NOTE — Progress Notes (Signed)
Cardiology Office Note Date:  04/18/2017  Patient ID:  Edward Kopera., DOB [birth year], MRN [id number] PCP:  Margo Common, PA  Cardiologist:  Dr. Saunders Revel, MD    Chief Complaint: Follow up  History of Present Illness: Edward [name]. is a 67 y.o. male with history of CAD s/p late-presenting anterior STEMI in 03/1192, chronic systolic CHF secondary to ICM complicated by apical thrombus 9resolved following anticoagulation x 3 months), HTN, and HLD who presents for follow up of chest pain.   He was admitted in 03/2016 with a late-presenting anterior wall STEMI. Cardiac cath on 03/09/2016 showed thrombotic occlusion of the proximal/mid LAD involving D1 and D2, mild to moderate nonobstructive CAD involving the LCx and RCA. He underwent successful IVUS-guided PCI to proximal and mid LAD using Integrity Resolute 3.0 x 38 mm DES with 0% residual stenosis and TIMI-3 flow. D2 was jailed by the LAD stent resulting in 70% ostial stenosis with TIMI-3 flow. TTE 03/10/16 showed an EF of 30-35% with LAD territory AK, likely mural thrombus at the LV apex. Repeat echo in 06/2016 showed EF 35-40%, mid and apical anterior/anteroseptal AK, no evidence of LV thrombus, Gr1DD, mild biatrial enlargement, normal RVSF. Myoview in 08/2016 showed no significant ischemia, fixed defect in the mid and distal LAD distribution, LVEF 45%, low to intermediate risk study. He was seen in the ED on 03/13/17 with atypical chest pain. EKG was stable and troponin was negative x 2. He was seen in the office for follow up on 03/23/17 and reported occasional episodes of chest and upper abdominal pain that he atributed to heartburn and gas. He was noted to have been riding a stationary bike for 15 miles per day without symptoms. Weight of 208 pounds. He was started on Pepcid for possible empiric treatment of GERD. He is scheduled for a colonoscopy later this month and has been advised to hold Plavix prior to the procedure.   He comes in doing well  today.  No further pains since starting famotidine as above.  Reflux/gas sensation completely resolved.  He continues to ride his stationary bike for 15 miles per day without any symptoms concerning for angina or shortness of breath.  He has been compliant with medications.  Over this past weekend, [place] Day, the patient is an [place] and he drank approximately 7 beers on 04/17/17 followed by foods higher in salt that he typically does.  Weight at home 2 days ago was 198 pounds with weight this morning at home of 204 pounds.  He denies any orthopnea, early satiety, lower extremity swelling, abdominal distention, PND, or cough.  No dizziness, presyncope, or syncope.  He does not check his blood pressure at home.  He has stopped his Plavix for planned upcoming colonoscopy (last dose 04/17/17).  He does not have any concerns at this time.   Past Medical History:  Diagnosis Date  . Anxiety   . Apical mural thrombus    a. s/p 3 months of anticoagulation  . BPH (benign prostatic hyperplasia)   . Coronary artery disease    a. LHC 2/18: thrombotic occlusion of p-mLAD involving D1/D2, mild to mod nonobs CAD of LCx & RCA, successful IVUS-guided PCI to p-mLAD (Integrity Resolute 3.0 x 38 mm DES) w/ 0% residual stenosis & TIMI-3 flow, D2 jailed by LAD stent resulting in 70% ostial stenosis w/ TIMI-3 flow. b. MV 7/18: no sig isch, mod in size fixed mid & apical anterosep * apical defect, EF 45%,  low to mod risk  . Hyperlipidemia   . Hypertension   . Ischemic cardiomyopathy    a. TTE 2/18:  EF of 30-35% with LAD territory AK, likely mural thrombus at the LV apex; b. TTE 5/18: EF 35-40%, mid and apical anterior/anteroseptal AK, no evidence of LV thrombus, Gr1DD, mild biatrial enlargement, normal RVSF    Past Surgical History:  Procedure Laterality Date  . CORONARY ANGIOGRAPHY N/A 03/09/2016   Procedure: Coronary Angiography;  Surgeon: Nelva Bush, MD;  Location: [facility];  Service:  Cardiovascular;  Laterality: N/A;  . CORONARY STENT INTERVENTION N/A 03/09/2016   Procedure: Coronary Stent Intervention;  Surgeon: Nelva Bush, MD;  Location: [facility];  Service: Cardiovascular;  Laterality: N/A;  . HERNIA REPAIR  2005  . HYDROCELE EXCISION      Current Meds  Medication Sig  . aspirin EC 81 MG tablet Take 1 tablet (81 mg total) by mouth daily.  Marland Kitchen atorvastatin (LIPITOR) 80 MG tablet Take 1 tablet (80 mg total) by mouth daily.  . carvedilol (COREG) 3.125 MG tablet Take 1 tablet (3.125 mg total) by mouth 2 (two) times daily.  . clopidogrel (PLAVIX) 75 MG tablet Take 1 tablet (75 mg total) by mouth daily.  . famotidine (PEPCID) 20 MG tablet Take 20 mg by mouth 2 (two) times daily.  Marland Kitchen losartan (COZAAR) 25 MG tablet Take 1 tablet (25 mg total) by mouth daily.  . nitroGLYCERIN (NITROSTAT) 0.4 MG SL tablet Place 1 tablet (0.4 mg total) under the tongue every 5 (five) minutes as needed for chest pain. Maximum of 3 doses.  Marland Kitchen spironolactone (ALDACTONE) 25 MG tablet Take 0.5 tablets (12.5 mg total) by mouth daily.    Allergies:   Patient has no known allergies.   Social History:  The patient  reports that he has quit smoking. He started smoking about 9 years ago. he has never used smokeless tobacco. He reports that he drinks about 1.8 oz of alcohol per week. He reports that he uses drugs. Drug: Marijuana.   Family History:  The patient's family history includes Emphysema in his mother; Heart attack in his father and mother; Macular degeneration in his mother.  ROS:   Review of Systems  Constitutional: Negative for chills, diaphoresis, fever, malaise/fatigue and weight loss.  HENT: Negative for congestion.   Eyes: Negative for discharge and redness.  Respiratory: Negative for cough, hemoptysis, sputum production, shortness of breath and wheezing.   Cardiovascular: Negative for chest pain, palpitations, orthopnea, claudication, leg swelling and PND.    Gastrointestinal: Positive for heartburn. Negative for abdominal pain, blood in stool, melena, nausea and vomiting.       Much improved heartburn  Genitourinary: Negative for hematuria.  Musculoskeletal: Negative for falls and myalgias.  Skin: Negative for rash.  Neurological: Negative for dizziness, tingling, tremors, sensory change, speech change, focal weakness, loss of consciousness and weakness.  Endo/Heme/Allergies: Does not bruise/bleed easily.  Psychiatric/Behavioral: Negative for substance abuse. The patient is not nervous/anxious.   All other systems reviewed and are negative.    PHYSICAL EXAM:  VS:  BP 94/60 (BP Location: Left Arm, Patient Position: Sitting, Cuff Size: Normal)   Pulse (!) 59   Ht 5\' 10"  (1.778 m)   Wt 210 lb 8 oz (95.5 kg)   BMI 30.20 kg/m  BMI: Body mass index is 30.2 kg/m.  Physical Exam  Constitutional: He is oriented to person, place, and time. He appears well-developed and well-nourished.  HENT:  Head: Normocephalic and  atraumatic.  Eyes: Right eye exhibits no discharge. Left eye exhibits no discharge.  Neck: Normal range of motion. No JVD present.  Cardiovascular: Regular rhythm, S1 normal, S2 normal and normal heart sounds. Bradycardia present. Exam reveals no distant heart sounds, no friction rub, no midsystolic click and no opening snap.  No murmur heard. Pulses:      Posterior tibial pulses are 2+ on the right side, and 2+ on the left side.  Pulmonary/Chest: Effort normal and breath sounds normal. No respiratory distress. He has no decreased breath sounds. He has no wheezes. He has no rales. He exhibits no tenderness.  Abdominal: Soft. He exhibits no distension. There is no tenderness.  Musculoskeletal: He exhibits no edema.  Neurological: He is alert and oriented to person, place, and time.  Skin: Skin is warm and dry. No cyanosis. Nails show no clubbing.  Psychiatric: He has a normal mood and affect. His speech is normal and behavior is  normal. Judgment and thought content normal.     EKG:  Was ordered and interpreted by me today. Shows sinus bradycardia, 59 bpm, left axis deviation, nonspecific intraventricular conduction delay, poor R wave progression  Recent Labs: 03/25/2017: ALT 22; BUN 13; Creatinine, Ser 0.91; Hemoglobin 14.6; Platelets 209; Potassium 4.6; Sodium 141; TSH 1.310  03/25/2017: Chol/HDL Ratio 2.3; Cholesterol, Total 102; HDL 44; LDL Calculated 48; Triglycerides 51   CrCl cannot be calculated (Patient's most recent lab result is older than the maximum 21 days allowed.).   Wt Readings from Last 3 Encounters:  04/18/17 210 lb 8 oz (95.5 kg)  03/25/17 207 lb 6.4 oz (94.1 kg)  03/23/17 208 lb 4 oz (94.5 kg)     Other studies reviewed: Additional studies/records reviewed today include: summarized above  ASSESSMENT AND PLAN:  1. CAD of native coronary arteries without angina: No symptoms concerning for angina.  The addition of famotidine has resolved the patient's symptoms.  He continues to exercise on a stationary bike riding approximately 15 miles per day without any symptoms.  He will continue dual antiplatelet therapy with aspirin 81 mg daily and Plavix 75 mg daily.  He has held his Plavix as of 04/17/17 as detailed below for scheduled colonoscopy.  He will resume Plavix when deemed safe by the GI team.  No plans for further ischemic evaluation at this time.  Continue aggressive risk factor modification and secondary prevention.  2. Chronic systolic CHF due to ICM: Weight is relatively stable when compared to our scale.  Patient does report a weight gain over the weekend from 198-->204 pounds.  He indicates this is in the setting of [place] Day with excessive alcohol intake and foods high in salt.  He does not appear to be decompensated at this time.  He will keep a watchful eye on his weight.  He reports his weight typically does this when he is "bad."  For now, continue carvedilol 3.125 mg twice daily,  losartan 25 mg daily, and spironolactone 12.5 mg daily.  CHF education provided.  Daily weights.  3. History of mural thrombus: Status post 3 months of anticoagulation. No evidence of thrombus on follow up echo in 06/2016.  4. HTN: BP on the soft side today at 94/60.  He does not check his blood pressure at home.  Discussed with patient the possibility of decreasing his losartan, however the patient prefers to continue current medications at current dose and he indicates he will pick up a home BP cuff to check his blood pressure  readings and call us with these readings later this week.  Should blood pressure continue to run in the soft side I would continue to recommend we decrease losartan to 12.5 mg daily.  At this time, we will continue Coreg 3.125 mg twice daily, losartan 25 mg daily, and spironolactone 12.5 mg daily per patient's request.  5. Preprocedure cardiac evaluation: Has previously been advised by his primary cardiologist to hold Plavix for 3 days prior to procedure.  Patient's last dose of Plavix was 04/17/17.  He is able to achieve greater than 4 METs.  He has low to moderate risk for low risk procedure.  6. HLD: LDL from 03/25/2017 at goal with normal LFT. Continue Lipitor 80 mg daily.   Disposition: F/u with Dr. Saunders Revel in 6 months.  Current medicines are reviewed at length with the patient today.  The patient did not have any concerns regarding medicines.  Signed, Christell Faith, PA-C 04/18/2017 1:15 PM     [address] [place], [place] [address] [phone]

## 2017-04-22 ENCOUNTER — Ambulatory Visit: Payer: Medicare Other | Admitting: Certified Registered Nurse Anesthetist

## 2017-04-22 ENCOUNTER — Ambulatory Visit
Admission: RE | Admit: 2017-04-22 | Discharge: 2017-04-22 | Disposition: A | Discharge status: Home / Self Care | Payer: Medicare Other | Source: Ambulatory Visit | Attending: Gastroenterology | Admitting: Gastroenterology

## 2017-04-22 ENCOUNTER — Encounter
Admission: RE | Disposition: A | Discharge status: Home / Self Care | Payer: Self-pay | Source: Ambulatory Visit | Attending: Gastroenterology

## 2017-04-22 ENCOUNTER — Encounter: Payer: Self-pay | Admitting: *Deleted

## 2017-04-22 DIAGNOSIS — Z7902 Long term (current) use of antithrombotics/antiplatelets: Secondary | ICD-10-CM | POA: Diagnosis not present

## 2017-04-22 DIAGNOSIS — E785 Hyperlipidemia, unspecified: Secondary | ICD-10-CM | POA: Diagnosis not present

## 2017-04-22 DIAGNOSIS — D122 Benign neoplasm of ascending colon: Secondary | ICD-10-CM | POA: Diagnosis not present

## 2017-04-22 DIAGNOSIS — I252 Old myocardial infarction: Secondary | ICD-10-CM | POA: Insufficient documentation

## 2017-04-22 DIAGNOSIS — I255 Ischemic cardiomyopathy: Secondary | ICD-10-CM | POA: Diagnosis not present

## 2017-04-22 DIAGNOSIS — Z1211 Encounter for screening for malignant neoplasm of colon: Secondary | ICD-10-CM | POA: Diagnosis not present

## 2017-04-22 DIAGNOSIS — K635 Polyp of colon: Secondary | ICD-10-CM | POA: Diagnosis not present

## 2017-04-22 DIAGNOSIS — Z87891 Personal history of nicotine dependence: Secondary | ICD-10-CM | POA: Diagnosis not present

## 2017-04-22 DIAGNOSIS — Z955 Presence of coronary angioplasty implant and graft: Secondary | ICD-10-CM | POA: Diagnosis not present

## 2017-04-22 DIAGNOSIS — I2510 Atherosclerotic heart disease of native coronary artery without angina pectoris: Secondary | ICD-10-CM | POA: Insufficient documentation

## 2017-04-22 DIAGNOSIS — Z79899 Other long term (current) drug therapy: Secondary | ICD-10-CM | POA: Insufficient documentation

## 2017-04-22 DIAGNOSIS — Z7982 Long term (current) use of aspirin: Secondary | ICD-10-CM | POA: Insufficient documentation

## 2017-04-22 DIAGNOSIS — I10 Essential (primary) hypertension: Secondary | ICD-10-CM | POA: Insufficient documentation

## 2017-04-22 HISTORY — DX: Acute myocardial infarction, unspecified: I21.9

## 2017-04-22 HISTORY — PX: COLONOSCOPY WITH PROPOFOL: SHX5780

## 2017-04-22 SURGERY — COLONOSCOPY WITH PROPOFOL
Anesthesia: General

## 2017-04-22 MED ORDER — EPHEDRINE SULFATE 50 MG/ML IJ SOLN
INTRAMUSCULAR | Status: AC
  Filled 2017-04-22: qty 1

## 2017-04-22 MED ORDER — PROPOFOL 10 MG/ML IV BOLUS
INTRAVENOUS | Status: DC | PRN
  Administered 2017-04-22 (×2): 20 mg via INTRAVENOUS
  Administered 2017-04-22: 70 mg via INTRAVENOUS
  Administered 2017-04-22 (×2): 20 mg via INTRAVENOUS

## 2017-04-22 MED ORDER — EPHEDRINE SULFATE 50 MG/ML IJ SOLN
INTRAMUSCULAR | Status: DC | PRN
  Administered 2017-04-22 (×3): 10 mg via INTRAVENOUS

## 2017-04-22 MED ORDER — SODIUM CHLORIDE 0.9 % IV SOLN
INTRAVENOUS | Status: DC
  Administered 2017-04-22 (×2): via INTRAVENOUS

## 2017-04-22 MED ORDER — PHENYLEPHRINE HCL 10 MG/ML IJ SOLN
INTRAMUSCULAR | Status: DC | PRN
  Administered 2017-04-22: 50 ug via INTRAVENOUS

## 2017-04-22 MED ORDER — LIDOCAINE HCL (PF) 2 % IJ SOLN
INTRAMUSCULAR | Status: AC
  Filled 2017-04-22: qty 10

## 2017-04-22 MED ORDER — PROPOFOL 10 MG/ML IV BOLUS
INTRAVENOUS | Status: AC
  Filled 2017-04-22: qty 40

## 2017-04-22 NOTE — Anesthesia Procedure Notes (Signed)
Procedure Name: MAC Date/Time: 04/22/2017 11:00 AM Performed by: Rudean Hitt, CRNA Pre-anesthesia Checklist: Patient identified, Emergency Drugs available, Suction available, Patient being monitored and Timeout performed Patient Re-evaluated:Patient Re-evaluated prior to induction Oxygen Delivery Method: Nasal cannula

## 2017-04-22 NOTE — Op Note (Addendum)
[facility] Patient Name: Edward [name] Procedure Date: 04/22/2017 10:40 AM MRN: [id number] Account #: [id number] Date of Birth: [birth year] Admit Type: Outpatient Age: 67 Room: [facility] ENDO ROOM 4 Gender: Male Note Status: Finalized Procedure:            Colonoscopy Indications:          Screening for colorectal malignant neoplasm Providers:            Jonathon Bellows MD, MD Referring MD:         Vickki Muff. Chrismon, MD (Referring MD) Medicines:            Monitored Anesthesia Care Complications:        No immediate complications. Procedure:            Pre-Anesthesia Assessment:                       - Prior to the procedure, a History and Physical was                        performed, and patient medications, allergies and                        sensitivities were reviewed. The patient's tolerance of                        previous anesthesia was reviewed.                       - The risks and benefits of the procedure and the                        sedation options and risks were discussed with the                        patient. All questions were answered and informed                        consent was obtained.                       - ASA Grade Assessment: II - A patient with mild                        systemic disease.                       - Prior Anticoagulants: The patient has taken Plavix                        (clopidogrel), last dose was 5 days prior to procedure.                       After obtaining informed consent, the colonoscope was                        passed under direct vision. Throughout the procedure,                        the patient's blood pressure, pulse, and oxygen  saturations were monitored continuously. The                        Colonoscope was introduced through the anus and                        advanced to the the cecum, identified by the                        appendiceal orifice, IC valve and  transillumination.                        The colonoscopy was performed with ease. The patient                        tolerated the procedure well. The quality of the bowel                        preparation was good. Findings:      The perianal and digital rectal examinations were normal.      A 3 mm polyp was found in the ascending colon. The polyp was sessile.       The polyp was removed with a cold biopsy forceps. Resection and       retrieval were complete.      The exam was otherwise without abnormality on direct and retroflexion       views. Impression:           - One 3 mm polyp in the ascending colon, removed with a                        cold biopsy forceps. Resected and retrieved.                       - The examination was otherwise normal on direct and                        retroflexion views. Recommendation:       - Discharge patient to home (with escort).                       - Resume previous diet.                       - Resume Plavix (clopidogrel) at prior dose today.                       - Await pathology results.                       - Repeat colonoscopy in 5-10 years for surveillance                        based on pathology results. Procedure Code(s):    --- Professional ---                       [id number], Colonoscopy, flexible; with biopsy, single or                        multiple Diagnosis Code(s):    --- Professional ---  D12.2, Benign neoplasm of ascending colon                       Z12.11, Encounter for screening for malignant neoplasm                        of colon CPT copyright 2016 American Medical Association. All rights reserved. The codes documented in this report are preliminary and upon coder review may  be revised to meet current compliance requirements. Jonathon Bellows, MD Jonathon Bellows MD, MD 04/22/2017 11:18:04 AM This report has been signed electronically. Number of Addenda: 0 Note Initiated On: 04/22/2017 10:40 AM Scope  Withdrawal Time: 0 hours 9 minutes 46 seconds  Total Procedure Duration: 0 hours 13 minutes 25 seconds       [facility]

## 2017-04-22 NOTE — Anesthesia Preprocedure Evaluation (Signed)
Anesthesia Evaluation  Patient identified by MRN, date of birth, ID band Patient awake    Reviewed: Allergy & Precautions, H&P , NPO status , Patient's Chart, lab work & pertinent test results, reviewed documented beta blocker date and time   History of Anesthesia Complications Negative for: history of anesthetic complications  Airway Mallampati: I  TM Distance: >3 FB Neck ROM: full    Dental  (+) Caps, Dental Advidsory Given, Missing, Teeth Intact   Pulmonary neg shortness of breath, neg sleep apnea, neg COPD, Recent URI , Residual Cough, former smoker,           Cardiovascular Exercise Tolerance: Good hypertension, (-) angina+ CAD, + Past MI and + Cardiac Stents  (-) CABG (-) dysrhythmias (-) Valvular Problems/Murmurs     Neuro/Psych negative neurological ROS  negative psych ROS   GI/Hepatic Neg liver ROS, GERD  ,  Endo/Other  negative endocrine ROS  Renal/GU negative Renal ROS  negative genitourinary   Musculoskeletal   Abdominal   Peds  Hematology negative hematology ROS (+)   Anesthesia Other Findings Past Medical History: No date: Anxiety No date: Apical mural thrombus     Comment:  a. s/p 3 months of anticoagulation No date: BPH (benign prostatic hyperplasia) No date: Coronary artery disease     Comment:  a. LHC 2/18: thrombotic occlusion of p-mLAD involving               D1/D2, mild to mod nonobs CAD of LCx & RCA, successful               IVUS-guided PCI to p-mLAD (Integrity Resolute 3.0 x 38 mm              DES) w/ 0% residual stenosis & TIMI-3 flow, D2 jailed by               LAD stent resulting in 70% ostial stenosis w/ TIMI-3               flow. b. MV 7/18: no sig isch, mod in size fixed mid &               apical anterosep * apical defect, EF 45%, low to mod risk No date: Hyperlipidemia No date: Hypertension No date: Ischemic cardiomyopathy     Comment:  a. TTE 2/18:  EF of 30-35% with LAD  territory AK, likely              mural thrombus at the LV apex; b. TTE 5/18: EF 35-40%,               mid and apical anterior/anteroseptal AK, no evidence of               LV thrombus, Gr1DD, mild biatrial enlargement, normal               RVSF No date: Myocardial infarction ([place])   Reproductive/Obstetrics negative OB ROS                             Anesthesia Physical Anesthesia Plan  ASA: II  Anesthesia Plan: General   Post-op Pain Management:    Induction: Intravenous  PONV Risk Score and Plan: 2 and Propofol infusion  Airway Management Planned: Natural Airway and Nasal Cannula  Additional Equipment:   Intra-op Plan:   Post-operative Plan:   Informed Consent: I have reviewed the patients History and Physical, chart, labs and discussed the procedure  including the risks, benefits and alternatives for the proposed anesthesia with the patient or authorized representative who has indicated his/her understanding and acceptance.   Dental Advisory Given  Plan Discussed with: Anesthesiologist, CRNA and Surgeon  Anesthesia Plan Comments:         Anesthesia Quick Evaluation

## 2017-04-22 NOTE — H&P (Signed)
Jonathon Bellows, MD [address] [place], [place], [place], [address] [address], [place], [place], [place], [address] Phone: [phone]  Fax: [phone]  Primary Care Physician:  Margo Common, [place]   Pre-Procedure History & Physical: HPI:  Cardale Dorer. is a 67 y.o. male is here for an colonoscopy.   Past Medical History:  Diagnosis Date  . Anxiety   . Apical mural thrombus    a. s/p 3 months of anticoagulation  . BPH (benign prostatic hyperplasia)   . Coronary artery disease    a. LHC 2/18: thrombotic occlusion of p-mLAD involving D1/D2, mild to mod nonobs CAD of LCx & RCA, successful IVUS-guided PCI to p-mLAD (Integrity Resolute 3.0 x 38 mm DES) w/ 0% residual stenosis & TIMI-3 flow, D2 jailed by LAD stent resulting in 70% ostial stenosis w/ TIMI-3 flow. b. MV 7/18: no sig isch, mod in size fixed mid & apical anterosep * apical defect, EF 45%, low to mod risk  . Hyperlipidemia   . Hypertension   . Ischemic cardiomyopathy    a. TTE 2/18:  EF of 30-35% with LAD territory AK, likely mural thrombus at the LV apex; b. TTE 5/18: EF 35-40%, mid and apical anterior/anteroseptal AK, no evidence of LV thrombus, Gr1DD, mild biatrial enlargement, normal RVSF  . Myocardial infarction [facility])     Past Surgical History:  Procedure Laterality Date  . CORONARY ANGIOGRAPHY N/A 03/09/2016   Procedure: Coronary Angiography;  Surgeon: Nelva Bush, MD;  Location: [place] [facility];  Service: Cardiovascular;  Laterality: N/A;  . CORONARY STENT INTERVENTION N/A 03/09/2016   Procedure: Coronary Stent Intervention;  Surgeon: Nelva Bush, MD;  Location: [place] [facility];  Service: Cardiovascular;  Laterality: N/A;  . HERNIA REPAIR  2005  . HYDROCELE EXCISION      Prior to Admission medications   Medication Sig Start Date End Date Taking? Authorizing Provider  aspirin EC 81 MG tablet Take 1 tablet (81 mg total) by mouth daily. 06/16/16  Yes End, Harrell Gave, MD  atorvastatin  (LIPITOR) 80 MG tablet Take 1 tablet (80 mg total) by mouth daily. 01/14/17 04/22/17 Yes End, Harrell Gave, MD  carvedilol (COREG) 3.125 MG tablet Take 1 tablet (3.125 mg total) by mouth 2 (two) times daily. 01/14/17 04/22/17 Yes End, Harrell Gave, MD  famotidine (PEPCID) 20 MG tablet Take 20 mg by mouth 2 (two) times daily.   Yes [provider]  losartan (COZAAR) 25 MG tablet Take 1 tablet (25 mg total) by mouth daily. 01/04/17 04/22/17 Yes End, Harrell Gave, MD  spironolactone (ALDACTONE) 25 MG tablet Take 0.5 tablets (12.5 mg total) by mouth daily. 01/04/17 04/22/17 Yes End, Harrell Gave, MD  clopidogrel (PLAVIX) 75 MG tablet Take 1 tablet (75 mg total) by mouth daily. 01/14/17   End, Harrell Gave, MD  nitroGLYCERIN (NITROSTAT) 0.4 MG SL tablet Place 1 tablet (0.4 mg total) under the tongue every 5 (five) minutes as needed for chest pain. Maximum of 3 doses. 07/01/16 04/18/17  Nelva Bush, MD    Allergies as of 03/31/2017  . (No Known Allergies)    Family History  Problem Relation Age of Onset  . Emphysema Mother   . Macular degeneration Mother   . Heart attack Mother   . Heart attack Father     Social History   Socioeconomic History  . Marital status: Married    Spouse name: Not on file  . Number of children: Not on file  . Years of education: Not on  file  . Highest education level: Not on file  Occupational History  . Not on file  Social Needs  . Financial resource strain: Not on file  . Food insecurity:    Worry: Not on file    Inability: Not on file  . Transportation needs:    Medical: Not on file    Non-medical: Not on file  Tobacco Use  . Smoking status: Former Smoker    Start date: 07/03/2007  . Smokeless tobacco: Never Used  . Tobacco comment: QUIT IN 2005  Substance and Sexual Activity  . Alcohol use: Yes    Alcohol/week: 1.8 oz    Types: 3 Cans of beer per week  . Drug use: Yes    Types: Marijuana    Comment: smokes pot  . Sexual activity: Not on file    Lifestyle  . Physical activity:    Days per week: Not on file    Minutes per session: Not on file  . Stress: Not on file  Relationships  . Social connections:    Talks on phone: Not on file    Gets together: Not on file    Attends religious service: Not on file    Active member of club or organization: Not on file    Attends meetings of clubs or organizations: Not on file    Relationship status: Not on file  . Intimate partner violence:    Fear of current or ex partner: Not on file    Emotionally abused: Not on file    Physically abused: Not on file    Forced sexual activity: Not on file  Other Topics Concern  . Not on file  Social History Narrative   Living with family, Independent at baseline    Review of Systems: See HPI, otherwise negative ROS  Physical Exam: BP 117/71   Pulse (!) 55   Temp (!) 97.2 F (36.2 C) (Tympanic)   Resp 16   Ht 5\' 10"  (1.778 m)   Wt 198 lb 8 oz (90 kg)   BMI 28.48 kg/m  General:   Alert,  pleasant and cooperative in NAD Head:  Normocephalic and atraumatic. Neck:  Supple; no masses or thyromegaly. Lungs:  Clear throughout to auscultation, normal respiratory effort.    Heart:  +S1, +S2, Regular rate and rhythm, No edema. Abdomen:  Soft, nontender and nondistended. Normal bowel sounds, without guarding, and without rebound.   Neurologic:  Alert and  oriented x4;  grossly normal neurologically.  Impression/Plan: [name] is here for an colonoscopy to be performed for Screening colonoscopy average risk   Risks, benefits, limitations, and alternatives regarding  colonoscopy have been reviewed with the patient.  Questions have been answered.  All parties agreeable.   Jonathon Bellows, MD  04/22/2017, 10:39 AM

## 2017-04-22 NOTE — Anesthesia Postprocedure Evaluation (Signed)
Anesthesia Post Note  Patient: Edward [name].  Procedure(s) Performed: COLONOSCOPY WITH PROPOFOL (N/A )  Patient location during evaluation: Endoscopy Anesthesia Type: General Level of consciousness: awake and alert Pain management: pain level controlled Vital Signs Assessment: post-procedure vital signs reviewed and stable Respiratory status: spontaneous breathing, nonlabored ventilation, respiratory function stable and patient connected to nasal cannula oxygen Cardiovascular status: blood pressure returned to baseline and stable Postop Assessment: no apparent nausea or vomiting Anesthetic complications: no     Last Vitals:  Vitals Value Taken Time  BP    Temp    Pulse    Resp    SpO2      Last Pain:  Vitals:   04/22/17 1120  TempSrc: Tympanic  PainSc:                  Martha Clan

## 2017-04-22 NOTE — Transfer of Care (Signed)
Immediate Anesthesia Transfer of Care Note  Patient: Edward [name].  Procedure(s) Performed: COLONOSCOPY WITH PROPOFOL (N/A )  Patient Location: PACU  Anesthesia Type:General  Level of Consciousness: awake, alert  and oriented  Airway & Oxygen Therapy: Patient Spontanous Breathing and Patient connected to nasal cannula oxygen  Post-op Assessment: Report given to RN and Post -op Vital signs reviewed and stable  Post vital signs: Reviewed and stable  Last Vitals:  Vitals Value Taken Time  BP 128/62 04/22/2017 11:20 AM  Temp 36.2 C 04/22/2017 11:20 AM  Pulse 58 04/22/2017 11:23 AM  Resp 15 04/22/2017 11:23 AM  SpO2 100 % 04/22/2017 11:23 AM  Vitals shown include unvalidated device data.  Last Pain:  Vitals:   04/22/17 1120  TempSrc: Tympanic  PainSc:          Complications: No apparent anesthesia complications

## 2017-04-22 NOTE — Anesthesia Post-op Follow-up Note (Signed)
Anesthesia QCDR form completed.        

## 2017-04-25 ENCOUNTER — Encounter: Payer: Self-pay | Admitting: Gastroenterology

## 2017-04-25 LAB — SURGICAL PATHOLOGY

## 2017-05-01 ENCOUNTER — Encounter: Payer: Self-pay | Admitting: Gastroenterology

## 2017-07-14 ENCOUNTER — Telehealth: Payer: Self-pay | Admitting: Internal Medicine

## 2017-07-14 ENCOUNTER — Other Ambulatory Visit: Payer: Self-pay

## 2017-07-14 MED ORDER — CLOPIDOGREL BISULFATE 75 MG PO TABS: 75.0000 mg | ORAL_TABLET | Freq: Every day | ORAL | 2 refills | Status: DC

## 2017-07-14 MED ORDER — SPIRONOLACTONE 25 MG PO TABS: 12.5000 mg | ORAL_TABLET | Freq: Every day | ORAL | 0 refills | Status: DC

## 2017-07-14 MED ORDER — CARVEDILOL 3.125 MG PO TABS: 3.1250 mg | ORAL_TABLET | Freq: Two times a day (BID) | ORAL | 0 refills | Status: DC

## 2017-07-14 MED ORDER — LOSARTAN POTASSIUM 25 MG PO TABS: 25.0000 mg | ORAL_TABLET | Freq: Every day | ORAL | 2 refills | Status: DC

## 2017-07-14 MED ORDER — ATORVASTATIN CALCIUM 80 MG PO TABS: 80.0000 mg | ORAL_TABLET | Freq: Every day | ORAL | 0 refills | Status: DC

## 2017-07-14 NOTE — Telephone Encounter (Signed)
Requested Prescriptions   Signed Prescriptions Disp Refills  . atorvastatin (LIPITOR) 80 MG tablet 90 tablet 0    Sig: Take 1 tablet (80 mg total) by mouth daily.    Authorizing Provider: END, CHRISTOPHER    Ordering User: Janan Ridge carvedilol (COREG) 3.125 MG tablet 180 tablet 0    Sig: Take 1 tablet (3.125 mg total) by mouth 2 (two) times daily.    Authorizing Provider: END, CHRISTOPHER    Ordering User: Janan Ridge clopidogrel (PLAVIX) 75 MG tablet 90 tablet 2    Sig: Take 1 tablet (75 mg total) by mouth daily.    Authorizing Provider: END, CHRISTOPHER    Ordering User: Janan Ridge losartan (COZAAR) 25 MG tablet 180 tablet 2    Sig: Take 1 tablet (25 mg total) by mouth daily.    Authorizing Provider: END, CHRISTOPHER    Ordering User: Janan Ridge spironolactone (ALDACTONE) 25 MG tablet 45 tablet 0    Sig: Take 0.5 tablets (12.5 mg total) by mouth daily.    Authorizing Provider: END, CHRISTOPHER    Ordering User: Janan Ridge

## 2017-07-14 NOTE — Telephone Encounter (Signed)
°*  STAT* If patient is at the pharmacy, call can be transferred to refill team.   1. Which medications need to be refilled? (please list name of each medication and dose if known)  Spironolactone 25 MG 0.5 tablets daily  Losartan 25 MG 1 tablet daily Clopidogrel 75 MG 1 tablet daily Carvedilol 3.125 MG 1 tablet daily Atorvastatin 80 MG 1 tablet daily   2. Which pharmacy/location (including street and city if local pharmacy) is medication to be sent to? [facility]  3. Do they need a 30 day or 90 day supply? 90 day

## 2017-09-02 ENCOUNTER — Telehealth: Payer: Self-pay | Admitting: Internal Medicine

## 2017-09-02 NOTE — Telephone Encounter (Signed)
[facility] calling  Needs to clarify directions of losartan 25 MG Please call [phone] to discuss

## 2017-09-02 NOTE — Telephone Encounter (Signed)
I left a message on a secure voice mail for the [facility] rep that the patient is taking losartan 25 mg once daily.  I stated we last saw him in March 2019 and have made no changes since then. Last RX refill was sent in in June 2019.  I asked that they call back with any further questions.

## 2017-09-05 ENCOUNTER — Ambulatory Visit (INDEPENDENT_AMBULATORY_CARE_PROVIDER_SITE_OTHER): Payer: Medicare Other | Admitting: Family Medicine

## 2017-09-05 VITALS — BP 104/70 | HR 56 | Temp 98.3°F | Resp 16 | Wt 212.0 lb

## 2017-09-05 DIAGNOSIS — M7742 Metatarsalgia, left foot: Secondary | ICD-10-CM

## 2017-09-05 DIAGNOSIS — N489 Disorder of penis, unspecified: Secondary | ICD-10-CM | POA: Diagnosis not present

## 2017-09-05 DIAGNOSIS — D225 Melanocytic nevi of trunk: Secondary | ICD-10-CM

## 2017-09-05 DIAGNOSIS — L603 Nail dystrophy: Secondary | ICD-10-CM | POA: Diagnosis not present

## 2017-09-05 DIAGNOSIS — I255 Ischemic cardiomyopathy: Secondary | ICD-10-CM

## 2017-09-05 NOTE — Patient Instructions (Signed)
Try a gel insert and Tylenol for your left foot. Monitor progress of nail discoloration. Monitor penis for recurrence of any lesion. We will recheck you in 3-4 weeks. Try Vick's vaporub or [place] Tea Tree Oil applied to your nails daily for up to 6 months.

## 2017-09-05 NOTE — Progress Notes (Signed)
  Subjective:     Patient ID: Edward Heir., male   DOB: Sep 08, 1950, 67 y.o.   MRN: [id number] Chief Complaint  Patient presents with  . Nail Problem    4 & 5 toes left foot for 1 month  . Skin Problem    lesion to be checked, lump on back, penile lesion   HPI States he has been walking a lot for exercise and has developed left foot pain. He has dystrophic nails on his left fourth and fifth toes and has noticed a brown discoloration at the base of the 4th toenail. He has an old mole on his upper back and wishes me to check on it. Last he developed a raised scaly area on penis which he states he peeled off leaving a rough area. Denis itching or pain . Reports he is married and monogamous. States his wife has had cold sores during a recent oral sex episode.  Review of Systems     Objective:   Physical Exam  Constitutional: He appears well-developed and well-nourished. No distress.  Musculoskeletal:  Left plantar foot tender @ fifth metatarsal. 4th and fifth toenails dystrophic with small areas of brown discoloration at the base of his 4th nail. Appears to be in the nail itself not the nailbed.  Skin:  0.5 cm well circumscribed flesh colored nevus on his upper back. Glans penis with depressed slightly hypopigmented patch. No scaling or vesicles.       Assessment:    1. Metatarsalgia of left foot: gel inserts and Tylenol  2. Nevus of back: benign appearance  3. Dystrophic nail: discussed topical otc preparations and recheck in 3-4  Weeks.  4. Lesion of penis: monitor and recheck in 3-4 weeks.    Plan:    Further f/u pending in 3 weeks.

## 2017-09-14 DIAGNOSIS — R0789 Other chest pain: Secondary | ICD-10-CM | POA: Diagnosis not present

## 2017-09-15 ENCOUNTER — Emergency Department
Admission: EM | Admit: 2017-09-15 | Discharge: 2017-09-15 | Disposition: A | Discharge status: Home / Self Care | Payer: Medicare Other | Attending: Emergency Medicine | Admitting: Emergency Medicine

## 2017-09-15 ENCOUNTER — Other Ambulatory Visit: Payer: Self-pay

## 2017-09-15 ENCOUNTER — Emergency Department: Payer: Medicare Other

## 2017-09-15 ENCOUNTER — Encounter: Payer: Self-pay | Admitting: Emergency Medicine

## 2017-09-15 ENCOUNTER — Telehealth: Payer: Self-pay | Admitting: Internal Medicine

## 2017-09-15 DIAGNOSIS — Z87891 Personal history of nicotine dependence: Secondary | ICD-10-CM | POA: Diagnosis not present

## 2017-09-15 DIAGNOSIS — Z7982 Long term (current) use of aspirin: Secondary | ICD-10-CM | POA: Diagnosis not present

## 2017-09-15 DIAGNOSIS — Z955 Presence of coronary angioplasty implant and graft: Secondary | ICD-10-CM | POA: Insufficient documentation

## 2017-09-15 DIAGNOSIS — I2510 Atherosclerotic heart disease of native coronary artery without angina pectoris: Secondary | ICD-10-CM | POA: Insufficient documentation

## 2017-09-15 DIAGNOSIS — Z7902 Long term (current) use of antithrombotics/antiplatelets: Secondary | ICD-10-CM | POA: Insufficient documentation

## 2017-09-15 DIAGNOSIS — R079 Chest pain, unspecified: Secondary | ICD-10-CM

## 2017-09-15 DIAGNOSIS — R0789 Other chest pain: Secondary | ICD-10-CM | POA: Insufficient documentation

## 2017-09-15 DIAGNOSIS — R42 Dizziness and giddiness: Secondary | ICD-10-CM | POA: Insufficient documentation

## 2017-09-15 DIAGNOSIS — I10 Essential (primary) hypertension: Secondary | ICD-10-CM | POA: Diagnosis not present

## 2017-09-15 LAB — CBC
HEMATOCRIT: 42.8 % (ref 40.0–52.0)
Hemoglobin: 14.7 g/dL (ref 13.0–18.0)
MCH: 33.6 pg (ref 26.0–34.0)
MCHC: 34.3 g/dL (ref 32.0–36.0)
MCV: 98 fL (ref 80.0–100.0)
Platelets: 195 10*3/uL (ref 150–440)
RBC: 4.37 MIL/uL — ABNORMAL LOW (ref 4.40–5.90)
RDW: 12.9 % (ref 11.5–14.5)
WBC: 7.3 10*3/uL (ref 3.8–10.6)

## 2017-09-15 LAB — BASIC METABOLIC PANEL
Anion gap: 7 (ref 5–15)
BUN: 18 mg/dL (ref 8–23)
CHLORIDE: 105 mmol/L (ref 98–111)
CO2: 28 mmol/L (ref 22–32)
Calcium: 9.2 mg/dL (ref 8.9–10.3)
Creatinine, Ser: 0.75 mg/dL (ref 0.61–1.24)
GFR calc Af Amer: >60 mL/min (ref >60)
Glucose, Bld: 102 mg/dL — ABNORMAL HIGH (ref 70–99)
POTASSIUM: 4.1 mmol/L (ref 3.5–5.1)
Sodium: 140 mmol/L (ref 135–145)

## 2017-09-15 LAB — TROPONIN I: Troponin I: <0.03 ng/mL (ref <0.03)

## 2017-09-15 MED ORDER — SUCRALFATE 1 G PO TABS: 1.0000 g | ORAL_TABLET | Freq: Four times a day (QID) | ORAL | 0 refills | Status: DC

## 2017-09-15 NOTE — Telephone Encounter (Signed)
Called patient. He went to ED last night after having chest pain, lightheadedness and tingling in his forearms for about a week.  He was discharged home this morning and told to f/u with cardiology. Scheduled patient to see Christell Faith, PA-C on 09/19/17. He was appreciative and will call back with any other questions or concerns.

## 2017-09-15 NOTE — ED Provider Notes (Signed)
[facility] Emergency Department Provider Note  ____________________________________________   I have reviewed the triage vital signs and the nursing notes.   HISTORY  Chief Complaint Chest Pain   History limited by: Not Limited   HPI Edward [name]. is a 67 y.o. male who presents to the emergency department today because of concern for chest pain. Located in the left lower chest. Has been present for the past week to week and a half. He has a hard time describing it but denies that it is sharp. It is worse with exertion. He has noticed some lightheadedness with the pain. He denies any shortness of breath. No diaphoresis or flushed feelings.    Per medical record review patient has a history of CAD  Past Medical History:  Diagnosis Date  . Anxiety   . Apical mural thrombus    a. s/p 3 months of anticoagulation  . BPH (benign prostatic hyperplasia)   . Coronary artery disease    a. LHC 2/18: thrombotic occlusion of p-mLAD involving D1/D2, mild to mod nonobs CAD of LCx & RCA, successful IVUS-guided PCI to p-mLAD (Integrity Resolute 3.0 x 38 mm DES) w/ 0% residual stenosis & TIMI-3 flow, D2 jailed by LAD stent resulting in 70% ostial stenosis w/ TIMI-3 flow. b. MV 7/18: no sig isch, mod in size fixed mid & apical anterosep * apical defect, EF 45%, low to mod risk  . Hyperlipidemia   . Hypertension   . Ischemic cardiomyopathy    a. TTE 2/18:  EF of 30-35% with LAD territory AK, likely mural thrombus at the LV apex; b. TTE 5/18: EF 35-40%, mid and apical anterior/anteroseptal AK, no evidence of LV thrombus, Gr1DD, mild biatrial enlargement, normal RVSF  . Myocardial infarction [facility])     Patient Active Problem List   Diagnosis Date Noted  . Atypical chest pain 08/27/2016  . Chronic systolic heart failure ([place]) 04/07/2016  . Coronary artery disease involving native coronary artery of native heart with angina pectoris ([place]) 03/25/2016  . Apical mural thrombus  following MI ([place]) 03/22/2016  . Encounter for therapeutic drug monitoring 03/22/2016  . Clostridium difficile diarrhea   . Ischemic cardiomyopathy   . Smoker   . Hyperlipidemia LDL goal <70   . Encounter for anticoagulation discussion and counseling   . Anxiety 07/30/2014  . Arthritis of neck 07/30/2014  . ED (erectile dysfunction) of organic origin 07/30/2014  . Personal history of tobacco use, presenting hazards to health 07/30/2014  . Adiposity 07/30/2014  . Delayed onset of urination 07/30/2014  . Brachial neuritis 08/26/2009  . Actinic keratoses 07/30/2008  . Essential (primary) hypertension 10/29/2005    Past Surgical History:  Procedure Laterality Date  . COLONOSCOPY WITH PROPOFOL N/A 04/22/2017   Procedure: COLONOSCOPY WITH PROPOFOL;  Surgeon: Jonathon Bellows, MD;  Location: [facility] ENDOSCOPY;  Service: Gastroenterology;  Laterality: N/A;  . CORONARY ANGIOGRAPHY N/A 03/09/2016   Procedure: Coronary Angiography;  Surgeon: Nelva Bush, MD;  Location: [place] [facility];  Service: Cardiovascular;  Laterality: N/A;  . CORONARY STENT INTERVENTION N/A 03/09/2016   Procedure: Coronary Stent Intervention;  Surgeon: Nelva Bush, MD;  Location: [place] [facility];  Service: Cardiovascular;  Laterality: N/A;  . HERNIA REPAIR  2005  . HYDROCELE EXCISION      Prior to Admission medications   Medication Sig Start Date End Date Taking? Authorizing Provider  aspirin EC 81 MG tablet Take 1 tablet (81 mg total) by mouth daily. 06/16/16   End, Harrell Gave,  MD  atorvastatin (LIPITOR) 80 MG tablet Take 1 tablet (80 mg total) by mouth daily. 07/14/17 10/12/17  End, Harrell Gave, MD  carvedilol (COREG) 3.125 MG tablet Take 1 tablet (3.125 mg total) by mouth 2 (two) times daily. 07/14/17 10/12/17  End, Harrell Gave, MD  clopidogrel (PLAVIX) 75 MG tablet Take 1 tablet (75 mg total) by mouth daily. 07/14/17   End, Harrell Gave, MD  famotidine (PEPCID) 20 MG tablet Take 20 mg by mouth 2 (two) times daily.     [provider]  losartan (COZAAR) 25 MG tablet Take 1 tablet (25 mg total) by mouth daily. 07/14/17 10/12/17  End, Harrell Gave, MD  nitroGLYCERIN (NITROSTAT) 0.4 MG SL tablet Place 1 tablet (0.4 mg total) under the tongue every 5 (five) minutes as needed for chest pain. Maximum of 3 doses. 07/01/16 04/18/17  End, Harrell Gave, MD  spironolactone (ALDACTONE) 25 MG tablet Take 0.5 tablets (12.5 mg total) by mouth daily. 07/14/17 10/12/17  End, Harrell Gave, MD    Allergies Patient has no known allergies.  Family History  Problem Relation Age of Onset  . Emphysema Mother   . Macular degeneration Mother   . Heart attack Mother   . Heart attack Father     Social History Social History   Tobacco Use  . Smoking status: Former Smoker    Start date: 07/03/2007  . Smokeless tobacco: Never Used  . Tobacco comment: QUIT IN 2005  Substance Use Topics  . Alcohol use: Yes    Alcohol/week: 3.0 standard drinks    Types: 3 Cans of beer per week  . Drug use: Yes    Types: Marijuana    Comment: smokes pot    Review of Systems Constitutional: No fever/chills Eyes: No visual changes. ENT: No sore throat. Cardiovascular: Positive for chest pain. Respiratory: Denies shortness of breath. Gastrointestinal: No abdominal pain.  No nausea, no vomiting.  No diarrhea.   Genitourinary: Negative for dysuria. Musculoskeletal: Negative for back pain. Skin: Negative for rash. Neurological: Positive for lightheadedness.  ____________________________________________   PHYSICAL EXAM:  VITAL SIGNS: ED Triage Vitals  Enc Vitals Group     BP 09/15/17 0023 (!) 170/77     Pulse Rate 09/15/17 0023 (!) 55     Resp 09/15/17 0023 16     Temp 09/15/17 0023 97.6 F (36.4 C)     Temp Source 09/15/17 0023 Oral     SpO2 09/15/17 0023 99 %     Weight 09/15/17 0018 200 lb (90.7 kg)     Height 09/15/17 0018 5\' 11"  (1.803 m)     Head Circumference --      Peak Flow --      Pain Score 09/15/17 0017 4    Constitutional: Alert and oriented.  Eyes: Conjunctivae are normal.  ENT      Head: Normocephalic and atraumatic.      Nose: No congestion/rhinnorhea.      Mouth/Throat: Mucous membranes are moist.      Neck: No stridor. Hematological/Lymphatic/Immunilogical: No cervical lymphadenopathy. Cardiovascular: Normal rate, regular rhythm.  No murmurs, rubs, or gallops.  Respiratory: Normal respiratory effort without tachypnea nor retractions. Breath sounds are clear and equal bilaterally. No wheezes/rales/rhonchi. Gastrointestinal: Soft and non tender. No rebound. No guarding.  Genitourinary: Deferred Musculoskeletal: Normal range of motion in all extremities. No lower extremity edema. Neurologic:  Normal speech and language. No gross focal neurologic deficits are appreciated.  Skin:  Skin is warm, dry and intact. No rash noted. Psychiatric: Mood and affect are normal. Speech  and behavior are normal. Patient exhibits appropriate insight and judgment.  ____________________________________________    LABS (pertinent positives/negatives)  Trop <0.03 CBC wbc 7.3, hgb 14.7, plt 195 BMP wnl except glu 102  ____________________________________________   EKG  I, Nance Pear, attending physician, personally viewed and interpreted this EKG  EKG Time: 0014 Rate: 55 Rhythm: sinus bradycardia with 1st degree av block Axis: left axis deviation Intervals: qtc 419 QRS: incomplete RBBB ST changes: no st elevation Impression: abnormal ekg   ____________________________________________    RADIOLOGY  CXR No active disease  ____________________________________________   PROCEDURES  Procedures  ____________________________________________   INITIAL IMPRESSION / ASSESSMENT AND PLAN / ED COURSE  Pertinent labs & imaging results that were available during my care of the patient were reviewed by me and considered in my medical decision making (see chart for details).   Patient  presented to the emergency department today because of concerns for chest pain.  Has been present past week week and a half.  Troponin was negative.  At this point I doubt ACS.  Patient does state he has been under a lot of stress.  Do wonder if there is exacerbation potential because of the.  However did have a discussion with patient about importance of cardiology follow-up.  X-ray was negative.  At this point doubt dissection doubt PE.  ____________________________________________   FINAL CLINICAL IMPRESSION(S) / ED DIAGNOSES  Final diagnoses:  Nonspecific chest pain     Note: This dictation was prepared with Dragon dictation. Any transcriptional errors that result from this process are unintentional     Nance Pear, MD 09/15/17 [phone]

## 2017-09-15 NOTE — Discharge Instructions (Signed)
Please seek medical attention for any high fevers, chest pain, shortness of breath, change in behavior, persistent vomiting, bloody stool or any other new or concerning symptoms.  

## 2017-09-15 NOTE — Telephone Encounter (Signed)
Pt wanted to let us know for about a week he has been having episodes of light headedness and tingling in his forearms. States he went to the ED last night due to chest pain, states he has had this pain for the last week.   Pt c/o of Chest Pain: STAT if CP now or developed within 24 hours  1. Are you having CP right now? no  2. Are you experiencing any other symptoms (ex. SOB, nausea, vomiting, sweating)? no  3. How long have you been experiencing CP? About a week  4. Is your CP continuous or coming and going? Comes and goes  5. Have you taken Nitroglycerin?  ?

## 2017-09-15 NOTE — ED Triage Notes (Addendum)
Pt presents to ED with c/o burning left sided chest pain for over a week. Hx of MI in 2018. Pt denies sob or nausea. Pt alert and talkative during triage. No distress noted.

## 2017-09-15 NOTE — ED Notes (Signed)
Family updated on wait time. Labs reviewed.

## 2017-09-16 NOTE — Progress Notes (Signed)
Cardiology Office Note Date:  09/19/2017  Patient ID:  Edward Garfield., DOB Jan 12, 1951, MRN [id number] PCP:  Margo Common, PA  Cardiologist:  Dr. Saunders Revel, MD    Chief Complaint: ED follow up  History of Present Illness: Edward Brunty. is a 67 y.o. male with history of CAD s/p late-presenting anterior STEMI in 04/2200, chronic systolic CHF secondary to ICM complicated by apical thrombus resolved following anticoagulation x 3 months, HTN, and HLD who presents for ED follow up of chest pain.  He was admitted in 03/2016 with a late-presenting anterior wall STEMI. Cardiac cath on 03/09/2016 showed thrombotic occlusion of the proximal/mid LAD involving D1 and D2, mild to moderate nonobstructive CAD involving the LCx and RCA. He underwent successful IVUS-guided PCI to proximal and mid LAD using Integrity Resolute 3.0 x 38 mm DES with 0% residual stenosis and TIMI-3 flow. D2 was jailed by the LAD stent resulting in 70% ostial stenosis with TIMI-3 flow. TTE 03/10/16 showed an EF of 30-35% with LAD territory AK, likely mural thrombus at the LV apex. Repeat echo in 06/2016 showed EF 35-40%, mid and apical anterior/anteroseptal AK, no evidence of LV thrombus, Gr1DD, mild biatrial enlargement, normal RVSF. Myoview in 08/2016 showed no significant ischemia, fixed defect in the mid and distal LAD distribution, LVEF 45%, low to intermediate risk study. He was seen in the ED on 03/13/17 with atypical chest pain. EKG was stable and troponin was negative x 2. He was seen in the office for follow up on 03/23/17 and reported occasional episodes of chest and upper abdominal pain that he atributed to heartburn and gas. He was noted to have been riding a stationary bike for 15 miles per day without symptoms. Weight of 208 pounds. He was started on Pepcid for possible empiric treatment of GERD with improvement in symptoms when he was last seen in the office on 04/18/2017.  He was seen in the [facility] ED on 09/15/2017 with chest pain.   Troponin negative x1, WBC 7.3, hemoglobin 14.7, serum creatinine 0.75, potassium 4.1.  Chest x-ray without active cardiopulmonary disease.  EKG showed sinus bradycardia, 55 bpm, first-degree AV block, left axis deviation, left anterior fascicular block, incomplete right bundle branch block.  He was discharged to outpatient follow-up.  He comes in today with several complaints. He has noted for the past 2 weeks a fairly constant chest pressure with associated bilateral arm heaviness/tingling that is similar to how he felt in 03/2016 when he had his late-presenting MI as detailed above. Pain does not worsen with exertion, though he has not truly been exerting himself lately. He has been complaint with his medications. He recently purchased a BP cuff on 09/18/2017 with home readings ranging from 542 systolic to 706 systolic on 2/37/62. BP cuff was brought to his appointment today and is comparable to our readings: office BP cuff 148/94; patient BP cuff 151/103. Lastly, he notes over this same time period a fullness/pressure in his head with possible dizziness. He is not certain if this symptom was present when he had his MI in 03/2016. He denies any presyncope or syncope. No palpitations or vision changes. He has not yet spoken with his PCP regarding his head fullness. He was started on Carafate by the ED MD on 09/15/17 for possible gastritis playing a role in the above. He notes he continues to have the chest pressure.   Past Medical History:  Diagnosis Date  . Anxiety   . Apical mural thrombus  a. s/p 3 months of anticoagulation  . BPH (benign prostatic hyperplasia)   . Coronary artery disease    a. LHC 2/18: thrombotic occlusion of p-mLAD involving D1/D2, mild to mod nonobs CAD of LCx & RCA, successful IVUS-guided PCI to p-mLAD (Integrity Resolute 3.0 x 38 mm DES) w/ 0% residual stenosis & TIMI-3 flow, D2 jailed by LAD stent resulting in 70% ostial stenosis w/ TIMI-3 flow. b. MV 7/18: no sig isch, mod in  size fixed mid & apical anterosep * apical defect, EF 45%, low to mod risk  . Hyperlipidemia   . Hypertension   . Ischemic cardiomyopathy    a. TTE 2/18:  EF of 30-35% with LAD territory AK, likely mural thrombus at the LV apex; b. TTE 5/18: EF 35-40%, mid and apical anterior/anteroseptal AK, no evidence of LV thrombus, Gr1DD, mild biatrial enlargement, normal RVSF  . Myocardial infarction [facility])     Past Surgical History:  Procedure Laterality Date  . COLONOSCOPY WITH PROPOFOL N/A 04/22/2017   Procedure: COLONOSCOPY WITH PROPOFOL;  Surgeon: Jonathon Bellows, MD;  Location: [facility] ENDOSCOPY;  Service: Gastroenterology;  Laterality: N/A;  . CORONARY ANGIOGRAPHY N/A 03/09/2016   Procedure: Coronary Angiography;  Surgeon: Nelva Bush, MD;  Location: [place] CV LAB;  Service: Cardiovascular;  Laterality: N/A;  . CORONARY STENT INTERVENTION N/A 03/09/2016   Procedure: Coronary Stent Intervention;  Surgeon: Nelva Bush, MD;  Location: [place] CV LAB;  Service: Cardiovascular;  Laterality: N/A;  . HERNIA REPAIR  2005  . HYDROCELE EXCISION      Current Meds  Medication Sig  . aspirin EC 81 MG tablet Take 1 tablet (81 mg total) by mouth daily.  Marland Kitchen atorvastatin (LIPITOR) 80 MG tablet Take 1 tablet (80 mg total) by mouth daily.  . carvedilol (COREG) 3.125 MG tablet Take 1 tablet (3.125 mg total) by mouth 2 (two) times daily.  . clopidogrel (PLAVIX) 75 MG tablet Take 1 tablet (75 mg total) by mouth daily.  . famotidine (PEPCID) 20 MG tablet Take 20 mg by mouth 2 (two) times daily.  Marland Kitchen losartan (COZAAR) 25 MG tablet Take 1 tablet (25 mg total) by mouth daily.  . nitroGLYCERIN (NITROSTAT) 0.4 MG SL tablet Place 1 tablet (0.4 mg total) under the tongue every 5 (five) minutes as needed for chest pain. Maximum of 3 doses.  Marland Kitchen spironolactone (ALDACTONE) 25 MG tablet Take 0.5 tablets (12.5 mg total) by mouth daily.  . sucralfate (CARAFATE) 1 g tablet Take 1 tablet (1 g total) by mouth 4 (four) times  daily.    Allergies:   Patient has no known allergies.   Social History:  The patient  reports that he has quit smoking. He started smoking about 10 years ago. He has never used smokeless tobacco. He reports that he drinks about 3.0 standard drinks of alcohol per week. He reports that he has current or past drug history. Drug: Marijuana.   Family History:  The patient's family history includes Emphysema in his mother; Heart attack in his father and mother; Macular degeneration in his mother.  ROS:   Review of Systems  Constitutional: Positive for malaise/fatigue. Negative for chills, diaphoresis, fever and weight loss.  HENT: Negative for congestion.   Eyes: Negative for discharge and redness.  Respiratory: Negative for cough, hemoptysis, sputum production, shortness of breath and wheezing.   Cardiovascular: Positive for chest pain. Negative for palpitations, orthopnea, claudication, leg swelling and PND.  Gastrointestinal: Positive for heartburn. Negative for abdominal pain, blood in stool, melena,  nausea and vomiting.  Genitourinary: Negative for hematuria.  Musculoskeletal: Negative for falls and myalgias.  Skin: Negative for rash.  Neurological: Positive for dizziness, weakness and headaches. Negative for tingling, tremors, sensory change, speech change, focal weakness, seizures and loss of consciousness.  Endo/Heme/Allergies: Does not bruise/bleed easily.  Psychiatric/Behavioral: Negative for substance abuse. The patient is nervous/anxious.   All other systems reviewed and are negative.    PHYSICAL EXAM:  VS:  BP (!) 148/94 (BP Location: Left Arm, Patient Position: Sitting, Cuff Size: Normal)   Pulse 70   Ht 5\' 10"  (1.778 m)   Wt 202 lb 4 oz (91.7 kg)   BMI 29.02 kg/m  BMI: Body mass index is 29.02 kg/m.  Physical Exam  Constitutional: He is oriented to person, place, and time. He appears well-developed and well-nourished.  HENT:  Head: Normocephalic and atraumatic.  Eyes:  Right eye exhibits no discharge. Left eye exhibits no discharge.  Neck: Normal range of motion. No JVD present.  Cardiovascular: Normal rate, regular rhythm, S1 normal, S2 normal and normal heart sounds. Exam reveals no distant heart sounds, no friction rub, no midsystolic click and no opening snap.  No murmur heard. Pulses:      Posterior tibial pulses are 2+ on the right side, and 2+ on the left side.  Pulmonary/Chest: Effort normal and breath sounds normal. No respiratory distress. He has no decreased breath sounds. He has no wheezes. He has no rales. He exhibits no tenderness.  Abdominal: Soft. He exhibits no distension. There is no tenderness.  Musculoskeletal: He exhibits no edema.  Neurological: He is alert and oriented to person, place, and time.  Skin: Skin is warm and dry. No cyanosis. Nails show no clubbing.  Psychiatric: He has a normal mood and affect. His speech is normal and behavior is normal. Judgment and thought content normal.     EKG:  Was ordered and interpreted by me today. Shows NSR, 62 bpm, left axis deviation, incomplete RBBB, prior septal infarct, nonspecific st/t changes   Recent Labs: 03/25/2017: ALT 22; TSH 1.310 09/15/2017: BUN 18; Creatinine, Ser 0.75; Hemoglobin 14.7; Platelets 195; Potassium 4.1; Sodium 140  03/25/2017: Chol/HDL Ratio 2.3; Cholesterol, Total 102; HDL 44; LDL Calculated 48; Triglycerides 51   Estimated Creatinine Clearance: 104.8 mL/min (by C-G formula based on SCr of 0.75 mg/dL).   Wt Readings from Last 3 Encounters:  09/19/17 202 lb 4 oz (91.7 kg)  09/15/17 200 lb (90.7 kg)  09/05/17 212 lb (96.2 kg)     Other studies reviewed: Additional studies/records reviewed today include: summarized above  ASSESSMENT AND PLAN:  1. CAD involving the native coronary arteries with unstable angina: Currently, symptom-free. His symptoms are both typical and atypical. It is concerning that he feels like they are similar to his prior angina. In this  setting, we have discussed ischemic evaluation options including invasive and noninvasive. He would prefer to proceed with a LHC to definitively evaluate for ischemia playing a role in his symptoms. He has been scheduled for a LHC with Dr. Saunders Revel on 09/20/2017. Check echo. Continue ASA, Plavix, Coreg (increased dose as below), and Lipitor. Risks and benefits of cardiac catheterization have been discussed with the patient including risks of bleeding, bruising, infection, kidney damage, stroke, heart attack, and death. The patient understands these risks and is willing to proceed with the procedure. All questions have been answered and concerns listened to.   2. HFrEF secondary to ICM: He does not appear to be grossly volume up at  this time. Continue Coreg at 6.25 mg bid (increase dose today as below), losartan 25 mg daily, and spironolactone 12.5 mg daily. Check echo.  3. Prior mural thrombus: Resolved on follow up echo in 06/2016. No longer on anticoagulation.   4. HTN: Blood pressure is poorly controlled. Cannot exclude anxiety playing a role in his above symptoms and elevated BP. Increase Coreg to 6.25 mg bid. He will otherwise continue losartan 25 mg daily and spironolactone 12.5 mg daily.   5. Dizziness/head fullness: He is uncertain if this was present at the time of his MI as a possible anginal equivalent. Check carotid artery ultrasound as he is concerned for possible "blockage." If his cardiac workup is unrevealing I have advised him he will need to follow up with his PCP for this.   6. HLD: Continue Lipitor 80 mg daily. LDL at goal with normal LFT in 03/2017.   Disposition: F/u with Dr. Saunders Revel pending the above workup.   Current medicines are reviewed at length with the patient today.  The patient did not have any concerns regarding medicines.  Signed, Christell Faith, PA-C 09/19/2017 2:22 PM     [place] [address] [place] [place], [place] [address] [phone]

## 2017-09-16 NOTE — H&P (View-Only) (Signed)
Cardiology Office Note Date:  09/19/2017  Patient ID:  Edward Vacha., DOB 02/23/1950, MRN [id number] PCP:  Margo Common, PA  Cardiologist:  Dr. Saunders Revel, MD    Chief Complaint: ED follow up  History of Present Illness: Edward Lukas. is a 67 y.o. male with history of CAD s/p late-presenting anterior STEMI in 09/8914, chronic systolic CHF secondary to ICM complicated by apical thrombus resolved following anticoagulation x 3 months, HTN, and HLD who presents for ED follow up of chest pain.  He was admitted in 03/2016 with a late-presenting anterior wall STEMI. Cardiac cath on 03/09/2016 showed thrombotic occlusion of the proximal/mid LAD involving D1 and D2, mild to moderate nonobstructive CAD involving the LCx and RCA. He underwent successful IVUS-guided PCI to proximal and mid LAD using Integrity Resolute 3.0 x 38 mm DES with 0% residual stenosis and TIMI-3 flow. D2 was jailed by the LAD stent resulting in 70% ostial stenosis with TIMI-3 flow. TTE 03/10/16 showed an EF of 30-35% with LAD territory AK, likely mural thrombus at the LV apex. Repeat echo in 06/2016 showed EF 35-40%, mid and apical anterior/anteroseptal AK, no evidence of LV thrombus, Gr1DD, mild biatrial enlargement, normal RVSF. Myoview in 08/2016 showed no significant ischemia, fixed defect in the mid and distal LAD distribution, LVEF 45%, low to intermediate risk study. He was seen in the ED on 03/13/17 with atypical chest pain. EKG was stable and troponin was negative x 2. He was seen in the office for follow up on 03/23/17 and reported occasional episodes of chest and upper abdominal pain that he atributed to heartburn and gas. He was noted to have been riding a stationary bike for 15 miles per day without symptoms. Weight of 208 pounds. He was started on Pepcid for possible empiric treatment of GERD with improvement in symptoms when he was last seen in the office on 04/18/2017.  He was seen in the [facility] ED on 09/15/2017 with chest pain.   Troponin negative x1, WBC 7.3, hemoglobin 14.7, serum creatinine 0.75, potassium 4.1.  Chest x-ray without active cardiopulmonary disease.  EKG showed sinus bradycardia, 55 bpm, first-degree AV block, left axis deviation, left anterior fascicular block, incomplete right bundle branch block.  He was discharged to outpatient follow-up.  He comes in today with several complaints. He has noted for the past 2 weeks a fairly constant chest pressure with associated bilateral arm heaviness/tingling that is similar to how he felt in 03/2016 when he had his late-presenting MI as detailed above. Pain does not worsen with exertion, though he has not truly been exerting himself lately. He has been complaint with his medications. He recently purchased a BP cuff on 09/18/2017 with home readings ranging from 945 systolic to 038 systolic on 8/82/80. BP cuff was brought to his appointment today and is comparable to our readings: office BP cuff 148/94; patient BP cuff 151/103. Lastly, he notes over this same time period a fullness/pressure in his head with possible dizziness. He is not certain if this symptom was present when he had his MI in 03/2016. He denies any presyncope or syncope. No palpitations or vision changes. He has not yet spoken with his PCP regarding his head fullness. He was started on Carafate by the ED MD on 09/15/17 for possible gastritis playing a role in the above. He notes he continues to have the chest pressure.   Past Medical History:  Diagnosis Date  . Anxiety   . Apical mural thrombus  a. s/p 3 months of anticoagulation  . BPH (benign prostatic hyperplasia)   . Coronary artery disease    a. LHC 2/18: thrombotic occlusion of p-mLAD involving D1/D2, mild to mod nonobs CAD of LCx & RCA, successful IVUS-guided PCI to p-mLAD (Integrity Resolute 3.0 x 38 mm DES) w/ 0% residual stenosis & TIMI-3 flow, D2 jailed by LAD stent resulting in 70% ostial stenosis w/ TIMI-3 flow. b. MV 7/18: no sig isch, mod in  size fixed mid & apical anterosep * apical defect, EF 45%, low to mod risk  . Hyperlipidemia   . Hypertension   . Ischemic cardiomyopathy    a. TTE 2/18:  EF of 30-35% with LAD territory AK, likely mural thrombus at the LV apex; b. TTE 5/18: EF 35-40%, mid and apical anterior/anteroseptal AK, no evidence of LV thrombus, Gr1DD, mild biatrial enlargement, normal RVSF  . Myocardial infarction [facility])     Past Surgical History:  Procedure Laterality Date  . COLONOSCOPY WITH PROPOFOL N/A 04/22/2017   Procedure: COLONOSCOPY WITH PROPOFOL;  Surgeon: Jonathon Bellows, MD;  Location: [facility] ENDOSCOPY;  Service: Gastroenterology;  Laterality: N/A;  . CORONARY ANGIOGRAPHY N/A 03/09/2016   Procedure: Coronary Angiography;  Surgeon: Nelva Bush, MD;  Location: [place] CV LAB;  Service: Cardiovascular;  Laterality: N/A;  . CORONARY STENT INTERVENTION N/A 03/09/2016   Procedure: Coronary Stent Intervention;  Surgeon: Nelva Bush, MD;  Location: [place] CV LAB;  Service: Cardiovascular;  Laterality: N/A;  . HERNIA REPAIR  2005  . HYDROCELE EXCISION      Current Meds  Medication Sig  . aspirin EC 81 MG tablet Take 1 tablet (81 mg total) by mouth daily.  Marland Kitchen atorvastatin (LIPITOR) 80 MG tablet Take 1 tablet (80 mg total) by mouth daily.  . carvedilol (COREG) 3.125 MG tablet Take 1 tablet (3.125 mg total) by mouth 2 (two) times daily.  . clopidogrel (PLAVIX) 75 MG tablet Take 1 tablet (75 mg total) by mouth daily.  . famotidine (PEPCID) 20 MG tablet Take 20 mg by mouth 2 (two) times daily.  Marland Kitchen losartan (COZAAR) 25 MG tablet Take 1 tablet (25 mg total) by mouth daily.  . nitroGLYCERIN (NITROSTAT) 0.4 MG SL tablet Place 1 tablet (0.4 mg total) under the tongue every 5 (five) minutes as needed for chest pain. Maximum of 3 doses.  Marland Kitchen spironolactone (ALDACTONE) 25 MG tablet Take 0.5 tablets (12.5 mg total) by mouth daily.  . sucralfate (CARAFATE) 1 g tablet Take 1 tablet (1 g total) by mouth 4 (four) times  daily.    Allergies:   Patient has no known allergies.   Social History:  The patient  reports that he has quit smoking. He started smoking about 10 years ago. He has never used smokeless tobacco. He reports that he drinks about 3.0 standard drinks of alcohol per week. He reports that he has current or past drug history. Drug: Marijuana.   Family History:  The patient's family history includes Emphysema in his mother; Heart attack in his father and mother; Macular degeneration in his mother.  ROS:   Review of Systems  Constitutional: Positive for malaise/fatigue. Negative for chills, diaphoresis, fever and weight loss.  HENT: Negative for congestion.   Eyes: Negative for discharge and redness.  Respiratory: Negative for cough, hemoptysis, sputum production, shortness of breath and wheezing.   Cardiovascular: Positive for chest pain. Negative for palpitations, orthopnea, claudication, leg swelling and PND.  Gastrointestinal: Positive for heartburn. Negative for abdominal pain, blood in stool, melena,  nausea and vomiting.  Genitourinary: Negative for hematuria.  Musculoskeletal: Negative for falls and myalgias.  Skin: Negative for rash.  Neurological: Positive for dizziness, weakness and headaches. Negative for tingling, tremors, sensory change, speech change, focal weakness, seizures and loss of consciousness.  Endo/Heme/Allergies: Does not bruise/bleed easily.  Psychiatric/Behavioral: Negative for substance abuse. The patient is nervous/anxious.   All other systems reviewed and are negative.    PHYSICAL EXAM:  VS:  BP (!) 148/94 (BP Location: Left Arm, Patient Position: Sitting, Cuff Size: Normal)   Pulse 70   Ht 5\' 10"  (1.778 m)   Wt 202 lb 4 oz (91.7 kg)   BMI 29.02 kg/m  BMI: Body mass index is 29.02 kg/m.  Physical Exam  Constitutional: He is oriented to person, place, and time. He appears well-developed and well-nourished.  HENT:  Head: Normocephalic and atraumatic.  Eyes:  Right eye exhibits no discharge. Left eye exhibits no discharge.  Neck: Normal range of motion. No JVD present.  Cardiovascular: Normal rate, regular rhythm, S1 normal, S2 normal and normal heart sounds. Exam reveals no distant heart sounds, no friction rub, no midsystolic click and no opening snap.  No murmur heard. Pulses:      Posterior tibial pulses are 2+ on the right side, and 2+ on the left side.  Pulmonary/Chest: Effort normal and breath sounds normal. No respiratory distress. He has no decreased breath sounds. He has no wheezes. He has no rales. He exhibits no tenderness.  Abdominal: Soft. He exhibits no distension. There is no tenderness.  Musculoskeletal: He exhibits no edema.  Neurological: He is alert and oriented to person, place, and time.  Skin: Skin is warm and dry. No cyanosis. Nails show no clubbing.  Psychiatric: He has a normal mood and affect. His speech is normal and behavior is normal. Judgment and thought content normal.     EKG:  Was ordered and interpreted by me today. Shows NSR, 62 bpm, left axis deviation, incomplete RBBB, prior septal infarct, nonspecific st/t changes   Recent Labs: 03/25/2017: ALT 22; TSH 1.310 09/15/2017: BUN 18; Creatinine, Ser 0.75; Hemoglobin 14.7; Platelets 195; Potassium 4.1; Sodium 140  03/25/2017: Chol/HDL Ratio 2.3; Cholesterol, Total 102; HDL 44; LDL Calculated 48; Triglycerides 51   Estimated Creatinine Clearance: 104.8 mL/min (by C-G formula based on SCr of 0.75 mg/dL).   Wt Readings from Last 3 Encounters:  09/19/17 202 lb 4 oz (91.7 kg)  09/15/17 200 lb (90.7 kg)  09/05/17 212 lb (96.2 kg)     Other studies reviewed: Additional studies/records reviewed today include: summarized above  ASSESSMENT AND PLAN:  1. CAD involving the native coronary arteries with unstable angina: Currently, symptom-free. His symptoms are both typical and atypical. It is concerning that he feels like they are similar to his prior angina. In this  setting, we have discussed ischemic evaluation options including invasive and noninvasive. He would prefer to proceed with a LHC to definitively evaluate for ischemia playing a role in his symptoms. He has been scheduled for a LHC with Dr. Saunders Revel on 09/20/2017. Check echo. Continue ASA, Plavix, Coreg (increased dose as below), and Lipitor. Risks and benefits of cardiac catheterization have been discussed with the patient including risks of bleeding, bruising, infection, kidney damage, stroke, heart attack, and death. The patient understands these risks and is willing to proceed with the procedure. All questions have been answered and concerns listened to.   2. HFrEF secondary to ICM: He does not appear to be grossly volume up at  this time. Continue Coreg at 6.25 mg bid (increase dose today as below), losartan 25 mg daily, and spironolactone 12.5 mg daily. Check echo.  3. Prior mural thrombus: Resolved on follow up echo in 06/2016. No longer on anticoagulation.   4. HTN: Blood pressure is poorly controlled. Cannot exclude anxiety playing a role in his above symptoms and elevated BP. Increase Coreg to 6.25 mg bid. He will otherwise continue losartan 25 mg daily and spironolactone 12.5 mg daily.   5. Dizziness/head fullness: He is uncertain if this was present at the time of his MI as a possible anginal equivalent. Check carotid artery ultrasound as he is concerned for possible "blockage." If his cardiac workup is unrevealing I have advised him he will need to follow up with his PCP for this.   6. HLD: Continue Lipitor 80 mg daily. LDL at goal with normal LFT in 03/2017.   Disposition: F/u with Dr. Saunders Revel pending the above workup.   Current medicines are reviewed at length with the patient today.  The patient did not have any concerns regarding medicines.  Signed, Edward Faith, PA-C 09/19/2017 2:22 PM     [place] [address] [place], [place] [address] [phone]

## 2017-09-19 ENCOUNTER — Other Ambulatory Visit: Payer: Self-pay | Admitting: Physician Assistant

## 2017-09-19 ENCOUNTER — Ambulatory Visit (INDEPENDENT_AMBULATORY_CARE_PROVIDER_SITE_OTHER): Payer: Medicare Other | Admitting: Physician Assistant

## 2017-09-19 ENCOUNTER — Encounter: Payer: Self-pay | Admitting: Physician Assistant

## 2017-09-19 VITALS — BP 148/94 | HR 70 | Ht 70.0 in | Wt 202.2 lb

## 2017-09-19 DIAGNOSIS — I255 Ischemic cardiomyopathy: Secondary | ICD-10-CM | POA: Diagnosis not present

## 2017-09-19 DIAGNOSIS — I25110 Atherosclerotic heart disease of native coronary artery with unstable angina pectoris: Secondary | ICD-10-CM

## 2017-09-19 DIAGNOSIS — I5022 Chronic systolic (congestive) heart failure: Secondary | ICD-10-CM

## 2017-09-19 DIAGNOSIS — R42 Dizziness and giddiness: Secondary | ICD-10-CM

## 2017-09-19 DIAGNOSIS — E785 Hyperlipidemia, unspecified: Secondary | ICD-10-CM

## 2017-09-19 DIAGNOSIS — I10 Essential (primary) hypertension: Secondary | ICD-10-CM | POA: Diagnosis not present

## 2017-09-19 DIAGNOSIS — I236 Thrombosis of atrium, auricular appendage, and ventricle as current complications following acute myocardial infarction: Secondary | ICD-10-CM

## 2017-09-19 DIAGNOSIS — I25118 Atherosclerotic heart disease of native coronary artery with other forms of angina pectoris: Secondary | ICD-10-CM | POA: Insufficient documentation

## 2017-09-19 DIAGNOSIS — I2510 Atherosclerotic heart disease of native coronary artery without angina pectoris: Secondary | ICD-10-CM | POA: Insufficient documentation

## 2017-09-19 MED ORDER — CARVEDILOL 6.25 MG PO TABS: 6.2500 mg | ORAL_TABLET | Freq: Two times a day (BID) | ORAL | 3 refills | Status: DC

## 2017-09-19 NOTE — Patient Instructions (Addendum)
Medication Instructions:  Your physician has recommended you make the following change in your medication:  1- INCREASE Carvedilol to 6.25 mg by mouth two times a day.   Labwork: none  Testing/Procedures: Your physician has requested that you have an echocardiogram. Echocardiography is a painless test that uses sound waves to create images of your heart. It provides your doctor with information about the size and shape of your heart and how well your heart's chambers and valves are working. This procedure takes approximately one hour. There are no restrictions for this procedure.  Your physician has requested that you have a carotid duplex. This test is an ultrasound of the carotid arteries in your neck. It looks at blood flow through these arteries that supply the brain with blood. Allow one hour for this exam. There are no restrictions or special instructions.  [facility] Cardiac Cath Instructions   You are scheduled for a Cardiac Cath on:___08/20/19____  ARRIVAL TIME of 07:30 am to the [place].  Please expect a call from our [place] to pre-register you  Do not eat/drink anything after midnight  Someone will need to drive you home  It is recommended someone be with you for the first 24 hours after your procedure  Wear clothes that are easy to get on/off and wear slip on shoes if possible   Medications bring a current list of all medications with you  ___ You may take all of your medications the morning of your procedure with enough water to swallow safely  ___ Do not take these medications before your procedure:__________Spirolactone______________   Day of your procedure: Arrive at the [facility] entrance.  Free valet service is available.  After entering the [facility] please check-in at the registration desk (1st desk on your right) to receive your armband. After receiving your armband someone will escort you to the cardiac cath/special procedures  waiting area.  The usual length of stay after your procedure is about 2 to 3 hours.  This can vary.  If you have any questions, please call our office at [phone], or you may call the cardiac cath lab at [facility] directly at [phone]   Follow-Up: Your physician recommends that you schedule a follow-up appointment in: to be determined.  If you need a refill on your cardiac medications before your next appointment, please call your pharmacy.    Echocardiogram An echocardiogram, or echocardiography, uses sound waves (ultrasound) to produce an image of your heart. The echocardiogram is simple, painless, obtained within a short period of time, and offers valuable information to your health care provider. The images from an echocardiogram can provide information such as:  Evidence of coronary artery disease (CAD).  Heart size.  Heart muscle function.  Heart valve function.  Aneurysm detection.  Evidence of a past heart attack.  Fluid buildup around the heart.  Heart muscle thickening.  Assess heart valve function.  Tell a health care provider about:  Any allergies you have.  All medicines you are taking, including vitamins, herbs, eye drops, creams, and over-the-counter medicines.  Any problems you or family members have had with anesthetic medicines.  Any blood disorders you have.  Any surgeries you have had.  Any medical conditions you have.  Whether you are pregnant or may be pregnant. What happens before the procedure? No special preparation is needed. Eat and drink normally. What happens during the procedure?  In order to produce an image of your heart, gel will be applied to your  chest and a wand-like tool (transducer) will be moved over your chest. The gel will help transmit the sound waves from the transducer. The sound waves will harmlessly bounce off your heart to allow the heart images to be captured in real-time motion. These images will then be  recorded.  You may need an IV to receive a medicine that improves the quality of the pictures. What happens after the procedure? You may return to your normal schedule including diet, activities, and medicines, unless your health care provider tells you otherwise. This information is not intended to replace advice given to you by your health care provider. Make sure you discuss any questions you have with your health care provider. Document Released: 01/16/2000 Document Revised: 09/06/2015 Document Reviewed: 09/25/2012 Elsevier Interactive Patient Education  2017 [employer].

## 2017-09-19 NOTE — Progress Notes (Signed)
Orders for cardiac cath have been signed and held.

## 2017-09-20 ENCOUNTER — Telehealth: Payer: Self-pay | Admitting: Internal Medicine

## 2017-09-20 ENCOUNTER — Ambulatory Visit
Admission: RE | Admit: 2017-09-20 | Discharge: 2017-09-20 | Disposition: A | Discharge status: Home / Self Care | Payer: Medicare Other | Source: Ambulatory Visit | Attending: Internal Medicine | Admitting: Internal Medicine

## 2017-09-20 ENCOUNTER — Encounter
Admission: RE | Disposition: A | Discharge status: Home / Self Care | Payer: Self-pay | Source: Ambulatory Visit | Attending: Internal Medicine

## 2017-09-20 ENCOUNTER — Encounter: Payer: Self-pay | Admitting: *Deleted

## 2017-09-20 DIAGNOSIS — Z7982 Long term (current) use of aspirin: Secondary | ICD-10-CM | POA: Insufficient documentation

## 2017-09-20 DIAGNOSIS — N400 Enlarged prostate without lower urinary tract symptoms: Secondary | ICD-10-CM | POA: Diagnosis not present

## 2017-09-20 DIAGNOSIS — I25110 Atherosclerotic heart disease of native coronary artery with unstable angina pectoris: Secondary | ICD-10-CM | POA: Diagnosis not present

## 2017-09-20 DIAGNOSIS — E785 Hyperlipidemia, unspecified: Secondary | ICD-10-CM | POA: Insufficient documentation

## 2017-09-20 DIAGNOSIS — I25118 Atherosclerotic heart disease of native coronary artery with other forms of angina pectoris: Secondary | ICD-10-CM | POA: Insufficient documentation

## 2017-09-20 DIAGNOSIS — I110 Hypertensive heart disease with heart failure: Secondary | ICD-10-CM | POA: Diagnosis not present

## 2017-09-20 DIAGNOSIS — Z87891 Personal history of nicotine dependence: Secondary | ICD-10-CM | POA: Diagnosis not present

## 2017-09-20 DIAGNOSIS — Z9889 Other specified postprocedural states: Secondary | ICD-10-CM | POA: Diagnosis not present

## 2017-09-20 DIAGNOSIS — Z86718 Personal history of other venous thrombosis and embolism: Secondary | ICD-10-CM | POA: Insufficient documentation

## 2017-09-20 DIAGNOSIS — R079 Chest pain, unspecified: Secondary | ICD-10-CM

## 2017-09-20 DIAGNOSIS — Z8249 Family history of ischemic heart disease and other diseases of the circulatory system: Secondary | ICD-10-CM | POA: Diagnosis not present

## 2017-09-20 DIAGNOSIS — Z955 Presence of coronary angioplasty implant and graft: Secondary | ICD-10-CM | POA: Diagnosis not present

## 2017-09-20 DIAGNOSIS — Z7902 Long term (current) use of antithrombotics/antiplatelets: Secondary | ICD-10-CM | POA: Diagnosis not present

## 2017-09-20 DIAGNOSIS — I2510 Atherosclerotic heart disease of native coronary artery without angina pectoris: Secondary | ICD-10-CM | POA: Insufficient documentation

## 2017-09-20 DIAGNOSIS — I5022 Chronic systolic (congestive) heart failure: Secondary | ICD-10-CM | POA: Insufficient documentation

## 2017-09-20 DIAGNOSIS — F419 Anxiety disorder, unspecified: Secondary | ICD-10-CM | POA: Insufficient documentation

## 2017-09-20 DIAGNOSIS — I252 Old myocardial infarction: Secondary | ICD-10-CM | POA: Diagnosis not present

## 2017-09-20 DIAGNOSIS — Z79899 Other long term (current) drug therapy: Secondary | ICD-10-CM | POA: Diagnosis not present

## 2017-09-20 DIAGNOSIS — I255 Ischemic cardiomyopathy: Secondary | ICD-10-CM | POA: Diagnosis not present

## 2017-09-20 HISTORY — PX: LEFT HEART CATH AND CORONARY ANGIOGRAPHY: CATH118249

## 2017-09-20 SURGERY — LEFT HEART CATH AND CORONARY ANGIOGRAPHY
Anesthesia: Moderate Sedation | Laterality: Left

## 2017-09-20 MED ORDER — SODIUM CHLORIDE 0.9% FLUSH: 3.0000 mL | Freq: Two times a day (BID) | INTRAVENOUS | Status: DC

## 2017-09-20 MED ORDER — SODIUM CHLORIDE 0.9% FLUSH: 3.0000 mL | INTRAVENOUS | Status: DC | PRN

## 2017-09-20 MED ORDER — VERAPAMIL HCL 2.5 MG/ML IV SOLN
INTRAVENOUS | Status: AC
  Filled 2017-09-20: qty 2

## 2017-09-20 MED ORDER — SODIUM CHLORIDE 0.9 % IV SOLN: 250.0000 mL | INTRAVENOUS | Status: DC | PRN

## 2017-09-20 MED ORDER — HEPARIN (PORCINE) IN NACL 1000-0.9 UT/500ML-% IV SOLN
INTRAVENOUS | Status: AC
  Filled 2017-09-20: qty 1000

## 2017-09-20 MED ORDER — SODIUM CHLORIDE 0.9 % IV SOLN: INTRAVENOUS | Status: DC

## 2017-09-20 MED ORDER — MIDAZOLAM HCL 2 MG/2ML IJ SOLN
INTRAMUSCULAR | Status: DC | PRN
  Administered 2017-09-20: 1 mg via INTRAVENOUS

## 2017-09-20 MED ORDER — HEPARIN SODIUM (PORCINE) 1000 UNIT/ML IJ SOLN
INTRAMUSCULAR | Status: DC | PRN
  Administered 2017-09-20: 4500 [IU] via INTRAVENOUS

## 2017-09-20 MED ORDER — RANOLAZINE ER 500 MG PO TB12: 500.0000 mg | ORAL_TABLET | Freq: Two times a day (BID) | ORAL | 5 refills | Status: DC

## 2017-09-20 MED ORDER — FENTANYL CITRATE (PF) 100 MCG/2ML IJ SOLN
INTRAMUSCULAR | Status: AC
  Filled 2017-09-20: qty 2

## 2017-09-20 MED ORDER — FENTANYL CITRATE (PF) 100 MCG/2ML IJ SOLN
INTRAMUSCULAR | Status: DC | PRN
  Administered 2017-09-20: 50 ug via INTRAVENOUS

## 2017-09-20 MED ORDER — ACETAMINOPHEN 325 MG PO TABS: 650.0000 mg | ORAL_TABLET | ORAL | Status: DC | PRN

## 2017-09-20 MED ORDER — ONDANSETRON HCL 4 MG/2ML IJ SOLN: 4.0000 mg | Freq: Four times a day (QID) | INTRAMUSCULAR | Status: DC | PRN

## 2017-09-20 MED ORDER — MIDAZOLAM HCL 2 MG/2ML IJ SOLN
INTRAMUSCULAR | Status: AC
  Filled 2017-09-20: qty 2

## 2017-09-20 MED ORDER — HEPARIN SODIUM (PORCINE) 1000 UNIT/ML IJ SOLN
INTRAMUSCULAR | Status: AC
  Filled 2017-09-20: qty 1

## 2017-09-20 MED ORDER — SODIUM CHLORIDE 0.9 % IV BOLUS
270.0000 mL | Freq: Once | INTRAVENOUS | Status: AC
  Administered 2017-09-20: 1000 mL via INTRAVENOUS

## 2017-09-20 MED ORDER — VERAPAMIL HCL 2.5 MG/ML IV SOLN
INTRAVENOUS | Status: DC | PRN
  Administered 2017-09-20: 2.5 mg via INTRA_ARTERIAL

## 2017-09-20 SURGICAL SUPPLY — 8 items
CATH INFINITI 5FR ANG PIGTAIL (CATHETERS) ×3 IMPLANT
CATH INFINITI 5FR TG (CATHETERS) ×3 IMPLANT
DEVICE RAD TR BAND REGULAR (VASCULAR PRODUCTS) ×3 IMPLANT
KIT MANI 3VAL PERCEP (MISCELLANEOUS) ×3 IMPLANT
NEEDLE PERC 21GX4CM (NEEDLE) ×3 IMPLANT
PACK CARDIAC CATH (CUSTOM PROCEDURE TRAY) ×3 IMPLANT
SHEATH RAIN RADIAL 21G 6FR (SHEATH) ×3 IMPLANT
WIRE ROSEN-J .035X260CM (WIRE) ×3 IMPLANT

## 2017-09-20 NOTE — Telephone Encounter (Signed)
Per Patient wife [facility] will be faxing over a tier exception form  For ranexa

## 2017-09-20 NOTE — Interval H&P Note (Signed)
History and Physical Interval Note:  09/20/2017 8:11 AM  Edward [name].  has presented today for cardiac catheterization, with the diagnosis of unstable angina. The various methods of treatment have been discussed with the patient and family. After consideration of risks, benefits and other options for treatment, the patient has consented to  Procedure(s): LEFT HEART CATH AND CORONARY ANGIOGRAPHY (Left) as a surgical intervention .  The patient's history has been reviewed, patient examined, no change in status, stable for surgery.  I have reviewed the patient's chart and labs.  Questions were answered to the patient's satisfaction.    Cath Lab Visit (complete for each Cath Lab visit)  Clinical Evaluation Leading to the Procedure:   ACS: Yes.    Non-ACS:    Anginal Classification: CCS IV  Anti-ischemic medical therapy: Minimal Therapy (1 class of medications)  Non-Invasive Test Results: No non-invasive testing performed  Prior CABG: No previous CABG  Edward [name]

## 2017-09-20 NOTE — Brief Op Note (Signed)
Brief Cardiac Catheterization Note  Date: 09/20/2017 Time: 9:10 AM  PATIENT:  [name]  67 y.o. male  PRE-OPERATIVE DIAGNOSIS:  Unstable angina  POST-OPERATIVE DIAGNOSIS:  Same  PROCEDURE:  Procedure(s): LEFT HEART CATH AND CORONARY ANGIOGRAPHY (Left)  SURGEON:  Surgeon(s) and Role:    Nelva Bush, MD - Primary  PHYSICIAN ASSISTANT:   FINDINGS: 1. Overall stable appearance of the coronary arteries with diffuse mild to moderate disease. 2. Widely patent proximal/mid LAD stent.  Jailed diagonal branch has 70% ostial stenosis. 3. Normal LVEDP. 4. Mildly reduced LVEF with apical and apical inferior hypokinesis.  RECOMMENDATIONS: 1. Medical therapy.  Will start ranolazine 500 mg BID. 2. Outpatient f/u with me or APP in ~2 weeks.  Nelva Bush, MD [facility] Pager: [phone]

## 2017-09-21 ENCOUNTER — Other Ambulatory Visit: Payer: Self-pay | Admitting: Physician Assistant

## 2017-09-21 DIAGNOSIS — R079 Chest pain, unspecified: Secondary | ICD-10-CM

## 2017-09-21 DIAGNOSIS — I25119 Atherosclerotic heart disease of native coronary artery with unspecified angina pectoris: Secondary | ICD-10-CM

## 2017-09-21 NOTE — Telephone Encounter (Signed)
Received incoming call from [facility] Rx rep. They needed [place] to answer questions for patient to receive tier exception for ranolazine. Questions answered and submitted. We will receive a fax once determination has been made. Ref # [id number].

## 2017-09-23 ENCOUNTER — Telehealth: Payer: Self-pay | Admitting: Internal Medicine

## 2017-09-23 NOTE — Telephone Encounter (Signed)
Pt c/o medication issue:  1. Name of Medication: Coreg   2. How are you currently taking this medication (dosage and times per day)? 3.125 mg po q BID changed to 6.25 po BID  3. Are you having a reaction (difficulty breathing--STAT)?na  4. What is your medication issue? Pharmacy calling to clarify med change please call.  If no answer leave vm with first and last name.

## 2017-09-23 NOTE — Telephone Encounter (Signed)
Called and verified that per Christell Faith, PA  "INCREASE Carvedilol to 6.25 mg by mouth two times a day."

## 2017-09-26 ENCOUNTER — Encounter: Payer: Self-pay | Admitting: Family Medicine

## 2017-09-26 ENCOUNTER — Ambulatory Visit (INDEPENDENT_AMBULATORY_CARE_PROVIDER_SITE_OTHER): Payer: Medicare Other | Admitting: Family Medicine

## 2017-09-26 VITALS — BP 110/68 | HR 57 | Temp 98.0°F | Resp 16 | Wt 202.8 lb

## 2017-09-26 DIAGNOSIS — I255 Ischemic cardiomyopathy: Secondary | ICD-10-CM

## 2017-09-26 DIAGNOSIS — N489 Disorder of penis, unspecified: Secondary | ICD-10-CM

## 2017-09-26 DIAGNOSIS — K219 Gastro-esophageal reflux disease without esophagitis: Secondary | ICD-10-CM

## 2017-09-26 DIAGNOSIS — L603 Nail dystrophy: Secondary | ICD-10-CM | POA: Diagnosis not present

## 2017-09-26 NOTE — Patient Instructions (Addendum)
Continue to monitor your toenail and penile lesion to resolution. Return for non-improvement and new symptoms. We can consider G.I. Referral after you cardiology evaluation is completed. Try lorazepam and see if it helps with your head pressure sensation.

## 2017-09-26 NOTE — Progress Notes (Signed)
  Subjective:     Patient ID: [name], male   DOB: 01/07/1951, 67 y.o.   MRN: [id number] Chief Complaint  Patient presents with  . Follow-up    Patient comes in office today to follow up for penile lesion from 09/05/17. Patient states that lesion is still present but denies any changes in size or pain.    HPI Also here to recheck left toenail changes. He has since been to the ER for chest pain and head pressure with stable coronary arteries/stent on cardiac catherization. He is pending further evaluation with echocardiogram and carotid dopplers. His chest pressure responded to use of Carafate. He is concerned about  G.I. problems due to his hx of GERD treated with famotidine.  Review of Systems     Objective:   Physical Exam  Constitutional: He appears well-developed and well-nourished. No distress.  Skin:  Left fourth toenail with two small areas of brown discoloration which appear to be growing out with the toenail. Penis with nearly resolved erosion on his glans penis.  Psychiatric:  Anxious about his general health       Assessment:    1. Dystrophic nail; continue to monitor  2. Lesion of penis: monitor to resolution  3. Gastroesophageal reflux disease, esophagitis presence not specified: complete Carafate     Plan:    Consider PPI and/or G.I. Referral. Will call for new symptoms. Try lorazepam and see if it helps with head pressure sensation which is still present.

## 2017-09-28 NOTE — Telephone Encounter (Signed)
[facility] has approved Ranolazine ER 500 mg tablet from 09/20/17 until 01/31/18. This approval allows the prescription to pay on a lower tier (tiering exception).

## 2017-10-04 ENCOUNTER — Other Ambulatory Visit: Payer: Medicare Other

## 2017-10-06 ENCOUNTER — Ambulatory Visit: Payer: Medicare Other | Admitting: Nurse Practitioner

## 2017-10-13 ENCOUNTER — Ambulatory Visit (INDEPENDENT_AMBULATORY_CARE_PROVIDER_SITE_OTHER): Payer: Medicare Other

## 2017-10-13 ENCOUNTER — Other Ambulatory Visit: Payer: Self-pay

## 2017-10-13 DIAGNOSIS — R079 Chest pain, unspecified: Secondary | ICD-10-CM | POA: Diagnosis not present

## 2017-10-13 DIAGNOSIS — R42 Dizziness and giddiness: Secondary | ICD-10-CM

## 2017-10-13 DIAGNOSIS — I25119 Atherosclerotic heart disease of native coronary artery with unspecified angina pectoris: Secondary | ICD-10-CM | POA: Diagnosis not present

## 2017-10-17 ENCOUNTER — Telehealth: Payer: Self-pay | Admitting: Family Medicine

## 2017-10-17 NOTE — Telephone Encounter (Signed)
Pt wants to know if he can get a refill on  Lorazapam . He does not know the MG he was taking.  He was told by Mikki Santee that he could start back on them for his headaches.   He uses [facility]  Pt's CB #  [phone]  Thanks Con Memos

## 2017-10-18 ENCOUNTER — Other Ambulatory Visit: Payer: Self-pay | Admitting: Family Medicine

## 2017-10-18 DIAGNOSIS — F419 Anxiety disorder, unspecified: Secondary | ICD-10-CM

## 2017-10-18 MED ORDER — LORAZEPAM 0.5 MG PO TABS: 0.5000 mg | ORAL_TABLET | Freq: Three times a day (TID) | ORAL | 0 refills | Status: DC | PRN

## 2017-10-18 NOTE — Telephone Encounter (Signed)
Refill sent in

## 2017-10-20 ENCOUNTER — Encounter: Payer: Self-pay | Admitting: Nurse Practitioner

## 2017-10-20 ENCOUNTER — Ambulatory Visit (INDEPENDENT_AMBULATORY_CARE_PROVIDER_SITE_OTHER): Payer: Medicare Other | Admitting: Nurse Practitioner

## 2017-10-20 VITALS — BP 116/62 | HR 51 | Ht 70.0 in | Wt 202.0 lb

## 2017-10-20 DIAGNOSIS — I255 Ischemic cardiomyopathy: Secondary | ICD-10-CM | POA: Diagnosis not present

## 2017-10-20 DIAGNOSIS — I5022 Chronic systolic (congestive) heart failure: Secondary | ICD-10-CM | POA: Diagnosis not present

## 2017-10-20 DIAGNOSIS — I10 Essential (primary) hypertension: Secondary | ICD-10-CM | POA: Diagnosis not present

## 2017-10-20 DIAGNOSIS — E785 Hyperlipidemia, unspecified: Secondary | ICD-10-CM | POA: Diagnosis not present

## 2017-10-20 DIAGNOSIS — I2510 Atherosclerotic heart disease of native coronary artery without angina pectoris: Secondary | ICD-10-CM

## 2017-10-20 MED ORDER — CARVEDILOL 3.125 MG PO TABS: 3.1250 mg | ORAL_TABLET | Freq: Two times a day (BID) | ORAL | 3 refills | Status: DC

## 2017-10-20 NOTE — Progress Notes (Signed)
Office Visit    Patient Name: Edward [name]. Date of Encounter: 10/20/2017  Primary Care Provider:  Margo Common, PA Primary Cardiologist:  Nelva Bush, MD  Chief Complaint    67 y/o ? with a history of coronary artery disease status post prior anterior STEMI, chronic systolic congestive heart failure, ischemic cardiomyopathy previously comp gated by apical thrombus, hypertension, and hyperlipidemia, who presents for follow-up related to chest pain and recent diagnostic catheterization.  Past Medical History    Past Medical History:  Diagnosis Date  . Anxiety   . Apical mural thrombus    a. s/p 3 months of anticoagulation  . BPH (benign prostatic hyperplasia)   . Coronary artery disease    a. 03/2016: LM nl, LAD thrombotic occlusion (3.0x38 Resolute Integrity DES), D1 70 (jailed), mild to mod nonobs LCx & RCA dzs; b. MV 7/18: Mod mid/apical antsept defect, no isch, EF 45%->Med Rx; c. 09/2017 Cath: LM nl, LAD patent stent, D1 70ost (jailed), LCX 50p, OM1 100, OM2 30, RCA 30p, RPAV 50, EF 50-55%->Med Rx.  . HFrEF (heart failure with reduced ejection fraction) ([place])    a. TTE 2/18:  EF of 30-35% (in setting of MI); b. TTE 5/18: EF 35-40%; c. 10/2017 Echo: EF 40-45%, mid-apicalantsept, ant, ap sev HK, Gr1 DD. Mild BAE.  Marland Kitchen Hyperlipidemia   . Hypertension   . Ischemic cardiomyopathy    a. TTE 2/18:  EF of 30-35% with LAD territory AK, likely mural thrombus at the LV apex; b. TTE 5/18: EF 35-40%, mid and apical anterior/anteroseptal AK, no evidence of LV thrombus, Gr1DD, mild BAE, normal RVSF; c. 10/2017 Echo: EF 40-45%, mid-apicalantsept, ant, ap sev HK, Gr1 DD. Mild BAE.  Marland Kitchen Myocardial infarction [facility])    Past Surgical History:  Procedure Laterality Date  . COLONOSCOPY WITH PROPOFOL N/A 04/22/2017   Procedure: COLONOSCOPY WITH PROPOFOL;  Surgeon: Jonathon Bellows, MD;  Location: [facility] ENDOSCOPY;  Service: Gastroenterology;  Laterality: N/A;  . CORONARY ANGIOGRAPHY N/A 03/09/2016   Procedure: Coronary Angiography;  Surgeon: Nelva Bush, MD;  Location: [place] CV LAB;  Service: Cardiovascular;  Laterality: N/A;  . CORONARY STENT INTERVENTION N/A 03/09/2016   Procedure: Coronary Stent Intervention;  Surgeon: Nelva Bush, MD;  Location: [place] CV LAB;  Service: Cardiovascular;  Laterality: N/A;  . HERNIA REPAIR  2005  . HYDROCELE EXCISION    . LEFT HEART CATH AND CORONARY ANGIOGRAPHY Left 09/20/2017   Procedure: LEFT HEART CATH AND CORONARY ANGIOGRAPHY;  Surgeon: Nelva Bush, MD;  Location: [place] CV LAB;  Service: Cardiovascular;  Laterality: Left;    Allergies  No Known Allergies  History of Present Illness    67 year old male with the above complex past medical history including coronary artery disease status post late presenting anterior ST segment elevation myocardial infarction in February 2018 requiring drug-eluting stent placement to the proximal and mid LAD.  He otherwise had nonobstructive disease.  The first diagonal did become jailed with a 70% ostial stenosis as result of the LAD stenting.  EF was 30 to 35% with follow-up echo in May 2018 showing slight improvement to 35 to 40%.  Stress testing July 2018 was intermediate in the setting of prior mid and distal LAD distribution infarct without ischemia.  He was recently evaluated in the ED and subsequently in clinic secondary to recurrent chest pain over 2-week period.  He was set up for outpatient Diagnostic Catheterization  which took place August 20th, revealing patent LAD stent with stable  diagonal disease and otherwise nonobstructive disease.  Medical therapy was recommended he was placed on Ranexa 500 mg every 12 hours.  Since his catheterization, he has done quite well without experiencing any chest discomfort or dyspnea.  He is tolerating Ranexa well.  His right wrist catheterization site has healed well.  He denies PND, orthopnea, palpitations, dizziness, syncope, edema, or early  satiety.  He reports that sometimes his hands go numb if he has not positioned in a certain way and this improves with repositioning.  Despite that, he is able to continue to play his physical without any upper extremity numbness or discomfort.  Home Medications    Prior to Admission medications   Medication Sig Start Date End Date Taking? Authorizing Provider  aspirin EC 81 MG tablet Take 1 tablet (81 mg total) by mouth daily. 06/16/16   End, Harrell Gave, MD  atorvastatin (LIPITOR) 80 MG tablet Take 1 tablet (80 mg total) by mouth daily. 07/14/17 10/12/17  End, Harrell Gave, MD  carvedilol (COREG) 6.25 MG tablet Take 1 tablet (6.25 mg total) by mouth 2 (two) times daily. 09/19/17   Rise Mu, PA-C  clopidogrel (PLAVIX) 75 MG tablet Take 1 tablet (75 mg total) by mouth daily. 07/14/17   End, Harrell Gave, MD  LORazepam (ATIVAN) 0.5 MG tablet Take 1 tablet (0.5 mg total) by mouth every 8 (eight) hours as needed for anxiety. 10/18/17   Carmon Ginsberg, PA  losartan (COZAAR) 25 MG tablet Take 1 tablet (25 mg total) by mouth daily. 07/14/17 10/12/17  End, Harrell Gave, MD  nitroGLYCERIN (NITROSTAT) 0.4 MG SL tablet Place 1 tablet (0.4 mg total) under the tongue every 5 (five) minutes as needed for chest pain. Maximum of 3 doses. 07/01/16 09/19/17  End, Harrell Gave, MD  ranolazine (RANEXA) 500 MG 12 hr tablet Take 1 tablet (500 mg total) by mouth 2 (two) times daily. 09/20/17   End, Harrell Gave, MD  spironolactone (ALDACTONE) 25 MG tablet Take 0.5 tablets (12.5 mg total) by mouth daily. 07/14/17 10/12/17  End, Harrell Gave, MD  sucralfate (CARAFATE) 1 g tablet Take 1 tablet (1 g total) by mouth 4 (four) times daily. 09/15/17   Nance Pear, MD    Review of Systems    He denies chest pain, palpitations, dyspnea, pnd, orthopnea, n, v, dizziness, syncope, edema, weight gain, or early satiety. Occas hand numbness, which has been positional.  All other systems reviewed and are otherwise negative except as noted  above.  Physical Exam    VS:  BP 116/62 (BP Location: Left Arm, Patient Position: Sitting, Cuff Size: Normal)   Pulse (!) 51   Ht 5\' 10"  (1.778 m)   Wt 202 lb (91.6 kg)   BMI 28.98 kg/m  , BMI Body mass index is 28.98 kg/m. GEN: Well nourished, well developed, in no acute distress. HEENT: normal. Neck: Supple, no JVD, carotid bruits, or masses. Cardiac: RRR, no murmurs, rubs, or gallops. No clubbing, cyanosis, edema.  Radials/DP/PT 2+ and equal bilaterally.  Respiratory:  Respirations regular and unlabored, clear to auscultation bilaterally. GI: Soft, nontender, nondistended, BS + x 4. MS: no deformity or atrophy. Skin: warm and dry, no rash. Neuro:  Strength and sensation are intact. Psych: Normal affect.  Accessory Clinical Findings    ECG personally reviewed by me today - SB, 51, LAD, inc RBBB, prior septal infarct - no acute changes.  Assessment & Plan    1.  Coronary artery disease: Status post prior anterior STEMI with drug-eluting stent placement to the LAD in 2018.  Recent diagnostic catheterization showed patency of the LAD stent with stable, jailed ostial diagonal lesion and otherwise nonobstructive disease.  He has since been on Ranexa and has not had any recurrent chest pain or dyspnea.  He otherwise remains on aspirin, statin, beta-blocker, Plavix, and ARB therapy.  2.  Chronic systolic congestive heart failure/ischemic cardiomyopathy: Recent echo showed EF of 40 to 45%.  Euvolemic on exam.  He remains on beta-blocker, ARB, and Spironolactone therapy.  His heart rate is 51 today and he mentions that his blood pressure at home is often in the 80s in the evenings.  In that setting, I am going to reduce his carvedilol back to 3.25 mg twice daily.  3.  Essential hypertension: Blood pressure stable today.  As above, he has been having hypotension with pressures in the 80s in the evenings.  Reducing carvedilol as above.  4.  Hyperlipidemia: LDL was 48 in February with normal  LFTs at that time.  Continue statin therapy.  5.  History of mural thrombus: Following anterior MI.  Resolved by echo in May 2018.  He is no longer on oral anticoagulation.  6.  Disposition: Follow-up in clinic in 3 months or sooner if necessary.   Murray Hodgkins, NP 10/20/2017, 10:23 AM

## 2017-10-20 NOTE — Patient Instructions (Signed)
Medication Instructions:  Your physician has recommended you make the following change in your medication:  1- DECREASE Coreg/Carvedilol to 3.125 mg (1/2 tablet) by mouth two times a day.   Labwork: none  Testing/Procedures: none  Follow-Up: Your physician recommends that you schedule a follow-up appointment in: 3 MONTHS WITH DR END.   If you need a refill on your cardiac medications before your next appointment, please call your pharmacy.

## 2017-11-21 ENCOUNTER — Other Ambulatory Visit: Payer: Self-pay | Admitting: Family Medicine

## 2017-11-21 DIAGNOSIS — F419 Anxiety disorder, unspecified: Secondary | ICD-10-CM

## 2017-11-21 NOTE — Telephone Encounter (Signed)
See refill request.

## 2017-11-25 ENCOUNTER — Ambulatory Visit (INDEPENDENT_AMBULATORY_CARE_PROVIDER_SITE_OTHER): Payer: Medicare Other | Admitting: Internal Medicine

## 2017-11-25 ENCOUNTER — Encounter: Payer: Self-pay | Admitting: Internal Medicine

## 2017-11-25 VITALS — BP 132/72 | HR 54 | Ht 70.0 in | Wt 202.0 lb

## 2017-11-25 DIAGNOSIS — I25118 Atherosclerotic heart disease of native coronary artery with other forms of angina pectoris: Secondary | ICD-10-CM

## 2017-11-25 DIAGNOSIS — I5022 Chronic systolic (congestive) heart failure: Secondary | ICD-10-CM | POA: Diagnosis not present

## 2017-11-25 DIAGNOSIS — Z79899 Other long term (current) drug therapy: Secondary | ICD-10-CM | POA: Diagnosis not present

## 2017-11-25 DIAGNOSIS — E785 Hyperlipidemia, unspecified: Secondary | ICD-10-CM | POA: Diagnosis not present

## 2017-11-25 MED ORDER — NITROGLYCERIN 0.4 MG SL SUBL: 0.4000 mg | SUBLINGUAL_TABLET | SUBLINGUAL | 4 refills | Status: DC | PRN

## 2017-11-25 NOTE — Progress Notes (Signed)
Follow-up Outpatient Visit Date: 11/25/2017  Primary Care Provider: Margo Common, [place] [address]  Chief Complaint: Follow-up coronary artery disease  HPI:  Mr. Altizer is a 67 y.o. year-old male with history of CAD status post PCI to the LAD in setting of anterior STEMI, chronic systolic heart failure secondary to ischemic cardiomyopathy complicated by apical thrombus (resolved after treatment with warfarin), hypertension, and hyperlipidemia, who presents for follow-up of chest pain.  He was last seen in our office by Ignacia Bayley, NP, a month ago.  He was doing well without recurrent chest pain after the addition of ranolazine 500 mg twice daily.  Carvedilol was decreased to 3.125 mg twice daily due to low blood pressures in the evenings at home.  Today, Mr. Thong reports that he has been feeling well.  He denies further chest pain since starting ranolazine.  He was able to get a tier reduction by his insurance company and is able to afford ranolazine going forward.  He denies shortness of breath, palpitations, lightheadedness, and edema.  --------------------------------------------------------------------------------------------------  Past Medical History:  Diagnosis Date  . Anxiety   . Apical mural thrombus    a. s/p 3 months of anticoagulation  . BPH (benign prostatic hyperplasia)   . Coronary artery disease    a. 03/2016: LM nl, LAD thrombotic occlusion (3.0x38 Resolute Integrity DES), D1 70 (jailed), mild to mod nonobs LCx & RCA dzs; b. MV 7/18: Mod mid/apical antsept defect, no isch, EF 45%->Med Rx; c. 09/2017 Cath: LM nl, LAD patent stent, D1 70ost (jailed), LCX 50p, OM1 100, OM2 30, RCA 30p, RPAV 50, EF 50-55%->Med Rx.  . HFrEF (heart failure with reduced ejection fraction) ([place])    a. TTE 2/18:  EF of 30-35% (in setting of MI); b. TTE 5/18: EF 35-40%; c. 10/2017 Echo: EF 40-45%, mid-apicalantsept, ant, ap sev HK, Gr1 DD. Mild BAE.  Marland Kitchen Hyperlipidemia    . Hypertension   . Ischemic cardiomyopathy    a. TTE 2/18:  EF of 30-35% with LAD territory AK, likely mural thrombus at the LV apex; b. TTE 5/18: EF 35-40%, mid and apical anterior/anteroseptal AK, no evidence of LV thrombus, Gr1DD, mild BAE, normal RVSF; c. 10/2017 Echo: EF 40-45%, mid-apicalantsept, ant, ap sev HK, Gr1 DD. Mild BAE.  Marland Kitchen Myocardial infarction [facility])    Past Surgical History:  Procedure Laterality Date  . COLONOSCOPY WITH PROPOFOL N/A 04/22/2017   Procedure: COLONOSCOPY WITH PROPOFOL;  Surgeon: Jonathon Bellows, MD;  Location: [facility] ENDOSCOPY;  Service: Gastroenterology;  Laterality: N/A;  . CORONARY ANGIOGRAPHY N/A 03/09/2016   Procedure: Coronary Angiography;  Surgeon: Nelva Bush, MD;  Location: [place] CV LAB;  Service: Cardiovascular;  Laterality: N/A;  . CORONARY STENT INTERVENTION N/A 03/09/2016   Procedure: Coronary Stent Intervention;  Surgeon: Nelva Bush, MD;  Location: [place] CV LAB;  Service: Cardiovascular;  Laterality: N/A;  . HERNIA REPAIR  2005  . HYDROCELE EXCISION    . LEFT HEART CATH AND CORONARY ANGIOGRAPHY Left 09/20/2017   Procedure: LEFT HEART CATH AND CORONARY ANGIOGRAPHY;  Surgeon: Nelva Bush, MD;  Location: [place] CV LAB;  Service: Cardiovascular;  Laterality: Left;    Current Meds  Medication Sig  . aspirin EC 81 MG tablet Take 1 tablet (81 mg total) by mouth daily.  Marland Kitchen atorvastatin (LIPITOR) 80 MG tablet Take 1 tablet (80 mg total) by mouth daily.  . carvedilol (COREG) 3.125 MG tablet Take 1 tablet (3.125 mg total) by mouth 2 (two)  times daily.  . clopidogrel (PLAVIX) 75 MG tablet Take 1 tablet (75 mg total) by mouth daily.  Marland Kitchen LORazepam (ATIVAN) 0.5 MG tablet TAKE 1 TABLET (0.5 MG TOTAL) BY MOUTH EVERY 8 (EIGHT) HOURS AS NEEDED FOR ANXIETY.  . ranolazine (RANEXA) 500 MG 12 hr tablet Take 1 tablet (500 mg total) by mouth 2 (two) times daily.    Allergies: Patient has no known allergies.  Social History   Tobacco Use    . Smoking status: Former Smoker    Start date: 07/03/2007  . Smokeless tobacco: Never Used  . Tobacco comment: QUIT IN 2005  Substance Use Topics  . Alcohol use: Yes    Alcohol/week: 3.0 standard drinks    Types: 3 Cans of beer per week  . Drug use: Yes    Types: Marijuana    Comment: smokes pot    Family History  Problem Relation Age of Onset  . Emphysema Mother   . Macular degeneration Mother   . Heart attack Mother   . Heart attack Father     Review of Systems: A 12-system review of systems was performed and was negative except as noted in the HPI.  --------------------------------------------------------------------------------------------------  Physical Exam: BP 132/72 (BP Location: Left Arm, Patient Position: Sitting, Cuff Size: Normal)   Pulse (!) 54   Ht 5\' 10"  (1.778 m)   Wt 202 lb (91.6 kg)   BMI 28.98 kg/m   General:  NAD HEENT: No conjunctival pallor or scleral icterus. Moist mucous membranes.  OP clear. Neck: Supple without lymphadenopathy, thyromegaly, JVD, or HJR. No carotid bruit. Lungs: Normal work of breathing. Clear to auscultation bilaterally without wheezes or crackles. Heart: Regular rate and rhythm without murmurs, rubs, or gallops. Non-displaced PMI. Abd: Bowel sounds present. Soft, NT/ND without hepatosplenomegaly Ext: No lower extremity edema. 1+ radial pulses bilaterally. Skin: Warm and dry without rash.  EKG:  Sinus bradycardia with left axis deviation, RBBB, and septal Q-waves.  Lab Results  Component Value Date   WBC 7.3 09/15/2017   HGB 14.7 09/15/2017   HCT 42.8 09/15/2017   MCV 98.0 09/15/2017   PLT 195 09/15/2017    Lab Results  Component Value Date   NA 140 09/15/2017   K 4.1 09/15/2017   CL 105 09/15/2017   CO2 28 09/15/2017   BUN 18 09/15/2017   CREATININE 0.75 09/15/2017   GLUCOSE 102 (H) 09/15/2017   ALT 22 03/25/2017    Lab Results  Component Value Date   CHOL 102 03/25/2017   HDL 44 03/25/2017   LDLCALC 48  03/25/2017   TRIG 51 03/25/2017   CHOLHDL 2.3 03/25/2017    --------------------------------------------------------------------------------------------------  ASSESSMENT AND PLAN: Coronary artery disease with stable angina Angina well-controlled with carvedilol and ranolazine.  We will continue with indefinite DAPT, as tolerated.  No medication changes today.  Chronic systolic heart failure due to ischemic cardiomyopathy Mr. Tomasik appears euvolemic and well-compensated with NYHA class I-II HF symptoms.  His BP is upper normal today; we discussed escalation of losartan.  He notes that his BP is typically around 528 mmHg systolic and he is hesitant to increase losartan at this time.  I advised him to contact us if his SBP is consistent > 120 mmHg, at which time we will readdress increasing losartan to optimize his evidence-based HF regimen.  Bradycardia precludes escalation of carvedilol.  We will continue spironolactone 12.5 mg daily and plan to repeat labs in January.  Hyperlipidemia LDL at goal (<70) on last check in  03/2017.  We will plan to repeat a fasting lipid panel and LFT's in January.  For now, we will continue atorvastatin 80 mg daily.  Follow-up: Return to clinic in 6 months.  Nelva Bush, MD 11/25/2017 1:54 PM

## 2017-11-25 NOTE — Patient Instructions (Signed)
Medication Instructions:  Your physician recommends that you continue on your current medications as directed. Please refer to the Current Medication list given to you today.  If you need a refill on your cardiac medications before your next appointment, please call your pharmacy.   Lab work: Your physician recommends that you return for lab work in: 3 months - January 2020 - to check your lipid, cmet, and CBC.  - Go to the [facility] in [place] will need to be fasting. - Please go to the [facility]. You will check in at the front desk to the right as you walk into the atrium. Valet Parking is offered if needed.   If you have labs (blood work) drawn today and your tests are completely normal, you will receive your results only by: Marland Kitchen MyChart Message (if you have MyChart) OR . A paper copy in the mail If you have any lab test that is abnormal or we need to change your treatment, we will call you to review the results.  Testing/Procedures: none  Follow-Up: At [facility], you and your health needs are our priority.  As part of our continuing mission to provide you with exceptional heart care, we have created designated Provider Care Teams.  These Care Teams include your primary Cardiologist (physician) and Advanced Practice Providers (APPs -  Physician Assistants and Nurse Practitioners) who all work together to provide you with the care you need, when you need it. You will need a follow up appointment in 6 months.  Please call our office 2 months in advance to schedule this appointment.  You may see Nelva Bush, MD or one of the following Advanced Practice Providers on your designated Care Team:   Murray Hodgkins, NP Christell Faith, PA-C . Marrianne Mood, PA-C

## 2018-01-10 ENCOUNTER — Telehealth: Payer: Self-pay | Admitting: Internal Medicine

## 2018-01-10 NOTE — Telephone Encounter (Signed)
New Message   Patient is calling because in the past he had a tier reduction for the Renaxa. In the past he had it but [facility] Mess is asking for a new tier reduction paperwork. Please assist.

## 2018-01-10 NOTE — Telephone Encounter (Signed)
This pt is seen in our [place] office. Will forward this message to them.

## 2018-01-11 NOTE — Telephone Encounter (Signed)
Encounter is done 

## 2018-01-11 NOTE — Telephone Encounter (Signed)
Called [facility] and was transferred to the Benefits department, [phone]. Representative completed process of Tier exception over the phone and Ranexa is approved from February 01, 2018 to February 01, 2019.  Called patient and he is aware that the approval for Tier Exception is complete and was very appreciative.

## 2018-01-11 NOTE — Telephone Encounter (Signed)
Mary from [facility] calling back, was disconnected from [place] Please call [phone]

## 2018-01-18 ENCOUNTER — Ambulatory Visit: Payer: Medicare Other | Admitting: Internal Medicine

## 2018-01-30 ENCOUNTER — Other Ambulatory Visit: Payer: Self-pay

## 2018-01-30 ENCOUNTER — Telehealth: Payer: Self-pay | Admitting: Internal Medicine

## 2018-01-30 MED ORDER — SPIRONOLACTONE 25 MG PO TABS: 12.5000 mg | ORAL_TABLET | Freq: Every day | ORAL | 0 refills | Status: DC

## 2018-01-30 MED ORDER — ATORVASTATIN CALCIUM 80 MG PO TABS: 80.0000 mg | ORAL_TABLET | Freq: Every day | ORAL | 0 refills | Status: DC

## 2018-01-30 NOTE — Telephone Encounter (Signed)
Requested Prescriptions   Signed Prescriptions Disp Refills  . atorvastatin (LIPITOR) 80 MG tablet 90 tablet 0    Sig: Take 1 tablet (80 mg total) by mouth daily.    Authorizing Provider: END, CHRISTOPHER    Ordering User: Janan Ridge spironolactone (ALDACTONE) 25 MG tablet 45 tablet 0    Sig: Take 0.5 tablets (12.5 mg total) by mouth daily.    Authorizing Provider: END, CHRISTOPHER    Ordering User: Janan Ridge

## 2018-01-30 NOTE — Telephone Encounter (Signed)
°*  STAT* If patient is at the pharmacy, call can be transferred to refill team.   1. Which medications need to be refilled? (please list name of each medication and dose if known)  Atorvastatin (LIPITOR) 80 MG - 1 tablet daily spironolactone (ALDACTONE) 25 MG - 0.5 tablet daily  2. Which pharmacy/location (including street and city if local pharmacy) is medication to be sent to? [facility]   3. Do they need a 30 day or 90 day supply? 90 day

## 2018-01-31 ENCOUNTER — Other Ambulatory Visit: Payer: Self-pay

## 2018-01-31 MED ORDER — SPIRONOLACTONE 25 MG PO TABS: 12.5000 mg | ORAL_TABLET | Freq: Every day | ORAL | 0 refills | Status: DC

## 2018-01-31 MED ORDER — ATORVASTATIN CALCIUM 80 MG PO TABS: 80.0000 mg | ORAL_TABLET | Freq: Every day | ORAL | 0 refills | Status: DC

## 2018-01-31 NOTE — Telephone Encounter (Signed)
Patient calling  Will need an emergency prescription for each medication - [facility] will take up to 7 to 10 business days and patient will be out of medication tomorrow Please call with any questions [facility] [place]

## 2018-01-31 NOTE — Telephone Encounter (Signed)
Refills sent for 1 week per Pharmacy  Requested Prescriptions   Pending Prescriptions Disp Refills  . spironolactone (ALDACTONE) 25 MG tablet 4 tablet 0    Sig: Take 0.5 tablets (12.5 mg total) by mouth daily.  Marland Kitchen atorvastatin (LIPITOR) 80 MG tablet 30 tablet 0    Sig: Take 1 tablet (80 mg total) by mouth daily for 7 days.

## 2018-01-31 NOTE — Telephone Encounter (Signed)
30 day sent to local pharmacy.   Requested Prescriptions   Signed Prescriptions Disp Refills  . spironolactone (ALDACTONE) 25 MG tablet 15 tablet 0    Sig: Take 0.5 tablets (12.5 mg total) by mouth daily.    Authorizing Provider: END, CHRISTOPHER    Ordering User: Janan Ridge atorvastatin (LIPITOR) 80 MG tablet 30 tablet 0    Sig: Take 1 tablet (80 mg total) by mouth daily.    Authorizing Provider: END, CHRISTOPHER    Ordering User: Janan Ridge

## 2018-01-31 NOTE — Telephone Encounter (Signed)
Refills sent for 1 week per pharmacy   Requested Prescriptions   Pending Prescriptions Disp Refills  . spironolactone (ALDACTONE) 25 MG tablet 4 tablet 0    Sig: Take 0.5 tablets (12.5 mg total) by mouth daily.  Marland Kitchen atorvastatin (LIPITOR) 80 MG tablet 30 tablet 0    Sig: Take 1 tablet (80 mg total) by mouth daily for 7 days.

## 2018-01-31 NOTE — Telephone Encounter (Signed)
Requested Prescriptions   Signed Prescriptions Disp Refills  . spironolactone (ALDACTONE) 25 MG tablet 15 tablet 0    Sig: Take 0.5 tablets (12.5 mg total) by mouth daily.    Authorizing Provider: END, CHRISTOPHER    Ordering User: Janan Ridge atorvastatin (LIPITOR) 80 MG tablet 30 tablet 0    Sig: Take 1 tablet (80 mg total) by mouth daily.    Authorizing Provider: END, CHRISTOPHER    Ordering User: Janan Ridge

## 2018-01-31 NOTE — Telephone Encounter (Signed)
Patient pharmacy would not fill for 30 day but they can so a 1 week supply .  Please resend per patient request.

## 2018-02-09 DIAGNOSIS — Z23 Encounter for immunization: Secondary | ICD-10-CM | POA: Diagnosis not present

## 2018-02-23 ENCOUNTER — Telehealth: Payer: Self-pay | Admitting: Family Medicine

## 2018-02-23 ENCOUNTER — Other Ambulatory Visit: Payer: Self-pay | Admitting: *Deleted

## 2018-02-23 ENCOUNTER — Telehealth: Payer: Self-pay | Admitting: Internal Medicine

## 2018-02-23 MED ORDER — RANOLAZINE ER 500 MG PO TB12: 500.0000 mg | ORAL_TABLET | Freq: Two times a day (BID) | ORAL | 0 refills | Status: DC

## 2018-02-23 NOTE — Telephone Encounter (Signed)
°*  STAT* If patient is at the pharmacy, call can be transferred to refill team.   1. Which medications need to be refilled? (please list name of each medication and dose if known) Ranexa 500 mg po BID   2. Which pharmacy/location (including street and city if local pharmacy) is medication to be sent to?[facility] [address] [place]   3. Do they need a 30 day or 90 day supply? [place]

## 2018-02-23 NOTE — Telephone Encounter (Signed)
Requested Prescriptions   Signed Prescriptions Disp Refills  . ranolazine (RANEXA) 500 MG 12 hr tablet 180 tablet 0    Sig: Take 1 tablet (500 mg total) by mouth 2 (two) times daily.    Authorizing Provider: END, CHRISTOPHER    Ordering User: Britt Bottom

## 2018-02-24 ENCOUNTER — Ambulatory Visit (INDEPENDENT_AMBULATORY_CARE_PROVIDER_SITE_OTHER): Payer: Medicare Other | Admitting: Family Medicine

## 2018-02-24 ENCOUNTER — Encounter: Payer: Self-pay | Admitting: Family Medicine

## 2018-02-24 ENCOUNTER — Other Ambulatory Visit (INDEPENDENT_AMBULATORY_CARE_PROVIDER_SITE_OTHER): Payer: Medicare Other

## 2018-02-24 VITALS — BP 110/70 | HR 60 | Temp 97.8°F | Resp 16 | Wt 209.8 lb

## 2018-02-24 DIAGNOSIS — I5022 Chronic systolic (congestive) heart failure: Secondary | ICD-10-CM

## 2018-02-24 DIAGNOSIS — Z79899 Other long term (current) drug therapy: Secondary | ICD-10-CM

## 2018-02-24 DIAGNOSIS — I25119 Atherosclerotic heart disease of native coronary artery with unspecified angina pectoris: Secondary | ICD-10-CM | POA: Diagnosis not present

## 2018-02-24 DIAGNOSIS — E785 Hyperlipidemia, unspecified: Secondary | ICD-10-CM | POA: Diagnosis not present

## 2018-02-24 DIAGNOSIS — I10 Essential (primary) hypertension: Secondary | ICD-10-CM

## 2018-02-24 DIAGNOSIS — F419 Anxiety disorder, unspecified: Secondary | ICD-10-CM | POA: Diagnosis not present

## 2018-02-24 MED ORDER — LORAZEPAM 0.5 MG PO TABS: 0.5000 mg | ORAL_TABLET | Freq: Three times a day (TID) | ORAL | 0 refills | Status: DC | PRN

## 2018-02-24 NOTE — Progress Notes (Signed)
Patient: Edward [name]. Male    DOB: Sep 11, 1950   68 y.o.   MRN: [id number] Visit Date: 02/24/2018  Today's Provider: Vernie Murders, PA   No chief complaint on file.  Subjective:     HPI  Patient need a refill on the Lorazepam.  Patient here to follow-up Anxiety. Patient reports that he has been feeling stress but didn't have more of the Lorazepam. Reports that overall he feels stable.   GAD 7 : Generalized Anxiety Score 02/24/2018  Nervous, Anxious, on Edge 1  Control/stop worrying 1  Worry too much - different things 1  Trouble relaxing 0  Restless 1  Easily annoyed or irritable 0  Afraid - awful might happen 0  Total GAD 7 Score 4  Anxiety Difficulty Not difficult at all   [facility]  Past Medical History:  Diagnosis Date  . Anxiety   . Apical mural thrombus    a. s/p 3 months of anticoagulation  . BPH (benign prostatic hyperplasia)   . Coronary artery disease    a. 03/2016: LM nl, LAD thrombotic occlusion (3.0x38 Resolute Integrity DES), D1 70 (jailed), mild to mod nonobs LCx & RCA dzs; b. MV 7/18: Mod mid/apical antsept defect, no isch, EF 45%->Med Rx; c. 09/2017 Cath: LM nl, LAD patent stent, D1 70ost (jailed), LCX 50p, OM1 100, OM2 30, RCA 30p, RPAV 50, EF 50-55%->Med Rx.  . HFrEF (heart failure with reduced ejection fraction) ([place])    a. TTE 2/18:  EF of 30-35% (in setting of MI); b. TTE 5/18: EF 35-40%; c. 10/2017 Echo: EF 40-45%, mid-apicalantsept, ant, ap sev HK, Gr1 DD. Mild BAE.  Marland Kitchen Hyperlipidemia   . Hypertension   . Ischemic cardiomyopathy    a. TTE 2/18:  EF of 30-35% with LAD territory AK, likely mural thrombus at the LV apex; b. TTE 5/18: EF 35-40%, mid and apical anterior/anteroseptal AK, no evidence of LV thrombus, Gr1DD, mild BAE, normal RVSF; c. 10/2017 Echo: EF 40-45%, mid-apicalantsept, ant, ap sev HK, Gr1 DD. Mild BAE.  Marland Kitchen Myocardial infarction [facility])    Past Surgical History:    Procedure Laterality Date  . COLONOSCOPY WITH PROPOFOL N/A 04/22/2017   Procedure: COLONOSCOPY WITH PROPOFOL;  Surgeon: Jonathon Bellows, MD;  Location: [facility] ENDOSCOPY;  Service: Gastroenterology;  Laterality: N/A;  . CORONARY ANGIOGRAPHY N/A 03/09/2016   Procedure: Coronary Angiography;  Surgeon: Nelva Bush, MD;  Location: [place] CV LAB;  Service: Cardiovascular;  Laterality: N/A;  . CORONARY STENT INTERVENTION N/A 03/09/2016   Procedure: Coronary Stent Intervention;  Surgeon: Nelva Bush, MD;  Location: [place] CV LAB;  Service: Cardiovascular;  Laterality: N/A;  . HERNIA REPAIR  2005  . HYDROCELE EXCISION    . LEFT HEART CATH AND CORONARY ANGIOGRAPHY Left 09/20/2017   Procedure: LEFT HEART CATH AND CORONARY ANGIOGRAPHY;  Surgeon: Nelva Bush, MD;  Location: [place] CV LAB;  Service: Cardiovascular;  Laterality: Left;   Family History  Problem Relation Age of Onset  . Emphysema Mother   . Macular degeneration Mother   . Heart attack Mother   . Heart attack Father    No Known Allergies  Current Outpatient Medications:  .  aspirin EC 81 MG tablet, Take 1 tablet (81 mg total) by mouth daily., Disp: 90 tablet, Rfl: 3 .  atorvastatin (LIPITOR) 80 MG tablet, Take 1 tablet (80 mg total) by mouth daily for 7 days., Disp:  30 tablet, Rfl: 0 .  carvedilol (COREG) 3.125 MG tablet, Take 1 tablet (3.125 mg total) by mouth 2 (two) times daily., Disp: 180 tablet, Rfl: 3 .  clopidogrel (PLAVIX) 75 MG tablet, Take 1 tablet (75 mg total) by mouth daily., Disp: 90 tablet, Rfl: 2 .  LORazepam (ATIVAN) 0.5 MG tablet, TAKE 1 TABLET (0.5 MG TOTAL) BY MOUTH EVERY 8 (EIGHT) HOURS AS NEEDED FOR ANXIETY., Disp: 30 tablet, Rfl: 0 .  losartan (COZAAR) 25 MG tablet, Take 1 tablet (25 mg total) by mouth daily., Disp: 180 tablet, Rfl: 2 .  nitroGLYCERIN (NITROSTAT) 0.4 MG SL tablet, Place 1 tablet (0.4 mg total) under the tongue every 5 (five) minutes as needed for chest pain. Maximum of 3  doses., Disp: 25 tablet, Rfl: 4 .  ranolazine (RANEXA) 500 MG 12 hr tablet, Take 1 tablet (500 mg total) by mouth 2 (two) times daily., Disp: 180 tablet, Rfl: 0 .  spironolactone (ALDACTONE) 25 MG tablet, Take 0.5 tablets (12.5 mg total) by mouth daily., Disp: 4 tablet, Rfl: 0  Review of Systems  Constitutional: Negative.        Gained 7 lbs from poor diet.  HENT: Negative.   Respiratory: Negative.   Cardiovascular: Negative.   Gastrointestinal: Negative.   Genitourinary: Negative.     Social History   Tobacco Use  . Smoking status: Former Smoker    Start date: 07/03/2007  . Smokeless tobacco: Never Used  . Tobacco comment: QUIT IN 2005  Substance Use Topics  . Alcohol use: Yes    Alcohol/week: 7.0 standard drinks    Types: 7 Cans of beer per week     Objective:   BP 110/70 (BP Location: Right Arm, Patient Position: Sitting, Cuff Size: Large)   Pulse 60   Temp 97.8 F (36.6 C) (Oral)   Resp 16   Wt 209 lb 12.8 oz (95.2 kg)   SpO2 97%   BMI 30.10 kg/m    Wt Readings from Last 3 Encounters:  02/24/18 209 lb 12.8 oz (95.2 kg)  11/25/17 202 lb (91.6 kg)  10/20/17 202 lb (91.6 kg)   Vitals:   02/24/18 0817  BP: 110/70  Pulse: 60  Resp: 16  Temp: 97.8 F (36.6 C)  TempSrc: Oral  SpO2: 97%  Weight: 209 lb 12.8 oz (95.2 kg)   Physical Exam Constitutional:      General: He is not in acute distress.    Appearance: He is well-developed.  HENT:     Head: Normocephalic and atraumatic.     Right Ear: Hearing and tympanic membrane normal.     Left Ear: Hearing and tympanic membrane normal.     Nose: Nose normal.  Eyes:     General: Lids are normal. No scleral icterus.       Right eye: No discharge.        Left eye: No discharge.     Conjunctiva/sclera: Conjunctivae normal.  Neck:     Musculoskeletal: Neck supple.  Cardiovascular:     Rate and Rhythm: Normal rate and regular rhythm.     Pulses: Normal pulses.     Heart sounds: Normal heart sounds.  Pulmonary:       Effort: Pulmonary effort is normal. No respiratory distress.  Musculoskeletal: Normal range of motion.  Skin:    Findings: No lesion or rash.  Neurological:     Mental Status: He is alert and oriented to person, place, and time.  Psychiatric:  Speech: Speech normal.        Behavior: Behavior normal.        Thought Content: Thought content normal.       Assessment & Plan    1. Anxiety Intermittent use of Lorazepam for anxiety/panic. Worried about health but feeling better about home live with starting retirement soon. Sleeps an average of 6 hours at night starting after 2:00 AM. No daytime sleepiness. Recommend he exercise 30-40 minutes 4-5 days a week to help with anxiety control. Refilled the Ativan for prn use. - LORazepam (ATIVAN) 0.5 MG tablet; Take 1 tablet (0.5 mg total) by mouth every 8 (eight) hours as needed for anxiety.  Dispense: 30 tablet; Refill: 0  2. 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 Has gained 7 lbs since October 2019. Will be getting labs drawn at his cardiologist's office today (Dr. Saunders Revel). Continue present Lipitor dosage and work on a better low fat diet with exercise to lose weight.  3. Coronary artery disease involving native coronary artery of native heart with angina pectoris ([place]) Denies chest pains, edema, palpitations or dyspnea. Will have follow up of CHF with cardiologist soon. Continue Coreg 3.125 mg BID, Plavix 75 mg qd and Ranexa 500 mg BID per Dr. Saunders Revel.   4. Essential (primary) hypertension BP well controlled. Tolerating the Spironolactone 12.5 mg qd, Losartan 25 mg qd and Coreg (as above) without side effects. Will be getting blood tests done at cardiology office today. Continue efforts to reduce beer intake, follow a low fat diet and exercise regularly. Follow up pending lab reports.     Vernie Murders, PA   [facility]

## 2018-02-24 NOTE — Addendum Note (Signed)
Addended by: Janan Ridge on: 02/24/2018 09:40 AM   Modules accepted: Orders

## 2018-02-25 LAB — COMPREHENSIVE METABOLIC PANEL
ALK PHOS: 40 IU/L (ref 39–117)
ALT: 17 IU/L (ref 0–44)
AST: 17 IU/L (ref 0–40)
Albumin/Globulin Ratio: 2 (ref 1.2–2.2)
Albumin: 4.2 g/dL (ref 3.8–4.8)
BILIRUBIN TOTAL: 0.8 mg/dL (ref 0.0–1.2)
BUN / CREAT RATIO: 11 (ref 10–24)
BUN: 10 mg/dL (ref 8–27)
CHLORIDE: 103 mmol/L (ref 96–106)
CO2: 26 mmol/L (ref 20–29)
CREATININE: 0.95 mg/dL (ref 0.76–1.27)
Calcium: 9.2 mg/dL (ref 8.6–10.2)
GFR calc Af Amer: 96 mL/min/{1.73_m2} (ref >59)
GFR calc non Af Amer: 83 mL/min/{1.73_m2} (ref >59)
GLUCOSE: 108 mg/dL — AB (ref 65–99)
Globulin, Total: 2.1 g/dL (ref 1.5–4.5)
Potassium: 4.5 mmol/L (ref 3.5–5.2)
Sodium: 141 mmol/L (ref 134–144)
Total Protein: 6.3 g/dL (ref 6.0–8.5)

## 2018-02-25 LAB — CBC WITH DIFFERENTIAL/PLATELET
BASOS: 1 %
Basophils Absolute: 0 10*3/uL (ref 0.0–0.2)
EOS (ABSOLUTE): 0.1 10*3/uL (ref 0.0–0.4)
EOS: 2 %
HEMATOCRIT: 40.2 % (ref 37.5–51.0)
Hemoglobin: 14.2 g/dL (ref 13.0–17.7)
IMMATURE GRANS (ABS): 0 10*3/uL (ref 0.0–0.1)
IMMATURE GRANULOCYTES: 0 %
Lymphocytes Absolute: 1.2 10*3/uL (ref 0.7–3.1)
Lymphs: 24 %
MCH: 33.6 pg — ABNORMAL HIGH (ref 26.6–33.0)
MCHC: 35.3 g/dL (ref 31.5–35.7)
MCV: 95 fL (ref 79–97)
MONOS ABS: 0.4 10*3/uL (ref 0.1–0.9)
Monocytes: 9 %
NEUTROS ABS: 3.3 10*3/uL (ref 1.4–7.0)
NEUTROS PCT: 64 %
Platelets: 210 10*3/uL (ref 150–450)
RBC: 4.23 x10E6/uL (ref 4.14–5.80)
RDW: 12.1 % (ref 11.6–15.4)
WBC: 5 10*3/uL (ref 3.4–10.8)

## 2018-02-25 LAB — LIPID PANEL
CHOLESTEROL TOTAL: 105 mg/dL (ref 100–199)
Chol/HDL Ratio: 2.2 ratio (ref 0.0–5.0)
HDL: 47 mg/dL (ref >39)
LDL Calculated: 43 mg/dL (ref 0–99)
TRIGLYCERIDES: 76 mg/dL (ref 0–149)
VLDL CHOLESTEROL CAL: 15 mg/dL (ref 5–40)

## 2018-02-28 ENCOUNTER — Other Ambulatory Visit: Payer: Medicare Other

## 2018-03-13 ENCOUNTER — Other Ambulatory Visit: Payer: Self-pay | Admitting: Internal Medicine

## 2018-03-22 ENCOUNTER — Telehealth: Payer: Self-pay | Admitting: Family Medicine

## 2018-03-22 NOTE — Telephone Encounter (Signed)
I called the patient to schedule his AWV-I with McKenzie (Welcome was 03/25/17).  He said that he was at lunch and would call back to schedule it. VDM (DD)

## 2018-03-31 ENCOUNTER — Telehealth: Payer: Self-pay | Admitting: Family Medicine

## 2018-03-31 NOTE — Telephone Encounter (Signed)
Pt calling to schedule his yearly AWV. Please call pt back at [phone].  Thanks, [employer]

## 2018-04-03 NOTE — Telephone Encounter (Signed)
Schedule AWV and CPE for 05/08/18 @ 10 and 10:40 AM. -MM

## 2018-04-14 ENCOUNTER — Other Ambulatory Visit: Payer: Self-pay | Admitting: Family Medicine

## 2018-04-14 DIAGNOSIS — F419 Anxiety disorder, unspecified: Secondary | ICD-10-CM

## 2018-04-14 MED ORDER — LORAZEPAM 0.5 MG PO TABS: 0.5000 mg | ORAL_TABLET | Freq: Three times a day (TID) | ORAL | 0 refills | Status: DC | PRN

## 2018-04-14 NOTE — Telephone Encounter (Signed)
Pt needs refill on   Lorazepam 0.5mg   [facility] [place]  CB#  [phone]  Thanks [employer]

## 2018-04-25 ENCOUNTER — Other Ambulatory Visit: Payer: Self-pay | Admitting: *Deleted

## 2018-04-25 ENCOUNTER — Telehealth: Payer: Self-pay | Admitting: Internal Medicine

## 2018-04-25 MED ORDER — CLOPIDOGREL BISULFATE 75 MG PO TABS: 75.0000 mg | ORAL_TABLET | Freq: Every day | ORAL | 0 refills | Status: DC

## 2018-04-25 MED ORDER — ATORVASTATIN CALCIUM 80 MG PO TABS: 80.0000 mg | ORAL_TABLET | Freq: Every day | ORAL | 0 refills | Status: DC

## 2018-04-25 MED ORDER — CARVEDILOL 3.125 MG PO TABS: 3.1250 mg | ORAL_TABLET | Freq: Two times a day (BID) | ORAL | 0 refills | Status: DC

## 2018-04-25 MED ORDER — SPIRONOLACTONE 25 MG PO TABS: 12.5000 mg | ORAL_TABLET | Freq: Every day | ORAL | 0 refills | Status: DC

## 2018-04-25 NOTE — Telephone Encounter (Signed)
Requested Prescriptions   Signed Prescriptions Disp Refills  . carvedilol (COREG) 3.125 MG tablet 180 tablet 0    Sig: Take 1 tablet (3.125 mg total) by mouth 2 (two) times daily.    Authorizing Provider: END, CHRISTOPHER    Ordering User: LOPEZ, MARINA C  . atorvastatin (LIPITOR) 80 MG tablet 90 tablet 0    Sig: Take 1 tablet (80 mg total) by mouth daily for 7 days.    Authorizing Provider: END, CHRISTOPHER    Ordering User: LOPEZ, MARINA C  . spironolactone (ALDACTONE) 25 MG tablet 45 tablet 0    Sig: Take 0.5 tablets (12.5 mg total) by mouth daily for 30 days.    Authorizing Provider: END, CHRISTOPHER    Ordering User: LOPEZ, MARINA C  . clopidogrel (PLAVIX) 75 MG tablet 90 tablet 0    Sig: Take 1 tablet (75 mg total) by mouth daily.    Authorizing Provider: END, CHRISTOPHER    Ordering User: Britt Bottom

## 2018-04-25 NOTE — Telephone Encounter (Signed)
°*  STAT* If patient is at the pharmacy, call can be transferred to refill team.   1. Which medications need to be refilled? (please list name of each medication and dose if known)  Clopidogrel 75 MG 1 tablet daily  Atorvastatin 80 MG 1 tablet daily Spironolactone 25 MG 0.5 tablet daily  Carvedilol 3.125 MG 1 tablet daily - patient wants to make sure [facility] has correct dosage, last time they were sending 6. MG   2. Which pharmacy/location (including street and city if local pharmacy) is medication to be sent to? [place]   3. Do they need a 30 day or 90 day supply? 90 day

## 2018-04-25 NOTE — Telephone Encounter (Signed)
Requested Prescriptions   Signed Prescriptions Disp Refills  . carvedilol (COREG) 3.125 MG tablet 180 tablet 0    Sig: Take 1 tablet (3.125 mg total) by mouth 2 (two) times daily.    Authorizing Provider: END, CHRISTOPHER    Ordering User: Derryl Uher C  . atorvastatin (LIPITOR) 80 MG tablet 90 tablet 0    Sig: Take 1 tablet (80 mg total) by mouth daily for 7 days.    Authorizing Provider: END, CHRISTOPHER    Ordering User: Jodelle Fausto C  . spironolactone (ALDACTONE) 25 MG tablet 45 tablet 0    Sig: Take 0.5 tablets (12.5 mg total) by mouth daily for 30 days.    Authorizing Provider: END, CHRISTOPHER    Ordering User: Jaryn Rosko C  . clopidogrel (PLAVIX) 75 MG tablet 90 tablet 0    Sig: Take 1 tablet (75 mg total) by mouth daily.    Authorizing Provider: END, CHRISTOPHER    Ordering User: Britt Bottom

## 2018-04-27 NOTE — Telephone Encounter (Signed)
Patient calling  States he spoke with [facility] and they had not received refill for carvedilol and spironolactone Informed patient receipts were confirmed for all 4 Patient will wait and check at a later time to ensure it went through and will call back if need be

## 2018-05-07 IMAGING — CT CT ANGIO CHEST
2 of 6 series · 18 of 46 positions shown · IV contrast (APPLIED)
Comparison: None.

CLINICAL DATA: 65-year-old male with a history of indigestion

EXAM:
CT ANGIOGRAPHY CHEST WITH CONTRAST
TECHNIQUE: Multidetector CT imaging of the chest was performed using the
standard protocol during bolus administration of intravenous
contrast. Multiplanar CT image reconstructions and MIPs were
obtained to evaluate the vascular anatomy.
CONTRAST:  75 cc Isovue 370

[Series 5: thins · axial · 0.82mm/px · z∈[-502,-228]mm · 16 of 302 slices shown]
[im 14/302  lung]
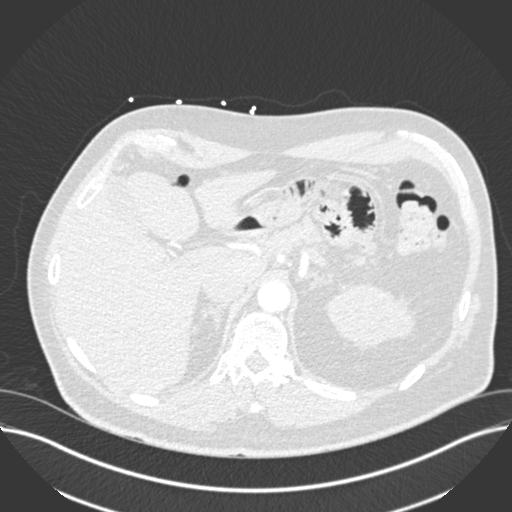
[im 40/302  soft-tissue]
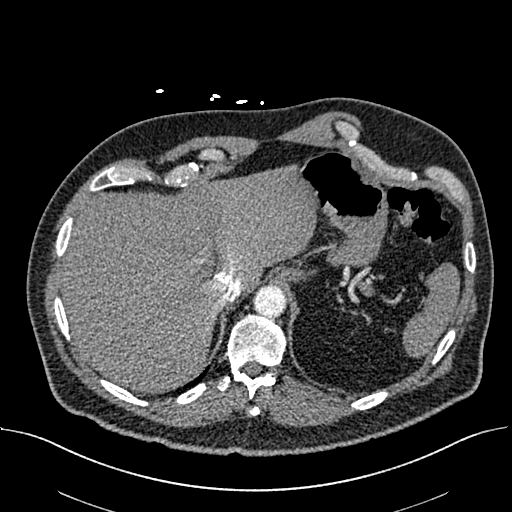
[im 53/302  lung]
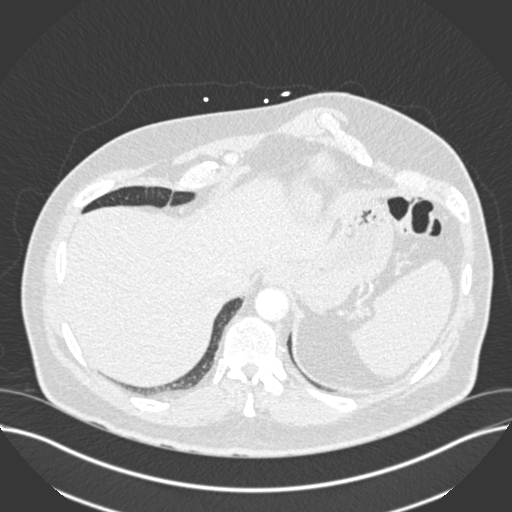
[im 66/302  soft-tissue]
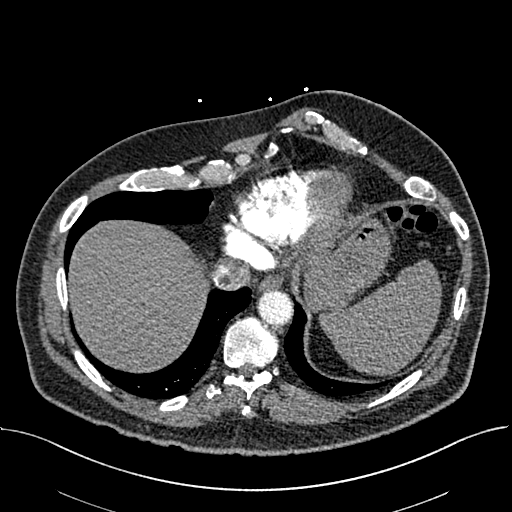
[im 92/302  lung]
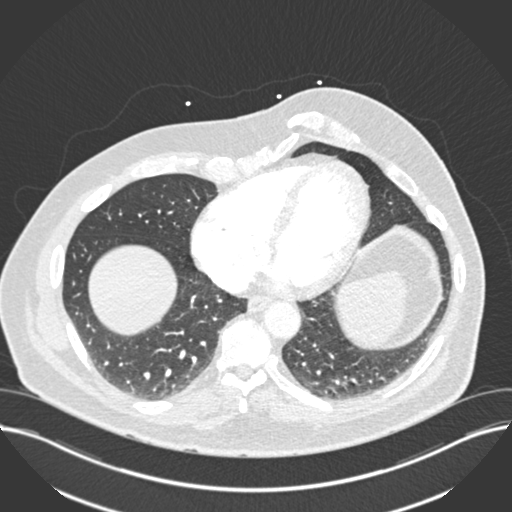
[im 105/302  soft-tissue]
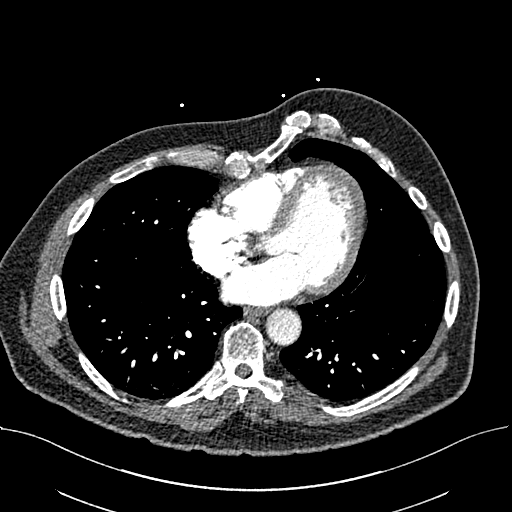
[im 118/302  lung]
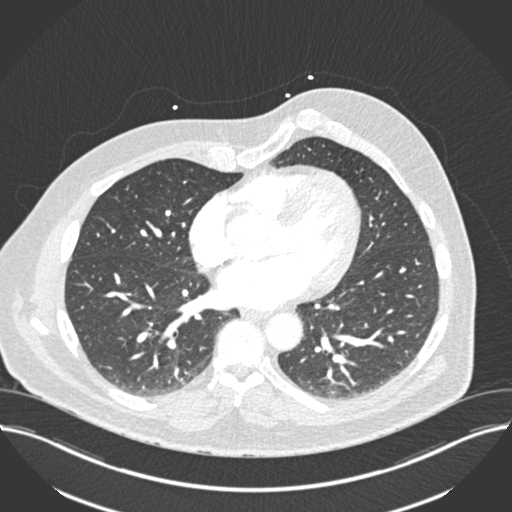
[im 144/302  soft-tissue]
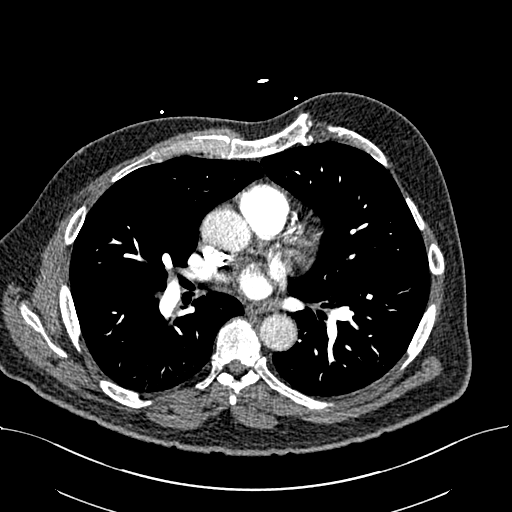
[im 158/302  lung]
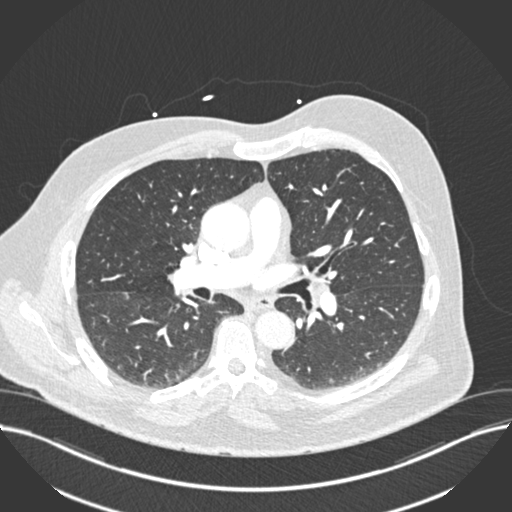
[im 184/302  soft-tissue]
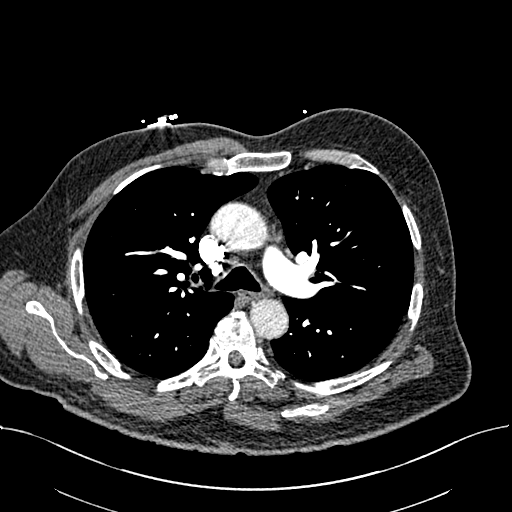
[im 197/302  lung]
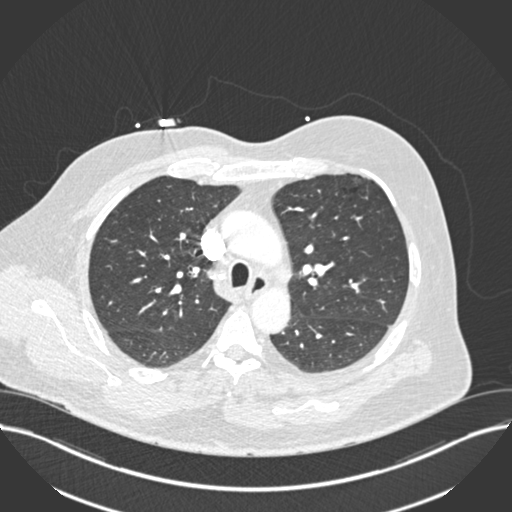
[im 210/302  soft-tissue]
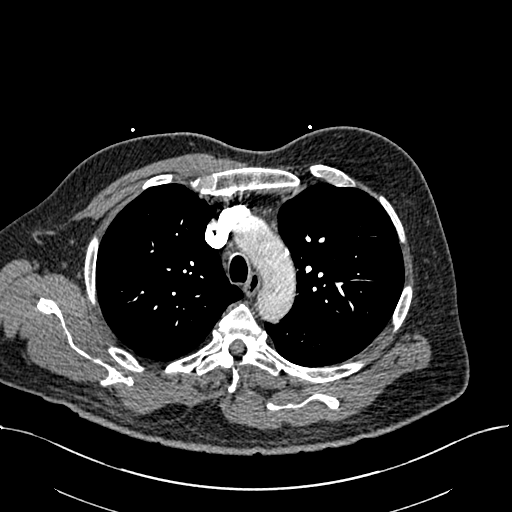
[im 236/302  lung]
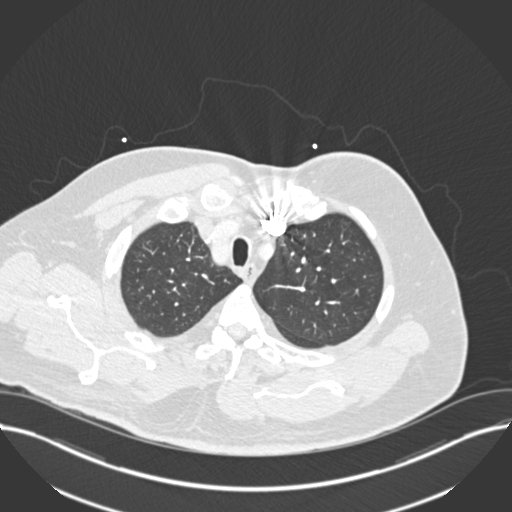
[im 249/302  soft-tissue]
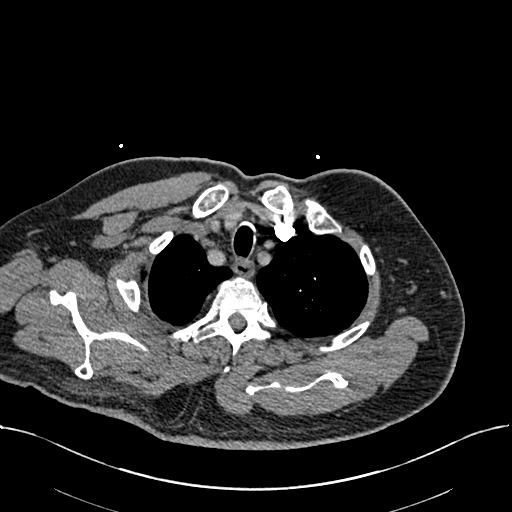
[im 262/302  lung]
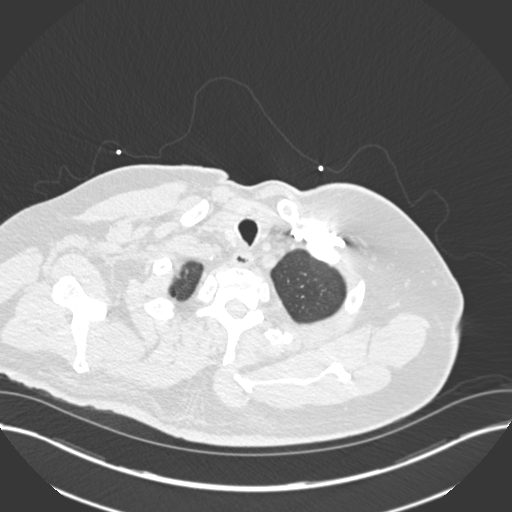
[im 288/302  soft-tissue]
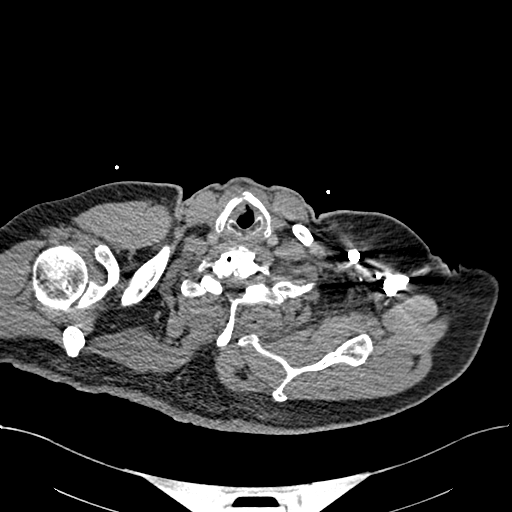

[Series 7: coronal mpr · coronal · 0.61mm/px · 2 of 97 slices shown]
[im 33/97  soft-tissue]
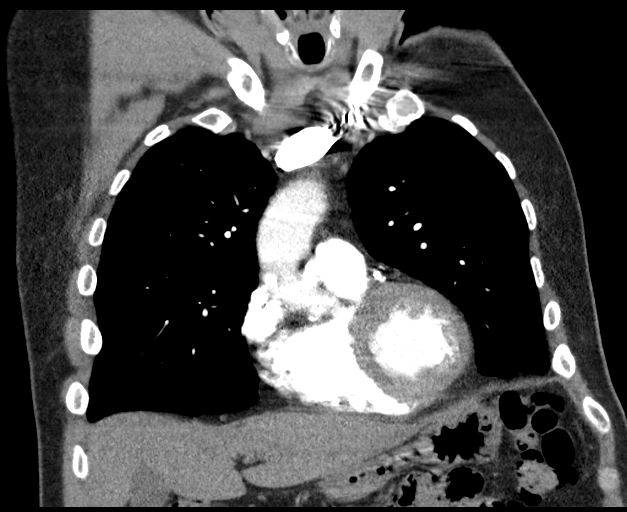
[im 65/97  soft-tissue]
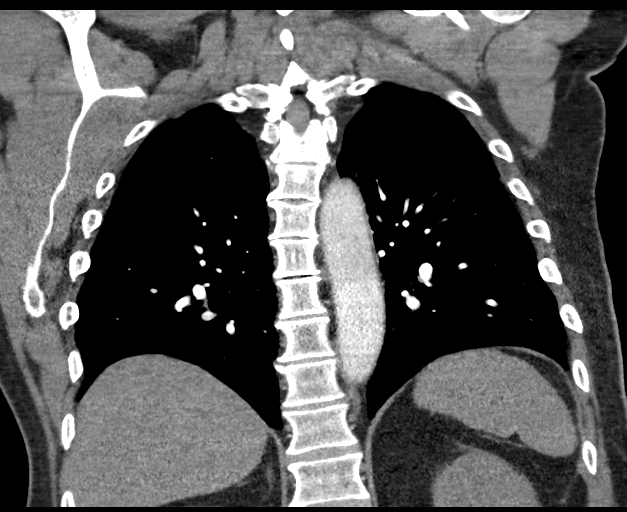

[18 of 46 positions shown; findings below may reference images not displayed]

FINDINGS: Cardiovascular:

Heart:

No cardiomegaly. No pericardial fluid/thickening. Calcifications/
stenting of the left anterior descending coronary artery.
Calcifications of circumflex coronary artery and right coronary
artery.

Aorta:

Unremarkable course, caliber, contour of the thoracic aorta. No
aneurysm or dissection flap. No periaortic fluid.

Pulmonary arteries:

No central, lobar, segmental, or proximal subsegmental filling
defects.

Mediastinum/Nodes: Mediastinal lymph nodes are present, none of
which are enlarged by CT size criteria. Unremarkable appearance of
the thoracic esophagus.

Unremarkable appearance of the thoracic inlet and thyroid.

Lungs/Pleura: Central airways are clear. No pleural effusion. No
confluent left-sided airspace disease.

No pneumothorax.

Upper Abdomen: Unremarkable.

Musculoskeletal: No displaced fracture. Degenerative changes of the
spine. Pectus deformity.

Review of the MIP images confirms the above findings.
IMPRESSION: CT is negative for pulmonary emboli.  No acute finding identified.

Evidence of prior PCI and coronary artery disease.

## 2018-05-08 ENCOUNTER — Ambulatory Visit: Payer: Medicare Other

## 2018-05-08 ENCOUNTER — Encounter: Payer: Medicare Other | Admitting: Family Medicine

## 2018-05-10 ENCOUNTER — Telehealth: Payer: Self-pay | Admitting: *Deleted

## 2018-05-10 NOTE — Telephone Encounter (Signed)

## 2018-05-18 ENCOUNTER — Other Ambulatory Visit: Payer: Self-pay

## 2018-05-18 ENCOUNTER — Telehealth (INDEPENDENT_AMBULATORY_CARE_PROVIDER_SITE_OTHER): Payer: Medicare Other | Admitting: Internal Medicine

## 2018-05-18 VITALS — BP 130/80 | HR 55 | Ht 70.0 in | Wt 196.0 lb

## 2018-05-18 DIAGNOSIS — I25118 Atherosclerotic heart disease of native coronary artery with other forms of angina pectoris: Secondary | ICD-10-CM

## 2018-05-18 DIAGNOSIS — I5022 Chronic systolic (congestive) heart failure: Secondary | ICD-10-CM | POA: Diagnosis not present

## 2018-05-18 DIAGNOSIS — E785 Hyperlipidemia, unspecified: Secondary | ICD-10-CM

## 2018-05-18 DIAGNOSIS — I255 Ischemic cardiomyopathy: Secondary | ICD-10-CM | POA: Diagnosis not present

## 2018-05-18 DIAGNOSIS — Z7189 Other specified counseling: Secondary | ICD-10-CM

## 2018-05-18 MED ORDER — CARVEDILOL 3.125 MG PO TABS: 3.1250 mg | ORAL_TABLET | Freq: Two times a day (BID) | ORAL | 3 refills | Status: DC

## 2018-05-18 MED ORDER — RANOLAZINE ER 500 MG PO TB12: 500.0000 mg | ORAL_TABLET | Freq: Two times a day (BID) | ORAL | 3 refills | Status: DC

## 2018-05-18 MED ORDER — CLOPIDOGREL BISULFATE 75 MG PO TABS: 75.0000 mg | ORAL_TABLET | Freq: Every day | ORAL | 3 refills | Status: DC

## 2018-05-18 MED ORDER — ATORVASTATIN CALCIUM 80 MG PO TABS: 80.0000 mg | ORAL_TABLET | Freq: Every day | ORAL | 3 refills | Status: DC

## 2018-05-18 MED ORDER — SPIRONOLACTONE 25 MG PO TABS: 12.5000 mg | ORAL_TABLET | Freq: Every day | ORAL | 3 refills | Status: DC

## 2018-05-18 NOTE — Patient Instructions (Signed)
Medication Instructions:  Your physician recommends that you continue on your current medications as directed. Please refer to the Current Medication list given to you today.  Your cardiac medications have been refilled today  If you need a refill on your cardiac medications before your next appointment, please call your pharmacy.   Lab work: Your physician recommends that you return for lab work in: 4 months (BMP)  If you have labs (blood work) drawn today and your tests are completely normal, you will receive your results only by: Marland Kitchen MyChart Message (if you have MyChart) OR . A paper copy in the mail If you have any lab test that is abnormal or we need to change your treatment, we will call you to review the results.  Testing/Procedures: None ordered  Follow-Up: At [facility], you and your health needs are our priority.  As part of our continuing mission to provide you with exceptional heart care, we have created designated Provider Care Teams.  These Care Teams include your primary Cardiologist (physician) and Advanced Practice Providers (APPs -  Physician Assistants and Nurse Practitioners) who all work together to provide you with the care you need, when you need it. You will need a follow up appointment in 4 months.  Please call our office 2 months in advance to schedule this appointment.  You may see Nelva Bush, MD or one of the following Advanced Practice Providers on your designated Care Team:   Murray Hodgkins, NP Christell Faith, PA-C . Marrianne Mood, PA-C

## 2018-05-18 NOTE — Progress Notes (Signed)
Virtual Visit via Video Note   This visit type was conducted due to national recommendations for restrictions regarding the COVID-19 Pandemic (e.g. social distancing) in an effort to limit this patient's exposure and mitigate transmission in our community.  Due to his co-morbid illnesses, this patient is at least at moderate risk for complications without adequate follow up.  This format is felt to be most appropriate for this patient at this time.  All issues noted in this document were discussed and addressed.  A limited physical exam was performed with this format.  Please refer to the patient's chart for his consent to telehealth for [facility].   Evaluation Performed:  Follow-up visit  Date:  05/18/2018   ID:  Edward Heir., DOB Oct 22, 1950, MRN [id number]  Patient Location: Home Provider Location: Office  PCP:  Margo Common, PA  Cardiologist:  Nelva Bush, MD  Electrophysiologist:  None   Chief Complaint: Follow-up coronary artery disease and chronic systolic heart failure  History of Present Illness:    Edward Wile. is a 68 y.o. male with history of coronary artery disease status post primary PCI to the LAD in setting of anterior STEMI, chronic systolic heart failure secondary to ischemic cardiomyopathy complicated by apical thrombus, hypertension, and hyperlipidemia.  We are speaking today for follow-up of his coronary artery disease and cardiomyopathy.  I last saw Edward [name] and 11/2017, at which time he was feeling well with resolution of chest pain after addition of ranolazine.  Previous cardiac catheterization in 09/2017 showed patent LAD stent with otherwise stable noncritical CAD.  We did not make any medication changes at that time.  Today, Edward [name] reports he has been feeling relatively well.  He still has occasional heartburn, though it is infrequent and not nearly as severe as what he experienced in the past.  His chest pain overall has been well controlled  with ranolazine 500 mg twice daily.  He brings up the possibility of discontinuing this, as he has been feeling well.  He denies shortness of breath, palpitations, orthopnea, and edema.  He has experienced occasional orthostatic lightheadedness when standing up quickly.  He has been trying to walk more, and is now regularly going 8000-10,000 steps per day.  He is tolerating his medications well without side effects.  He has not had any bleeding, remaining on DAPT with aspirin and clopidogrel.  The patient does not have symptoms concerning for COVID-19 infection (fever, chills, cough, or new shortness of breath).    Past Medical History:  Diagnosis Date   Anxiety    Apical mural thrombus    a. s/p 3 months of anticoagulation   BPH (benign prostatic hyperplasia)    Coronary artery disease    a. 03/2016: LM nl, LAD thrombotic occlusion (3.0x38 Resolute Integrity DES), D1 70 (jailed), mild to mod nonobs LCx & RCA dzs; b. MV 7/18: Mod mid/apical antsept defect, no isch, EF 45%->Med Rx; c. 09/2017 Cath: LM nl, LAD patent stent, D1 70ost (jailed), LCX 50p, OM1 100, OM2 30, RCA 30p, RPAV 50, EF 50-55%->Med Rx.   HFrEF (heart failure with reduced ejection fraction) ([place])    a. TTE 2/18:  EF of 30-35% (in setting of MI); b. TTE 5/18: EF 35-40%; c. 10/2017 Echo: EF 40-45%, mid-apicalantsept, ant, ap sev HK, Gr1 DD. Mild BAE.   Hyperlipidemia    Hypertension    Ischemic cardiomyopathy    a. TTE 2/18:  EF of 30-35% with LAD territory AK, likely  mural thrombus at the LV apex; b. TTE 5/18: EF 35-40%, mid and apical anterior/anteroseptal AK, no evidence of LV thrombus, Gr1DD, mild BAE, normal RVSF; c. 10/2017 Echo: EF 40-45%, mid-apicalantsept, ant, ap sev HK, Gr1 DD. Mild BAE.   Myocardial infarction [facility])    Past Surgical History:  Procedure Laterality Date   COLONOSCOPY WITH PROPOFOL N/A 04/22/2017   Procedure: COLONOSCOPY WITH PROPOFOL;  Surgeon: Jonathon Bellows, MD;  Location: [facility] ENDOSCOPY;  Service:  Gastroenterology;  Laterality: N/A;   CORONARY ANGIOGRAPHY N/A 03/09/2016   Procedure: Coronary Angiography;  Surgeon: Nelva Bush, MD;  Location: [place] CV LAB;  Service: Cardiovascular;  Laterality: N/A;   CORONARY STENT INTERVENTION N/A 03/09/2016   Procedure: Coronary Stent Intervention;  Surgeon: Nelva Bush, MD;  Location: [place] CV LAB;  Service: Cardiovascular;  Laterality: N/A;   HERNIA REPAIR  2005   HYDROCELE EXCISION     LEFT HEART CATH AND CORONARY ANGIOGRAPHY Left 09/20/2017   Procedure: LEFT HEART CATH AND CORONARY ANGIOGRAPHY;  Surgeon: Nelva Bush, MD;  Location: [place] CV LAB;  Service: Cardiovascular;  Laterality: Left;     Current Meds  Medication Sig   aspirin EC 81 MG tablet Take 1 tablet (81 mg total) by mouth daily.   atorvastatin (LIPITOR) 80 MG tablet Take 1 tablet (80 mg total) by mouth daily for 7 days.   carvedilol (COREG) 3.125 MG tablet Take 1 tablet (3.125 mg total) by mouth 2 (two) times daily.   clopidogrel (PLAVIX) 75 MG tablet Take 1 tablet (75 mg total) by mouth daily.   LORazepam (ATIVAN) 0.5 MG tablet Take 1 tablet (0.5 mg total) by mouth every 8 (eight) hours as needed for anxiety.   losartan (COZAAR) 25 MG tablet Take 1 tablet (25 mg total) by mouth daily.   nitroGLYCERIN (NITROSTAT) 0.4 MG SL tablet Place 1 tablet (0.4 mg total) under the tongue every 5 (five) minutes as needed for chest pain. Maximum of 3 doses.   ranolazine (RANEXA) 500 MG 12 hr tablet TAKE 1 TABLET BY MOUTH TWICE A DAY   spironolactone (ALDACTONE) 25 MG tablet Take 0.5 tablets (12.5 mg total) by mouth daily for 30 days.     Allergies:   Patient has no known allergies.   Social History   Tobacco Use   Smoking status: Former Smoker    Start date: 07/03/2007   Smokeless tobacco: Never Used   Tobacco comment: QUIT IN 2005  Substance Use Topics   Alcohol use: Yes    Alcohol/week: 7.0 standard drinks    Types: 7 Cans of beer per  week   Drug use: Yes    Types: Marijuana    Comment: smokes pot     Family Hx: The patient's family history includes Emphysema in his mother; Heart attack in his father and mother; Macular degeneration in his mother.  ROS:   Please see the history of present illness.   All other systems reviewed and are negative.   Prior CV studies:   The following studies were reviewed today:  TTE (10/13/2017): Technically difficult study.  Normal LV size with mild LVH.  LVEF 40 to 45% with severe hypokinesis of the mid and apical anterior/anteroseptal walls as well as the apex.  Grade 1 diastolic dysfunction.  Mild biatrial enlargement.  Carotid Doppler (10/13/2017): Mild bilateral ICA (less than 40%) stenoses.  Antegrade flow in both vertebral arteries.  Normal subclavian hemodynamics.  LHC (09/20/2017): LMCA normal.  LAD with patent proximal/mid vessel stent.  D1  with 70% ostial stenosis.  LCx with 50% proximal stenosis.  Chronic total occlusion of small OM1 branch noted.  30% proximal OM 2 stenosis noted.  RCA with minimal luminal irregularities and 50% stenosis in proximal RPL.  LVEDP 10 to 15 mmHg.  LVEF 50 to 55% with apical inferior hypokinesis.  Labs/Other Tests and Data Reviewed:    EKG:  No ECG reviewed.  Recent Labs: 02/24/2018: ALT 17; BUN 10; Creatinine, Ser 0.95; Hemoglobin 14.2; Platelets 210; Potassium 4.5; Sodium 141   Recent Lipid Panel Lab Results  Component Value Date/Time   CHOL 105 02/24/2018 09:40 AM   TRIG 76 02/24/2018 09:40 AM   HDL 47 02/24/2018 09:40 AM   CHOLHDL 2.2 02/24/2018 09:40 AM   CHOLHDL 4.0 03/09/2016 05:08 PM   LDLCALC 43 02/24/2018 09:40 AM    Wt Readings from Last 3 Encounters:  05/18/18 196 lb (88.9 kg)  02/24/18 209 lb 12.8 oz (95.2 kg)  11/25/17 202 lb (91.6 kg)     Objective:    Vital Signs:  BP 130/80 (BP Location: Left Arm, Patient Position: Sitting, Cuff Size: Normal)    Pulse (!) 55    Ht 5\' 10"  (1.778 m)    Wt 196 lb (88.9 kg)    BMI  28.12 kg/m    Well nourished, well developed male in no acute distress.   ASSESSMENT & PLAN:    Coronary artery disease with stable angina: Overall, chest pain is well controlled with only rare episodes of atypical discomfort suggestive of GERD.  We discussed the risks and benefits of discontinuing ranolazine and have agreed to continue antianginal therapy with ranolazine 500 mg twice daily and carvedilol 3.125 mg twice daily.  We have also agreed to continue indefinite dual antiplatelet therapy with aspirin and clopidogrel, which Edward [name] is tolerating well.  Chronic systolic heart failure due to ischemic cardiomyopathy: Edward [name] does not report any signs or symptoms of worsening heart failure.  His weight is stable, and he endorses NYHA class I-II symptoms.  Baseline bradycardia precludes escalation of carvedilol.  Given intermittent orthostatic lightheadedness and low normal blood pressures in the past, I am also reluctant to increase losartan or spironolactone today.  We will continue current doses and plan to repeat a BMP when Edward [name] returns to see me.  Hyperlipidemia: LDL well controlled and below goal of 70.  We will continue atorvastatin 80 mg daily.  COVID-19 Education: The signs and symptoms of COVID-19 were discussed with the patient and how to seek care for testing (follow up with PCP or arrange E-visit).  The importance of social distancing was discussed today.  Time:   Today, I have spent 15 minutes with the patient with telehealth technology discussing the above problems.     Medication Adjustments/Labs and Tests Ordered: Current medicines are reviewed at length with the patient today.  Concerns regarding medicines are outlined above.   Tests Ordered: BMP when patient returns in 4 months.  Medication Changes: None.  Disposition:  Follow up in 4 month(s)  Signed, Nelva Bush, MD  05/18/2018 10:11 AM    [facility]

## 2018-07-20 ENCOUNTER — Telehealth: Payer: Self-pay | Admitting: Internal Medicine

## 2018-07-20 NOTE — Telephone Encounter (Signed)
Noted- to Dr. Darnelle Bos nurse as an Juluis Rainier.  He was coming due for a BMP to be done.

## 2018-07-20 NOTE — Telephone Encounter (Signed)
Patient has appt tomorrow with pcp .  Wants labs done there at same time.   He will remind tech at check in tomorrow as well

## 2018-07-21 ENCOUNTER — Encounter: Payer: Self-pay | Admitting: Family Medicine

## 2018-07-21 ENCOUNTER — Ambulatory Visit: Payer: Medicare Other

## 2018-07-21 ENCOUNTER — Ambulatory Visit (INDEPENDENT_AMBULATORY_CARE_PROVIDER_SITE_OTHER): Payer: Medicare Other | Admitting: Family Medicine

## 2018-07-21 ENCOUNTER — Other Ambulatory Visit: Payer: Self-pay

## 2018-07-21 VITALS — BP 102/68 | HR 58 | Temp 99.1°F | Resp 16 | Wt 201.6 lb

## 2018-07-21 DIAGNOSIS — F419 Anxiety disorder, unspecified: Secondary | ICD-10-CM

## 2018-07-21 DIAGNOSIS — E785 Hyperlipidemia, unspecified: Secondary | ICD-10-CM | POA: Diagnosis not present

## 2018-07-21 DIAGNOSIS — R351 Nocturia: Secondary | ICD-10-CM | POA: Diagnosis not present

## 2018-07-21 DIAGNOSIS — Z23 Encounter for immunization: Secondary | ICD-10-CM | POA: Diagnosis not present

## 2018-07-21 DIAGNOSIS — I10 Essential (primary) hypertension: Secondary | ICD-10-CM

## 2018-07-21 DIAGNOSIS — I5022 Chronic systolic (congestive) heart failure: Secondary | ICD-10-CM

## 2018-07-21 DIAGNOSIS — I25119 Atherosclerotic heart disease of native coronary artery with unspecified angina pectoris: Secondary | ICD-10-CM | POA: Diagnosis not present

## 2018-07-21 MED ORDER — LORAZEPAM 0.5 MG PO TABS: 0.5000 mg | ORAL_TABLET | Freq: Three times a day (TID) | ORAL | 0 refills | Status: DC | PRN

## 2018-07-21 NOTE — Telephone Encounter (Signed)
It appears patient is at PCP and they have ordered routine lab work.

## 2018-07-21 NOTE — Progress Notes (Signed)
Patient: Edward Lapinsky., Male    DOB: 1950-10-11, 68 y.o.   MRN: [id number] Visit Date: 07/21/2018  Today's Provider: Vernie Murders, PA   Chief Complaint  Patient presents with  . Annual Exam   Subjective:    Annual physical exam Edward Biehn. is a 68 y.o. male who presents today for health maintenance and complete physical. He feels fairly well. He reports exercising walking 8-10k steps a day. He reports he is sleeping well.  -----------------------------------------------------------------   Review of Systems  Constitutional: Negative.   HENT: Positive for dental problem and tinnitus.   Respiratory: Negative.   Cardiovascular: Negative.   Gastrointestinal: Negative.   Endocrine: Positive for polyuria.  Musculoskeletal: Negative.   Allergic/Immunologic: Negative.   Neurological: Negative.   Hematological: Negative.   Psychiatric/Behavioral: Negative.     Social History He  reports that he has quit smoking. He started smoking about 11 years ago. He has never used smokeless tobacco. He reports current alcohol use of about 7.0 standard drinks of alcohol per week. He reports current drug use. Drug: Marijuana. Social History   Socioeconomic History  . Marital status: Married    Spouse name: Not on file  . Number of children: Not on file  . Years of education: Not on file  . Highest education level: Not on file  Occupational History  . Not on file  Social Needs  . Financial resource strain: Not on file  . Food insecurity    Worry: Not on file    Inability: Not on file  . Transportation needs    Medical: Not on file    Non-medical: Not on file  Tobacco Use  . Smoking status: Former Smoker    Start date: 07/03/2007  . Smokeless tobacco: Never Used  . Tobacco comment: QUIT IN 2005  Substance and Sexual Activity  . Alcohol use: Yes    Alcohol/week: 7.0 standard drinks    Types: 7 Cans of beer per week  . Drug use: Yes    Types: Marijuana    Comment:  smokes pot  . Sexual activity: Not on file  Lifestyle  . Physical activity    Days per week: Not on file    Minutes per session: Not on file  . Stress: Not on file  Relationships  . Social Herbalist on phone: Not on file    Gets together: Not on file    Attends religious service: Not on file    Active member of club or organization: Not on file    Attends meetings of clubs or organizations: Not on file    Relationship status: Not on file  Other Topics Concern  . Not on file  Social History Narrative   Living with family, Independent at baseline    Patient Active Problem List   Diagnosis Date Noted  . Gastroesophageal reflux disease 09/26/2017  . Coronary artery disease of native artery of native heart with stable angina pectoris ([place]) 09/19/2017  . Atypical chest pain 08/27/2016  . Chronic systolic heart failure ([place]) 04/07/2016  . Coronary artery disease involving native coronary artery of native heart with angina pectoris ([place]) 03/25/2016  . Apical mural thrombus following MI ([place]) 03/22/2016  . Encounter for therapeutic drug monitoring 03/22/2016  . Clostridium difficile diarrhea   . Ischemic cardiomyopathy   . Smoker   . Hyperlipidemia LDL goal <70   . Encounter for anticoagulation discussion and counseling   .  Anxiety 07/30/2014  . Arthritis of neck 07/30/2014  . ED (erectile dysfunction) of organic origin 07/30/2014  . Personal history of tobacco use, presenting hazards to health 07/30/2014  . Adiposity 07/30/2014  . Delayed onset of urination 07/30/2014  . Brachial neuritis 08/26/2009  . Actinic keratoses 07/30/2008  . Essential (primary) hypertension 10/29/2005    Past Surgical History:  Procedure Laterality Date  . COLONOSCOPY WITH PROPOFOL N/A 04/22/2017   Procedure: COLONOSCOPY WITH PROPOFOL;  Surgeon: Jonathon Bellows, MD;  Location: [facility] ENDOSCOPY;  Service: Gastroenterology;  Laterality: N/A;  . CORONARY ANGIOGRAPHY N/A 03/09/2016   Procedure:  Coronary Angiography;  Surgeon: Nelva Bush, MD;  Location: [place] CV LAB;  Service: Cardiovascular;  Laterality: N/A;  . CORONARY STENT INTERVENTION N/A 03/09/2016   Procedure: Coronary Stent Intervention;  Surgeon: Nelva Bush, MD;  Location: [place] CV LAB;  Service: Cardiovascular;  Laterality: N/A;  . HERNIA REPAIR  2005  . HYDROCELE EXCISION    . LEFT HEART CATH AND CORONARY ANGIOGRAPHY Left 09/20/2017   Procedure: LEFT HEART CATH AND CORONARY ANGIOGRAPHY;  Surgeon: Nelva Bush, MD;  Location: [place] CV LAB;  Service: Cardiovascular;  Laterality: Left;    Family History  Family Status  Relation Name Status  . Mother  Alive  . Father  Deceased at age 4  . MGM  Deceased  . MGF  Deceased  . PGM  Deceased  . PGF  Deceased   His family history includes Emphysema in his mother; Heart attack in his father and mother; Macular degeneration in his mother.     No Known Allergies  Previous Medications   ASPIRIN EC 81 MG TABLET    Take 1 tablet (81 mg total) by mouth daily.   ATORVASTATIN (LIPITOR) 80 MG TABLET    Take 1 tablet (80 mg total) by mouth daily.   CARVEDILOL (COREG) 3.125 MG TABLET    Take 1 tablet (3.125 mg total) by mouth 2 (two) times daily.   CLOPIDOGREL (PLAVIX) 75 MG TABLET    Take 1 tablet (75 mg total) by mouth daily.   LORAZEPAM (ATIVAN) 0.5 MG TABLET    Take 1 tablet (0.5 mg total) by mouth every 8 (eight) hours as needed for anxiety.   LOSARTAN (COZAAR) 25 MG TABLET    Take 1 tablet (25 mg total) by mouth daily.   NITROGLYCERIN (NITROSTAT) 0.4 MG SL TABLET    Place 1 tablet (0.4 mg total) under the tongue every 5 (five) minutes as needed for chest pain. Maximum of 3 doses.   RANOLAZINE (RANEXA) 500 MG 12 HR TABLET    Take 1 tablet (500 mg total) by mouth 2 (two) times daily.   SPIRONOLACTONE (ALDACTONE) 25 MG TABLET    Take 0.5 tablets (12.5 mg total) by mouth daily.    Patient Care Team: , Vickki Muff, PA as PCP - General  (Physician Assistant) End, Harrell Gave, MD as PCP - Cardiology (Cardiology) Edrick Kins, MD as Rounding Team (Internal Medicine) End, Harrell Gave, MD as Consulting Physician (Cardiology)      Objective:   Vitals: BP 102/68   Pulse (!) 58   Temp 99.1 F (37.3 C) (Oral)   Resp 16   Wt 201 lb 9.6 oz (91.4 kg)   SpO2 96%   BMI 28.93 kg/m   Wt Readings from Last 3 Encounters:  07/21/18 201 lb 9.6 oz (91.4 kg)  05/18/18 196 lb (88.9 kg)  02/24/18 209 lb 12.8 oz (95.2 kg)   Physical Exam Vitals signs  and nursing note reviewed.  Constitutional:      Appearance: He is well-developed.  HENT:     Head: Normocephalic and atraumatic.     Right Ear: External ear normal.     Left Ear: External ear normal.     Nose: Nose normal.  Eyes:     General:        Right eye: No discharge.     Conjunctiva/sclera: Conjunctivae normal.     Pupils: Pupils are equal, round, and reactive to light.  Neck:     Musculoskeletal: Normal range of motion and neck supple.     Thyroid: No thyromegaly.     Trachea: No tracheal deviation.  Cardiovascular:     Rate and Rhythm: Normal rate and regular rhythm.     Heart sounds: Normal heart sounds. No murmur.  Pulmonary:     Effort: Pulmonary effort is normal. No respiratory distress.     Breath sounds: Normal breath sounds. No wheezing or rales.  Chest:     Chest wall: No tenderness.  Abdominal:     General: There is no distension.     Palpations: Abdomen is soft. There is no mass.     Tenderness: There is no abdominal tenderness. There is no guarding or rebound.  Genitourinary:    Penis: Normal.      Scrotum/Testes: Normal.     Prostate: Normal.     Rectum: Normal. Guaiac result negative.  Musculoskeletal: Normal range of motion.        General: No tenderness.  Lymphadenopathy:     Cervical: No cervical adenopathy.  Skin:    General: Skin is warm and dry.     Findings: No erythema or rash.  Neurological:     Mental Status: He is alert  and oriented to person, place, and time.     Cranial Nerves: No cranial nerve deficit.     Motor: No abnormal muscle tone.     Coordination: Coordination normal.     Deep Tendon Reflexes: Reflexes are normal and symmetric. Reflexes normal.  Psychiatric:        Behavior: Behavior normal.        Thought Content: Thought content normal.        Judgment: Judgment normal.    Depression Screen PHQ 2/9 Scores 07/21/2018 03/25/2017 09/09/2016 06/21/2016  PHQ - 2 Score 0 1 1 0  PHQ- 9 Score 0 1 3 2     Assessment & Plan:     Routine Health Maintenance and Physical Exam  Exercise Activities and Dietary recommendations Goals   Encouraged to exercise 30-40 minutes 3-4 days a week and get back on low fat diet with less beer intake.     Immunization History  Administered Date(s) Administered  . Pneumococcal Conjugate-13 03/25/2017  . Tdap 11/23/2012    Health Maintenance  Topic Date Due  . PNA vac Low Risk Adult (2 of 2 - PPSV23) 03/25/2018  . COLON CANCER SCREENING ANNUAL FOBT  04/23/2018  . INFLUENZA VACCINE  09/02/2018  . COLONOSCOPY  04/23/2022  . TETANUS/TDAP  11/24/2022  . Hepatitis C Screening  Completed    Discussed health benefits of physical activity, and encouraged him to engage in regular exercise appropriate for his age and condition.   1. Essential (primary) hypertension Well controlled on the Losartan 25 mg qd, Carvedilol 3.125 mg BID and Spironolactone 12.5 mg qd. General exam stable. Needs pneumonia vaccination and routine follow up labs with history of CAD. Given anticipatory counseling and encouraged COVID  restrictions. Recheck pending lab reports. - CBC with Differential/Platelet - Comprehensive metabolic panel - TSH  2. Hyperlipidemia LDL goal <70 Well controlled per labs 4 months ago by Dr. Saunders Revel. Will continue Atorvastatin 80 mg qd and low fat diet. Recheck CMP and Lipid Panel. - Comprehensive metabolic panel - Lipid panel  3. Coronary artery disease  involving native coronary artery of native heart with angina pectoris ([place])  History of coronary artery disease status post primary PCI to the LAD in setting of anterior STEMI, chronic systolic heart failure secondary to ischemic cardiomyopathy complicated by apical thrombus, hypertension, and hyperlipidemia (2018). Presently on Ranolazine 500 mg BID with Carvedilol 3.125 mg BID with indefinite dual antiplatelet therapy of ASA with Clopidogrel. Tolerating this regimen well. Continues regular follow up with cardiologist.   4. Chronic systolic heart failure ([place]) Due to ischemic cardiomyopathy and considered a well-compensated NYHA class I-II by his cardiologist (Dr. Saunders Revel). Controlled without significant edema or weight gain on Losartan, Spironolactone and Carvedilol. Recheck follow up labs.   - CBC with Differential/Platelet - Comprehensive metabolic panel - TSH  5. Anxiety Some intermittent stress after 4 year old mother died on 07/24/18 from CHF with arrhythmia requiring a pacemaker. 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 Requests refill of Ativan to use prn. No suicidal ideation or panic attacks. - LORazepam (ATIVAN) 0.5 MG tablet; Take 1 tablet (0.5 mg total) by mouth every 8 (eight) hours as needed for anxiety.  Dispense: 30 tablet; Refill: 0  6. Nocturia Some decrease in stream and nocturia 1-2 times a night. No urinary retention. DRE essentially normal without nodules. Will check PSA and follow up prn. - PSA  7. Need for Streptococcus pneumoniae vaccination - Pneumococcal polysaccharide vaccine 23-valent greater than or equal to 2yo subcutaneous/IM  -------------------------------------------------------------------- [employer] as a scribe for [employer], [employer].,have documented all relevant documentation on the behalf of [employer], PA,as directed by  [employer], PA  while in the presence of [employer], [place].

## 2018-07-27 DIAGNOSIS — I10 Essential (primary) hypertension: Secondary | ICD-10-CM | POA: Diagnosis not present

## 2018-07-27 DIAGNOSIS — R351 Nocturia: Secondary | ICD-10-CM | POA: Diagnosis not present

## 2018-07-27 DIAGNOSIS — E785 Hyperlipidemia, unspecified: Secondary | ICD-10-CM | POA: Diagnosis not present

## 2018-07-27 DIAGNOSIS — I5022 Chronic systolic (congestive) heart failure: Secondary | ICD-10-CM | POA: Diagnosis not present

## 2018-07-27 DIAGNOSIS — R39198 Other difficulties with micturition: Secondary | ICD-10-CM | POA: Diagnosis not present

## 2018-07-28 LAB — CBC WITH DIFFERENTIAL/PLATELET
Basophils Absolute: 0 10*3/uL (ref 0.0–0.2)
Basos: 1 %
EOS (ABSOLUTE): 0.1 10*3/uL (ref 0.0–0.4)
Eos: 2 %
Hematocrit: 42.3 % (ref 37.5–51.0)
Hemoglobin: 14.4 g/dL (ref 13.0–17.7)
Immature Grans (Abs): 0 10*3/uL (ref 0.0–0.1)
Immature Granulocytes: 0 %
Lymphocytes Absolute: 1.1 10*3/uL (ref 0.7–3.1)
Lymphs: 21 %
MCH: 33 pg (ref 26.6–33.0)
MCHC: 34 g/dL (ref 31.5–35.7)
MCV: 97 fL (ref 79–97)
Monocytes Absolute: 0.4 10*3/uL (ref 0.1–0.9)
Monocytes: 7 %
Neutrophils Absolute: 3.8 10*3/uL (ref 1.4–7.0)
Neutrophils: 69 %
Platelets: 209 10*3/uL (ref 150–450)
RBC: 4.37 x10E6/uL (ref 4.14–5.80)
RDW: 12.2 % (ref 11.6–15.4)
WBC: 5.4 10*3/uL (ref 3.4–10.8)

## 2018-07-28 LAB — COMPREHENSIVE METABOLIC PANEL
ALT: 13 IU/L (ref 0–44)
AST: 20 IU/L (ref 0–40)
Albumin/Globulin Ratio: 2.2 (ref 1.2–2.2)
Albumin: 4.3 g/dL (ref 3.8–4.8)
Alkaline Phosphatase: 47 IU/L (ref 39–117)
BUN/Creatinine Ratio: 19 (ref 10–24)
BUN: 17 mg/dL (ref 8–27)
Bilirubin Total: 0.9 mg/dL (ref 0.0–1.2)
CO2: 22 mmol/L (ref 20–29)
Calcium: 9.3 mg/dL (ref 8.6–10.2)
Chloride: 104 mmol/L (ref 96–106)
Creatinine, Ser: 0.89 mg/dL (ref 0.76–1.27)
GFR calc Af Amer: 103 mL/min/{1.73_m2} (ref >59)
GFR calc non Af Amer: 89 mL/min/{1.73_m2} (ref >59)
Globulin, Total: 2 g/dL (ref 1.5–4.5)
Glucose: 101 mg/dL — ABNORMAL HIGH (ref 65–99)
Potassium: 4.2 mmol/L (ref 3.5–5.2)
Sodium: 140 mmol/L (ref 134–144)
Total Protein: 6.3 g/dL (ref 6.0–8.5)

## 2018-07-28 LAB — PSA: Prostate Specific Ag, Serum: 0.5 ng/mL (ref 0.0–4.0)

## 2018-07-28 LAB — LIPID PANEL
Chol/HDL Ratio: 2 ratio (ref 0.0–5.0)
Cholesterol, Total: 111 mg/dL (ref 100–199)
HDL: 55 mg/dL (ref >39)
LDL Calculated: 44 mg/dL (ref 0–99)
Triglycerides: 59 mg/dL (ref 0–149)
VLDL Cholesterol Cal: 12 mg/dL (ref 5–40)

## 2018-07-28 LAB — TSH: TSH: 1.62 u[IU]/mL (ref 0.450–4.500)

## 2018-09-16 DIAGNOSIS — Z20828 Contact with and (suspected) exposure to other viral communicable diseases: Secondary | ICD-10-CM | POA: Diagnosis not present

## 2018-09-16 DIAGNOSIS — Z6826 Body mass index (BMI) 26.0-26.9, adult: Secondary | ICD-10-CM | POA: Diagnosis not present

## 2018-10-04 NOTE — Progress Notes (Addendum)
Subjective:   Edward [name]. is a 68 y.o. male who presents for Medicare Annual/Subsequent preventive examination.    This visit is being conducted through telemedicine due to the COVID-19 pandemic. This patient has given me verbal consent via doximity to conduct this visit, patient states they are participating from their home address. Some vital signs may be absent or patient reported.    Patient identification: identified by name, DOB, and current address  Review of Systems:  N/A  Cardiac Risk Factors include: advanced age (>67men, >61 women);dyslipidemia;hypertension;male gender     Objective:    Vitals: There were no vitals taken for this visit.  There is no height or weight on file to calculate BMI. Unable to obtain vitals due to visit being conducted via telephonically.   Advanced Directives 10/05/2018 09/20/2017 09/15/2017 03/13/2017 08/27/2016 08/27/2016 06/21/2016  Does Patient Have a Medical Advance Directive? No No No No No No No  Would patient like information on creating a medical advance directive? - No - Patient declined No - Patient declined No - Patient declined No - Patient declined No - Patient declined No - Patient declined    Tobacco Social History   Tobacco Use  Smoking Status Former Smoker  . Start date: 07/03/2007  Smokeless Tobacco Never Used  Tobacco Comment   QUIT IN 2005     Counseling given: Not Answered Comment: QUIT IN 2005   Clinical Intake:  Pre-visit preparation completed: Yes  Pain : No/denies pain Pain Score: 0-No pain     Nutritional Risks: None Diabetes: No  How often do you need to have someone help you when you read instructions, pamphlets, or other written materials from your doctor or pharmacy?: 1 - Never  Interpreter Needed?: No  Information entered by :: [facility], LPN  Past Medical History:  Diagnosis Date  . Anxiety   . Apical mural thrombus    a. s/p 3 months of anticoagulation  . BPH (benign prostatic  hyperplasia)   . Coronary artery disease    a. 03/2016: LM nl, LAD thrombotic occlusion (3.0x38 Resolute Integrity DES), D1 70 (jailed), mild to mod nonobs LCx & RCA dzs; b. MV 7/18: Mod mid/apical antsept defect, no isch, EF 45%->Med Rx; c. 09/2017 Cath: LM nl, LAD patent stent, D1 70ost (jailed), LCX 50p, OM1 100, OM2 30, RCA 30p, RPAV 50, EF 50-55%->Med Rx.  . HFrEF (heart failure with reduced ejection fraction) ([place])    a. TTE 2/18:  EF of 30-35% (in setting of MI); b. TTE 5/18: EF 35-40%; c. 10/2017 Echo: EF 40-45%, mid-apicalantsept, ant, ap sev HK, Gr1 DD. Mild BAE.  Marland Kitchen Hyperlipidemia   . Hypertension   . Ischemic cardiomyopathy    a. TTE 2/18:  EF of 30-35% with LAD territory AK, likely mural thrombus at the LV apex; b. TTE 5/18: EF 35-40%, mid and apical anterior/anteroseptal AK, no evidence of LV thrombus, Gr1DD, mild BAE, normal RVSF; c. 10/2017 Echo: EF 40-45%, mid-apicalantsept, ant, ap sev HK, Gr1 DD. Mild BAE.  Marland Kitchen Myocardial infarction [facility])    Past Surgical History:  Procedure Laterality Date  . COLONOSCOPY WITH PROPOFOL N/A 04/22/2017   Procedure: COLONOSCOPY WITH PROPOFOL;  Surgeon: Jonathon Bellows, MD;  Location: [facility] ENDOSCOPY;  Service: Gastroenterology;  Laterality: N/A;  . CORONARY ANGIOGRAPHY N/A 03/09/2016   Procedure: Coronary Angiography;  Surgeon: Nelva Bush, MD;  Location: [place] CV LAB;  Service: Cardiovascular;  Laterality: N/A;  . CORONARY STENT INTERVENTION N/A 03/09/2016   Procedure: Coronary Stent  Intervention;  Surgeon: Nelva Bush, MD;  Location: [place] CV LAB;  Service: Cardiovascular;  Laterality: N/A;  . HERNIA REPAIR  2005  . HYDROCELE EXCISION    . LEFT HEART CATH AND CORONARY ANGIOGRAPHY Left 09/20/2017   Procedure: LEFT HEART CATH AND CORONARY ANGIOGRAPHY;  Surgeon: Nelva Bush, MD;  Location: [place] CV LAB;  Service: Cardiovascular;  Laterality: Left;   Family History  Problem Relation Age of Onset  . Emphysema Mother   .  Macular degeneration Mother   . Heart attack Mother   . Heart attack Father    Social History   Socioeconomic History  . Marital status: Married    Spouse name: Not on file  . Number of children: 0  . Years of education: Not on file  . Highest education level: Bachelor's degree (e.g., BA, AB, BS)  Occupational History  . Not on file  Social Needs  . Financial resource strain: Not hard at all  . Food insecurity    Worry: Never true    Inability: Never true  . Transportation needs    Medical: No    Non-medical: No  Tobacco Use  . Smoking status: Former Smoker    Start date: 07/03/2007  . Smokeless tobacco: Never Used  . Tobacco comment: QUIT IN 2005  Substance and Sexual Activity  . Alcohol use: Yes    Alcohol/week: 7.0 standard drinks    Types: 7 Cans of beer per week  . Drug use: Yes    Types: Marijuana    Comment: smokes pot  . Sexual activity: Not on file  Lifestyle  . Physical activity    Days per week: 0 days    Minutes per session: 0 min  . Stress: To some extent  Relationships  . Social Herbalist on phone: Patient refused    Gets together: Patient refused    Attends religious service: Patient refused    Active member of club or organization: Patient refused    Attends meetings of clubs or organizations: Patient refused    Relationship status: Patient refused  Other Topics Concern  . Not on file  Social History Narrative   Living with family, Independent at baseline    Outpatient Encounter Medications as of 10/05/2018  Medication Sig  . aspirin EC 81 MG tablet Take 1 tablet (81 mg total) by mouth daily.  Marland Kitchen atorvastatin (LIPITOR) 80 MG tablet Take 1 tablet (80 mg total) by mouth daily.  . carvedilol (COREG) 3.125 MG tablet Take 1 tablet (3.125 mg total) by mouth 2 (two) times daily.  . clopidogrel (PLAVIX) 75 MG tablet Take 1 tablet (75 mg total) by mouth daily.  Marland Kitchen LORazepam (ATIVAN) 0.5 MG tablet Take 1 tablet (0.5 mg total) by mouth every 8  (eight) hours as needed for anxiety.  Marland Kitchen losartan (COZAAR) 25 MG tablet Take 1 tablet (25 mg total) by mouth daily.  . nitroGLYCERIN (NITROSTAT) 0.4 MG SL tablet Place 1 tablet (0.4 mg total) under the tongue every 5 (five) minutes as needed for chest pain. Maximum of 3 doses.  . ranolazine (RANEXA) 500 MG 12 hr tablet Take 1 tablet (500 mg total) by mouth 2 (two) times daily.  Marland Kitchen spironolactone (ALDACTONE) 25 MG tablet Take 0.5 tablets (12.5 mg total) by mouth daily.   No facility-administered encounter medications on file as of 10/05/2018.     Activities of Daily Living In your present state of health, do you have any difficulty performing the following  activities: 10/05/2018 07/21/2018  Hearing? N N  Vision? N N  Difficulty concentrating or making decisions? N N  Walking or climbing stairs? N N  Dressing or bathing? N N  Doing errands, shopping? N N  Preparing Food and eating ? N -  Using the Toilet? N -  In the past six months, have you accidently leaked urine? N -  Do you have problems with loss of bowel control? N -  Managing your Medications? N -  Managing your Finances? N -  Housekeeping or managing your Housekeeping? N -  Some recent data might be hidden    Patient Care Team: Chrismon, Vickki Muff, PA as PCP - General (Physician Assistant) End, Harrell Gave, MD as PCP - Cardiology (Cardiology)   Assessment:   This is a routine wellness examination for [place].  Exercise Activities and Dietary recommendations Current Exercise Habits: The patient does not participate in regular exercise at present, Exercise limited by: None identified  Goals    . Exercise 3x per week (30 min per time)     Recommend to exercise for 3 days a week for at least 30 minutes at a time.        Fall Risk: Fall Risk  07/21/2018 03/25/2017 06/21/2016  Falls in the past year? 0 No No    FALL RISK PREVENTION PERTAINING TO THE HOME:  Any stairs in or around the home? Yes  If so, are there any without  handrails? No   Home free of loose throw rugs in walkways, pet beds, electrical cords, etc? Yes  Adequate lighting in your home to reduce risk of falls? Yes   ASSISTIVE DEVICES UTILIZED TO PREVENT FALLS:  Life alert? No  Use of a cane, walker or w/c? No  Grab bars in the bathroom? No  Shower chair or bench in shower? No  Elevated toilet seat or a handicapped toilet? No   TIMED UP AND GO:  Was the test performed? No .    Depression Screen PHQ 2/9 Scores 07/21/2018 03/25/2017 09/09/2016 06/21/2016  PHQ - 2 Score 0 1 1 0  PHQ- 9 Score 0 1 3 2     Cognitive Function     6CIT Screen 10/05/2018  What Year? 0 points  What month? 0 points  What time? 0 points  Count back from 20 0 points  Months in reverse 0 points  Repeat phrase 0 points  Total Score 0    Immunization History  Administered Date(s) Administered  . Pneumococcal Conjugate-13 03/25/2017  . Pneumococcal Polysaccharide-23 07/21/2018  . Tdap 11/23/2012    Qualifies for Shingles Vaccine? Yes . Due for Shingrix. Education has been provided regarding the importance of this vaccine. Pt has been advised to call insurance company to determine out of pocket expense. Advised may also receive vaccine at local pharmacy or Health Dept. Verbalized acceptance and understanding.  Tdap: Up to date  Flu Vaccine: Due for Flu vaccine. Does the patient want to receive this vaccine today?  No .   Pneumococcal Vaccine: Completed series  Screening Tests Health Maintenance  Topic Date Due  . INFLUENZA VACCINE  09/02/2018  . COLON CANCER SCREENING ANNUAL FOBT  07/21/2019  . COLONOSCOPY  04/23/2022  . TETANUS/TDAP  11/24/2022  . Hepatitis C Screening  Completed  . PNA vac Low Risk Adult  Completed   Cancer Screenings:  Colorectal Screening: Completed 04/22/17. Repeat every 5 years.  Lung Cancer Screening: (Low Dose CT Chest recommended if Age 48-80 years, 30 pack-year currently smoking  OR have quit w/in 15years.) does not qualify.    Additional Screening:  Hepatitis C Screening: Up to date  Vision Screening: Recommended annual ophthalmology exams for early detection of glaucoma and other disorders of the eye.  Dental Screening: Recommended annual dental exams for proper oral hygiene  Community Resource Referral:  CRR required this visit?  No        Plan:  I have personally reviewed and addressed the Medicare Annual Wellness questionnaire and have noted the following in the patient's chart:  A. Medical and social history B. Use of alcohol, tobacco or illicit drugs  C. Current medications and supplements D. Functional ability and status E.  Nutritional status F.  Physical activity G. Advance directives H. List of other physicians I.  Hospitalizations, surgeries, and ER visits in previous 12 months J.  [place] such as hearing and vision if needed, cognitive and depression L. Referrals and appointments   In addition, I have reviewed and discussed with patient certain preventive protocols, quality metrics, and best practice recommendations. A written personalized care plan for preventive services as well as general preventive health recommendations were provided to patient.   [place]ora Score, [place]  075-GRM Nurse Health Advisor   Nurse Notes: Due for the influenza vaccine.   Reviewed note and recommendations of Nurse Health Advisor's screening. Agree with documentation and plan.

## 2018-10-05 ENCOUNTER — Other Ambulatory Visit: Payer: Self-pay

## 2018-10-05 ENCOUNTER — Ambulatory Visit (INDEPENDENT_AMBULATORY_CARE_PROVIDER_SITE_OTHER): Payer: Medicare Other

## 2018-10-05 DIAGNOSIS — Z0000 Encounter for general adult medical examination without abnormal findings: Secondary | ICD-10-CM

## 2018-10-05 NOTE — Patient Instructions (Signed)
Edward [name] , Thank you for taking time to come for your Medicare Wellness Visit. I appreciate your ongoing commitment to your health goals. Please review the following plan we discussed and let me know if I can assist you in the future.   Screening recommendations/referrals: Colonoscopy: Up to date, due 04/2022 Recommended yearly ophthalmology/optometry visit for glaucoma screening and checkup Recommended yearly dental visit for hygiene and checkup  Vaccinations: Influenza vaccine: Currently due Pneumococcal vaccine: Completed series Tdap vaccine: Up to date, due 11/2022 Shingles vaccine: Pt declines today.     Advanced directives: Advance directive discussed with you today. I will mail a copy for you to complete at home and have notarized. Once this is complete please bring a copy in to our office so we can scan it into your chart.  Conditions/risks identified: Recommend to exercise for 3 days a week for at least 30 minutes at a time.   Next appointment: Declined scheduling a CPE this year. Scheduled an AWV for 10/15/19.  Preventive Care 13 Years and Older, Male Preventive care refers to lifestyle choices and visits with your health care provider that can promote health and wellness. What does preventive care include?  A yearly physical exam. This is also called an annual well check.  Dental exams once or twice a year.  Routine eye exams. Ask your health care provider how often you should have your eyes checked.  Personal lifestyle choices, including:  Daily care of your teeth and gums.  Regular physical activity.  Eating a healthy diet.  Avoiding tobacco and drug use.  Limiting alcohol use.  Practicing safe sex.  Taking low doses of aspirin every day.  Taking vitamin and mineral supplements as recommended by your health care provider. What happens during an annual well check? The services and screenings done by your health care provider during your annual well check will  depend on your age, overall health, lifestyle risk factors, and family history of disease. Counseling  Your health care provider may ask you questions about your:  Alcohol use.  Tobacco use.  Drug use.  Emotional well-being.  Home and relationship well-being.  Sexual activity.  Eating habits.  History of falls.  Memory and ability to understand (cognition).  Work and work Statistician. Screening  You may have the following tests or measurements:  Height, weight, and BMI.  Blood pressure.  Lipid and cholesterol levels. These may be checked every 5 years, or more frequently if you are over 60 years old.  Skin check.  Lung cancer screening. You may have this screening every year starting at age 31 if you have a 30-pack-year history of smoking and currently smoke or have quit within the past 15 years.  Fecal occult blood test (FOBT) of the stool. You may have this test every year starting at age 95.  Flexible sigmoidoscopy or colonoscopy. You may have a sigmoidoscopy every 5 years or a colonoscopy every 10 years starting at age 33.  Prostate cancer screening. Recommendations will vary depending on your family history and other risks.  Hepatitis C blood test.  Hepatitis B blood test.  Sexually transmitted disease (STD) testing.  Diabetes screening. This is done by checking your blood sugar (glucose) after you have not eaten for a while (fasting). You may have this done every 1-3 years.  Abdominal aortic aneurysm (AAA) screening. You may need this if you are a current or former smoker.  Osteoporosis. You may be screened starting at age 39 if you are at  high risk. Talk with your health care provider about your test results, treatment options, and if necessary, the need for more tests. Vaccines  Your health care provider may recommend certain vaccines, such as:  Influenza vaccine. This is recommended every year.  Tetanus, diphtheria, and acellular pertussis (Tdap,  Td) vaccine. You may need a Td booster every 10 years.  Zoster vaccine. You may need this after age 74.  Pneumococcal 13-valent conjugate (PCV13) vaccine. One dose is recommended after age 96.  Pneumococcal polysaccharide (PPSV23) vaccine. One dose is recommended after age 56. Talk to your health care provider about which screenings and vaccines you need and how often you need them. This information is not intended to replace advice given to you by your health care provider. Make sure you discuss any questions you have with your health care provider. Document Released: 02/14/2015 Document Revised: 10/08/2015 Document Reviewed: 11/19/2014 Elsevier Interactive Patient Education  2017 [place] Prevention in the Home Falls can cause injuries. They can happen to people of all ages. There are many things you can do to make your home safe and to help prevent falls. What can I do on the outside of my home?  Regularly fix the edges of walkways and driveways and fix any cracks.  Remove anything that might make you trip as you walk through a door, such as a raised step or threshold.  Trim any bushes or trees on the path to your home.  Use bright outdoor lighting.  Clear any walking paths of anything that might make someone trip, such as rocks or tools.  Regularly check to see if handrails are loose or broken. Make sure that both sides of any steps have handrails.  Any raised decks and porches should have guardrails on the edges.  Have any leaves, snow, or ice cleared regularly.  Use sand or salt on walking paths during winter.  Clean up any spills in your garage right away. This includes oil or grease spills. What can I do in the bathroom?  Use night lights.  Install grab bars by the toilet and in the tub and shower. Do not use towel bars as grab bars.  Use non-skid mats or decals in the tub or shower.  If you need to sit down in the shower, use a plastic, non-slip stool.   Keep the floor dry. Clean up any water that spills on the floor as soon as it happens.  Remove soap buildup in the tub or shower regularly.  Attach bath mats securely with double-sided non-slip rug tape.  Do not have throw rugs and other things on the floor that can make you trip. What can I do in the bedroom?  Use night lights.  Make sure that you have a light by your bed that is easy to reach.  Do not use any sheets or blankets that are too big for your bed. They should not hang down onto the floor.  Have a firm chair that has side arms. You can use this for support while you get dressed.  Do not have throw rugs and other things on the floor that can make you trip. What can I do in the kitchen?  Clean up any spills right away.  Avoid walking on wet floors.  Keep items that you use a lot in easy-to-reach places.  If you need to reach something above you, use a strong step stool that has a grab bar.  Keep electrical cords out of the  way.  Do not use floor polish or wax that makes floors slippery. If you must use wax, use non-skid floor wax.  Do not have throw rugs and other things on the floor that can make you trip. What can I do with my stairs?  Do not leave any items on the stairs.  Make sure that there are handrails on both sides of the stairs and use them. Fix handrails that are broken or loose. Make sure that handrails are as long as the stairways.  Check any carpeting to make sure that it is firmly attached to the stairs. Fix any carpet that is loose or worn.  Avoid having throw rugs at the top or bottom of the stairs. If you do have throw rugs, attach them to the floor with carpet tape.  Make sure that you have a light switch at the top of the stairs and the bottom of the stairs. If you do not have them, ask someone to add them for you. What else can I do to help prevent falls?  Wear shoes that:  Do not have high heels.  Have rubber bottoms.  Are  comfortable and fit you well.  Are closed at the toe. Do not wear sandals.  If you use a stepladder:  Make sure that it is fully opened. Do not climb a closed stepladder.  Make sure that both sides of the stepladder are locked into place.  Ask someone to hold it for you, if possible.  Clearly mark and make sure that you can see:  Any grab bars or handrails.  First and last steps.  Where the edge of each step is.  Use tools that help you move around (mobility aids) if they are needed. These include:  Canes.  Walkers.  Scooters.  Crutches.  Turn on the lights when you go into a dark area. Replace any light bulbs as soon as they burn out.  Set up your furniture so you have a clear path. Avoid moving your furniture around.  If any of your floors are uneven, fix them.  If there are any pets around you, be aware of where they are.  Review your medicines with your doctor. Some medicines can make you feel dizzy. This can increase your chance of falling. Ask your doctor what other things that you can do to help prevent falls. This information is not intended to replace advice given to you by your health care provider. Make sure you discuss any questions you have with your health care provider. Document Released: 11/14/2008 Document Revised: 06/26/2015 Document Reviewed: 02/22/2014 Elsevier Interactive Patient Education  2017 [employer].

## 2018-10-17 ENCOUNTER — Other Ambulatory Visit: Payer: Self-pay | Admitting: Family Medicine

## 2018-10-17 DIAGNOSIS — F419 Anxiety disorder, unspecified: Secondary | ICD-10-CM

## 2018-10-17 MED ORDER — LORAZEPAM 0.5 MG PO TABS: 0.5000 mg | ORAL_TABLET | Freq: Three times a day (TID) | ORAL | 0 refills | Status: DC | PRN

## 2018-10-17 NOTE — Telephone Encounter (Signed)
Completed refill order for the Lorazepam prn anxiety attacks.

## 2018-10-17 NOTE — Telephone Encounter (Signed)
Pt needing a refill on:  LORazepam (ATIVAN) 0.5 MG tablet  Please fill at:   [id number] - [place], [place] (Phone) [phone] (Fax)   Thanks, [employer]

## 2018-10-17 NOTE — Telephone Encounter (Signed)
Edward [name] I need you to pull this down to order.  I was going to put it on an encounter but it is wanting to change the medication and I didn't know which you wanted to do.

## 2018-12-15 ENCOUNTER — Telehealth: Payer: Self-pay | Admitting: Internal Medicine

## 2018-12-15 NOTE — Telephone Encounter (Signed)
Please call patient regarding the tier reduction he is requesting for his Ranexa.

## 2018-12-15 NOTE — Telephone Encounter (Signed)
Pharmacy updated.

## 2018-12-15 NOTE — Telephone Encounter (Signed)
Patient callingand wanted to update office Patient will be switching pharmacy companies for the new year Will be using [facility] mail in [place]  Group # [id number] Member ID [id number]

## 2018-12-19 NOTE — Telephone Encounter (Signed)
Spoke with patient. He is switching to [place]. His current prescription drug plan end in December. He is almost ready to have his prescriptions called to his new plan. Patient is due for follow up. Scheduled him to see Sharolyn Douglas in December. At that time we will send in refills of prescriptions to his new pharmacy as advised by provider. Advised patient that at that time, we will be able to see if tier reductions or prior auths are needed for his medications. Patient was very Patent attorney.

## 2019-01-04 ENCOUNTER — Encounter: Payer: Self-pay | Admitting: Nurse Practitioner

## 2019-01-04 ENCOUNTER — Ambulatory Visit (INDEPENDENT_AMBULATORY_CARE_PROVIDER_SITE_OTHER): Payer: Medicare Other | Admitting: Physician Assistant

## 2019-01-04 ENCOUNTER — Telehealth: Payer: Self-pay

## 2019-01-04 ENCOUNTER — Other Ambulatory Visit: Payer: Self-pay

## 2019-01-04 VITALS — BP 110/66 | HR 58 | Ht 70.0 in | Wt 203.0 lb

## 2019-01-04 DIAGNOSIS — I255 Ischemic cardiomyopathy: Secondary | ICD-10-CM

## 2019-01-04 DIAGNOSIS — F1090 Alcohol use, unspecified, uncomplicated: Secondary | ICD-10-CM

## 2019-01-04 DIAGNOSIS — I6523 Occlusion and stenosis of bilateral carotid arteries: Secondary | ICD-10-CM

## 2019-01-04 DIAGNOSIS — F1290 Cannabis use, unspecified, uncomplicated: Secondary | ICD-10-CM

## 2019-01-04 DIAGNOSIS — I25118 Atherosclerotic heart disease of native coronary artery with other forms of angina pectoris: Secondary | ICD-10-CM

## 2019-01-04 DIAGNOSIS — I5022 Chronic systolic (congestive) heart failure: Secondary | ICD-10-CM

## 2019-01-04 DIAGNOSIS — I10 Essential (primary) hypertension: Secondary | ICD-10-CM | POA: Diagnosis not present

## 2019-01-04 DIAGNOSIS — R002 Palpitations: Secondary | ICD-10-CM

## 2019-01-04 DIAGNOSIS — E785 Hyperlipidemia, unspecified: Secondary | ICD-10-CM

## 2019-01-04 DIAGNOSIS — Z789 Other specified health status: Secondary | ICD-10-CM

## 2019-01-04 DIAGNOSIS — Z7289 Other problems related to lifestyle: Secondary | ICD-10-CM

## 2019-01-04 NOTE — Telephone Encounter (Signed)
[place] @ [phone] to apply for a tier exception for Ranexa 500 mg bid. Tier exception completed. Decision to be made in 24-72 hours. Decision will be faxed to our office. Ref# [id number]

## 2019-01-04 NOTE — Patient Instructions (Signed)
Medication Instructions:  Your physician recommends that you continue on your current medications as directed. Please refer to the Current Medication list given to you today.  *If you need a refill on your cardiac medications before your next appointment, please call your pharmacy*  Lab Work: Bmet and Mag today  If you have labs (blood work) drawn today and your tests are completely normal, you will receive your results only by: Marland Kitchen MyChart Message (if you have MyChart) OR . A paper copy in the mail If you have any lab test that is abnormal or we need to change your treatment, we will call you to review the results.  Testing/Procedures: None ordered  Follow-Up: At [facility], you and your health needs are our priority.  As part of our continuing mission to provide you with exceptional heart care, we have created designated Provider Care Teams.  These Care Teams include your primary Cardiologist (physician) and Advanced Practice Providers (APPs -  Physician Assistants and Nurse Practitioners) who all work together to provide you with the care you need, when you need it.  Your next appointment:   6 month(s)  The format for your next appointment:   In Person  Provider:    You may see Nelva Bush, MD or one of the following Advanced Practice Providers on your designated Care Team:    Murray Hodgkins, NP  Christell Faith, PA-C  Marrianne Mood, PA-C   Other Instructions We will attempt to apply for a tier exception with your health insurance for Ranexa.

## 2019-01-04 NOTE — Progress Notes (Signed)
Office Visit    Patient Name: Edward [name]. Date of Encounter: 01/04/2019  Primary Care Provider:  Margo Common, PA Primary Cardiologist:  Nelva Bush, MD  Chief Complaint    68 year old male with history of CAD s/p primary PCI to the LAD (03/2016) in the setting of anterior STEMI, chronic systolic heart failure 2/2 ICM (EF [id number], [id number]) complicated by apical thrombus, hypertension, marijuana and alcohol use, bilateral carotid artery stenosis (1-39% R/L ICA, 10/2017) and hyperlipidemia, and who is being seen today for 4 month follow-up of CAD and HFrEF.  Past Medical History    Past Medical History:  Diagnosis Date  . Anxiety   . Apical mural thrombus    a. s/p 3 months of anticoagulation  . BPH (benign prostatic hyperplasia)   . Coronary artery disease    a. 03/2016: LM nl, LAD thrombotic occlusion (3.0x38 Resolute Integrity DES), D1 70 (jailed), mild to mod nonobs LCx & RCA dzs; b. MV 7/18: Mod mid/apical antsept defect, no isch, EF 45%->Med Rx; c. 09/2017 Cath: LM nl, LAD patent stent, D1 70ost (jailed), LCX 50p, OM1 100, OM2 30, RCA 30p, RPAV 50, EF 50-55%->Med Rx.  . HFrEF (heart failure with reduced ejection fraction) ([place])    a. TTE 2/18:  EF of 30-35% (in setting of MI); b. TTE 5/18: EF 35-40%; c. 10/2017 Echo: EF 40-45%, mid-apicalantsept, ant, ap sev HK, Gr1 DD. Mild BAE.  Marland Kitchen Hyperlipidemia   . Hypertension   . Ischemic cardiomyopathy    a. TTE 2/18:  EF of 30-35% with LAD territory AK, likely mural thrombus at the LV apex; b. TTE 5/18: EF 35-40%, mid and apical anterior/anteroseptal AK, no evidence of LV thrombus, Gr1DD, mild BAE, normal RVSF; c. 10/2017 Echo: EF 40-45%, mid-apicalantsept, ant, ap sev HK, Gr1 DD. Mild BAE.  Marland Kitchen Myocardial infarction [facility])    Past Surgical History:  Procedure Laterality Date  . COLONOSCOPY WITH PROPOFOL N/A 04/22/2017   Procedure: COLONOSCOPY WITH PROPOFOL;  Surgeon: Jonathon Bellows, MD;  Location: [facility] ENDOSCOPY;  Service:  Gastroenterology;  Laterality: N/A;  . CORONARY ANGIOGRAPHY N/A 03/09/2016   Procedure: Coronary Angiography;  Surgeon: Nelva Bush, MD;  Location: [place] CV LAB;  Service: Cardiovascular;  Laterality: N/A;  . CORONARY STENT INTERVENTION N/A 03/09/2016   Procedure: Coronary Stent Intervention;  Surgeon: Nelva Bush, MD;  Location: [place] CV LAB;  Service: Cardiovascular;  Laterality: N/A;  . HERNIA REPAIR  2005  . HYDROCELE EXCISION    . LEFT HEART CATH AND CORONARY ANGIOGRAPHY Left 09/20/2017   Procedure: LEFT HEART CATH AND CORONARY ANGIOGRAPHY;  Surgeon: Nelva Bush, MD;  Location: [place] CV LAB;  Service: Cardiovascular;  Laterality: Left;    Allergies  No Known Allergies  History of Present Illness    67 year old male with PMH as above and including CAD s/p late presenting anterior ST segment elevation MI (03/2016), requiring DES to p/mLAD, and with first diag jailed with 70% ostial stenosis as a result of the LAD stenting. He also has a history of chronic systolic heart failure secondary to ischemic heart cardiomyopathy, complicated by apical thrombus.  He underwent echo 06/2016 that showed improved EF from 30-35%  35-40%. Stress test 08/2016 was ruled intermediate in the setting of prior m/dLAD distribution infarct without ischemia. Due to recurrent chest pain, he underwent 09/2017 cardiac catheterization that showed patent LAD stent with otherwise stable noncritical CAD.  Medical therapy was recommended with the addition of ranolazine 500mg  BID. He then underwent  10/2017 echo that showed EF improved slightly to 40-45% with hypokinesis of the anterior region, consistent with his previous MI and G1DD. At his 05/2018 follow-up, he was doing relatively well from a cardiac standpoint. He noted continued intermittent heartburn, though more infrequent and less severe as in the past.  He felt his chest pain was well controlled with ranolazine and brought up a desire to  discontinue this antianginal therapy though agreed to continue at that time per documentation.  He was trying to walk more with ~8000-10,000 steps per day.  He continued DAPT with aspirin and clopidogrel without any signs of bleeding.  Bradycardic rates precluded escalation of carvedilol.  Orthostatic lightheadedness and h/o low normal blood pressure precluded increase of losartan/spironolactone.   Since his last appointment, he has been doing well from a cardiac standpoint. He continues to experience intermittent atypical discomfort, suggestive of GERD. He also notes that these episodes are worse when he drinks beer. He recently has increased his alcohol intake, drinking 2-3 times per week. He drinks 1-2 (max 3-4 beers) or glasses of wine on these occasions. He is not smoking cigarettes but notes intermittent marijuana use at 2-3 times per week. He occasionally experiences palpitations when smoking marijuana. He also admits that he recently has been eating less healthy foods but aware that he should return to a better diet. He denies any racing HR. He does continue to experience orthostatic episodes or feeling "woozy" if he gets up too quickly. He also notes that when he leans over to pick something up off the floor he will get a short episode of palpitations, which concerns him since that is what occurred prior to his MI; however, this is unchanged for at least a year and does not occur with the same bilateral arm heaviness that he felt prior to his MI. He denied any b/l LEE, abdominal distention, or early satiety. 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 He denies any dizziness / presyncope or syncope. No s/sx of bleeding. He reports medication compliance. He is taking his BP once in a while with most BP readings in the  110s and at least 2 episodes of SBP 190; however, this higher BP is usually associated with his anxiety and resolves within a half hour with BP returning to his usual 110. He was continuing to walk daily, often reaching 11,000 steps a day.  07/27/2018 labs Na 140, K 4.2, Cr 0.89, BUN 17, AST 20, ALT 13, albumin 4.3. LDL 44, total cholesterol 111. Hgb 14.4. TSH 1.620  Home Medications    Prior to Admission medications   Medication Sig Start Date End Date Taking? Authorizing Provider  aspirin EC 81 MG tablet Take 1 tablet (81 mg total) by mouth daily. 06/16/16   End, Harrell Gave, MD  atorvastatin (LIPITOR) 80 MG tablet Take 1 tablet (80 mg total) by mouth daily. 05/18/18   End, Harrell Gave, MD  carvedilol (COREG) 3.125 MG tablet Take 1 tablet (3.125 mg total) by mouth 2 (two) times daily. 05/18/18   End, Harrell Gave, MD  clopidogrel (PLAVIX) 75 MG tablet Take 1 tablet (75 mg total) by mouth daily. 05/18/18   End, Harrell Gave, MD  LORazepam (ATIVAN) 0.5 MG tablet Take 1 tablet (0.5 mg total) by mouth every 8 (eight) hours as needed for anxiety. 10/17/18  Chrismon, Vickki Muff, PA  losartan (COZAAR) 25 MG tablet Take 1 tablet (25 mg total) by mouth daily. 07/14/17 10/05/18  End, Harrell Gave, MD  nitroGLYCERIN (NITROSTAT) 0.4 MG SL tablet Place 1 tablet (0.4 mg total) under the tongue every 5 (five) minutes as needed for chest pain. Maximum of 3 doses. 11/25/17 10/05/18  End, Harrell Gave, MD  ranolazine (RANEXA) 500 MG 12 hr tablet Take 1 tablet (500 mg total) by mouth 2 (two) times daily. 05/18/18   End, Harrell Gave, MD  spironolactone (ALDACTONE) 25 MG tablet Take 0.5 tablets (12.5 mg total) by mouth daily. 05/18/18   End, Harrell Gave, MD    Review of Systems    He denies chest pain but does not atypical discomfort consistent with GERD. He reports palpitations when smoking marijuana, as well as when leaning over to pick something off of the floor, which is unchanged for him. No reported dyspnea, pnd, orthopnea,  n, v, dizziness, syncope, edema, weight gain, or early satiety. He does note orthostatic symptoms when getting up too quickly, which is unchanged from previously reported episodes. He reports increased anxiety. All other systems reviewed and are otherwise negative except as noted above.  Physical Exam    VS:  BP 110/66 (BP Location: Left Arm, Patient Position: Sitting, Cuff Size: Normal)   Pulse (!) 58   Ht 5\' 10"  (1.778 m)   Wt 203 lb (92.1 kg)   BMI 29.13 kg/m  , BMI Body mass index is 29.13 kg/m. GEN: Well nourished, well developed, in no acute distress. HEENT: normal. Neck: Supple, no JVD, carotid bruits, or masses. Cardiac: bradycardic and regular, no murmurs, rubs, or gallops. No clubbing, cyanosis, edema.  Radials 2+ L and 1+ R, /DP/PT 2+ and equal bilaterally.  Respiratory:  Respirations regular and unlabored, clear to auscultation bilaterally. GI: Soft, nontender, nondistended, BS + x 4. MS: no deformity or atrophy. Skin: warm and dry, no rash. Neuro:  Strength and sensation are intact. Psych: Normal affect.  Accessory Clinical Findings    ECG - SB, 58bpm, IVCD with incomplete RBBB/LAFB, nonspecific ST/T wave abnormality present on previous EKG.  Reviewed studies: TTE (10/13/2017): Technically difficult study.  Normal LV size with mild LVH.  LVEF 40 to 45% with severe hypokinesis of the mid and apical anterior/anteroseptal walls as well as the apex.  Grade 1 diastolic dysfunction.  Mild biatrial enlargement.  Carotid Doppler (10/13/2017): Mild bilateral ICA (less than 40%) stenoses.  Antegrade flow in both vertebral arteries.  Normal subclavian hemodynamics.  LHC (09/20/2017): LMCA normal.  LAD with patent proximal/mid vessel stent.  D1 with 70% ostial stenosis.  LCx with 50% proximal stenosis.  Chronic total occlusion of small OM1 branch noted.  30% proximal OM 2 stenosis noted.  RCA with minimal luminal irregularities and 50% stenosis in proximal RPL.  LVEDP 10 to 15 mmHg.   LVEF 50 to 55% with apical inferior hypokinesis.  Assessment & Plan    1.  CAD s/p PCI and with stable angina --CP well controlled. Continues to experience periodic GERD / atypical CP, orthostatic hypotension, and chronic / stable palpitations as above in HPI. LHC 09/2017 as above with patent stent. --No medication changes. Continue ranolazine. Patient requested that we again submit for tier reduction for this medication, as his cost of medication recently increased to $200 and he will need refills in January 2021. Continue Coreg 3.125mg  BID. Escalation of BB precluded by soft BP and bradycardic rate. Continue DAPT with ASA and clopidogrel. No s/sx of bleeding.  2. Palpitations, chronic  --Patient reports concern regarding palpitations; however, these are unchanged from 2012 and do not interfere with his lifestyle. Occur specifically with smoking marijuana and leaning over to pick something up off the ground. No other associated sx, including racing HR, presyncope, or syncope. 07/2018 TSH nl. Recommend cessation of marijuana and alcohol as below.  --No further workup at this time.   3. Chronic systolic heart failure due to ICM --No reported symptoms of worsening heart failure.  Stable weight. Echo as above with EF 40-45%. --No medication changes. As above, bradycardia precludes escalation of carvedilol. Given continued intermittent orthostatic/lightheadedness and soft BP today, defer escalation of losartan or spironolactone. No medication changes.  --Labs today: BMET, Mg.  4. HLD --Most recent LDL below goal 70 (LDL 44) with stable liver function. Continue statin.  5. Marijuana and alcohol use --Cessation advised.   6. Bilateral carotid artery stenosis --10/2017 study showed bilateral 1-39% R/L ICA. No significant bruits heard on exam. No recent amaurosis fugax, presyncope, or syncope reported. If new or concerning symptoms in the future, can update previous study at that time.  Disposition:  Follow-up in 6 months or sooner if needed. Obtain BMET, Mg labs today.  More than 30 minutes was spent discussing patient symptoms, previous results, and current plan of care and prescriptions.   Arvil Chaco, NP 01/04/2019, 10:57 AM Pager [phone]

## 2019-01-05 LAB — BASIC METABOLIC PANEL
BUN/Creatinine Ratio: 14 (ref 10–24)
BUN: 12 mg/dL (ref 8–27)
CO2: 21 mmol/L (ref 20–29)
Calcium: 8.9 mg/dL (ref 8.6–10.2)
Chloride: 105 mmol/L (ref 96–106)
Creatinine, Ser: 0.86 mg/dL (ref 0.76–1.27)
GFR calc Af Amer: 104 mL/min/{1.73_m2} (ref >59)
GFR calc non Af Amer: 90 mL/min/{1.73_m2} (ref >59)
Glucose: 104 mg/dL — ABNORMAL HIGH (ref 65–99)
Potassium: 4.5 mmol/L (ref 3.5–5.2)
Sodium: 142 mmol/L (ref 134–144)

## 2019-01-05 LAB — MAGNESIUM: Magnesium: 2.1 mg/dL (ref 1.6–2.3)

## 2019-01-08 NOTE — Telephone Encounter (Signed)
Received from [employer] that "coverage or payment under you Medicare Part D benefit for Ranolazine ER 500 mg tablet was denied." This was in regards to request for Tier exception.  Called patient to notify him. He verbalized understanding and has not yet received his copy in the mail. The plan is for patient to contact us if appeal is needed or patient needs to have the Rx sent to another pharmacy where he can use the GoodRx coupon. He will hold off on doing an appeal for now and wait to see what his cost will be in the new year. He was very Patent attorney.

## 2019-01-09 ENCOUNTER — Telehealth: Payer: Self-pay

## 2019-01-09 NOTE — Telephone Encounter (Signed)
Call to patient to review labs.   No further orders or questions at this time.   Advised pt to call for any further questions or concerns.

## 2019-01-09 NOTE — Telephone Encounter (Signed)
-----   Message from Arvil Chaco, PA-C sent at 01/08/2019  5:43 PM EST ----- Please let [name]. know that his labs came back normal with renal function and electrolytes stable from that of previous labs.

## 2019-02-12 ENCOUNTER — Other Ambulatory Visit: Payer: Self-pay

## 2019-02-12 ENCOUNTER — Telehealth: Payer: Self-pay | Admitting: Internal Medicine

## 2019-02-12 MED ORDER — CLOPIDOGREL BISULFATE 75 MG PO TABS: 75.0000 mg | ORAL_TABLET | Freq: Every day | ORAL | 2 refills | Status: DC

## 2019-02-12 MED ORDER — NITROGLYCERIN 0.4 MG SL SUBL: 0.4000 mg | SUBLINGUAL_TABLET | SUBLINGUAL | 2 refills | Status: DC | PRN

## 2019-02-12 MED ORDER — CARVEDILOL 3.125 MG PO TABS: 3.1250 mg | ORAL_TABLET | Freq: Two times a day (BID) | ORAL | 0 refills | Status: DC

## 2019-02-12 MED ORDER — ATORVASTATIN CALCIUM 80 MG PO TABS: 80.0000 mg | ORAL_TABLET | Freq: Every day | ORAL | 0 refills | Status: DC

## 2019-02-12 MED ORDER — SPIRONOLACTONE 25 MG PO TABS: 12.5000 mg | ORAL_TABLET | Freq: Every day | ORAL | 2 refills | Status: DC

## 2019-02-12 MED ORDER — RANOLAZINE ER 500 MG PO TB12: 500.0000 mg | ORAL_TABLET | Freq: Two times a day (BID) | ORAL | 2 refills | Status: DC

## 2019-02-12 MED ORDER — LOSARTAN POTASSIUM 25 MG PO TABS: 25.0000 mg | ORAL_TABLET | Freq: Every day | ORAL | 2 refills | Status: DC

## 2019-02-12 NOTE — Telephone Encounter (Signed)
*  STAT* If patient is at the pharmacy, call can be transferred to refill team.   1. Which medications need to be refilled? (please list name of each medication and dose if known) all current rx's  2. Which pharmacy/location (including street and city if local pharmacy) is medication to be sent to? [facility] (new pharmacy)  3. Do they need a 30 day or 90 day supply? [place]

## 2019-02-12 NOTE — Telephone Encounter (Signed)
All medications have been refilled

## 2019-02-28 DIAGNOSIS — Z23 Encounter for immunization: Secondary | ICD-10-CM | POA: Diagnosis not present

## 2019-04-05 ENCOUNTER — Ambulatory Visit: Payer: Medicare Other | Attending: Internal Medicine

## 2019-04-05 DIAGNOSIS — Z23 Encounter for immunization: Secondary | ICD-10-CM | POA: Insufficient documentation

## 2019-04-05 NOTE — Progress Notes (Signed)
   [id number]  Name:  Saveon Barsamian.    MRN: [id number] DOB: [birth year]  04/05/2019  Mr. Rooke was observed post Covid-19 immunization for 15 minutes without incident. He was provided with Vaccine Information Sheet and instruction to access the V-Safe system.   Mr. Kenworthy was instructed to call 911 with any severe reactions post vaccine: Marland Kitchen Difficulty breathing  . Swelling of face and throat  . A fast heartbeat  . A bad rash all over body  . Dizziness and weakness   Immunizations Administered    Name Date Dose VIS Date Route   Pfizer COVID-19 Vaccine 04/05/2019  9:02 AM 0.3 mL 01/12/2019 Intramuscular   Manufacturer: [place]   Lot: [id number]   [place]: [id number]

## 2019-04-06 DIAGNOSIS — D689 Coagulation defect, unspecified: Secondary | ICD-10-CM | POA: Diagnosis present

## 2019-04-06 DIAGNOSIS — T3110 Burns involving 10-19% of body surface with 0% to 9% third degree burns: Secondary | ICD-10-CM | POA: Diagnosis not present

## 2019-04-06 DIAGNOSIS — D62 Acute posthemorrhagic anemia: Secondary | ICD-10-CM | POA: Diagnosis present

## 2019-04-06 DIAGNOSIS — T25311A Burn of third degree of right ankle, initial encounter: Secondary | ICD-10-CM | POA: Diagnosis present

## 2019-04-06 DIAGNOSIS — S36032A Major laceration of spleen, initial encounter: Secondary | ICD-10-CM | POA: Diagnosis present

## 2019-04-06 DIAGNOSIS — R918 Other nonspecific abnormal finding of lung field: Secondary | ICD-10-CM | POA: Diagnosis not present

## 2019-04-06 DIAGNOSIS — S3600XA Unspecified injury of spleen, initial encounter: Secondary | ICD-10-CM | POA: Diagnosis not present

## 2019-04-06 DIAGNOSIS — T300 Burn of unspecified body region, unspecified degree: Secondary | ICD-10-CM | POA: Diagnosis not present

## 2019-04-06 DIAGNOSIS — I2510 Atherosclerotic heart disease of native coronary artery without angina pectoris: Secondary | ICD-10-CM | POA: Diagnosis not present

## 2019-04-06 DIAGNOSIS — S32018A Other fracture of first lumbar vertebra, initial encounter for closed fracture: Secondary | ICD-10-CM | POA: Diagnosis not present

## 2019-04-06 DIAGNOSIS — S3609XA Other injury of spleen, initial encounter: Secondary | ICD-10-CM | POA: Diagnosis not present

## 2019-04-06 DIAGNOSIS — G8911 Acute pain due to trauma: Secondary | ICD-10-CM | POA: Diagnosis not present

## 2019-04-06 DIAGNOSIS — S0240FA Zygomatic fracture, left side, initial encounter for closed fracture: Secondary | ICD-10-CM | POA: Diagnosis present

## 2019-04-06 DIAGNOSIS — R0902 Hypoxemia: Secondary | ICD-10-CM | POA: Diagnosis not present

## 2019-04-06 DIAGNOSIS — T3111 Burns involving 10-19% of body surface with 10-19% third degree burns: Secondary | ICD-10-CM | POA: Diagnosis present

## 2019-04-06 DIAGNOSIS — S27322A Contusion of lung, bilateral, initial encounter: Secondary | ICD-10-CM | POA: Diagnosis not present

## 2019-04-06 DIAGNOSIS — S22058A Other fracture of T5-T6 vertebra, initial encounter for closed fracture: Secondary | ICD-10-CM | POA: Diagnosis not present

## 2019-04-06 DIAGNOSIS — T24301A Burn of third degree of unspecified site of right lower limb, except ankle and foot, initial encounter: Secondary | ICD-10-CM | POA: Diagnosis not present

## 2019-04-06 DIAGNOSIS — S066X9A Traumatic subarachnoid hemorrhage with loss of consciousness of unspecified duration, initial encounter: Secondary | ICD-10-CM | POA: Diagnosis not present

## 2019-04-06 DIAGNOSIS — T2135XA Burn of third degree of buttock, initial encounter: Secondary | ICD-10-CM | POA: Diagnosis present

## 2019-04-06 DIAGNOSIS — R930 Abnormal findings on diagnostic imaging of skull and head, not elsewhere classified: Secondary | ICD-10-CM | POA: Diagnosis not present

## 2019-04-06 DIAGNOSIS — S066X0A Traumatic subarachnoid hemorrhage without loss of consciousness, initial encounter: Secondary | ICD-10-CM | POA: Diagnosis not present

## 2019-04-06 DIAGNOSIS — T24331A Burn of third degree of right lower leg, initial encounter: Secondary | ICD-10-CM | POA: Diagnosis present

## 2019-04-06 DIAGNOSIS — I739 Peripheral vascular disease, unspecified: Secondary | ICD-10-CM | POA: Diagnosis not present

## 2019-04-06 DIAGNOSIS — T24391A Burn of third degree of multiple sites of right lower limb, except ankle and foot, initial encounter: Secondary | ICD-10-CM | POA: Diagnosis not present

## 2019-04-06 DIAGNOSIS — Z20822 Contact with and (suspected) exposure to covid-19: Secondary | ICD-10-CM | POA: Diagnosis present

## 2019-04-06 DIAGNOSIS — S2020XA Contusion of thorax, unspecified, initial encounter: Secondary | ICD-10-CM | POA: Diagnosis not present

## 2019-04-06 DIAGNOSIS — R34 Anuria and oliguria: Secondary | ICD-10-CM | POA: Diagnosis not present

## 2019-04-06 DIAGNOSIS — K7689 Other specified diseases of liver: Secondary | ICD-10-CM | POA: Diagnosis not present

## 2019-04-06 DIAGNOSIS — I10 Essential (primary) hypertension: Secondary | ICD-10-CM | POA: Diagnosis not present

## 2019-04-06 DIAGNOSIS — S299XXA Unspecified injury of thorax, initial encounter: Secondary | ICD-10-CM | POA: Diagnosis not present

## 2019-04-06 DIAGNOSIS — T794XXA Traumatic shock, initial encounter: Secondary | ICD-10-CM | POA: Diagnosis present

## 2019-04-06 DIAGNOSIS — R1031 Right lower quadrant pain: Secondary | ICD-10-CM | POA: Diagnosis not present

## 2019-04-06 DIAGNOSIS — I509 Heart failure, unspecified: Secondary | ICD-10-CM | POA: Diagnosis not present

## 2019-04-06 DIAGNOSIS — T24321A Burn of third degree of right knee, initial encounter: Secondary | ICD-10-CM | POA: Diagnosis present

## 2019-04-06 DIAGNOSIS — E872 Acidosis: Secondary | ICD-10-CM | POA: Diagnosis not present

## 2019-04-06 DIAGNOSIS — I5022 Chronic systolic (congestive) heart failure: Secondary | ICD-10-CM | POA: Diagnosis present

## 2019-04-06 DIAGNOSIS — I7771 Dissection of carotid artery: Secondary | ICD-10-CM | POA: Diagnosis present

## 2019-04-06 DIAGNOSIS — S32019A Unspecified fracture of first lumbar vertebra, initial encounter for closed fracture: Secondary | ICD-10-CM | POA: Diagnosis present

## 2019-04-06 DIAGNOSIS — R6510 Systemic inflammatory response syndrome (SIRS) of non-infectious origin without acute organ dysfunction: Secondary | ICD-10-CM | POA: Diagnosis not present

## 2019-04-06 DIAGNOSIS — S36039A Unspecified laceration of spleen, initial encounter: Secondary | ICD-10-CM

## 2019-04-06 DIAGNOSIS — S27329A Contusion of lung, unspecified, initial encounter: Secondary | ICD-10-CM | POA: Diagnosis not present

## 2019-04-06 DIAGNOSIS — S79911A Unspecified injury of right hip, initial encounter: Secondary | ICD-10-CM | POA: Diagnosis not present

## 2019-04-06 DIAGNOSIS — M1611 Unilateral primary osteoarthritis, right hip: Secondary | ICD-10-CM | POA: Diagnosis not present

## 2019-04-06 DIAGNOSIS — S2232XA Fracture of one rib, left side, initial encounter for closed fracture: Secondary | ICD-10-CM | POA: Diagnosis present

## 2019-04-06 DIAGNOSIS — E876 Hypokalemia: Secondary | ICD-10-CM | POA: Diagnosis not present

## 2019-04-06 DIAGNOSIS — S022XXA Fracture of nasal bones, initial encounter for closed fracture: Secondary | ICD-10-CM | POA: Diagnosis not present

## 2019-04-06 DIAGNOSIS — T24311A Burn of third degree of right thigh, initial encounter: Secondary | ICD-10-CM | POA: Diagnosis present

## 2019-04-06 DIAGNOSIS — S3993XA Unspecified injury of pelvis, initial encounter: Secondary | ICD-10-CM | POA: Diagnosis not present

## 2019-04-06 DIAGNOSIS — T273XXA Burn of respiratory tract, part unspecified, initial encounter: Secondary | ICD-10-CM | POA: Diagnosis present

## 2019-04-06 DIAGNOSIS — S0003XA Contusion of scalp, initial encounter: Secondary | ICD-10-CM | POA: Diagnosis not present

## 2019-04-06 DIAGNOSIS — S0232XA Fracture of orbital floor, left side, initial encounter for closed fracture: Secondary | ICD-10-CM | POA: Diagnosis present

## 2019-04-06 DIAGNOSIS — S15002A Unspecified injury of left carotid artery, initial encounter: Secondary | ICD-10-CM | POA: Diagnosis present

## 2019-04-06 DIAGNOSIS — I959 Hypotension, unspecified: Secondary | ICD-10-CM | POA: Diagnosis not present

## 2019-04-06 DIAGNOSIS — M7989 Other specified soft tissue disorders: Secondary | ICD-10-CM | POA: Diagnosis not present

## 2019-04-06 DIAGNOSIS — S199XXA Unspecified injury of neck, initial encounter: Secondary | ICD-10-CM | POA: Diagnosis not present

## 2019-04-06 DIAGNOSIS — Z4682 Encounter for fitting and adjustment of non-vascular catheter: Secondary | ICD-10-CM | POA: Diagnosis not present

## 2019-04-06 DIAGNOSIS — L02415 Cutaneous abscess of right lower limb: Secondary | ICD-10-CM | POA: Diagnosis not present

## 2019-04-06 DIAGNOSIS — S22059A Unspecified fracture of T5-T6 vertebra, initial encounter for closed fracture: Secondary | ICD-10-CM | POA: Diagnosis present

## 2019-04-06 DIAGNOSIS — D72829 Elevated white blood cell count, unspecified: Secondary | ICD-10-CM | POA: Diagnosis not present

## 2019-04-06 DIAGNOSIS — K661 Hemoperitoneum: Secondary | ICD-10-CM | POA: Diagnosis not present

## 2019-04-06 DIAGNOSIS — J9811 Atelectasis: Secondary | ICD-10-CM | POA: Diagnosis not present

## 2019-04-06 DIAGNOSIS — S59901A Unspecified injury of right elbow, initial encounter: Secondary | ICD-10-CM | POA: Diagnosis not present

## 2019-04-06 DIAGNOSIS — R0689 Other abnormalities of breathing: Secondary | ICD-10-CM | POA: Diagnosis not present

## 2019-04-06 DIAGNOSIS — J90 Pleural effusion, not elsewhere classified: Secondary | ICD-10-CM | POA: Diagnosis not present

## 2019-04-06 HISTORY — PX: SPLENECTOMY: SUR1306

## 2019-04-06 HISTORY — DX: Unspecified laceration of spleen, initial encounter: S36.039A

## 2019-04-11 DIAGNOSIS — T24391A Burn of third degree of multiple sites of right lower limb, except ankle and foot, initial encounter: Secondary | ICD-10-CM | POA: Insufficient documentation

## 2019-05-01 ENCOUNTER — Ambulatory Visit: Payer: Medicare Other

## 2019-05-12 ENCOUNTER — Other Ambulatory Visit: Payer: Self-pay | Admitting: Internal Medicine

## 2019-06-13 ENCOUNTER — Telehealth: Payer: Self-pay | Admitting: Internal Medicine

## 2019-06-13 MED ORDER — CARBOXYMETHYLCELLULOSE SODIUM 0.25 % OP SOLN
1.00 | OPHTHALMIC | Status: DC
Start: ? — End: 2019-06-13

## 2019-06-13 MED ORDER — DERMACERIN EX CREA
1.00 | TOPICAL_CREAM | CUTANEOUS | Status: DC
Start: 2019-06-14 — End: 2019-06-13

## 2019-06-13 MED ORDER — ONDANSETRON HCL 4 MG/2ML IJ SOLN
4.00 | INTRAMUSCULAR | Status: DC
Start: ? — End: 2019-06-13

## 2019-06-13 MED ORDER — WH PETROL-MINERAL OIL-LANOLIN 0.1-0.1 % OP OINT
1.00 | TOPICAL_OINTMENT | OPHTHALMIC | Status: DC
Start: 2019-06-14 — End: 2019-06-13

## 2019-06-13 MED ORDER — CLOPIDOGREL BISULFATE 75 MG PO TABS
75.00 | ORAL_TABLET | ORAL | Status: DC
Start: 2019-06-15 — End: 2019-06-13

## 2019-06-13 MED ORDER — DSS 100 MG PO CAPS
100.00 | ORAL_CAPSULE | ORAL | Status: DC
Start: 2019-06-15 — End: 2019-06-13

## 2019-06-13 MED ORDER — BISACODYL 10 MG RE SUPP
10.00 | RECTAL | Status: DC
Start: ? — End: 2019-06-13

## 2019-06-13 MED ORDER — SIMETHICONE 80 MG PO CHEW
80.00 | CHEWABLE_TABLET | ORAL | Status: DC
Start: ? — End: 2019-06-13

## 2019-06-13 MED ORDER — ACETAMINOPHEN 500 MG PO TABS
1000.00 | ORAL_TABLET | ORAL | Status: DC
Start: ? — End: 2019-06-13

## 2019-06-13 MED ORDER — TAMSULOSIN HCL 0.4 MG PO CAPS
0.40 | ORAL_CAPSULE | ORAL | Status: DC
Start: 2019-06-14 — End: 2019-06-13

## 2019-06-13 MED ORDER — ASPIRIN 81 MG PO CHEW
81.00 | CHEWABLE_TABLET | ORAL | Status: DC
Start: 2019-06-15 — End: 2019-06-13

## 2019-06-13 MED ORDER — MUPIROCIN 2 % EX OINT
TOPICAL_OINTMENT | CUTANEOUS | Status: DC
Start: 2019-06-15 — End: 2019-06-13

## 2019-06-13 MED ORDER — OXYCODONE HCL 5 MG PO TABS
10.00 | ORAL_TABLET | ORAL | Status: DC
Start: ? — End: 2019-06-13

## 2019-06-13 MED ORDER — POLYETHYLENE GLYCOL 3350 17 GM/SCOOP PO POWD
17.00 | ORAL | Status: DC
Start: 2019-06-15 — End: 2019-06-13

## 2019-06-13 MED ORDER — ATORVASTATIN CALCIUM 40 MG PO TABS
40.00 | ORAL_TABLET | ORAL | Status: DC
Start: 2019-06-15 — End: 2019-06-13

## 2019-06-13 MED ORDER — THIAMINE HCL 100 MG PO TABS
100.00 | ORAL_TABLET | ORAL | Status: DC
Start: 2019-06-15 — End: 2019-06-13

## 2019-06-13 MED ORDER — ENOXAPARIN SODIUM 40 MG/0.4ML ~~LOC~~ SOLN
40.00 | SUBCUTANEOUS | Status: DC
Start: 2019-06-14 — End: 2019-06-13

## 2019-06-13 MED ORDER — OXYCODONE HCL 5 MG PO TABS
5.00 | ORAL_TABLET | ORAL | Status: DC
Start: ? — End: 2019-06-13

## 2019-06-13 MED ORDER — FOLIC ACID 1 MG PO TABS
1.00 | ORAL_TABLET | ORAL | Status: DC
Start: 2019-06-15 — End: 2019-06-13
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
## 2019-06-15 NOTE — Telephone Encounter (Signed)
Patient returning call.

## 2019-06-15 NOTE — Telephone Encounter (Signed)
I attempted to call the patient. °No answer- I left a message to please call back.  °

## 2019-06-15 NOTE — Telephone Encounter (Signed)
I spoke with the patient. He states that after speaking with Anderson Malta, RN yesterday, he was thinking about his Ranexa medication. He was told at discharge from [facility] to stop coreg/ losartan/ & spironolactone. He has been off Ranexa x 2.5 months without any chest pain. He wanted to know if she could stay off of this.  I advised the patient if he is not having any symptoms of chest pain/ discomfort, that he should be able to stay off of the Ranexa for now.  He confirms he does have ~ 2 months of Ranexa at home. I advised he can resume this at his previous dosage if symptoms of chest pain/ discomfort develop, otherwise, he can review with Dr. Saunders Revel at his follow up appt on 07/05/19.  The patient is aware I will notify Dr. Saunders Revel of our conversation. He voices understanding and is agreeable.

## 2019-06-16 DIAGNOSIS — T2039XA Burn of third degree of multiple sites of head, face, and neck, initial encounter: Secondary | ICD-10-CM | POA: Diagnosis not present
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
## 2019-06-21 ENCOUNTER — Ambulatory Visit: Payer: Medicare Other | Admitting: Family Medicine

## 2019-06-21 ENCOUNTER — Other Ambulatory Visit: Payer: Self-pay

## 2019-06-21 ENCOUNTER — Ambulatory Visit (INDEPENDENT_AMBULATORY_CARE_PROVIDER_SITE_OTHER): Payer: Medicare Other | Admitting: Family Medicine

## 2019-06-21 ENCOUNTER — Encounter: Payer: Self-pay | Admitting: Family Medicine

## 2019-06-21 VITALS — BP 134/86 | HR 82 | Temp 97.1°F | Wt 193.0 lb

## 2019-06-21 DIAGNOSIS — E785 Hyperlipidemia, unspecified: Secondary | ICD-10-CM

## 2019-06-21 DIAGNOSIS — I25119 Atherosclerotic heart disease of native coronary artery with unspecified angina pectoris: Secondary | ICD-10-CM | POA: Diagnosis not present

## 2019-06-21 DIAGNOSIS — S0292XD Unspecified fracture of facial bones, subsequent encounter for fracture with routine healing: Secondary | ICD-10-CM | POA: Diagnosis not present

## 2019-06-21 DIAGNOSIS — I10 Essential (primary) hypertension: Secondary | ICD-10-CM

## 2019-06-21 DIAGNOSIS — T24391D Burn of third degree of multiple sites of right lower limb, except ankle and foot, subsequent encounter: Secondary | ICD-10-CM | POA: Diagnosis not present

## 2019-06-22 DIAGNOSIS — Z7902 Long term (current) use of antithrombotics/antiplatelets: Secondary | ICD-10-CM | POA: Diagnosis not present

## 2019-06-22 DIAGNOSIS — T24391D Burn of third degree of multiple sites of right lower limb, except ankle and foot, subsequent encounter: Secondary | ICD-10-CM | POA: Diagnosis not present

## 2019-06-22 DIAGNOSIS — Z7982 Long term (current) use of aspirin: Secondary | ICD-10-CM | POA: Diagnosis not present

## 2019-06-22 DIAGNOSIS — I998 Other disorder of circulatory system: Secondary | ICD-10-CM | POA: Diagnosis not present

## 2019-06-22 DIAGNOSIS — T3111 Burns involving 10-19% of body surface with 10-19% third degree burns: Secondary | ICD-10-CM | POA: Diagnosis not present

## 2019-06-22 DIAGNOSIS — Z79899 Other long term (current) drug therapy: Secondary | ICD-10-CM | POA: Diagnosis not present

## 2019-06-26 DIAGNOSIS — I10 Essential (primary) hypertension: Secondary | ICD-10-CM | POA: Diagnosis not present

## 2019-06-26 DIAGNOSIS — T24391D Burn of third degree of multiple sites of right lower limb, except ankle and foot, subsequent encounter: Secondary | ICD-10-CM | POA: Diagnosis not present

## 2019-06-26 DIAGNOSIS — E785 Hyperlipidemia, unspecified: Secondary | ICD-10-CM | POA: Diagnosis not present

## 2019-06-26 DIAGNOSIS — I25119 Atherosclerotic heart disease of native coronary artery with unspecified angina pectoris: Secondary | ICD-10-CM | POA: Diagnosis not present

## 2019-06-27 DIAGNOSIS — Z23 Encounter for immunization: Secondary | ICD-10-CM | POA: Diagnosis not present

## 2019-06-27 LAB — COMPREHENSIVE METABOLIC PANEL
ALT: 9 IU/L (ref 0–44)
AST: 12 IU/L (ref 0–40)
Albumin/Globulin Ratio: 1.6 (ref 1.2–2.2)
Albumin: 4 g/dL (ref 3.8–4.8)
Alkaline Phosphatase: 72 IU/L (ref 48–121)
BUN/Creatinine Ratio: 25 — ABNORMAL HIGH (ref 10–24)
BUN: 17 mg/dL (ref 8–27)
Bilirubin Total: 0.2 mg/dL (ref 0.0–1.2)
CO2: 18 mmol/L — ABNORMAL LOW (ref 20–29)
Calcium: 9.5 mg/dL (ref 8.6–10.2)
Chloride: 108 mmol/L — ABNORMAL HIGH (ref 96–106)
Creatinine, Ser: 0.69 mg/dL — ABNORMAL LOW (ref 0.76–1.27)
GFR calc Af Amer: 114 mL/min/{1.73_m2} (ref >59)
GFR calc non Af Amer: 98 mL/min/{1.73_m2} (ref >59)
Globulin, Total: 2.5 g/dL (ref 1.5–4.5)
Glucose: 109 mg/dL — ABNORMAL HIGH (ref 65–99)
Potassium: 4.2 mmol/L (ref 3.5–5.2)
Sodium: 140 mmol/L (ref 134–144)
Total Protein: 6.5 g/dL (ref 6.0–8.5)

## 2019-06-27 LAB — CBC WITH DIFFERENTIAL/PLATELET
Basophils Absolute: 0.1 10*3/uL (ref 0.0–0.2)
Basos: 1 %
EOS (ABSOLUTE): 0.3 10*3/uL (ref 0.0–0.4)
Eos: 4 %
Hematocrit: 38.7 % (ref 37.5–51.0)
Hemoglobin: 12.4 g/dL — ABNORMAL LOW (ref 13.0–17.7)
Immature Grans (Abs): 0 10*3/uL (ref 0.0–0.1)
Immature Granulocytes: 0 %
Lymphocytes Absolute: 2.1 10*3/uL (ref 0.7–3.1)
Lymphs: 29 %
MCH: 29.1 pg (ref 26.6–33.0)
MCHC: 32 g/dL (ref 31.5–35.7)
MCV: 91 fL (ref 79–97)
Monocytes Absolute: 0.9 10*3/uL (ref 0.1–0.9)
Monocytes: 12 %
Neutrophils Absolute: 4 10*3/uL (ref 1.4–7.0)
Neutrophils: 54 %
Platelets: 432 10*3/uL (ref 150–450)
RBC: 4.26 x10E6/uL (ref 4.14–5.80)
RDW: 15.6 % — ABNORMAL HIGH (ref 11.6–15.4)
WBC: 7.5 10*3/uL (ref 3.4–10.8)

## 2019-06-27 LAB — LIPID PANEL
Chol/HDL Ratio: 2.2 ratio (ref 0.0–5.0)
Cholesterol, Total: 112 mg/dL (ref 100–199)
HDL: 51 mg/dL (ref >39)
LDL Chol Calc (NIH): 48 mg/dL (ref 0–99)
Triglycerides: 59 mg/dL (ref 0–149)
VLDL Cholesterol Cal: 13 mg/dL (ref 5–40)

## 2019-07-04 DIAGNOSIS — M2560 Stiffness of unspecified joint, not elsewhere classified: Secondary | ICD-10-CM | POA: Diagnosis not present

## 2019-07-04 DIAGNOSIS — T24391S Burn of third degree of multiple sites of right lower limb, except ankle and foot, sequela: Secondary | ICD-10-CM | POA: Diagnosis not present

## 2019-07-05 ENCOUNTER — Ambulatory Visit (INDEPENDENT_AMBULATORY_CARE_PROVIDER_SITE_OTHER): Payer: Medicare Other | Admitting: Internal Medicine

## 2019-07-05 ENCOUNTER — Other Ambulatory Visit: Payer: Self-pay

## 2019-07-05 ENCOUNTER — Encounter: Payer: Self-pay | Admitting: Internal Medicine

## 2019-07-05 VITALS — BP 130/80 | HR 74 | Ht 69.0 in | Wt 195.5 lb

## 2019-07-05 DIAGNOSIS — I2510 Atherosclerotic heart disease of native coronary artery without angina pectoris: Secondary | ICD-10-CM

## 2019-07-05 DIAGNOSIS — E785 Hyperlipidemia, unspecified: Secondary | ICD-10-CM

## 2019-07-05 DIAGNOSIS — I5022 Chronic systolic (congestive) heart failure: Secondary | ICD-10-CM

## 2019-07-05 MED ORDER — CARVEDILOL 3.125 MG PO TABS
3.1250 mg | ORAL_TABLET | Freq: Two times a day (BID) | ORAL | 1 refills | Status: DC
Start: 2019-07-05 — End: 2019-11-22
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
## 2019-07-05 NOTE — Patient Instructions (Signed)
Medication Instructions:  Your physician has recommended you make the following change in your medication:  1- START Carvedilol 3.125 mg (1 tablet) by mouth two times a day.  *If you need a refill on your cardiac medications before your next appointment, please call your pharmacy*   Lab Work: none If you have labs (blood work) drawn today and your tests are completely normal, you will receive your results only by: Marland Kitchen MyChart Message (if you have MyChart) OR . A paper copy in the mail If you have any lab test that is abnormal or we need to change your treatment, we will call you to review the results.   Testing/Procedures: Your physician has requested that you have an echocardiogram in the next 1-2 months. Echocardiography is a painless test that uses sound waves to create images of your heart. It provides your doctor with information about the size and shape of your heart and how well your heart's chambers and valves are working. This procedure takes approximately one hour. There are no restrictions for this procedure. You may get an IV, if needed, to receive an ultrasound enhancing agent through to better visualize your heart.    Follow-Up: At [facility], you and your health needs are our priority.  As part of our continuing mission to provide you with exceptional heart care, we have created designated Provider Care Teams.  These Care Teams include your primary Cardiologist (physician) and Advanced Practice Providers (APPs -  Physician Assistants and Nurse Practitioners) who all work together to provide you with the care you need, when you need it.  We recommend signing up for the patient portal called "MyChart".  Sign up information is provided on this After Visit Summary.  MyChart is used to connect with patients for Virtual Visits (Telemedicine).  Patients are able to view lab/test results, encounter notes, upcoming appointments, etc.  Non-urgent messages can be sent to your provider as  well.   To learn more about what you can do with MyChart, go to [email or web address].    Your next appointment:   3 month(s)  The format for your next appointment:   In Person  Provider:    You may see Nelva Bush, MD or one of the following Advanced Practice Providers on your designated Care Team:    Murray Hodgkins, NP  Christell Faith, PA-C  Marrianne Mood, PA-C

## 2019-07-07 ENCOUNTER — Encounter: Payer: Self-pay | Admitting: Internal Medicine

## 2019-07-16 DIAGNOSIS — Z7902 Long term (current) use of antithrombotics/antiplatelets: Secondary | ICD-10-CM | POA: Diagnosis not present

## 2019-07-16 DIAGNOSIS — S32058D Other fracture of fifth lumbar vertebra, subsequent encounter for fracture with routine healing: Secondary | ICD-10-CM | POA: Diagnosis not present

## 2019-07-16 DIAGNOSIS — I2510 Atherosclerotic heart disease of native coronary artery without angina pectoris: Secondary | ICD-10-CM | POA: Diagnosis not present

## 2019-07-16 DIAGNOSIS — S32018D Other fracture of first lumbar vertebra, subsequent encounter for fracture with routine healing: Secondary | ICD-10-CM | POA: Diagnosis not present

## 2019-07-16 DIAGNOSIS — S2232XD Fracture of one rib, left side, subsequent encounter for fracture with routine healing: Secondary | ICD-10-CM | POA: Diagnosis not present

## 2019-07-16 DIAGNOSIS — I509 Heart failure, unspecified: Secondary | ICD-10-CM | POA: Diagnosis not present

## 2019-07-16 DIAGNOSIS — S0240FD Zygomatic fracture, left side, subsequent encounter for fracture with routine healing: Secondary | ICD-10-CM | POA: Diagnosis not present

## 2019-07-16 DIAGNOSIS — E785 Hyperlipidemia, unspecified: Secondary | ICD-10-CM | POA: Diagnosis not present

## 2019-07-16 DIAGNOSIS — S0232XD Fracture of orbital floor, left side, subsequent encounter for fracture with routine healing: Secondary | ICD-10-CM | POA: Diagnosis not present

## 2019-07-16 DIAGNOSIS — I110 Hypertensive heart disease with heart failure: Secondary | ICD-10-CM | POA: Diagnosis not present

## 2019-07-16 DIAGNOSIS — T24391A Burn of third degree of multiple sites of right lower limb, except ankle and foot, initial encounter: Secondary | ICD-10-CM | POA: Diagnosis not present

## 2019-07-17 DIAGNOSIS — S0232XD Fracture of orbital floor, left side, subsequent encounter for fracture with routine healing: Secondary | ICD-10-CM | POA: Diagnosis not present

## 2019-07-17 DIAGNOSIS — S32018D Other fracture of first lumbar vertebra, subsequent encounter for fracture with routine healing: Secondary | ICD-10-CM | POA: Diagnosis not present

## 2019-07-17 DIAGNOSIS — T24391A Burn of third degree of multiple sites of right lower limb, except ankle and foot, initial encounter: Secondary | ICD-10-CM | POA: Diagnosis not present

## 2019-07-17 DIAGNOSIS — S32058D Other fracture of fifth lumbar vertebra, subsequent encounter for fracture with routine healing: Secondary | ICD-10-CM | POA: Diagnosis not present

## 2019-07-17 DIAGNOSIS — S2232XD Fracture of one rib, left side, subsequent encounter for fracture with routine healing: Secondary | ICD-10-CM | POA: Diagnosis not present

## 2019-07-17 DIAGNOSIS — S0240FD Zygomatic fracture, left side, subsequent encounter for fracture with routine healing: Secondary | ICD-10-CM | POA: Diagnosis not present

## 2019-07-19 DIAGNOSIS — T24391D Burn of third degree of multiple sites of right lower limb, except ankle and foot, subsequent encounter: Secondary | ICD-10-CM | POA: Diagnosis not present

## 2019-07-19 DIAGNOSIS — M25661 Stiffness of right knee, not elsewhere classified: Secondary | ICD-10-CM | POA: Diagnosis not present

## 2019-07-19 DIAGNOSIS — L910 Hypertrophic scar: Secondary | ICD-10-CM | POA: Diagnosis not present

## 2019-07-20 DIAGNOSIS — S32058D Other fracture of fifth lumbar vertebra, subsequent encounter for fracture with routine healing: Secondary | ICD-10-CM | POA: Diagnosis not present

## 2019-07-20 DIAGNOSIS — T24391A Burn of third degree of multiple sites of right lower limb, except ankle and foot, initial encounter: Secondary | ICD-10-CM | POA: Diagnosis not present

## 2019-07-20 DIAGNOSIS — S0240FD Zygomatic fracture, left side, subsequent encounter for fracture with routine healing: Secondary | ICD-10-CM | POA: Diagnosis not present

## 2019-07-20 DIAGNOSIS — S0232XD Fracture of orbital floor, left side, subsequent encounter for fracture with routine healing: Secondary | ICD-10-CM | POA: Diagnosis not present

## 2019-07-20 DIAGNOSIS — S32018D Other fracture of first lumbar vertebra, subsequent encounter for fracture with routine healing: Secondary | ICD-10-CM | POA: Diagnosis not present

## 2019-07-20 DIAGNOSIS — S2232XD Fracture of one rib, left side, subsequent encounter for fracture with routine healing: Secondary | ICD-10-CM | POA: Diagnosis not present

## 2019-07-27 DIAGNOSIS — I252 Old myocardial infarction: Secondary | ICD-10-CM | POA: Diagnosis not present

## 2019-07-27 DIAGNOSIS — I10 Essential (primary) hypertension: Secondary | ICD-10-CM | POA: Diagnosis not present

## 2019-07-27 DIAGNOSIS — Z8249 Family history of ischemic heart disease and other diseases of the circulatory system: Secondary | ICD-10-CM | POA: Diagnosis not present

## 2019-08-02 DIAGNOSIS — R600 Localized edema: Secondary | ICD-10-CM | POA: Diagnosis not present

## 2019-08-02 DIAGNOSIS — T3110 Burns involving 10-19% of body surface with 0% to 9% third degree burns: Secondary | ICD-10-CM | POA: Diagnosis not present

## 2019-08-02 DIAGNOSIS — T24391S Burn of third degree of multiple sites of right lower limb, except ankle and foot, sequela: Secondary | ICD-10-CM | POA: Diagnosis not present

## 2019-08-02 DIAGNOSIS — T24391D Burn of third degree of multiple sites of right lower limb, except ankle and foot, subsequent encounter: Secondary | ICD-10-CM | POA: Diagnosis not present

## 2019-08-02 DIAGNOSIS — I872 Venous insufficiency (chronic) (peripheral): Secondary | ICD-10-CM | POA: Diagnosis not present

## 2019-08-10 ENCOUNTER — Ambulatory Visit (INDEPENDENT_AMBULATORY_CARE_PROVIDER_SITE_OTHER): Payer: Medicare Other

## 2019-08-10 ENCOUNTER — Other Ambulatory Visit: Payer: Self-pay

## 2019-08-10 DIAGNOSIS — I5022 Chronic systolic (congestive) heart failure: Secondary | ICD-10-CM

## 2019-08-10 MED ORDER — PERFLUTREN LIPID MICROSPHERE
1.0000 mL | INTRAVENOUS | Status: AC | PRN
  Administered 2019-08-10: 2 mL via INTRAVENOUS

## 2019-08-16 DIAGNOSIS — T24391D Burn of third degree of multiple sites of right lower limb, except ankle and foot, subsequent encounter: Secondary | ICD-10-CM | POA: Diagnosis not present

## 2019-08-16 DIAGNOSIS — L910 Hypertrophic scar: Secondary | ICD-10-CM | POA: Diagnosis not present

## 2019-08-24 ENCOUNTER — Telehealth: Payer: Self-pay | Admitting: Family Medicine

## 2019-08-24 MED ORDER — TAMSULOSIN HCL 0.4 MG PO CAPS: 0.4000 mg | ORAL_CAPSULE | Freq: Every day | ORAL | 0 refills | Status: DC

## 2019-08-24 NOTE — Telephone Encounter (Signed)
30 days given follow up with dennis.

## 2019-08-24 NOTE — Telephone Encounter (Signed)
Pt called in to request a Rx or refill  for tamsulosin (FLOMAX) 0.4 MG CAPS capsule. Pt says that he was given this medication at a recent hospital visit and told to continue medication.    Pharmacy:  [id number] - [place], [place] Phone:  [phone]  Fax:  [phone]

## 2019-08-28 DIAGNOSIS — T24391S Burn of third degree of multiple sites of right lower limb, except ankle and foot, sequela: Secondary | ICD-10-CM | POA: Diagnosis not present

## 2019-08-28 DIAGNOSIS — L905 Scar conditions and fibrosis of skin: Secondary | ICD-10-CM | POA: Diagnosis not present

## 2019-09-07 DIAGNOSIS — Z7982 Long term (current) use of aspirin: Secondary | ICD-10-CM | POA: Diagnosis not present

## 2019-09-07 DIAGNOSIS — I998 Other disorder of circulatory system: Secondary | ICD-10-CM | POA: Diagnosis not present

## 2019-09-07 DIAGNOSIS — Z7902 Long term (current) use of antithrombotics/antiplatelets: Secondary | ICD-10-CM | POA: Diagnosis not present

## 2019-09-07 DIAGNOSIS — R600 Localized edema: Secondary | ICD-10-CM | POA: Diagnosis not present

## 2019-09-07 DIAGNOSIS — Z79899 Other long term (current) drug therapy: Secondary | ICD-10-CM | POA: Diagnosis not present

## 2019-09-07 DIAGNOSIS — L910 Hypertrophic scar: Secondary | ICD-10-CM | POA: Insufficient documentation

## 2019-09-07 DIAGNOSIS — T24331A Burn of third degree of right lower leg, initial encounter: Secondary | ICD-10-CM | POA: Diagnosis not present

## 2019-09-07 DIAGNOSIS — T3111 Burns involving 10-19% of body surface with 10-19% third degree burns: Secondary | ICD-10-CM | POA: Diagnosis not present

## 2019-09-07 DIAGNOSIS — T24391D Burn of third degree of multiple sites of right lower limb, except ankle and foot, subsequent encounter: Secondary | ICD-10-CM | POA: Diagnosis not present

## 2019-09-07 DIAGNOSIS — Z9889 Other specified postprocedural states: Secondary | ICD-10-CM | POA: Diagnosis not present

## 2019-09-20 ENCOUNTER — Other Ambulatory Visit: Payer: Self-pay | Admitting: Physician Assistant
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
## 2019-10-09 ENCOUNTER — Telehealth: Payer: Self-pay

## 2019-10-09 NOTE — Telephone Encounter (Signed)
Copied from [place] [id number]. Topic: Clinical - COVID Pre-Screen >> Oct 09, 2019  4:26 PM Oneta Rack wrote: 1. To the best of your knowledge, have you been in close contact with anyone with a confirmed diagnosis of COVID 19?  no  If no - Proceed to next question; If yes - Schedule patient for a virtual visit  2. Have you had any one or more of the following: fever, chills, cough, shortness of breath or any flu-like symptoms?  no  If no - Proceed to next question; If yes - Schedule patient for a virtual visit  3. Have you been diagnosed with or have a previous diagnosis of COVID 19?  no  If no - Proceed to next question; If yes - Schedule patient for a virtual visit  4. I am going to go over a few other symptoms with you. Please let me know if you are experiencing any of the following: no  Ear, nose or throat discomfort  A sore throat  Headache  Muscle pain  Diarrhea  Loss of taste or smell  If no - Continue with scheduling process; If yes - Document in scheduling notes   Thank you for answering these questions. Please know we will ask you these questions or similar questions when you arrive for your appointment and again it's how we are keeping everyone safe. Also, to keep you safe, please use the provided hand sanitizer when you enter the building. [name], we are asking everyone in the building to wear a mask because they help [place] prevent the spread of germs.   Do you have a mask of your own, if not, we are happy to provide one for you. The last thing I want to go over with you is the no visitor guidelines. This means no one can attend the appointment with you unless you need physical assistance. I understand this may be different from your past appointments and I know this may be difficult but please know if someone is driving you we are happy to call them for you once your appointment is over.
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
## 2019-10-11 ENCOUNTER — Encounter: Payer: Self-pay | Admitting: Physician Assistant

## 2019-10-11 ENCOUNTER — Telehealth: Payer: Self-pay | Admitting: Family Medicine

## 2019-10-11 ENCOUNTER — Ambulatory Visit: Payer: Medicare Other | Admitting: Family Medicine

## 2019-10-11 ENCOUNTER — Ambulatory Visit (INDEPENDENT_AMBULATORY_CARE_PROVIDER_SITE_OTHER): Payer: Medicare Other | Admitting: Physician Assistant

## 2019-10-11 ENCOUNTER — Other Ambulatory Visit: Payer: Self-pay

## 2019-10-11 VITALS — BP 160/100 | HR 60 | Temp 97.9°F | Wt 208.2 lb

## 2019-10-11 DIAGNOSIS — I10 Essential (primary) hypertension: Secondary | ICD-10-CM | POA: Diagnosis not present

## 2019-10-11 DIAGNOSIS — R591 Generalized enlarged lymph nodes: Secondary | ICD-10-CM | POA: Diagnosis not present

## 2019-10-11 DIAGNOSIS — Z114 Encounter for screening for human immunodeficiency virus [HIV]: Secondary | ICD-10-CM

## 2019-10-11 DIAGNOSIS — I25119 Atherosclerotic heart disease of native coronary artery with unspecified angina pectoris: Secondary | ICD-10-CM

## 2019-10-11 DIAGNOSIS — R1909 Other intra-abdominal and pelvic swelling, mass and lump: Secondary | ICD-10-CM | POA: Diagnosis not present

## 2019-10-11 MED ORDER — LOSARTAN POTASSIUM 25 MG PO TABS: 25.0000 mg | ORAL_TABLET | Freq: Every day | ORAL | 1 refills | Status: DC

## 2019-10-11 NOTE — Telephone Encounter (Signed)
Per [facility] for diagnosis given they will need an order for ultrasound lower extremity soft tissue non vascular. If left side [id number],YM right side [id number]

## 2019-10-11 NOTE — Progress Notes (Addendum)
Subjective:   Edward Tino. is a 69 y.o. male who presents for Medicare Annual/Subsequent preventive examination.  I connected with Leane Call today by telephone and verified that I am speaking with the correct person using two identifiers. Location patient: home Location provider: work Persons participating in the virtual visit: patient, provider.   I discussed the limitations, risks, security and privacy concerns of performing an evaluation and management service by telephone and the availability of in person appointments. I also discussed with the patient that there may be a patient responsible charge related to this service. The patient expressed understanding and verbally consented to this telephonic visit.    Interactive audio and video telecommunications were attempted between this provider and patient, however failed, due to patient having technical difficulties OR patient did not have access to video capability.  We continued and completed visit with audio only.   Review of Systems    N/A  Cardiac Risk Factors include: advanced age (>70men, >59 women);dyslipidemia;male gender;hypertension;sedentary lifestyle     Objective:    There were no vitals filed for this visit. There is no height or weight on file to calculate BMI.  Advanced Directives 10/15/2019 10/05/2018 09/20/2017 09/15/2017 03/13/2017 08/27/2016 08/27/2016  Does Patient Have a Medical Advance Directive? Yes No No No No No No  Type of Paramedic of [place];Living will - - - - - -  Copy of [place] in Chart? No - copy requested - - - - - -  Would patient like information on creating a medical advance directive? - - No - Patient declined No - Patient declined No - Patient declined No - Patient declined No - Patient declined    Current Medications (verified) Outpatient Encounter Medications as of 10/15/2019  Medication Sig   aspirin EC 81 MG tablet Take 1 tablet (81 mg total)  by mouth daily.   atorvastatin (LIPITOR) 80 MG tablet TAKE 1 TABLET EVERY DAY   carvedilol (COREG) 3.125 MG tablet Take 1 tablet (3.125 mg total) by mouth 2 (two) times daily.   clopidogrel (PLAVIX) 75 MG tablet Take 1 tablet (75 mg total) by mouth daily.   LORazepam (ATIVAN) 0.5 MG tablet Take 1 tablet (0.5 mg total) by mouth every 8 (eight) hours as needed for anxiety.   losartan (COZAAR) 25 MG tablet Take 1 tablet (25 mg total) by mouth daily.   mupirocin ointment (BACTROBAN) 2 % Apply 1 application topically as directed.   nitroGLYCERIN (NITROSTAT) 0.4 MG SL tablet Place 1 tablet (0.4 mg total) under the tongue every 5 (five) minutes as needed for chest pain. Maximum of 3 doses.   oxyCODONE (OXY IR/ROXICODONE) 5 MG immediate release tablet Take 5 mg by mouth every 6 (six) hours as needed.    Skin Protectants, Misc. (EUCERIN) cream Apply 1 application topically 3 (three) times daily as needed for dry skin.   tamsulosin (FLOMAX) 0.4 MG CAPS capsule TAKE 1 CAPSULE BY MOUTH EVERY DAY   docusate sodium (COLACE) 100 MG capsule Take 100 mg by mouth daily. (Patient not taking: Reported on 10/15/2019)   No facility-administered encounter medications on file as of 10/15/2019.    Allergies (verified) Patient has no known allergies.   History: Past Medical History:  Diagnosis Date   Anxiety    Apical mural thrombus    a. s/p 3 months of anticoagulation   BPH (benign prostatic hyperplasia)    Coronary artery disease    a. 03/2016: LM nl, LAD  thrombotic occlusion (3.0x38 Resolute Integrity DES), D1 70 (jailed), mild to mod nonobs LCx & RCA dzs; b. MV 7/18: Mod mid/apical antsept defect, no isch, EF 45%->Med Rx; c. 09/2017 Cath: LM nl, LAD patent stent, D1 70ost (jailed), LCX 50p, OM1 100, OM2 30, RCA 30p, RPAV 50, EF 50-55%->Med Rx.   HFrEF (heart failure with reduced ejection fraction) ([place])    a. TTE 2/18:  EF of 30-35% (in setting of MI); b. TTE 5/18: EF 35-40%; c. 10/2017 Echo: EF 40-45%,  mid-apicalantsept, ant, ap sev HK, Gr1 DD. Mild BAE.   Hyperlipidemia    Hypertension    Ischemic cardiomyopathy    a. TTE 2/18:  EF of 30-35% with LAD territory AK, likely mural thrombus at the LV apex; b. TTE 5/18: EF 35-40%, mid and apical anterior/anteroseptal AK, no evidence of LV thrombus, Gr1DD, mild BAE, normal RVSF; c. 10/2017 Echo: EF 40-45%, mid-apicalantsept, ant, ap sev HK, Gr1 DD. Mild BAE.   Myocardial infarction [facility])    Past Surgical History:  Procedure Laterality Date   CARDIAC CATHETERIZATION     COLONOSCOPY WITH PROPOFOL N/A 04/22/2017   Procedure: COLONOSCOPY WITH PROPOFOL;  Surgeon: Jonathon Bellows, MD;  [facility] ENDOSCOPY;  Service: Gastroenterology;  Laterality: N/A;   CORONARY ANGIOGRAPHY N/A 03/09/2016   Procedure: Coronary Angiography;  Surgeon: Nelva Bush, MD;  Location: [place] CV LAB;  Service: Cardiovascular;  Laterality: N/A;   CORONARY STENT INTERVENTION N/A 03/09/2016   Procedure: Coronary Stent Intervention;  Surgeon: Nelva Bush, MD;  Location: [place] CV LAB;  Service: Cardiovascular;  Laterality: N/A;   HERNIA REPAIR  2005   HYDROCELE EXCISION     LEFT HEART CATH AND CORONARY ANGIOGRAPHY Left 09/20/2017   Procedure: LEFT HEART CATH AND CORONARY ANGIOGRAPHY;  Surgeon: Nelva Bush, MD;  Location: [place] CV LAB;  Service: Cardiovascular;  Laterality: Left;   SPLENECTOMY     Family History  Problem Relation Age of Onset   Emphysema Mother    Macular degeneration Mother    Heart attack Mother    Heart attack Father    Social History   Socioeconomic History   Marital status: Married    Spouse name: Not on file   Number of children: 0   Years of education: Not on file   Highest education level: Bachelor's degree (e.g., BA, AB, BS)  Occupational History   Not on file  Tobacco Use   Smoking status: Former Smoker    Start date: 07/03/2007   Smokeless tobacco: Never Used   Tobacco comment: QUIT IN 2005  Vaping Use    Vaping Use: Never used  Substance and Sexual Activity   Alcohol use: Yes    Alcohol/week: 7.0 - 14.0 standard drinks    Types: 7 - 14 Cans of beer per week   Drug use: Yes    Types: Marijuana    Comment: smokes pot   Sexual activity: Not on file  Other Topics Concern   Not on file  Social History Narrative   Living with family, Independent at baseline   Social Determinants of Health   Financial Resource Strain: Low Risk    Difficulty of Paying Living Expenses: Not hard at all  Food Insecurity: No Food Insecurity   Worried About Charity fundraiser in the Last Year: Never true   Ran Out of Food in the Last Year: Never true  Transportation Needs: No Transportation Needs   Lack of Transportation (Medical): No   Lack of Transportation (Non-Medical):  No  Physical Activity: Inactive   Days of Exercise per Week: 0 days   Minutes of Exercise per Session: 0 min  Stress: Stress Concern Present   Feeling of Stress : Rather much  Social Connections: Moderately Isolated   Frequency of Communication with Friends and Family: More than three times a week   Frequency of Social Gatherings with Friends and Family: More than three times a week   Attends Religious Services: Never   Marine scientist or Organizations: No   Attends Music therapist: Never   Marital Status: Married    Tobacco Counseling Counseling given: Not Answered Comment: QUIT IN 2005   Clinical Intake:  Pre-visit preparation completed: Yes  Pain : No/denies pain     Nutritional Risks: None Diabetes: No  How often do you need to have someone help you when you read instructions, pamphlets, or other written materials from your doctor or pharmacy?: 1 - Never  Diabetic? No  Interpreter Needed?: No  Information entered by :: [facility], LPN   Activities of Daily Living In your present state of health, do you have any difficulty performing the following activities: 10/15/2019  Hearing? N    Vision? N  Difficulty concentrating or making decisions? N  Walking or climbing stairs? N  Dressing or bathing? N  Doing errands, shopping? N  Preparing Food and eating ? N  Using the Toilet? N  In the past six months, have you accidently leaked urine? N  Do you have problems with loss of bowel control? N  Managing your Medications? N  Managing your Finances? N  Housekeeping or managing your Housekeeping? N  Some recent data might be hidden    Patient Care Team: Chrismon, Vickki Muff, PA as PCP - General (Physician Assistant) End, Harrell Gave, MD as PCP - Cardiology (Cardiology) Paulene Floor as Physician Assistant (Physician Assistant) [name], MD [facility], [place] any recent [place] you may have received from other than [facility] providers in the past year (date may be approximate).     Assessment:   This is a routine wellness examination for Edward [name].  Hearing/Vision screen No exam data present  Dietary issues and exercise activities discussed: Current Exercise Habits: The patient does not participate in regular exercise at present, Exercise limited by: orthopedic condition(s)  Goals      Exercise 3x per week (30 min per time)     Recommend to exercise for 3 days a week for at least 30 minutes at a time.      Reduce alcohol intake     Recommend to cut back on alcohol intake to no more than 1 drink at a time and no more than 3 days a week.        Depression Screen PHQ 2/9 Scores 10/15/2019 07/21/2018 03/25/2017 09/09/2016 06/21/2016 12/16/2015  PHQ - 2 Score 0 0 1 1 0 0  PHQ- 9 Score - 0 1 3 2  -    Fall Risk Fall Risk  10/15/2019 07/21/2018 03/25/2017 06/21/2016  Falls in the past year? 0 0 No No  Number falls in past yr: 0 - - -  Injury with Fall? 0 - - -    Any stairs in or around the home? Yes  If so, are there any without handrails? No  Home free of loose throw rugs in walkways, pet beds, electrical cords, etc? Yes   Adequate lighting in your home to reduce risk of falls? Yes  ASSISTIVE DEVICES UTILIZED TO PREVENT FALLS:  Life alert? No  Use of a cane, walker or w/c? No  Grab bars in the bathroom? Yes  Shower chair or bench in shower? Yes  Elevated toilet seat or a handicapped toilet? No   Cognitive Function: Declined today.     6CIT Screen 10/05/2018  What Year? 0 points  What month? 0 points  What time? 0 points  Count back from 20 0 points  Months in reverse 0 points  Repeat phrase 0 points  Total Score 0    Immunizations Immunization History  Administered Date(s) Administered   HiB (PRP-T) 04/11/2019   Influenza,inj,quad, With Preservative 02/02/2019   Influenza-Unspecified 02/01/2018   Meningococcal Conjugate 04/11/2019   PFIZER SARS-COV-2 Vaccination 04/05/2019, 06/27/2019   Pneumococcal Conjugate-13 03/25/2017   Pneumococcal Polysaccharide-23 07/21/2018, 04/11/2019   Tdap 11/23/2012    TDAP status: Up to date Flu Vaccine status: Due fall 2021 Pneumococcal vaccine status: Up to date Covid-19 vaccine status: Completed vaccines  Qualifies for Shingles Vaccine? Yes   Zostavax completed No   Shingrix Completed?: No.    Education has been provided regarding the importance of this vaccine. Patient has been advised to call insurance company to determine out of pocket expense if they have not yet received this vaccine. Advised may also receive vaccine at local pharmacy or Health Dept. Verbalized acceptance and understanding.  Screening Tests Health Maintenance  Topic Date Due   COLON CANCER SCREENING ANNUAL FOBT  07/21/2019   INFLUENZA VACCINE  09/02/2019   COLONOSCOPY  04/23/2022   TETANUS/TDAP  11/24/2022   COVID-19 Vaccine  Completed   Hepatitis C Screening  Completed   PNA vac Low Risk Adult  Completed    Health Maintenance  Health Maintenance Due  Topic Date Due   COLON CANCER SCREENING ANNUAL FOBT  07/21/2019   INFLUENZA VACCINE  09/02/2019    Colorectal  cancer screening: Completed 04/22/17. Repeat every 5 years  Lung Cancer Screening: (Low Dose CT Chest recommended if Age 76-80 years, 30 pack-year currently smoking OR have quit w/in 15years.) does not qualify.    Additional Screening:  Hepatitis C Screening: Up to date  Vision Screening: Recommended annual ophthalmology exams for early detection of glaucoma and other disorders of the eye. Is the patient up to date with their annual eye exam?  Yes  Who is the provider or what is the name of the office in which the patient attends annual eye exams? [facility]. If pt is not established with a provider, would they like to be referred to a provider to establish care? No .   Dental Screening: Recommended annual dental exams for proper oral hygiene  Community Resource Referral / Chronic Care Management: CRR required this visit?  No   CCM required this visit?  No      Plan:     I have personally reviewed and noted the following in the patient's chart:   Medical and social history Use of alcohol, tobacco or illicit drugs  Current medications and supplements Functional ability and status Nutritional status Physical activity Advanced directives List of other physicians Hospitalizations, surgeries, and ER visits in previous 12 months Vitals Screenings to include cognitive, depression, and falls Referrals and appointments  In addition, I have reviewed and discussed with patient certain preventive protocols, quality metrics, and best practice recommendations. A written personalized care plan for preventive services as well as general preventive health recommendations were provided to patient.     Edward Garrison Hassan Buckler, LPN  10/15/2019   Nurse Notes: Pt needs a flu shot this fall.   Pt stated his BP was running high this morning. Pt just started back on Losartan 25 mg daily. Advised pt to check his BP cuff with another to make sure it was not his machine. Pt states he has increased  stress, alcohol intake and eating out the past week and thinks they may be contributing to his readings. Pt said he was nervous for his AWV and that may have increased his b/p too. Pt declined any abnormal s/s such as chest pain, SOB, tiredness, dizziness or arm numbness/tingling. Inquired about a f/u apt with PCP and pt declined. Pt to check BP x 1 week twice daily and record readings. Pt to cut back on salty foods and alcohol and will CB if readings are still elevated after a week. FYI!  Reviewed Nurse Health Advisor's note and agree with documentation.

## 2019-10-11 NOTE — Patient Instructions (Signed)

## 2019-10-12 LAB — CBC WITH DIFFERENTIAL/PLATELET
Basophils Absolute: 0.1 10*3/uL (ref 0.0–0.2)
Basos: 1 %
EOS (ABSOLUTE): 0.3 10*3/uL (ref 0.0–0.4)
Eos: 4 %
Hematocrit: 43.2 % (ref 37.5–51.0)
Hemoglobin: 14.5 g/dL (ref 13.0–17.7)
Immature Grans (Abs): 0 10*3/uL (ref 0.0–0.1)
Immature Granulocytes: 0 %
Lymphocytes Absolute: 2.4 10*3/uL (ref 0.7–3.1)
Lymphs: 34 %
MCH: 29.3 pg (ref 26.6–33.0)
MCHC: 33.6 g/dL (ref 31.5–35.7)
MCV: 87 fL (ref 79–97)
Monocytes Absolute: 1 10*3/uL — ABNORMAL HIGH (ref 0.1–0.9)
Monocytes: 15 %
Neutrophils Absolute: 3.4 10*3/uL (ref 1.4–7.0)
Neutrophils: 46 %
Platelets: 318 10*3/uL (ref 150–450)
RBC: 4.95 x10E6/uL (ref 4.14–5.80)
RDW: 15.9 % — ABNORMAL HIGH (ref 11.6–15.4)
WBC: 7.1 10*3/uL (ref 3.4–10.8)

## 2019-10-12 LAB — HIV ANTIBODY (ROUTINE TESTING W REFLEX): HIV Screen 4th Generation wRfx: NONREACTIVE

## 2019-10-12 NOTE — Telephone Encounter (Signed)
I put the order in, however the mass is above his bladder and I do not know if an extremity US will cover it.

## 2019-10-12 NOTE — Telephone Encounter (Signed)
Please review. Thanks!  

## 2019-10-15 ENCOUNTER — Ambulatory Visit (INDEPENDENT_AMBULATORY_CARE_PROVIDER_SITE_OTHER): Payer: Medicare Other

## 2019-10-15 ENCOUNTER — Other Ambulatory Visit: Payer: Self-pay

## 2019-10-15 DIAGNOSIS — Z0000 Encounter for general adult medical examination without abnormal findings: Secondary | ICD-10-CM | POA: Diagnosis not present

## 2019-10-15 NOTE — Patient Instructions (Signed)
Edward [name] , Thank you for taking time to come for your Medicare Wellness Visit. I appreciate your ongoing commitment to your health goals. Please review the following plan we discussed and let me know if I can assist you in the future.   Screening recommendations/referrals: Colonoscopy: Up to date, due 04/2022 Recommended yearly ophthalmology/optometry visit for glaucoma screening and checkup Recommended yearly dental visit for hygiene and checkup  Vaccinations: Influenza vaccine: Currently due Pneumococcal vaccine: Completed series Tdap vaccine: Up to date, due 11/2022 Shingles vaccine: Shingrix discussed. Please contact your pharmacy for coverage information.     Advanced directives: Please bring a copy of your POA (Power of Attorney) and/or Living Will to your next appointment.   Conditions/risks identified: Recommend to cut back on alcohol intake to no more than 1 drink at a time and no more than 3 days a week.   Next appointment: 10/20/20 @ 11:00 AM for an AWV. Declined scheduling a follow up with PCP at this time.   Preventive Care 68 Years and Older, Male Preventive care refers to lifestyle choices and visits with your health care provider that can promote health and wellness. What does preventive care include?  A yearly physical exam. This is also called an annual well check.  Dental exams once or twice a year.  Routine eye exams. Ask your health care provider how often you should have your eyes checked.  Personal lifestyle choices, including:  Daily care of your teeth and gums.  Regular physical activity.  Eating a healthy diet.  Avoiding tobacco and drug use.  Limiting alcohol use.  Practicing safe sex.  Taking low doses of aspirin every day.  Taking vitamin and mineral supplements as recommended by your health care provider. What happens during an annual well check? The services and screenings done by your health care provider during your annual well check will  depend on your age, overall health, lifestyle risk factors, and family history of disease. Counseling  Your health care provider may ask you questions about your:  Alcohol use.  Tobacco use.  Drug use.  Emotional well-being.  Home and relationship well-being.  Sexual activity.  Eating habits.  History of falls.  Memory and ability to understand (cognition).  Work and work Statistician. Screening  You may have the following tests or measurements:  Height, weight, and BMI.  Blood pressure.  Lipid and cholesterol levels. These may be checked every 5 years, or more frequently if you are over 51 years old.  Skin check.  Lung cancer screening. You may have this screening every year starting at age 29 if you have a 30-pack-year history of smoking and currently smoke or have quit within the past 15 years.  Fecal occult blood test (FOBT) of the stool. You may have this test every year starting at age 31.  Flexible sigmoidoscopy or colonoscopy. You may have a sigmoidoscopy every 5 years or a colonoscopy every 10 years starting at age 15.  Prostate cancer screening. Recommendations will vary depending on your family history and other risks.  Hepatitis C blood test.  Hepatitis B blood test.  Sexually transmitted disease (STD) testing.  Diabetes screening. This is done by checking your blood sugar (glucose) after you have not eaten for a while (fasting). You may have this done every 1-3 years.  Abdominal aortic aneurysm (AAA) screening. You may need this if you are a current or former smoker.  Osteoporosis. You may be screened starting at age 6 if you are at high  risk. Talk with your health care provider about your test results, treatment options, and if necessary, the need for more tests. Vaccines  Your health care provider may recommend certain vaccines, such as:  Influenza vaccine. This is recommended every year.  Tetanus, diphtheria, and acellular pertussis (Tdap,  Td) vaccine. You may need a Td booster every 10 years.  Zoster vaccine. You may need this after age 19.  Pneumococcal 13-valent conjugate (PCV13) vaccine. One dose is recommended after age 79.  Pneumococcal polysaccharide (PPSV23) vaccine. One dose is recommended after age 41. Talk to your health care provider about which screenings and vaccines you need and how often you need them. This information is not intended to replace advice given to you by your health care provider. Make sure you discuss any questions you have with your health care provider. Document Released: 02/14/2015 Document Revised: 10/08/2015 Document Reviewed: 11/19/2014 Elsevier Interactive Patient Education  2017 [place] Prevention in the Home Falls can cause injuries. They can happen to people of all ages. There are many things you can do to make your home safe and to help prevent falls. What can I do on the outside of my home?  Regularly fix the edges of walkways and driveways and fix any cracks.  Remove anything that might make you trip as you walk through a door, such as a raised step or threshold.  Trim any bushes or trees on the path to your home.  Use bright outdoor lighting.  Clear any walking paths of anything that might make someone trip, such as rocks or tools.  Regularly check to see if handrails are loose or broken. Make sure that both sides of any steps have handrails.  Any raised decks and porches should have guardrails on the edges.  Have any leaves, snow, or ice cleared regularly.  Use sand or salt on walking paths during winter.  Clean up any spills in your garage right away. This includes oil or grease spills. What can I do in the bathroom?  Use night lights.  Install grab bars by the toilet and in the tub and shower. Do not use towel bars as grab bars.  Use non-skid mats or decals in the tub or shower.  If you need to sit down in the shower, use a plastic, non-slip  stool.  Keep the floor dry. Clean up any water that spills on the floor as soon as it happens.  Remove soap buildup in the tub or shower regularly.  Attach bath mats securely with double-sided non-slip rug tape.  Do not have throw rugs and other things on the floor that can make you trip. What can I do in the bedroom?  Use night lights.  Make sure that you have a light by your bed that is easy to reach.  Do not use any sheets or blankets that are too big for your bed. They should not hang down onto the floor.  Have a firm chair that has side arms. You can use this for support while you get dressed.  Do not have throw rugs and other things on the floor that can make you trip. What can I do in the kitchen?  Clean up any spills right away.  Avoid walking on wet floors.  Keep items that you use a lot in easy-to-reach places.  If you need to reach something above you, use a strong step stool that has a grab bar.  Keep electrical cords out of the way.  Do not use floor polish or wax that makes floors slippery. If you must use wax, use non-skid floor wax.  Do not have throw rugs and other things on the floor that can make you trip. What can I do with my stairs?  Do not leave any items on the stairs.  Make sure that there are handrails on both sides of the stairs and use them. Fix handrails that are broken or loose. Make sure that handrails are as long as the stairways.  Check any carpeting to make sure that it is firmly attached to the stairs. Fix any carpet that is loose or worn.  Avoid having throw rugs at the top or bottom of the stairs. If you do have throw rugs, attach them to the floor with carpet tape.  Make sure that you have a light switch at the top of the stairs and the bottom of the stairs. If you do not have them, ask someone to add them for you. What else can I do to help prevent falls?  Wear shoes that:  Do not have high heels.  Have rubber bottoms.  Are  comfortable and fit you well.  Are closed at the toe. Do not wear sandals.  If you use a stepladder:  Make sure that it is fully opened. Do not climb a closed stepladder.  Make sure that both sides of the stepladder are locked into place.  Ask someone to hold it for you, if possible.  Clearly mark and make sure that you can see:  Any grab bars or handrails.  First and last steps.  Where the edge of each step is.  Use tools that help you move around (mobility aids) if they are needed. These include:  Canes.  Walkers.  Scooters.  Crutches.  Turn on the lights when you go into a dark area. Replace any light bulbs as soon as they burn out.  Set up your furniture so you have a clear path. Avoid moving your furniture around.  If any of your floors are uneven, fix them.  If there are any pets around you, be aware of where they are.  Review your medicines with your doctor. Some medicines can make you feel dizzy. This can increase your chance of falling. Ask your doctor what other things that you can do to help prevent falls. This information is not intended to replace advice given to you by your health care provider. Make sure you discuss any questions you have with your health care provider. Document Released: 11/14/2008 Document Revised: 06/26/2015 Document Reviewed: 02/22/2014 Elsevier Interactive Patient Education  2017 [employer].

## 2019-10-17 ENCOUNTER — Telehealth: Payer: Self-pay | Admitting: Internal Medicine

## 2019-10-17 DIAGNOSIS — I10 Essential (primary) hypertension: Secondary | ICD-10-CM

## 2019-10-17 MED ORDER — LOSARTAN POTASSIUM 25 MG PO TABS: 50.0000 mg | ORAL_TABLET | Freq: Every day | ORAL | 1 refills | Status: DC

## 2019-10-17 NOTE — Telephone Encounter (Signed)
Patient verbalized understanding of recommendations. He will take 2 (25 mg) tablets of losartan daily and continue to monitor BP until scheduled appointment.

## 2019-10-17 NOTE — Telephone Encounter (Signed)
Spoke with patient.  Notes he's been experiencing elevated BP's over the past weeks. At PCP last week, he was restarting on his losartan at 25 mg daily. He's continued to monitor BP since then and it is running as high as 180/101 and ranging in the 150-160's/88-90's. HR is 64-70's. Today he did feel some pressure in his head. 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 It was hard during the past year with COVID because of financial reasons and is eager to get busy.  Offered him an appointment with Dr End this afternoon but he is already booked with appointments and says it will make him about $1000. Encouraged him that it was ok and I will route message to Dr ENd for advice over the phone if able. He was wondering about restarting his ranolazine because he thinks that used to help him as well. His next appointment is scheduled for 10/31/19 with Dr End already.  Routing to Dr End.

## 2019-10-17 NOTE — Telephone Encounter (Signed)
Let's have Mr. Ranta increase losartan to 50 mg daily.  We will discuss restarting ranolazine and making other medication adjustments at his f/u appointment in a couple of weeks.  We will need to check a BMP at that time as well.  Nelva Bush, MD [facility]
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
## 2019-10-19 DIAGNOSIS — T24301S Burn of third degree of unspecified site of right lower limb, except ankle and foot, sequela: Secondary | ICD-10-CM | POA: Diagnosis not present

## 2019-10-19 DIAGNOSIS — T24391D Burn of third degree of multiple sites of right lower limb, except ankle and foot, subsequent encounter: Secondary | ICD-10-CM | POA: Diagnosis not present

## 2019-10-19 DIAGNOSIS — I998 Other disorder of circulatory system: Secondary | ICD-10-CM | POA: Diagnosis not present

## 2019-10-22 ENCOUNTER — Other Ambulatory Visit: Payer: Self-pay | Admitting: Physician Assistant

## 2019-10-22 ENCOUNTER — Ambulatory Visit
Admission: RE | Admit: 2019-10-22 | Discharge: 2019-10-22 | Disposition: A | Discharge status: Home / Self Care | Payer: Medicare Other | Source: Ambulatory Visit | Attending: Physician Assistant | Admitting: Physician Assistant

## 2019-10-22 ENCOUNTER — Other Ambulatory Visit: Payer: Self-pay | Admitting: Family Medicine

## 2019-10-22 DIAGNOSIS — R1900 Intra-abdominal and pelvic swelling, mass and lump, unspecified site: Secondary | ICD-10-CM | POA: Diagnosis not present

## 2019-10-22 DIAGNOSIS — R102 Pelvic and perineal pain: Secondary | ICD-10-CM | POA: Diagnosis not present

## 2019-10-22 DIAGNOSIS — R591 Generalized enlarged lymph nodes: Secondary | ICD-10-CM | POA: Insufficient documentation
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
## 2019-10-30 DIAGNOSIS — T24391A Burn of third degree of multiple sites of right lower limb, except ankle and foot, initial encounter: Secondary | ICD-10-CM | POA: Diagnosis not present

## 2019-10-30 DIAGNOSIS — L905 Scar conditions and fibrosis of skin: Secondary | ICD-10-CM | POA: Diagnosis not present

## 2019-10-31 ENCOUNTER — Other Ambulatory Visit: Payer: Self-pay

## 2019-10-31 ENCOUNTER — Ambulatory Visit (INDEPENDENT_AMBULATORY_CARE_PROVIDER_SITE_OTHER): Payer: Medicare Other | Admitting: Internal Medicine

## 2019-10-31 ENCOUNTER — Encounter: Payer: Self-pay | Admitting: Internal Medicine

## 2019-10-31 VITALS — BP 138/90 | HR 57 | Ht 70.0 in | Wt 207.1 lb

## 2019-10-31 DIAGNOSIS — I5022 Chronic systolic (congestive) heart failure: Secondary | ICD-10-CM

## 2019-10-31 DIAGNOSIS — I2510 Atherosclerotic heart disease of native coronary artery without angina pectoris: Secondary | ICD-10-CM | POA: Diagnosis not present

## 2019-10-31 DIAGNOSIS — Z79899 Other long term (current) drug therapy: Secondary | ICD-10-CM

## 2019-10-31 DIAGNOSIS — E785 Hyperlipidemia, unspecified: Secondary | ICD-10-CM

## 2019-10-31 DIAGNOSIS — I10 Essential (primary) hypertension: Secondary | ICD-10-CM

## 2019-10-31 MED ORDER — HYDROCHLOROTHIAZIDE 25 MG PO TABS: 25.0000 mg | ORAL_TABLET | Freq: Every day | ORAL | 1 refills | Status: DC

## 2019-10-31 NOTE — Patient Instructions (Signed)
Medication Instructions:  Your physician has recommended you make the following change in your medication:  1- STOP Aspirin. 2- START Hydrochlorothiazide 25 mg (1 tablet) by mouth once a day.  *If you need a refill on your cardiac medications before your next appointment, please call your pharmacy*  Lab Work: Your physician recommends that you return for lab work in: [place].  Your physician recommends that you return for lab work in: [place] at the [facility] (~11/30/19). - Please go to the [facility]. You will check in at the front desk to the right as you walk into the atrium. Valet Parking is offered if needed. - No appointment needed. You may go any day between 7 am and 6 pm.  If you have labs (blood work) drawn today and your tests are completely normal, you will receive your results only by: Marland Kitchen MyChart Message (if you have MyChart) OR . A paper copy in the mail If you have any lab test that is abnormal or we need to change your treatment, we will call you to review the results.  Testing/Procedures: none  Follow-Up: At [facility], you and your health needs are our priority.  As part of our continuing mission to provide you with exceptional heart care, we have created designated Provider Care Teams.  These Care Teams include your primary Cardiologist (physician) and Advanced Practice Providers (APPs -  Physician Assistants and Nurse Practitioners) who all work together to provide you with the care you need, when you need it.  We recommend signing up for the patient portal called "MyChart".  Sign up information is provided on this After Visit Summary.  MyChart is used to connect with patients for Virtual Visits (Telemedicine).  Patients are able to view lab/test results, encounter notes, upcoming appointments, etc.  Non-urgent messages can be sent to your provider as well.   To learn more about what you can do with MyChart, go to [email or web address].    Your next  appointment:   3 month(s)  The format for your next appointment:   In Person  Provider:   You may see Nelva Bush, MD or one of the following Advanced Practice Providers on your designated Care Team:    Murray Hodgkins, NP  Christell Faith, PA-C  Marrianne Mood, PA-C  Cadence [place], [place]

## 2019-11-01 LAB — BASIC METABOLIC PANEL
BUN/Creatinine Ratio: 23 (ref 10–24)
BUN: 16 mg/dL (ref 8–27)
CO2: 23 mmol/L (ref 20–29)
Calcium: 9.3 mg/dL (ref 8.6–10.2)
Chloride: 106 mmol/L (ref 96–106)
Creatinine, Ser: 0.69 mg/dL — ABNORMAL LOW (ref 0.76–1.27)
GFR calc Af Amer: 114 mL/min/{1.73_m2} (ref >59)
GFR calc non Af Amer: 98 mL/min/{1.73_m2} (ref >59)
Glucose: 101 mg/dL — ABNORMAL HIGH (ref 65–99)
Potassium: 4.6 mmol/L (ref 3.5–5.2)
Sodium: 140 mmol/L (ref 134–144)

## 2019-11-14 NOTE — Telephone Encounter (Signed)
error 

## 2019-11-15 DIAGNOSIS — T24391A Burn of third degree of multiple sites of right lower limb, except ankle and foot, initial encounter: Secondary | ICD-10-CM | POA: Diagnosis not present

## 2019-11-22 ENCOUNTER — Other Ambulatory Visit: Payer: Self-pay | Admitting: Internal Medicine

## 2019-11-22 MED ORDER — CARVEDILOL 3.125 MG PO TABS: 3.1250 mg | ORAL_TABLET | Freq: Two times a day (BID) | ORAL | 0 refills | Status: DC

## 2019-11-22 NOTE — Telephone Encounter (Signed)
Requested Prescriptions   Signed Prescriptions Disp Refills   carvedilol (COREG) 3.125 MG tablet 180 tablet 0    Sig: Take 1 tablet (3.125 mg total) by mouth 2 (two) times daily.    Authorizing Provider: END, CHRISTOPHER    Ordering User: Raelene Bott, Deonte Otting L

## 2019-11-22 NOTE — Telephone Encounter (Signed)
°*  STAT* If patient is at the pharmacy, call can be transferred to refill team.   1. Which medications need to be refilled? (please list name of each medication and dose if known)   Carvedilol 3.125 po BID   2. Which pharmacy/location (including street and city if local pharmacy) is medication to be sent to?   [facility] [address] [place]   3. Do they need a 30 day or 90 day supply? [place]

## 2019-11-30 ENCOUNTER — Other Ambulatory Visit: Payer: Self-pay | Admitting: Family Medicine

## 2019-11-30 DIAGNOSIS — F419 Anxiety disorder, unspecified: Secondary | ICD-10-CM

## 2019-11-30 MED ORDER — LORAZEPAM 0.5 MG PO TABS: 0.5000 mg | ORAL_TABLET | Freq: Three times a day (TID) | ORAL | 0 refills | Status: DC | PRN

## 2019-11-30 NOTE — Telephone Encounter (Signed)
Medication Refill - Medication: LORazepam (ATIVAN) 0.5 MG tablet    Has the patient contacted their pharmacy? No. (Agent: If no, request that the patient contact the pharmacy for the refill.) (Agent: If yes, when and what did the pharmacy advise?)  Preferred Pharmacy (with phone number or street name):  [id number] [place], [place] Phone:  [phone]  Fax:  [phone]       Agent: Please be advised that RX refills may take up to 3 business days. We ask that you follow-up with your pharmacy.

## 2019-12-07 ENCOUNTER — Other Ambulatory Visit: Payer: Self-pay | Admitting: *Deleted

## 2019-12-07 ENCOUNTER — Other Ambulatory Visit: Payer: Self-pay | Admitting: Family Medicine

## 2019-12-07 DIAGNOSIS — I10 Essential (primary) hypertension: Secondary | ICD-10-CM

## 2019-12-07 MED ORDER — LOSARTAN POTASSIUM 25 MG PO TABS: 50.0000 mg | ORAL_TABLET | Freq: Every day | ORAL | 0 refills | Status: DC

## 2019-12-07 MED ORDER — TAMSULOSIN HCL 0.4 MG PO CAPS: 0.4000 mg | ORAL_CAPSULE | Freq: Every day | ORAL | 1 refills | Status: DC

## 2019-12-07 MED ORDER — HYDROCHLOROTHIAZIDE 25 MG PO TABS: 25.0000 mg | ORAL_TABLET | Freq: Every day | ORAL | 0 refills | Status: DC

## 2019-12-07 MED ORDER — CARVEDILOL 3.125 MG PO TABS: 3.1250 mg | ORAL_TABLET | Freq: Two times a day (BID) | ORAL | 0 refills | Status: DC

## 2019-12-07 NOTE — Telephone Encounter (Signed)
[facility] faxed refill request for the following medications:  tamsulosin (FLOMAX) 0.4 MG CAPS capsule  Last Rx: 10/22/2019 [facility] day [facility] LOV: 10/11/2019 with Adriana Please advise. Thanks TNP

## 2019-12-11 ENCOUNTER — Other Ambulatory Visit: Payer: Self-pay

## 2019-12-11 DIAGNOSIS — I10 Essential (primary) hypertension: Secondary | ICD-10-CM

## 2019-12-11 MED ORDER — LOSARTAN POTASSIUM 25 MG PO TABS: 50.0000 mg | ORAL_TABLET | Freq: Every day | ORAL | 0 refills | Status: DC

## 2019-12-11 MED ORDER — HYDROCHLOROTHIAZIDE 25 MG PO TABS: 25.0000 mg | ORAL_TABLET | Freq: Every day | ORAL | 0 refills | Status: DC

## 2019-12-11 MED ORDER — CARVEDILOL 3.125 MG PO TABS: 3.1250 mg | ORAL_TABLET | Freq: Two times a day (BID) | ORAL | 0 refills | Status: DC

## 2019-12-14 DIAGNOSIS — T3110 Burns involving 10-19% of body surface with 0% to 9% third degree burns: Secondary | ICD-10-CM | POA: Diagnosis not present

## 2019-12-14 DIAGNOSIS — I998 Other disorder of circulatory system: Secondary | ICD-10-CM | POA: Diagnosis not present

## 2019-12-14 DIAGNOSIS — T24391S Burn of third degree of multiple sites of right lower limb, except ankle and foot, sequela: Secondary | ICD-10-CM | POA: Diagnosis not present

## 2019-12-14 DIAGNOSIS — T24391D Burn of third degree of multiple sites of right lower limb, except ankle and foot, subsequent encounter: Secondary | ICD-10-CM | POA: Diagnosis not present

## 2020-01-10 ENCOUNTER — Telehealth: Payer: Self-pay

## 2020-01-10 NOTE — Telephone Encounter (Signed)
Need form from [employer] to re-certify need for handicap placard.

## 2020-01-10 NOTE — Telephone Encounter (Signed)
Copied from [place] [phone]. Topic: General - Other >> Jan 03, 2020 11:36 AM Oneta Rack wrote: Reason for CRM: patient states handicap placard is expired and would like PCP to renew, please advise the patient regarding the process or status

## 2020-01-11 DIAGNOSIS — Z23 Encounter for immunization: Secondary | ICD-10-CM | POA: Diagnosis not present

## 2020-01-14 NOTE — Telephone Encounter (Signed)
Form given to [employer]

## 2020-01-15 NOTE — Telephone Encounter (Signed)
Patient advised form is ready for pick up

## 2020-01-21 ENCOUNTER — Telehealth: Payer: Self-pay | Admitting: Internal Medicine

## 2020-01-21 DIAGNOSIS — I5022 Chronic systolic (congestive) heart failure: Secondary | ICD-10-CM

## 2020-01-21 DIAGNOSIS — Z79899 Other long term (current) drug therapy: Secondary | ICD-10-CM

## 2020-01-21 DIAGNOSIS — I10 Essential (primary) hypertension: Secondary | ICD-10-CM

## 2020-01-21 DIAGNOSIS — I2510 Atherosclerotic heart disease of native coronary artery without angina pectoris: Secondary | ICD-10-CM

## 2020-01-21 NOTE — Telephone Encounter (Signed)
Patient calling in stating at last appointment he needed [facility] before appt on 12/29. Patient will try and go into the morning on 12/21  Please make sure the orders are placed

## 2020-01-21 NOTE — Telephone Encounter (Signed)
Lab order entered for [facility].

## 2020-01-24 ENCOUNTER — Other Ambulatory Visit
Admission: RE | Admit: 2020-01-24 | Discharge: 2020-01-24 | Disposition: A | Discharge status: Home / Self Care | Payer: Medicare Other | Attending: Internal Medicine | Admitting: Internal Medicine

## 2020-01-24 DIAGNOSIS — Z79899 Other long term (current) drug therapy: Secondary | ICD-10-CM | POA: Insufficient documentation

## 2020-01-24 DIAGNOSIS — I2510 Atherosclerotic heart disease of native coronary artery without angina pectoris: Secondary | ICD-10-CM | POA: Diagnosis not present

## 2020-01-24 DIAGNOSIS — I10 Essential (primary) hypertension: Secondary | ICD-10-CM | POA: Diagnosis not present

## 2020-01-24 DIAGNOSIS — I5022 Chronic systolic (congestive) heart failure: Secondary | ICD-10-CM | POA: Diagnosis not present

## 2020-01-24 LAB — BASIC METABOLIC PANEL
Anion gap: 9 (ref 5–15)
BUN: 19 mg/dL (ref 8–23)
CO2: 27 mmol/L (ref 22–32)
Calcium: 8.9 mg/dL (ref 8.9–10.3)
Chloride: 102 mmol/L (ref 98–111)
Creatinine, Ser: 0.76 mg/dL (ref 0.61–1.24)
GFR, Estimated: >60 mL/min (ref >60)
Glucose, Bld: 113 mg/dL — ABNORMAL HIGH (ref 70–99)
Potassium: 3.9 mmol/L (ref 3.5–5.1)
Sodium: 138 mmol/L (ref 135–145)

## 2020-01-28 ENCOUNTER — Ambulatory Visit: Payer: Self-pay | Admitting: Family Medicine

## 2020-01-28 ENCOUNTER — Other Ambulatory Visit: Payer: Self-pay

## 2020-01-28 ENCOUNTER — Emergency Department
Admission: EM | Admit: 2020-01-28 | Discharge: 2020-01-28 | Disposition: A | Discharge status: Home / Self Care | Payer: Medicare Other | Attending: Emergency Medicine | Admitting: Emergency Medicine

## 2020-01-28 ENCOUNTER — Emergency Department: Payer: Medicare Other

## 2020-01-28 ENCOUNTER — Encounter: Payer: Self-pay | Admitting: Emergency Medicine

## 2020-01-28 DIAGNOSIS — Z7901 Long term (current) use of anticoagulants: Secondary | ICD-10-CM | POA: Insufficient documentation

## 2020-01-28 DIAGNOSIS — I110 Hypertensive heart disease with heart failure: Secondary | ICD-10-CM | POA: Insufficient documentation

## 2020-01-28 DIAGNOSIS — R21 Rash and other nonspecific skin eruption: Secondary | ICD-10-CM | POA: Diagnosis not present

## 2020-01-28 DIAGNOSIS — I2510 Atherosclerotic heart disease of native coronary artery without angina pectoris: Secondary | ICD-10-CM | POA: Diagnosis not present

## 2020-01-28 DIAGNOSIS — Z79899 Other long term (current) drug therapy: Secondary | ICD-10-CM | POA: Diagnosis not present

## 2020-01-28 DIAGNOSIS — Z87891 Personal history of nicotine dependence: Secondary | ICD-10-CM | POA: Insufficient documentation

## 2020-01-28 DIAGNOSIS — K2100 Gastro-esophageal reflux disease with esophagitis, without bleeding: Secondary | ICD-10-CM | POA: Diagnosis not present

## 2020-01-28 DIAGNOSIS — R079 Chest pain, unspecified: Secondary | ICD-10-CM | POA: Diagnosis not present

## 2020-01-28 DIAGNOSIS — R0789 Other chest pain: Secondary | ICD-10-CM | POA: Diagnosis not present

## 2020-01-28 DIAGNOSIS — I5033 Acute on chronic diastolic (congestive) heart failure: Secondary | ICD-10-CM | POA: Diagnosis not present

## 2020-01-28 LAB — BASIC METABOLIC PANEL
Anion gap: 12 (ref 5–15)
BUN: 15 mg/dL (ref 8–23)
CO2: 26 mmol/L (ref 22–32)
Calcium: 9.3 mg/dL (ref 8.9–10.3)
Chloride: 99 mmol/L (ref 98–111)
Creatinine, Ser: 0.75 mg/dL (ref 0.61–1.24)
GFR, Estimated: >60 mL/min (ref >60)
Glucose, Bld: 99 mg/dL (ref 70–99)
Potassium: 3.3 mmol/L — ABNORMAL LOW (ref 3.5–5.1)
Sodium: 137 mmol/L (ref 135–145)

## 2020-01-28 LAB — CBC
HCT: 47.7 % (ref 39.0–52.0)
Hemoglobin: 16.4 g/dL (ref 13.0–17.0)
MCH: 32.9 pg (ref 26.0–34.0)
MCHC: 34.4 g/dL (ref 30.0–36.0)
MCV: 95.6 fL (ref 80.0–100.0)
Platelets: 318 10*3/uL (ref 150–400)
RBC: 4.99 MIL/uL (ref 4.22–5.81)
RDW: 14.2 % (ref 11.5–15.5)
WBC: 10.2 10*3/uL (ref 4.0–10.5)
nRBC: 0 % (ref 0.0–0.2)

## 2020-01-28 LAB — TROPONIN I (HIGH SENSITIVITY)
Troponin I (High Sensitivity): 7 ng/L (ref <18)
Troponin I (High Sensitivity): 5 ng/L (ref <18)

## 2020-01-28 MED ORDER — ALUM & MAG HYDROXIDE-SIMETH 200-200-20 MG/5ML PO SUSP
15.0000 mL | Freq: Once | ORAL | Status: AC
  Administered 2020-01-28: 19:00:00 15 mL via ORAL
  Filled 2020-01-28: qty 30

## 2020-01-28 MED ORDER — LIDOCAINE VISCOUS HCL 2 % MT SOLN
15.0000 mL | Freq: Once | OROMUCOSAL | Status: AC
  Administered 2020-01-28: 19:00:00 15 mL via ORAL
  Filled 2020-01-28: qty 15

## 2020-01-28 MED ORDER — PANTOPRAZOLE SODIUM 20 MG PO TBEC: 20.0000 mg | DELAYED_RELEASE_TABLET | Freq: Every day | ORAL | 1 refills | Status: DC

## 2020-01-28 NOTE — ED Notes (Signed)
Patient out of room in xray 

## 2020-01-28 NOTE — Telephone Encounter (Signed)
Agree with plan for ER evaluation for chest pains.

## 2020-01-28 NOTE — ED Triage Notes (Signed)
C/O CP intermittently x last 2-3 days.  Patient is AAOx3.  Skin warm and dry. NAD
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
## 2020-01-28 NOTE — Telephone Encounter (Signed)
Pt called in c/o chest pain "right over my heart and in the center of my chest" that has been going on for about a week now.    He has a history of having a heart attack.  He has an appt with his cardiologist in 2 days for a check up. While I was triaging him he decided it was best for him to go on to the ED.   "I was thinking about it before I called".   I agreed with him and that was where I was going to refer him anyway. So he is going to the ED now.  I did not ask which ED.  I forwarded my notes to Dortha Kern, PA-C for his reference at [facility].   Reason for Disposition . [1] Chest pain (or "angina") comes and goes AND [2] is happening more often (increasing in frequency) or getting worse (increasing in severity) (Exception: chest pains that last only a few seconds)    Has history of an MI  Answer Assessment - Initial Assessment Questions 1. LOCATION: "Where does it hurt?"       Hurting right over my heart and in the center of my chest for about a week now.   I've had a heart attack before.   I have an appt with cardiologist in 2 days.   But I'm worried about it. Maybe I have a hiatal hernia.   2. RADIATION: "Does the pain go anywhere else?" (e.g., into neck, jaw, arms, back)     No 3. ONSET: "When did the chest pain begin?" (Minutes, hours or days)      About a week now 4. PATTERN "Does the pain come and go, or has it been constant since it started?"  "Does it get worse with exertion?"      It comes and goes but it coming more often 5. DURATION: "How long does it last" (e.g., seconds, minutes, hours)     *No Answer* 6. SEVERITY: "How bad is the pain?"  (e.g., Scale 1-10; mild, moderate, or severe)    - MILD (1-3): doesn't interfere with normal activities     - MODERATE (4-7): interferes with normal activities or awakens from sleep    - SEVERE (8-10): excruciating pain, unable to do any normal activities       At this point pt was considering going on to the ED  and decided to go ahead so triage ended at this point. 7. CARDIAC RISK FACTORS: "Do you have any history of heart problems or risk factors for heart disease?" (e.g., angina, prior heart attack; diabetes, high blood pressure, high cholesterol, smoker, or strong family history of heart disease)     Had a heart attack before 8. PULMONARY RISK FACTORS: "Do you have any history of lung disease?"  (e.g., blood clots in lung, asthma, emphysema, birth control pills)     *No Answer* 9. CAUSE: "What do you think is causing the chest pain?"     Hopefully not another heart attack.   Maybe indigestion too. 10. OTHER SYMPTOMS: "Do you have any other symptoms?" (e.g., dizziness, nausea, vomiting, sweating, fever, difficulty breathing, cough)       Not asked 11. PREGNANCY: "Is there any chance you are pregnant?" "When was your last menstrual period?"       N/A  Protocols used: CHEST PAIN-A-AH

## 2020-01-30 ENCOUNTER — Other Ambulatory Visit: Payer: Self-pay

## 2020-01-30 ENCOUNTER — Encounter: Payer: Self-pay | Admitting: Internal Medicine

## 2020-01-30 ENCOUNTER — Ambulatory Visit (INDEPENDENT_AMBULATORY_CARE_PROVIDER_SITE_OTHER): Payer: Medicare Other | Admitting: Internal Medicine

## 2020-01-30 VITALS — BP 110/70 | HR 72 | Ht 70.0 in | Wt 215.0 lb

## 2020-01-30 DIAGNOSIS — K219 Gastro-esophageal reflux disease without esophagitis: Secondary | ICD-10-CM

## 2020-01-30 DIAGNOSIS — I2510 Atherosclerotic heart disease of native coronary artery without angina pectoris: Secondary | ICD-10-CM

## 2020-01-30 DIAGNOSIS — E785 Hyperlipidemia, unspecified: Secondary | ICD-10-CM

## 2020-01-30 DIAGNOSIS — I10 Essential (primary) hypertension: Secondary | ICD-10-CM | POA: Diagnosis not present

## 2020-01-30 DIAGNOSIS — I255 Ischemic cardiomyopathy: Secondary | ICD-10-CM | POA: Diagnosis not present

## 2020-01-30 NOTE — Patient Instructions (Signed)
Medication Instructions:   1. .Your physician recommends that you continue on your current medications as directed. Please refer to the Current Medication list given to you today.  *If you need a refill on your cardiac medications before your next appointment, please call your pharmacy*   Lab Work:   None   If you have labs (blood work) drawn today and your tests are completely normal, you will receive your results only by:  MyChart Message (if you have MyChart) OR  A paper copy in the mail If you have any lab test that is abnormal or we need to change your treatment, we will call you to review the results.   Testing/Procedures:  None   Follow-Up: At [facility], you and your health needs are our priority.  As part of our continuing mission to provide you with exceptional heart care, we have created designated Provider Care Teams.  These Care Teams include your primary Cardiologist (physician) and Advanced Practice Providers (APPs -  Physician Assistants and Nurse Practitioners) who all work together to provide you with the care you need, when you need it.  We recommend signing up for the patient portal called "MyChart".  Sign up information is provided on this After Visit Summary.  MyChart is used to connect with patients for Virtual Visits (Telemedicine).  Patients are able to view lab/test results, encounter notes, upcoming appointments, etc.  Non-urgent messages can be sent to your provider as well.   To learn more about what you can do with MyChart, go to [email or web address].    Your next appointment:   6 month(s)  The format for your next appointment:   In Person  Provider:   You may see Yvonne Kendall, MD or one of the following Advanced Practice Providers on your designated Care Team:    Nicolasa Ducking, NP  Eula Listen, PA-C  Marisue Ivan, PA-C  Cadence [place], [place]  Gillian Shields, NP

## 2020-01-31 ENCOUNTER — Encounter: Payer: Self-pay | Admitting: Internal Medicine

## 2020-02-14 DIAGNOSIS — T24391S Burn of third degree of multiple sites of right lower limb, except ankle and foot, sequela: Secondary | ICD-10-CM | POA: Diagnosis not present

## 2020-02-14 DIAGNOSIS — L910 Hypertrophic scar: Secondary | ICD-10-CM | POA: Diagnosis not present

## 2020-02-22 ENCOUNTER — Other Ambulatory Visit: Payer: Self-pay | Admitting: Internal Medicine

## 2020-03-13 ENCOUNTER — Telehealth: Payer: Self-pay | Admitting: Internal Medicine

## 2020-03-13 MED ORDER — ATORVASTATIN CALCIUM 80 MG PO TABS
80.0000 mg | ORAL_TABLET | Freq: Every day | ORAL | 0 refills | Status: DC
Start: 2020-03-13 — End: 2020-07-10

## 2020-03-13 MED ORDER — HYDROCHLOROTHIAZIDE 25 MG PO TABS: 25.0000 mg | ORAL_TABLET | Freq: Every day | ORAL | 0 refills | Status: DC

## 2020-03-13 MED ORDER — LOSARTAN POTASSIUM 50 MG PO TABS: 50.0000 mg | ORAL_TABLET | Freq: Every day | ORAL | 0 refills | Status: DC

## 2020-03-13 MED ORDER — CARVEDILOL 3.125 MG PO TABS: 3.1250 mg | ORAL_TABLET | Freq: Two times a day (BID) | ORAL | 0 refills | Status: DC

## 2020-03-13 NOTE — Telephone Encounter (Signed)
Requested Prescriptions   Signed Prescriptions Disp Refills   losartan (COZAAR) 50 MG tablet 90 tablet 0    Sig: Take 1 tablet (50 mg total) by mouth daily.    Authorizing Provider: END, CHRISTOPHER    Ordering User: NEWCOMER MCCLAIN, Aayansh Codispoti L   hydrochlorothiazide (HYDRODIURIL) 25 MG tablet 90 tablet 0    Sig: Take 1 tablet (25 mg total) by mouth daily.    Authorizing Provider: END, CHRISTOPHER    Ordering User: NEWCOMER MCCLAIN, Daryl Quiros L   carvedilol (COREG) 3.125 MG tablet 180 tablet 0    Sig: Take 1 tablet (3.125 mg total) by mouth 2 (two) times daily.    Authorizing Provider: END, CHRISTOPHER    Ordering User: NEWCOMER MCCLAIN, Siaosi Alter L   atorvastatin (LIPITOR) 80 MG tablet 90 tablet 0    Sig: Take 1 tablet (80 mg total) by mouth daily.    Authorizing Provider: END, CHRISTOPHER    Ordering User: Raelene Bott, Briann Sarchet L

## 2020-03-13 NOTE — Telephone Encounter (Signed)
*  STAT* If patient is at the pharmacy, call can be transferred to refill team.   1. Which medications need to be refilled? (please list name of each medication and dose if known)  Losartan 25 MG 2 tablets daily - would like 50 MG tablets if possible or needs 180 tablets of 25 MG ([facility] is only sending 90 and patient runs out too quickly) Atorvastatin 80 MG 1 tablet daily Hydrochlorothiazide 25 MG 1 tablet daily Carvedilol 3.125 MG 1 tablet 2 times daily   2. Which pharmacy/location (including street and city if local pharmacy) is medication to be sent to? [facility]   3. Do they need a 30 day or 90 day supply? 90 day

## 2020-03-17 ENCOUNTER — Ambulatory Visit (INDEPENDENT_AMBULATORY_CARE_PROVIDER_SITE_OTHER): Payer: Medicare Other | Admitting: Adult Health

## 2020-03-17 ENCOUNTER — Other Ambulatory Visit: Payer: Self-pay

## 2020-03-17 ENCOUNTER — Encounter: Payer: Self-pay | Admitting: Adult Health

## 2020-03-17 VITALS — BP 129/73 | HR 57 | Temp 98.4°F | Resp 16 | Wt 225.4 lb

## 2020-03-17 DIAGNOSIS — Z9889 Other specified postprocedural states: Secondary | ICD-10-CM | POA: Diagnosis not present

## 2020-03-17 DIAGNOSIS — Z86018 Personal history of other benign neoplasm: Secondary | ICD-10-CM | POA: Insufficient documentation

## 2020-03-17 DIAGNOSIS — X32XXXA Exposure to sunlight, initial encounter: Secondary | ICD-10-CM | POA: Insufficient documentation

## 2020-03-17 DIAGNOSIS — R21 Rash and other nonspecific skin eruption: Secondary | ICD-10-CM | POA: Diagnosis not present

## 2020-03-17 DIAGNOSIS — R109 Unspecified abdominal pain: Secondary | ICD-10-CM | POA: Diagnosis not present

## 2020-03-17 DIAGNOSIS — Z9081 Acquired absence of spleen: Secondary | ICD-10-CM | POA: Diagnosis not present

## 2020-03-17 DIAGNOSIS — R1032 Left lower quadrant pain: Secondary | ICD-10-CM | POA: Diagnosis not present

## 2020-03-17 MED ORDER — TRIAMCINOLONE ACETONIDE 0.1 % EX CREA: 1.0000 "application " | TOPICAL_CREAM | Freq: Two times a day (BID) | CUTANEOUS | 0 refills | Status: DC
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
## 2020-03-17 NOTE — Patient Instructions (Signed)
Actinic Keratosis An actinic keratosis is a precancerous growth on the skin. If there is more than one growth, the condition is called actinic keratoses. Actinic keratoses appear most often on areas of skin that get a lot of sun exposure, including the scalp, face, ears, lips, upper back, forearms, and the backs of the hands. If left untreated, these growths may develop into a skin cancer called squamous cell carcinoma. It is important to have all these growths checked by a health care provider to determine the best treatment approach. What are the causes? Actinic keratoses are caused by getting too much ultraviolet (UV) radiation from the sun or other UV light sources. What increases the risk? You are more likely to develop this condition if you:  Have light-colored skin and blue eyes.  Have blond or red hair.  Spend a lot of time in the sun.  Do not protect your skin from the sun when outdoors.  Are an older person. The risk of developing an actinic keratosis increases with age. What are the signs or symptoms? Actinic keratoses feel like scaly, rough spots of skin. Symptoms of this condition include growths that may:  Be as small as a pinhead or as big as a quarter.  Itch, hurt, or feel sensitive.  Be skin-colored, light tan, dark tan, pink, or a combination of any of these colors. In most cases, the growths become red.  Have a small piece of pink or gray skin (skin tag) growing from them. It may be easier to notice actinic keratoses by feeling them, rather than seeing them. Sometimes, actinic keratoses disappear, but many reappear a few days to a few weeks later.   How is this diagnosed? This condition is usually diagnosed with a physical exam.  A tissue sample may be removed from the actinic keratosis and examined under a microscope (biopsy). How is this treated? If needed, this condition may be treated by:  Scraping off the actinic keratosis (curettage).  Freezing the  actinic keratosis with liquid nitrogen (cryosurgery). This causes the growth to eventually fall off the skin.  Applying medicated creams or gels to destroy the cells in the growth.  Applying chemicals to the actinic keratosis to make the outer layers of skin peel off (chemical peel).  Using photodynamic therapy. In this procedure, medicated cream is applied to the actinic keratosis. This cream increases your skin's sensitivity to light. Then, a strong light is aimed at the actinic keratosis to destroy cells in the growth. Follow these instructions at home: Skin care  Apply cool, wet cloths (cool compresses) to the affected areas.  Do not scratch your skin.  Check your skin regularly for any growths, especially growths that: ? Start to itch or bleed. ? Change in size, shape, or color. Caring for the treated area  Keep the treated area clean and dry as told by your health care provider.  Do not apply any medicine, cream, or lotion to the treated area unless your health care provider tells you to do that.  Do not pick at blisters or try to break them open. This can cause infection and scarring.  If you have red or irritated skin after treatment, follow instructions from your health care provider about how to take care of the treated area. Make sure you: ? Wash your hands with soap and water before you change your bandage (dressing). If soap and water are not available, use hand sanitizer. ? Change your dressing as told by your health care provider.  If you have red or irritated skin after treatment, check your treated area every day for signs of infection. Check for: ? Redness, swelling, or pain. ? Fluid or blood. ? Warmth. ? Pus or a bad smell. General instructions  Take or apply over-the-counter and prescription medicines only as told by your health care provider.  Return to your normal activities as told by your health care provider. Ask your health care provider what activities  are safe for you.  Have a skin exam done every year by a health care provider who is a skin specialist (dermatologist).  Keep all follow-up visits as told by your health care provider. This is important. Lifestyle  Do not use any products that contain nicotine or tobacco, such as cigarettes and e-cigarettes. If you need help quitting, ask your health care provider.  Take steps to protect your skin from the sun. ? Try to avoid the sun between 10:00 a.m. and 4:00 p.m. This is when the UV light is the strongest. ? Use a sunscreen or sunblock with SPF 30 (sun protection factor 30) or greater. ? Apply sunscreen before you are exposed to sunlight and reapply as often as directed by the instructions on the sunscreen container. ? Always wear sunglasses that have UV protection, and always wear a hat and clothing to protect your skin from sunlight. ? When possible, avoid medicines that increase your sensitivity to sunlight. ? Do not use tanning beds or other indoor tanning devices. Contact a health care provider if:  You notice any changes or new growths on your skin.  You have swelling, pain, or more redness around your treated area.  You have fluid or blood coming from your treated area.  Your treated area feels warm to the touch.  You have pus or a bad smell coming from your treated area.  You have a fever.  You have a blister that becomes large and painful. Summary  An actinic keratosis is a precancerous growth on the skin. If there is more than one growth, the condition is called actinic keratoses. In some cases, if left untreated, these growths can develop into skin cancer.  Check your skin regularly for any growths, especially growths that start to itch or bleed, or change in size, shape, or color.  Take steps to protect your skin from the sun.  Contact a health care provider if you notice any changes or new growths on your skin.  Keep all follow-up visits as told by your health  care provider. This is important. This information is not intended to replace advice given to you by your health care provider. Make sure you discuss any questions you have with your health care provider. Document Revised: 05/31/2017 Document Reviewed: 05/31/2017 Elsevier Patient Education  2021 [place], Adult     A hernia happens when tissue inside your body pushes out through a weak spot in your belly muscles (abdominal wall). This makes a round lump (bulge). The lump may be:  In a scar from surgery that was done in your belly (incisional hernia).  Near your belly button (umbilical hernia).  In your groin (inguinal hernia). Your groin is the area where your leg meets your lower belly (abdomen). This kind of hernia could also be: ? In your scrotum, if you are male. ? In folds of skin around your vagina, if you are male.  In your upper thigh (femoral hernia).  Inside your belly (hiatal hernia). This happens when your stomach slides above the  muscle between your belly and your chest (diaphragm). If your hernia is small and it does not cause pain, you may not need treatment. If your hernia is large or it causes pain, you may need surgery. Follow these instructions at home: Activity  Avoid stretching or overusing (straining) the muscles near your hernia. Straining can happen when you: ? Lift something heavy. ? Poop (have a bowel movement).  Do not lift anything that is heavier than 10 lb (4.5 kg), or the limit that you are told, until your doctor says that it is safe.  Use the strength of your legs when you lift something heavy. Do not use only your back muscles to lift. General instructions  Do these things if told by your doctor so you do not have trouble pooping (constipation): ? Drink enough fluid to keep your pee (urine) pale yellow. ? Eat foods that are high in fiber. These include fresh fruits and vegetables, whole grains, and beans. ? Limit foods that are  high in fat and processed sugars. These include foods that are fried or sweet. ? Take medicine for trouble pooping.  When you cough, try to cough gently.  You may try to push your hernia in by very gently pressing on it when you are lying down. Do not try to force the bulge back in if it will not push in easily.  If you are overweight, work with your doctor to lose weight safely.  Do not use any products that have nicotine or tobacco in them. These include cigarettes and e-cigarettes. If you need help quitting, ask your doctor.  If you will be having surgery (hernia repair), watch your hernia for changes in shape, size, or color. Tell your doctor if you see any changes.  Take over-the-counter and prescription medicines only as told by your doctor.  Keep all follow-up visits as told by your doctor. Contact a doctor if:  You get new pain, swelling, or redness near your hernia.  You poop fewer times in a week than normal.  You have trouble pooping.  You have poop (stool) that is more dry than normal.  You have poop that is harder or larger than normal. Get help right away if:  You have a fever.  You have belly pain that gets worse.  You feel sick to your stomach (nauseous).  You throw up (vomit).  Your hernia cannot be pushed in by very gently pressing on it when you are lying down. Do not try to force the bulge back in if it will not push in easily.  Your hernia: ? Changes in shape or size. ? Changes color. ? Feels hard or it hurts when you touch it. These symptoms may represent a serious problem that is an emergency. Do not wait to see if the symptoms will go away. Get medical help right away. Call your local emergency services (911 in the [place] Summary  A hernia happens when tissue inside your body pushes out through a weak spot in the belly muscles. This creates a bulge.  If your hernia is small and it does not hurt, you may not need treatment. If your hernia is  large or it hurts, you may need surgery.  If you will be having surgery, watch your hernia for changes in shape, size, or color. Tell your doctor about any changes. This information is not intended to replace advice given to you by your health care provider. Make sure you discuss any questions you have with  your health care provider. Document Revised: 05/11/2018 Document Reviewed: 10/20/2016 Elsevier Patient Education  2021 [place]. Lipoma  A lipoma is a noncancerous (benign) tumor that is made up of fat cells. This is a very common type of soft-tissue growth. Lipomas are usually found under the skin (subcutaneous). They may occur in any tissue of the body that contains fat. Common areas for lipomas to appear include the back, arms, shoulders, buttocks, and thighs. Lipomas grow slowly, and they are usually painless. Most lipomas do not cause problems and do not require treatment. What are the causes? The cause of this condition is not known. What increases the risk? You are more likely to develop this condition if:  You are 84-49 years old.  You have a family history of lipomas. What are the signs or symptoms? A lipoma usually appears as a small, round bump under the skin. In most cases, the lump will:  Feel soft or rubbery.  Not cause pain or other symptoms. However, if a lipoma is located in an area where it pushes on nerves, it can become painful or cause other symptoms. How is this diagnosed? A lipoma can usually be diagnosed with a physical exam. You may also have tests to confirm the diagnosis and to rule out other conditions. Tests may include:  Imaging tests, such as a CT scan or an MRI.  Removal of a tissue sample to be looked at under a microscope (biopsy). How is this treated? Treatment for this condition depends on the size of the lipoma and whether it is causing any symptoms.  For small lipomas that are not causing problems, no treatment is needed.  If a lipoma  is bigger or it causes problems, surgery may be done to remove the lipoma. Lipomas can also be removed to improve appearance. Most often, the procedure is done after applying a medicine that numbs the area (local anesthetic).  Liposuction may be done to reduce the size of the lipoma before it is removed through surgery, or it may be done to remove the lipoma. Lipomas are removed with this method in order to limit incision size and scarring. A liposuction tube is inserted through a small incision into the lipoma, and the contents of the lipoma are removed through the tube with suction. Follow these instructions at home:  Watch your lipoma for any changes.  Keep all follow-up visits as told by your health care provider. This is important. Contact a health care provider if:  Your lipoma becomes larger or hard.  Your lipoma becomes painful, red, or increasingly swollen. These could be signs of infection or a more serious condition. Get help right away if:  You develop tingling or numbness in an area near the lipoma. This could indicate that the lipoma is causing nerve damage. Summary  A lipoma is a noncancerous tumor that is made up of fat cells.  Most lipomas do not cause problems and do not require treatment.  If a lipoma is bigger or it causes problems, surgery may be done to remove the lipoma.  Contact a health care provider if your lipoma becomes larger or hard, or if it becomes painful, red, or increasingly swollen. Pain, redness, and swelling could be signs of infection or a more serious condition. This information is not intended to replace advice given to you by your health care provider. Make sure you discuss any questions you have with your health care provider. Document Revised: 09/04/2018 Document Reviewed: 09/04/2018 Elsevier Patient Education  [place]. Abdominal Pain, Adult Many things can cause belly (abdominal) pain. Most times, belly pain is not dangerous. Many  cases of belly pain can be watched and treated at home. Sometimes, though, belly pain is serious. Your doctor will try to find the cause of your belly pain. Follow these instructions at home: Medicines  Take over-the-counter and prescription medicines only as told by your doctor.  Do not take medicines that help you poop (laxatives) unless told by your doctor. General instructions  Watch your belly pain for any changes.  Drink enough fluid to keep your pee (urine) pale yellow.  Keep all follow-up visits as told by your doctor. This is important.   Contact a doctor if:  Your belly pain changes or gets worse.  You are not hungry, or you lose weight without trying.  You are having trouble pooping (constipated) or have watery poop (diarrhea) for more than 2-3 days.  You have pain when you pee or poop.  Your belly pain wakes you up at night.  Your pain gets worse with meals, after eating, or with certain foods.  You are vomiting and cannot keep anything down.  You have a fever.  You have blood in your pee. Get help right away if:  Your pain does not go away as soon as your doctor says it should.  You cannot stop vomiting.  Your pain is only in areas of your belly, such as the right side or the left lower part of the belly.  You have bloody or black poop, or poop that looks like tar.  You have very bad pain, cramping, or bloating in your belly.  You have signs of not having enough fluid or water in your body (dehydration), such as: ? Dark pee, very little pee, or no pee. ? Cracked lips. ? Dry mouth. ? Sunken eyes. ? Sleepiness. ? Weakness.  You have trouble breathing or chest pain. Summary  Many cases of belly pain can be watched and treated at home.  Watch your belly pain for any changes.  Take over-the-counter and prescription medicines only as told by your doctor.  Contact a doctor if your belly pain changes or gets worse.  Get help right away if you have  very bad pain, cramping, or bloating in your belly. This information is not intended to replace advice given to you by your health care provider. Make sure you discuss any questions you have with your health care provider. Document Revised: 05/29/2018 Document Reviewed: 05/29/2018 Elsevier Patient Education  [place].

## 2020-03-18 LAB — CBC WITH DIFFERENTIAL/PLATELET
Basophils Absolute: 0.1 10*3/uL (ref 0.0–0.2)
Basos: 1 %
EOS (ABSOLUTE): 0.2 10*3/uL (ref 0.0–0.4)
Eos: 4 %
Hematocrit: 45.5 % (ref 37.5–51.0)
Hemoglobin: 15.2 g/dL (ref 13.0–17.7)
Immature Grans (Abs): 0 10*3/uL (ref 0.0–0.1)
Immature Granulocytes: 0 %
Lymphocytes Absolute: 1.8 10*3/uL (ref 0.7–3.1)
Lymphs: 28 %
MCH: 32.3 pg (ref 26.6–33.0)
MCHC: 33.4 g/dL (ref 31.5–35.7)
MCV: 97 fL (ref 79–97)
Monocytes Absolute: 0.9 10*3/uL (ref 0.1–0.9)
Monocytes: 14 %
Neutrophils Absolute: 3.4 10*3/uL (ref 1.4–7.0)
Neutrophils: 53 %
Platelets: 334 10*3/uL (ref 150–450)
RBC: 4.71 x10E6/uL (ref 4.14–5.80)
RDW: 12.9 % (ref 11.6–15.4)
WBC: 6.3 10*3/uL (ref 3.4–10.8)

## 2020-03-18 LAB — COMPREHENSIVE METABOLIC PANEL
ALT: 18 IU/L (ref 0–44)
AST: 20 IU/L (ref 0–40)
Albumin/Globulin Ratio: 1.7 (ref 1.2–2.2)
Albumin: 4.2 g/dL (ref 3.8–4.8)
Alkaline Phosphatase: 59 IU/L (ref 44–121)
BUN/Creatinine Ratio: 16 (ref 10–24)
BUN: 12 mg/dL (ref 8–27)
Bilirubin Total: 0.6 mg/dL (ref 0.0–1.2)
CO2: 24 mmol/L (ref 20–29)
Calcium: 9.1 mg/dL (ref 8.6–10.2)
Chloride: 103 mmol/L (ref 96–106)
Creatinine, Ser: 0.76 mg/dL (ref 0.76–1.27)
GFR calc Af Amer: 108 mL/min/{1.73_m2} (ref >59)
GFR calc non Af Amer: 94 mL/min/{1.73_m2} (ref >59)
Globulin, Total: 2.5 g/dL (ref 1.5–4.5)
Glucose: 115 mg/dL — ABNORMAL HIGH (ref 65–99)
Potassium: 3.9 mmol/L (ref 3.5–5.2)
Sodium: 142 mmol/L (ref 134–144)
Total Protein: 6.7 g/dL (ref 6.0–8.5)

## 2020-03-18 NOTE — Progress Notes (Signed)
Cbc within normal limits. CMP elevated glucose, do not believe he was fasting.  If he was can add on hemoglobin A1C.

## 2020-03-27 DIAGNOSIS — L280 Lichen simplex chronicus: Secondary | ICD-10-CM | POA: Diagnosis not present

## 2020-03-27 DIAGNOSIS — D485 Neoplasm of uncertain behavior of skin: Secondary | ICD-10-CM | POA: Diagnosis not present

## 2020-03-27 DIAGNOSIS — D225 Melanocytic nevi of trunk: Secondary | ICD-10-CM | POA: Diagnosis not present

## 2020-03-27 DIAGNOSIS — L578 Other skin changes due to chronic exposure to nonionizing radiation: Secondary | ICD-10-CM | POA: Diagnosis not present

## 2020-03-28 ENCOUNTER — Telehealth: Payer: Self-pay

## 2020-03-28 NOTE — Telephone Encounter (Signed)
Per [facility] [place]losartan is on backorder. Please send alternative if appropriate.  Thank you!

## 2020-03-28 NOTE — Telephone Encounter (Signed)
Please switch Mr. Gilardi to telmisartan 40 mg daily.  Thanks.  Nelva Bush, MD [facility]

## 2020-03-31 ENCOUNTER — Telehealth: Payer: Self-pay

## 2020-03-31 MED ORDER — TELMISARTAN 40 MG PO TABS: 40.0000 mg | ORAL_TABLET | Freq: Every day | ORAL | 2 refills | Status: DC

## 2020-03-31 NOTE — Addendum Note (Signed)
Addended by: Annia Belt on: 03/31/2020 11:08 AM   Modules accepted: Orders

## 2020-03-31 NOTE — Telephone Encounter (Signed)
New Rx sent in with note to replace losartan. 

## 2020-04-03 ENCOUNTER — Ambulatory Visit
Admission: RE | Admit: 2020-04-03 | Discharge: 2020-04-03 | Disposition: A | Discharge status: Home / Self Care | Payer: Medicare Other | Source: Ambulatory Visit | Attending: Adult Health | Admitting: Adult Health

## 2020-04-03 ENCOUNTER — Other Ambulatory Visit: Payer: Self-pay

## 2020-04-03 DIAGNOSIS — R109 Unspecified abdominal pain: Secondary | ICD-10-CM | POA: Diagnosis not present

## 2020-04-03 DIAGNOSIS — R1032 Left lower quadrant pain: Secondary | ICD-10-CM | POA: Insufficient documentation

## 2020-04-03 MED ORDER — IOHEXOL 300 MG/ML  SOLN
100.0000 mL | Freq: Once | INTRAMUSCULAR | Status: AC | PRN
  Administered 2020-04-03: 100 mL via INTRAVENOUS

## 2020-04-04 NOTE — Progress Notes (Signed)
New pulmonary nodule 7 mm in right lung base, stable nodule in the left lower lobe. Please refer to pulmonary of his choice for following this and repeat scanning as advised.   Left ventricle is suggestive of prior heart attack and from record review, is consistent with his history. Will have him follow up with his cardiologist Dr. Saunders Revel, unless any emergent symptoms then seek care immediately. Atherosclerotic calcification is present within  he non-aneurysmal abdominal aorta, without hemodynamically significant stenosis.  History of splenectomy.    Right sided enlargement of kidney up to the level of the bladder, questionable passed kidney done. Also questionable filling defect in the urinary bladder.Would advised urology evaluation if he has not had as radiology notes an obstructing mass can not ne excluded. Please refer to urologist he chooses.   Small gastric nodule. Constipation noted, increase fiver and liquids. Needs gastroenterologist if he does not have.    Right sided inguinal hernia, as well as small umbilical hernia. If areas are painful can refer for surgical evaluation.

## 2020-04-07 ENCOUNTER — Telehealth: Payer: Self-pay

## 2020-04-07 DIAGNOSIS — R911 Solitary pulmonary nodule: Secondary | ICD-10-CM

## 2020-04-07 NOTE — Telephone Encounter (Signed)
-----   Message from Doreen Beam, [place] sent at 04/04/2020 10:15 PM EST ----- New pulmonary nodule 7 mm in right lung base, stable nodule in the left lower lobe. Please refer to pulmonary of his choice for following this and repeat scanning as advised.   Left ventricle is suggestive of prior heart attack and from record review, is consistent with his history. Will have him follow up with his cardiologist Dr. Saunders Revel, unless any emergent symptoms then seek care immediately. Atherosclerotic calcification is present within  he non-aneurysmal abdominal aorta, without hemodynamically significant stenosis.  History of splenectomy.    Right sided enlargement of kidney up to the level of the bladder, questionable passed kidney done. Also questionable filling defect in the urinary bladder.Would advised urology evaluation if he has not had as radiology notes an obstructing mass can not ne excluded. Please refer to urologist he chooses.   Small gastric nodule. Constipation noted, increase fiver and liquids. Needs gastroenterologist if he does not have.    Right sided inguinal hernia, as well as small umbilical hernia. If areas are painful can refer for surgical evaluation.

## 2020-04-07 NOTE — Telephone Encounter (Signed)
Patient has viewed result on mychart. KW

## 2020-04-08 ENCOUNTER — Other Ambulatory Visit: Payer: Self-pay

## 2020-04-08 DIAGNOSIS — R9341 Abnormal radiologic findings on diagnostic imaging of renal pelvis, ureter, or bladder: Secondary | ICD-10-CM

## 2020-04-08 NOTE — Progress Notes (Signed)
Patient was advised of results on 04/07/2020. He would like to be referred to urology for further workup on bladder filling defect. Order was placed.   Wants to repeat non contrast chest CT at 6-12 months for new lung nodule.   Wants to wait on surgical referral for umbilical hernia. Will monitor symptoms, and if they worsen he will call the office to proceed with referral.

## 2020-04-21 ENCOUNTER — Other Ambulatory Visit: Payer: Self-pay

## 2020-04-21 ENCOUNTER — Ambulatory Visit (INDEPENDENT_AMBULATORY_CARE_PROVIDER_SITE_OTHER): Payer: Medicare Other | Admitting: Emergency Medicine

## 2020-04-21 ENCOUNTER — Encounter: Payer: Self-pay | Admitting: Emergency Medicine

## 2020-04-21 DIAGNOSIS — R918 Other nonspecific abnormal finding of lung field: Secondary | ICD-10-CM | POA: Diagnosis not present

## 2020-04-21 DIAGNOSIS — F17200 Nicotine dependence, unspecified, uncomplicated: Secondary | ICD-10-CM | POA: Diagnosis not present

## 2020-04-21 NOTE — Progress Notes (Signed)
Subjective:    Patient ID: Edward Heir., male    DOB: 1950/03/18, 70 y.o.   MRN: [id number]  HPI 70 year old former heavy smoker (90 pack years) with a history of CAD/PTCI, ischemic cardiomyopathy with an apical mural thrombus, hypertension, GERD/PUD. Rarely smokes THC, no more tobacco.  He developed mid-chest pain . No longer on GERD therapy. More recently he noticed some mid-left side abd discomfort, ? Hernia, so he had a CT abd as below. Denies any SOB, very active, able to do hills. No cough or wheeze.  CT abdomen/pelvis done 04/04/2020 reviewed by me, shows a new right basilar 7 mm pulmonary nodule, stable left lower lobe pulmonary nodule (compared with 2018).  CT-PA 08/27/2016 reviewed by me, showed no evidence pulmonary embolism, no significant enlarged mediastinal nodes, normal parenchyma, 7 mm left basal pulmonary nodule   Review of Systems As per HPI  Past Medical History:  Diagnosis Date  . Anxiety   . Apical mural thrombus    a. s/p 3 months of anticoagulation  . BPH (benign prostatic hyperplasia)   . Coronary artery disease    a. 03/2016: LM nl, LAD thrombotic occlusion (3.0x38 Resolute Integrity DES), D1 70 (jailed), mild to mod nonobs LCx & RCA dzs; b. MV 7/18: Mod mid/apical antsept defect, no isch, EF 45%->Med Rx; c. 09/2017 Cath: LM nl, LAD patent stent, D1 70ost (jailed), LCX 50p, OM1 100, OM2 30, RCA 30p, RPAV 50, EF 50-55%->Med Rx.  . HFrEF (heart failure with reduced ejection fraction) ([place])    a. TTE 2/18:  EF of 30-35% (in setting of MI); b. TTE 5/18: EF 35-40%; c. 10/2017 Echo: EF 40-45%, mid-apicalantsept, ant, ap sev HK, Gr1 DD. Mild BAE.  Marland Kitchen Hyperlipidemia   . Hypertension   . Ischemic cardiomyopathy    a. TTE 2/18:  EF of 30-35% with LAD territory AK, likely mural thrombus at the LV apex; b. TTE 5/18: EF 35-40%, mid and apical anterior/anteroseptal AK, no evidence of LV thrombus, Gr1DD, mild BAE, normal RVSF; c. 10/2017 Echo: EF 40-45%, mid-apicalantsept, ant,  ap sev HK, Gr1 DD. Mild BAE.  Marland Kitchen Myocardial infarction [facility])      Family History  Problem Relation Age of Onset  . Emphysema Mother   . Macular degeneration Mother   . Heart attack Mother   . Heart attack Father      Social History   Socioeconomic History  . Marital status: Married    Spouse name: Not on file  . Number of children: 0  . Years of education: Not on file  . Highest education level: Bachelor's degree (e.g., BA, AB, BS)  Occupational History  . Not on file  Tobacco Use  . Smoking status: Former Smoker    Packs/day: 3.00    Years: 30.00    Pack years: 90.00    Types: Cigarettes    Start date: 07/03/2007  . Smokeless tobacco: Never Used  . Tobacco comment: QUIT IN 2005  Vaping Use  . Vaping Use: Never used  Substance and Sexual Activity  . Alcohol use: Yes    Alcohol/week: 14.0 standard drinks    Types: 14 Cans of beer per week  . Drug use: Yes    Types: Marijuana    Comment: smokes pot  . Sexual activity: Not on file  Other Topics Concern  . Not on file  Social History Narrative   Living with family, Independent at baseline   Social Determinants of Health   Financial Resource Strain: Low  Risk   . Difficulty of Paying Living Expenses: Not hard at all  Food Insecurity: No Food Insecurity  . Worried About Charity fundraiser in the Last Year: Never true  . Ran Out of Food in the Last Year: Never true  Transportation Needs: No Transportation Needs  . Lack of Transportation (Medical): No  . Lack of Transportation (Non-Medical): No  Physical Activity: Inactive  . Days of Exercise per Week: 0 days  . Minutes of Exercise per Session: 0 min  Stress: Stress Concern Present  . Feeling of Stress : Rather much  Social Connections: Moderately Isolated  . Frequency of Communication with Friends and Family: More than three times a week  . Frequency of Social Gatherings with Friends and Family: More than three times a week  . Attends Religious Services: Never   . Active Member of Clubs or Organizations: No  . Attends Archivist Meetings: Never  . Marital Status: Married  Human resources officer Violence: Not At Risk  . Fear of Current or Ex-Partner: No  . Emotionally Abused: No  . Physically Abused: No  . 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 atorvastatin (LIPITOR) 80 MG tablet Take 1 tablet (80 mg total) by mouth daily. 90 tablet 0  . carvedilol (COREG) 3.125 MG tablet Take 1 tablet (3.125 mg total) by mouth 2 (two) times daily. 180 tablet 0  . clopidogrel (PLAVIX) 75 MG tablet TAKE 1 TABLET EVERY DAY 90 tablet 3  . hydrochlorothiazide (HYDRODIURIL) 25 MG tablet Take 1 tablet (25 mg total) by mouth daily. 90 tablet 0  . LORazepam (ATIVAN) 0.5 MG tablet Take 1 tablet (0.5 mg total) by mouth every 8 (eight) hours as needed for anxiety. 30 tablet 0  . mupirocin ointment (BACTROBAN) 2 % Apply 1 application topically as directed.    . nitroGLYCERIN (NITROSTAT) 0.4 MG SL tablet Place 1 tablet (0.4 mg total) under the tongue every 5 (five) minutes as needed for chest pain. Maximum of 3 doses. 90 tablet 2  . Skin Protectants, Misc. (EUCERIN) cream Apply 1 application topically 3 (three) times daily as needed for dry skin.    . tamsulosin (FLOMAX) 0.4 MG CAPS capsule Take 1 capsule (0.4 mg total) by mouth daily. 90 capsule 1  . telmisartan (MICARDIS) 40 MG tablet Take 1 tablet (40 mg total) by mouth daily. 90 tablet 2  . triamcinolone (KENALOG) 0.1 % Apply 1 application topically 2 (two) times daily. Apply thin lay twice daily as needed. Not for face or genitals. 45 each 0  . pantoprazole (PROTONIX) 20 MG tablet Take 1 tablet (20 mg total) by mouth daily. (Patient not taking: Reported on 04/21/2020) 30 tablet 1   No facility-administered  medications prior to visit.         Objective:   Physical Exam Vitals:   04/21/20 1107  BP: 124/80  Pulse: 61  Temp: 97.7 F (36.5 C)  TempSrc: Temporal  SpO2: 98%  Weight: 220 lb 6.4 oz (100 kg)  Height: 5\' 10"  (1.778 m)   Gen: Pleasant, well-nourished, in no distress,  normal affect  ENT: No lesions,  mouth clear,  oropharynx clear, no postnasal drip  Neck: No JVD, no stridor  Lungs: No use of accessory muscles, no crackles  or wheezing on normal respiration, no wheeze on forced expiration  Cardiovascular: RRR, heart sounds normal, no murmur or gallops, no peripheral edema  Musculoskeletal: No deformities, no cyanosis or clubbing  Neuro: alert, awake, non focal  Skin: Warm, no lesions or rash      Assessment & Plan:  Smoker Without dyspnea or other signs of COPD.  We can consider performing pulmonary function testing going forward if he develops symptoms or if we believe his nodules change such that he should be assessed for possible primary resection.  Pulmonary nodules/lesions, multiple Left lower lobe peripheral nodule about 7 mm, stable going back to 2018, benign.  The new right lower lobe nodule also 7 mm needs to be followed.  We will repeat his CT chest without contrast in 6 months.  If stable at that time we will space out his follow-up, needs to be at least 2 years total since the nodule is solid.     Baltazar Apo, MD, PhD 04/21/2020, 11:35 AM [facility] [phone] or if no answer before 7:00PM call [phone] For any issues after 7:00PM please call eLink [phone]

## 2020-04-21 NOTE — Assessment & Plan Note (Signed)
Without dyspnea or other signs of COPD.  We can consider performing pulmonary function testing going forward if he develops symptoms or if we believe his nodules change such that he should be assessed for possible primary resection.

## 2020-04-21 NOTE — Patient Instructions (Signed)
We will repeat your CT scan of the chest in September 2022 to follow for interval stability. Follow Dr. Lamonte Sakai in September to review your CT together.

## 2020-04-21 NOTE — Addendum Note (Signed)
Addended by: Gavin Potters R on: 04/21/2020 11:55 AM   Modules accepted: Orders

## 2020-04-21 NOTE — Assessment & Plan Note (Signed)
Left lower lobe peripheral nodule about 7 mm, stable going back to 2018, benign.  The new right lower lobe nodule also 7 mm needs to be followed.  We will repeat his CT chest without contrast in 6 months.  If stable at that time we will space out his follow-up, needs to be at least 2 years total since the nodule is solid.

## 2020-04-24 DIAGNOSIS — D235 Other benign neoplasm of skin of trunk: Secondary | ICD-10-CM | POA: Diagnosis not present

## 2020-04-24 DIAGNOSIS — D225 Melanocytic nevi of trunk: Secondary | ICD-10-CM | POA: Diagnosis not present

## 2020-05-02 NOTE — Telephone Encounter (Signed)
Error

## 2020-05-26 ENCOUNTER — Encounter: Payer: Self-pay | Admitting: Urology

## 2020-05-26 ENCOUNTER — Ambulatory Visit (INDEPENDENT_AMBULATORY_CARE_PROVIDER_SITE_OTHER): Payer: Medicare Other | Admitting: Urology

## 2020-05-26 ENCOUNTER — Other Ambulatory Visit: Payer: Self-pay

## 2020-05-26 VITALS — BP 147/91 | HR 58 | Ht 70.0 in | Wt 223.0 lb

## 2020-05-26 DIAGNOSIS — R9341 Abnormal radiologic findings on diagnostic imaging of renal pelvis, ureter, or bladder: Secondary | ICD-10-CM

## 2020-05-26 DIAGNOSIS — N1339 Other hydronephrosis: Secondary | ICD-10-CM

## 2020-05-26 LAB — MICROSCOPIC EXAMINATION
Bacteria, UA: NONE SEEN
Epithelial Cells (non renal): NONE SEEN /hpf (ref 0–10)

## 2020-05-26 LAB — URINALYSIS, COMPLETE
Bilirubin, UA: NEGATIVE
Glucose, UA: NEGATIVE
Ketones, UA: NEGATIVE
Leukocytes,UA: NEGATIVE
Nitrite, UA: NEGATIVE
Protein,UA: NEGATIVE
RBC, UA: NEGATIVE
Specific Gravity, UA: <1.005 — ABNORMAL LOW (ref 1.005–1.030)
Urobilinogen, Ur: 0.2 mg/dL (ref 0.2–1.0)
pH, UA: 6 (ref 5.0–7.5)

## 2020-05-26 LAB — BLADDER SCAN AMB NON-IMAGING

## 2020-05-26 NOTE — Progress Notes (Signed)
05/26/2020 9:16 AM   Edward [name] 1950/08/31 [id number]  Referring provider: Doreen Beam, [place] [address],  [place] [address]  Chief Complaint  Patient presents with  . Bladder filling defect    HPI: Patient had a CT scan recently and may have a right inguinal hernia recurrence.  He may have an umbilical hernia.  He was told he had an abnormality of his right kidney that might be a mass.  He voids every 1-2 hours.  Flow varies and is volume dependent.  He gets up 0-2 times at night.  No blood in urine or flank pain.  Patient had CT scan of abdomen pelvis with contrast April 04, 2020.  Patient has mild right hydro utero nephrosis to the level of the urinary bladder.  No stone identified.  Normal left kidney.  Patient has mild bladder wall thickening in the posterior bladder and may have a small bladder diverticulum.  Patient had right inguinal hernia and umbilical hernia.  No history of kidney stones bladder surgery or bladder infections     PMH: Past Medical History:  Diagnosis Date  . Anxiety   . Apical mural thrombus    a. s/p 3 months of anticoagulation  . BPH (benign prostatic hyperplasia)   . Coronary artery disease    a. 03/2016: LM nl, LAD thrombotic occlusion (3.0x38 Resolute Integrity DES), D1 70 (jailed), mild to mod nonobs LCx & RCA dzs; b. MV 7/18: Mod mid/apical antsept defect, no isch, EF 45%->Med Rx; c. 09/2017 Cath: LM nl, LAD patent stent, D1 70ost (jailed), LCX 50p, OM1 100, OM2 30, RCA 30p, RPAV 50, EF 50-55%->Med Rx.  . HFrEF (heart failure with reduced ejection fraction) ([place])    a. TTE 2/18:  EF of 30-35% (in setting of MI); b. TTE 5/18: EF 35-40%; c. 10/2017 Echo: EF 40-45%, mid-apicalantsept, ant, ap sev HK, Gr1 DD. Mild BAE.  Marland Kitchen Hyperlipidemia   . Hypertension   . Ischemic cardiomyopathy    a. TTE 2/18:  EF of 30-35% with LAD territory AK, likely mural thrombus at the LV apex; b. TTE 5/18: EF 35-40%, mid and apical  anterior/anteroseptal AK, no evidence of LV thrombus, Gr1DD, mild BAE, normal RVSF; c. 10/2017 Echo: EF 40-45%, mid-apicalantsept, ant, ap sev HK, Gr1 DD. Mild BAE.  Marland Kitchen Myocardial infarction [facility])     Surgical History: Past Surgical History:  Procedure Laterality Date  . CARDIAC CATHETERIZATION    . COLONOSCOPY WITH PROPOFOL N/A 04/22/2017   Procedure: COLONOSCOPY WITH PROPOFOL;  Surgeon: Jonathon Bellows, MD;  [facility] ENDOSCOPY;  Service: Gastroenterology;  Laterality: N/A;  . CORONARY ANGIOGRAPHY N/A 03/09/2016   Procedure: Coronary Angiography;  Surgeon: Nelva Bush, MD;  Location: [place] CV LAB;  Service: Cardiovascular;  Laterality: N/A;  . CORONARY STENT INTERVENTION N/A 03/09/2016   Procedure: Coronary Stent Intervention;  Surgeon: Nelva Bush, MD;  Location: [place] CV LAB;  Service: Cardiovascular;  Laterality: N/A;  . HERNIA REPAIR  2005  . HYDROCELE EXCISION    . LEFT HEART CATH AND CORONARY ANGIOGRAPHY Left 09/20/2017   Procedure: LEFT HEART CATH AND CORONARY ANGIOGRAPHY;  Surgeon: Nelva Bush, MD;  Location: [place] CV LAB;  Service: Cardiovascular;  Laterality: Left;  . SPLENECTOMY      Home Medications:  Allergies as of 05/26/2020   No Known Allergies     Medication List       Accurate as of May 26, 2020  9:16 AM. If you have  any questions, ask your nurse or doctor.        atorvastatin 80 MG tablet Commonly known as: LIPITOR Take 1 tablet (80 mg total) by mouth daily.   carvedilol 3.125 MG tablet Commonly known as: COREG Take 1 tablet (3.125 mg total) by mouth 2 (two) times daily.   clopidogrel 75 MG tablet Commonly known as: PLAVIX TAKE 1 TABLET EVERY DAY   eucerin cream Apply 1 application topically 3 (three) times daily as needed for dry skin.   hydrochlorothiazide 25 MG tablet Commonly known as: HYDRODIURIL Take 1 tablet (25 mg total) by mouth daily.   LORazepam 0.5 MG tablet Commonly known as: ATIVAN Take 1 tablet  (0.5 mg total) by mouth every 8 (eight) hours as needed for anxiety.   mupirocin ointment 2 % Commonly known as: BACTROBAN Apply 1 application topically as directed.   nitroGLYCERIN 0.4 MG SL tablet Commonly known as: NITROSTAT Place 1 tablet (0.4 mg total) under the tongue every 5 (five) minutes as needed for chest pain. Maximum of 3 doses.   pantoprazole 20 MG tablet Commonly known as: Protonix Take 1 tablet (20 mg total) by mouth daily.   tamsulosin 0.4 MG Caps capsule Commonly known as: FLOMAX Take 1 capsule (0.4 mg total) by mouth daily.   telmisartan 40 MG tablet Commonly known as: MICARDIS Take 1 tablet (40 mg total) by mouth daily.   triamcinolone cream 0.1 % Commonly known as: KENALOG Apply 1 application topically 2 (two) times daily. Apply thin lay twice daily as needed. Not for face or genitals.       Allergies: No Known Allergies  Family History: Family History  Problem Relation Age of Onset  . Emphysema Mother   . Macular degeneration Mother   . Heart attack Mother   . Heart attack Father     Social History:  reports that he has quit smoking. His smoking use included cigarettes. He started smoking about 12 years ago. He has a 90.00 pack-year smoking history. He has never used smokeless tobacco. He reports current alcohol use of about 14.0 standard drinks of alcohol per week. He reports current drug use. Drug: Marijuana.  ROS:                                        Physical Exam: There were no vitals taken for this visit.  Constitutional:  Alert and oriented, No acute distress. HEENT: [place] AT, moist mucus membranes.  Trachea midline, no masses. Cardiovascular: No clubbing, cyanosis, or edema. Respiratory: Normal respiratory effort, no increased work of breathing. GI: Abdomen is soft, nontender, nondistended, no abdominal masses GU: No CVA tenderness.  Patient may have small right inguinal hernia palpable at external ring.  Male  genitalia normal.  50 g benign prostate. Skin: No rashes, bruises or suspicious lesions. Lymph: No cervical or inguinal adenopathy. Neurologic: Grossly intact, no focal deficits, moving all 4 extremities. Psychiatric: Normal mood and affect.  Laboratory Data: Lab Results  Component Value Date   WBC 6.3 03/17/2020   HGB 15.2 03/17/2020   HCT 45.5 03/17/2020   MCV 97 03/17/2020   PLT 334 03/17/2020    Lab Results  Component Value Date   CREATININE 0.76 03/17/2020    No results found for: PSA  No results found for: TESTOSTERONE  Lab Results  Component Value Date   HGBA1C 5.5 08/27/2016    Urinalysis  Component Value Date/Time   BILIRUBINUR neg 12/10/2014 0924   PROTEINUR neg 12/10/2014 0924   UROBILINOGEN 0.2 12/10/2014 0924   NITRITE neg 12/10/2014 0924   LEUKOCYTESUR Negative 12/10/2014 0924    Pertinent Imaging: Urine reviewed.  Chart reviewed.  CT scan reviewed.  Assessment & Plan: Patient has minimal voiding dysfunction.  I reviewed the CT scan I thought the hydroureter was quite mild and not much different from the patient's left.  There was a little bit of elevation or thickening at the floor the bladder.  Picture was drawn.  I will have the patient see my partner Dr Diamantina [place] for cystoscopy since he may need retrograde ureterogram's in the future and/or ureteroscopy.  Patient has a smoking history and understands that we need to make certain he does not have early bladder cancer  1. Bladder filling defect  - Urinalysis, Complete - Bladder Scan (Post Void Residual) in office   No follow-ups on file.  Reece Packer, MD  [facility] [address], [place], [place] [address] [phone]

## 2020-06-11 ENCOUNTER — Other Ambulatory Visit: Payer: Self-pay

## 2020-06-11 ENCOUNTER — Ambulatory Visit (INDEPENDENT_AMBULATORY_CARE_PROVIDER_SITE_OTHER): Payer: Medicare Other | Admitting: Internal Medicine

## 2020-06-11 ENCOUNTER — Encounter: Payer: Self-pay | Admitting: Internal Medicine

## 2020-06-11 ENCOUNTER — Other Ambulatory Visit: Payer: Self-pay | Admitting: Internal Medicine

## 2020-06-11 VITALS — BP 120/86 | HR 55 | Ht 69.0 in | Wt 221.0 lb

## 2020-06-11 DIAGNOSIS — I2510 Atherosclerotic heart disease of native coronary artery without angina pectoris: Secondary | ICD-10-CM

## 2020-06-11 DIAGNOSIS — E785 Hyperlipidemia, unspecified: Secondary | ICD-10-CM | POA: Diagnosis not present

## 2020-06-11 DIAGNOSIS — I255 Ischemic cardiomyopathy: Secondary | ICD-10-CM

## 2020-06-11 DIAGNOSIS — I10 Essential (primary) hypertension: Secondary | ICD-10-CM

## 2020-06-11 NOTE — Patient Instructions (Signed)
Medication Instructions:  - Your physician recommends that you continue on your current medications as directed. Please refer to the Current Medication list given to you today.  *If you need a refill on your cardiac medications before your next appointment, please call your pharmacy*   Lab Work: - Your physician recommends that you return for a FASTING lipid profile: in the next 1-2 weeks  [place] at [facility] 1st desk on the right to check in, past the screening table Lab hours: Monday- Friday (7:30 am- 5:30 pm)    If you have labs (blood work) drawn today and your tests are completely normal, you will receive your results only by: Marland Kitchen MyChart Message (if you have MyChart) OR . A paper copy in the mail If you have any lab test that is abnormal or we need to change your treatment, we will call you to review the results.   Testing/Procedures: - none ordered   Follow-Up: At [facility], you and your health needs are our priority.  As part of our continuing mission to provide you with exceptional heart care, we have created designated Provider Care Teams.  These Care Teams include your primary Cardiologist (physician) and Advanced Practice Providers (APPs -  Physician Assistants and Nurse Practitioners) who all work together to provide you with the care you need, when you need it.  We recommend signing up for the patient portal called "MyChart".  Sign up information is provided on this After Visit Summary.  MyChart is used to connect with patients for Virtual Visits (Telemedicine).  Patients are able to view lab/test results, encounter notes, upcoming appointments, etc.  Non-urgent messages can be sent to your provider as well.   To learn more about what you can do with MyChart, go to [email or web address].    Your next appointment:   6 month(s)  The format for your next appointment:   In Person  Provider:   You may see Nelva Bush, MD or one of the following  Advanced Practice Providers on your designated Care Team:    Murray Hodgkins, NP  Christell Faith, PA-C  Marrianne Mood, PA-C  [facility] [employer], [place]  Laurann Montana, NP    Other Instructions n/a
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
## 2020-06-13 ENCOUNTER — Encounter: Payer: Self-pay | Admitting: Internal Medicine

## 2020-06-13 ENCOUNTER — Other Ambulatory Visit: Payer: Self-pay | Admitting: Family Medicine

## 2020-06-13 NOTE — Telephone Encounter (Signed)
Requested medications are due for refill today yes  Requested medications are on the active medication list yes  Last refill 03/13/20  Last visit this med/dx last addressed 07/2018  Future visit scheduled 10/2020  Notes to clinic Do not see this dx/med addressed since 2020, please assess. Future visit 10/2020.

## 2020-06-13 NOTE — Telephone Encounter (Signed)
Please review. Ok to refill? Thanks!  °

## 2020-06-18 ENCOUNTER — Ambulatory Visit (INDEPENDENT_AMBULATORY_CARE_PROVIDER_SITE_OTHER): Payer: Medicare Other | Admitting: Urology

## 2020-06-18 ENCOUNTER — Encounter: Payer: Self-pay | Admitting: Urology

## 2020-06-18 ENCOUNTER — Other Ambulatory Visit: Payer: Self-pay

## 2020-06-18 VITALS — BP 153/89 | Ht 69.0 in | Wt 221.0 lb

## 2020-06-18 DIAGNOSIS — N329 Bladder disorder, unspecified: Secondary | ICD-10-CM

## 2020-06-18 MED ORDER — LIDOCAINE HCL URETHRAL/MUCOSAL 2 % EX GEL
1.0000 "application " | Freq: Once | CUTANEOUS | Status: AC
  Administered 2020-06-18: 1 via URETHRAL

## 2020-06-18 NOTE — Progress Notes (Signed)
Cystoscopy Procedure Note:  Indication: Possible bladder filling defect on CT  After informed consent and discussion of the procedure and its risks, Edward [name]. was positioned and prepped in the standard fashion. Cystoscopy was performed with a flexible cystoscope. The urethra, bladder neck and entire bladder was visualized in a standard fashion. The prostate was moderate in size with obstructing lateral lobes. The ureteral orifices were visualized in their normal location and orientation.  Mild bladder trabeculations, no abnormalities on retroflexion.  Cytology sent  Findings: Normal cystoscopy  Assessment and Plan: Call with cytology results, if positive consider OR for bilateral retrograde pyelograms and possible biopsy  Nickolas Madrid, MD 06/18/2020

## 2020-06-23 ENCOUNTER — Telehealth: Payer: Self-pay

## 2020-06-23 LAB — CYTOLOGY - NON PAP

## 2020-06-23 NOTE — Telephone Encounter (Signed)
-----   Message from Billey Co, MD sent at 06/23/2020  9:30 AM EDT ----- No cancer cells on urine cytology, follow-up with urology as needed

## 2020-06-23 NOTE — Telephone Encounter (Signed)
See my chart message

## 2020-07-10 ENCOUNTER — Other Ambulatory Visit: Payer: Self-pay

## 2020-07-10 MED ORDER — ATORVASTATIN CALCIUM 80 MG PO TABS: 80.0000 mg | ORAL_TABLET | Freq: Every day | ORAL | 0 refills | Status: DC

## 2020-07-17 ENCOUNTER — Other Ambulatory Visit
Admission: RE | Admit: 2020-07-17 | Discharge: 2020-07-17 | Disposition: A | Discharge status: Home / Self Care | Payer: Medicare Other | Source: Ambulatory Visit | Attending: Internal Medicine | Admitting: Internal Medicine

## 2020-07-17 DIAGNOSIS — E785 Hyperlipidemia, unspecified: Secondary | ICD-10-CM | POA: Insufficient documentation

## 2020-07-17 LAB — LIPID PANEL
Cholesterol: 114 mg/dL (ref 0–200)
HDL: 50 mg/dL (ref >40)
LDL Cholesterol: 54 mg/dL (ref 0–99)
Total CHOL/HDL Ratio: 2.3 RATIO
Triglycerides: 49 mg/dL (ref <150)
VLDL: 10 mg/dL (ref 0–40)

## 2020-08-19 ENCOUNTER — Telehealth: Payer: Self-pay | Admitting: *Deleted

## 2020-08-19 NOTE — Chronic Care Management (AMB) (Signed)
  Chronic Care Management   Note  08/19/2020 Name: Edward [name]. MRN: [id number] DOB: [birth year]  Edward Heir. is a 70 y.o. year old male who is a primary care patient of Chrismon, Vickki Muff, PA-C. I reached out to Edward Heir. by phone today in response to a referral sent by [name]'s PCP Chrismon, Vickki Muff, PA-C     Edward [name] was given information about Chronic Care Management services today including:  CCM service includes personalized support from designated clinical staff supervised by his physician, including individualized plan of care and coordination with other care providers 24/7 contact phone numbers for assistance for urgent and routine care needs. Service will only be billed when office clinical staff spend 20 minutes or more in a month to coordinate care. Only one practitioner may furnish and bill the service in a calendar month. The patient may stop CCM services at any time (effective at the end of the month) by phone call to the office staff. The patient will be responsible for cost sharing (co-pay) of up to 20% of the service fee (after annual deductible is met).  Patient did not agree to enrollment in care management services and does not wish to consider at this time.  Follow up plan: Patient declines further follow up and engagement by the care management team. Appropriate care team members and provider have been notified via electronic communication.   Julian Hy, [facility]  Direct Dial: [phone]

## 2020-08-23 ENCOUNTER — Other Ambulatory Visit: Payer: Self-pay | Admitting: Family Medicine

## 2020-09-08 ENCOUNTER — Other Ambulatory Visit: Payer: Self-pay

## 2020-09-08 MED ORDER — HYDROCHLOROTHIAZIDE 25 MG PO TABS: 25.0000 mg | ORAL_TABLET | Freq: Every day | ORAL | 0 refills | Status: DC

## 2020-09-25 ENCOUNTER — Other Ambulatory Visit: Payer: Self-pay | Admitting: Internal Medicine

## 2020-10-13 ENCOUNTER — Ambulatory Visit: Payer: Self-pay | Admitting: *Deleted

## 2020-10-13 NOTE — Telephone Encounter (Signed)
Pt called in c/o LLQ in abd without radiation for a week now that started a week ago that is progressively getting worse.  Having normal BMs without blood.   He does have an inguinal and umbilical hernia but this pain is below and to the left of his abd.   He thinks it's possible diverticulitis too.  There are no appts available within the 24 hr timeframe indicated on the protocol.   I have sent a note to [facility] requesting an appt with Tally Joe, NP.  [facility] does not schedule for her.   He was agreeable to having someone call him back. I went over the s/s to go to the ED if they occur.   Sudden, severe pain, pain becoming worse, blood from rectal area.   He verbalized understanding.  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

## 2020-10-13 NOTE — Telephone Encounter (Signed)
Edward [name] has no availability till Friday, contacted patient and left message. Dr. Rosanna Randy has open appts on 10/15/20 please have patient scheduled with doctor to be assessed for LLQ pain. KW

## 2020-10-13 NOTE — Telephone Encounter (Signed)
Reason for Disposition  Age > 60 years  Answer Assessment - Initial Assessment Questions 1. LOCATION: "Where does it hurt?"      Pt calling in.  Having pain LLQ of abd for a week.   Maybe it's diverticulitis.   I've had a couple of BMs that did not resolve the pain.   I seem to be getting worse.  I have 2  hernias;  inguinal and umbilical hernias.   This pain is lower and to the left more.    2. RADIATION: "Does the pain shoot anywhere else?" (e.g., chest, back)     No radiation 3. ONSET: "When did the pain begin?" (Minutes, hours or days ago)      A week ago 4. SUDDEN: "Gradual or sudden onset?"     Gradually  5. PATTERN "Does the pain come and go, or is it constant?"    - If constant: "Is it getting better, staying the same, or worsening?"      (Note: Constant means the pain never goes away completely; most serious pain is constant and it progresses)     - If intermittent: "How long does it last?" "Do you have pain now?"     (Note: Intermittent means the pain goes away completely between bouts)     Intermittent but lasting longer  6. SEVERITY: "How bad is the pain?"  (e.g., Scale 1-10; mild, moderate, or severe)    - MILD (1-3): doesn't interfere with normal activities, abdomen soft and not tender to touch     - MODERATE (4-7): interferes with normal activities or awakens from sleep, abdomen tender to touch     - SEVERE (8-10): excruciating pain, doubled over, unable to do any normal activities       He has a high pain tolerance so 4-5 on scale. 7. RECURRENT SYMPTOM: "Have you ever had this type of stomach pain before?" If Yes, ask: "When was the last time?" and "What happened that time?"      Yes  During having a BM or if I straining.   I put my arm over the LLQ of my abd when having a BM.   I've done that for several years.  8. CAUSE: "What do you think is causing the stomach pain?"     Diverticulitis or a hernia 9. RELIEVING/AGGRAVATING FACTORS: "What makes it better or worse?" (e.g.,  movement, antacids, bowel movement)     Last night I took Maalox to see if it would help.   It didn't help.  I had a BM last night.    I've lost wt over the last couple of wks on purpose.    I'm on a low carb diet for 3 wks.   Lost 15-17 lbs.   I do this diet every so often to loose wt.   I'm 208 lbs now. I have my 50 year high school reunion coming up so loosing lbs for that.   10. OTHER SYMPTOMS: "Do you have any other symptoms?" (e.g., back pain, diarrhea, fever, urination pain, vomiting)       No vomiting or diarrhea.  No urinary symptoms.  Protocols used: Abdominal Pain - Male-A-AH

## 2020-10-14 ENCOUNTER — Ambulatory Visit: Payer: Medicare Other | Admitting: Family Medicine

## 2020-10-15 ENCOUNTER — Other Ambulatory Visit: Payer: Self-pay

## 2020-10-15 ENCOUNTER — Ambulatory Visit (INDEPENDENT_AMBULATORY_CARE_PROVIDER_SITE_OTHER): Payer: Medicare Other | Admitting: Family Medicine

## 2020-10-15 ENCOUNTER — Encounter: Payer: Self-pay | Admitting: Family Medicine

## 2020-10-15 VITALS — BP 114/72 | HR 52 | Temp 98.8°F | Resp 16 | Ht 70.0 in | Wt 206.0 lb

## 2020-10-15 DIAGNOSIS — Z9081 Acquired absence of spleen: Secondary | ICD-10-CM

## 2020-10-15 DIAGNOSIS — F419 Anxiety disorder, unspecified: Secondary | ICD-10-CM

## 2020-10-15 DIAGNOSIS — Z9889 Other specified postprocedural states: Secondary | ICD-10-CM | POA: Diagnosis not present

## 2020-10-15 DIAGNOSIS — I255 Ischemic cardiomyopathy: Secondary | ICD-10-CM

## 2020-10-15 DIAGNOSIS — I5022 Chronic systolic (congestive) heart failure: Secondary | ICD-10-CM | POA: Diagnosis not present

## 2020-10-15 DIAGNOSIS — I2510 Atherosclerotic heart disease of native coronary artery without angina pectoris: Secondary | ICD-10-CM

## 2020-10-15 NOTE — Progress Notes (Signed)
Established patient visit   Patient: Edward [name].   DOB: 1950-03-30   70 y.o. Male  MRN: [id number] Visit Date: 10/15/2020  Today's healthcare provider: Wilhemena Durie, MD   Chief Complaint  Patient presents with   Abdominal Pain   Subjective    Abdominal Pain The pain is located in the LUQ. The pain is mild. The quality of the pain is aching. The abdominal pain does not radiate. Pertinent negatives include no fever, nausea or vomiting. The pain is relieved by Nothing. He has tried nothing for the symptoms.   Patient has occasional constipation. No chest pain or dyspnea. no other GI symptoms.   Medications: Outpatient Medications Prior to Visit  Medication Sig   atorvastatin (LIPITOR) 80 MG tablet TAKE 1 TABLET EVERY DAY   carvedilol (COREG) 3.125 MG tablet Take 1 tablet (3.125 mg total) by mouth 2 (two) times daily.   clopidogrel (PLAVIX) 75 MG tablet TAKE 1 TABLET EVERY DAY   hydrochlorothiazide (HYDRODIURIL) 25 MG tablet Take 1 tablet (25 mg total) by mouth daily.   LORazepam (ATIVAN) 0.5 MG tablet Take 1 tablet (0.5 mg total) by mouth every 8 (eight) hours as needed for anxiety.   losartan (COZAAR) 50 MG tablet TAKE 1 TABLET EVERY DAY   nitroGLYCERIN (NITROSTAT) 0.4 MG SL tablet Place 1 tablet (0.4 mg total) under the tongue every 5 (five) minutes as needed for chest pain. Maximum of 3 doses.   Skin Protectants, Misc. (EUCERIN) cream Apply 1 application topically 3 (three) times daily as needed for dry skin.   tamsulosin (FLOMAX) 0.4 MG CAPS capsule TAKE 1 CAPSULE EVERY DAY   No facility-administered medications prior to visit.    Review of Systems  Constitutional:  Negative for appetite change, chills and fever.  Respiratory:  Negative for chest tightness, shortness of breath and wheezing.   Cardiovascular:  Negative for chest pain and palpitations.  Gastrointestinal:  Positive for abdominal distention and abdominal pain. Negative for nausea and vomiting.        Objective    BP 114/72   Pulse (!) 52   Temp 98.8 F (37.1 C)   Resp 16   Ht '5\' 10"'$  (1.778 m)   Wt 206 lb (93.4 kg)   BMI 29.56 kg/m  BP Readings from Last 3 Encounters:  10/15/20 114/72  06/18/20 (!) 153/89  06/11/20 120/86   Wt Readings from Last 3 Encounters:  10/15/20 206 lb (93.4 kg)  06/18/20 221 lb (100.2 kg)  06/11/20 221 lb (100.2 kg)      Physical Exam Vitals and nursing note reviewed.  Constitutional:      Appearance: He is well-developed.  HENT:     Head: Normocephalic and atraumatic.     Right Ear: External ear normal.     Left Ear: External ear normal.     Nose: Nose normal.  Eyes:     General:        Right eye: No discharge.     Conjunctiva/sclera: Conjunctivae normal.     Pupils: Pupils are equal, round, and reactive to light.  Neck:     Thyroid: No thyromegaly.     Trachea: No tracheal deviation.  Cardiovascular:     Rate and Rhythm: Normal rate and regular rhythm.     Heart sounds: Normal heart sounds. No murmur heard. Pulmonary:     Effort: Pulmonary effort is normal. No respiratory distress.     Breath sounds: Normal breath sounds. No wheezing  or rales.  Chest:     Chest wall: No tenderness.  Abdominal:     General: There is no distension.     Palpations: Abdomen is soft. There is no mass.     Tenderness: There is no abdominal tenderness. There is no guarding or rebound.  Genitourinary:    Penis: Normal.      Testes: Normal.     Prostate: Normal.     Rectum: Normal. Guaiac result negative.  Musculoskeletal:        General: No tenderness. Normal range of motion.     Cervical back: Normal range of motion and neck supple.  Lymphadenopathy:     Cervical: No cervical adenopathy.  Skin:    General: Skin is warm and dry.     Findings: No erythema or rash.  Neurological:     Mental Status: He is alert and oriented to person, place, and time.     Cranial Nerves: No cranial nerve deficit.     Motor: No abnormal muscle tone.      Coordination: Coordination normal.     Deep Tendon Reflexes: Reflexes are normal and symmetric. Reflexes normal.  Psychiatric:        Behavior: Behavior normal.        Thought Content: Thought content normal.        Judgment: Judgment normal.      No results found for any visits on 10/15/20.  Assessment & Plan     1. Coronary artery disease involving native coronary artery of native heart without angina pectoris I do not think these symptoms are heart related  2. Chronic HFrEF (heart failure with reduced ejection fraction) ([place])   3. Anxiety Moderate problem  4. H/O splenectomy   5. History of major abdominal surgery It is possible some of this pain is a hernia.  We will follow clinically for now.   No follow-ups on file.      I, Wilhemena Durie, MD, have reviewed all documentation for this visit. The documentation on 10/19/20 for the exam, diagnosis, procedures, and orders are all accurate and complete.    Rashika Bettes Cranford Mon, MD  [facility] [phone] (phone) [phone] (fax)  [place]

## 2020-10-15 NOTE — Patient Instructions (Signed)
Try OTC Metamucil once daily.

## 2020-10-21 ENCOUNTER — Other Ambulatory Visit: Payer: Self-pay

## 2020-10-21 ENCOUNTER — Ambulatory Visit
Admission: RE | Admit: 2020-10-21 | Discharge: 2020-10-21 | Disposition: A | Discharge status: Home / Self Care | Payer: Medicare Other | Source: Ambulatory Visit | Attending: Emergency Medicine | Admitting: Emergency Medicine

## 2020-10-21 DIAGNOSIS — R911 Solitary pulmonary nodule: Secondary | ICD-10-CM | POA: Diagnosis not present

## 2020-10-21 DIAGNOSIS — J439 Emphysema, unspecified: Secondary | ICD-10-CM | POA: Diagnosis not present

## 2020-10-21 DIAGNOSIS — I700 Atherosclerosis of aorta: Secondary | ICD-10-CM | POA: Diagnosis not present

## 2020-10-21 DIAGNOSIS — R918 Other nonspecific abnormal finding of lung field: Secondary | ICD-10-CM

## 2020-11-13 DIAGNOSIS — Z23 Encounter for immunization: Secondary | ICD-10-CM | POA: Diagnosis not present

## 2020-12-22 ENCOUNTER — Other Ambulatory Visit: Payer: Self-pay | Admitting: Internal Medicine

## 2020-12-22 MED ORDER — HYDROCHLOROTHIAZIDE 25 MG PO TABS: 25.0000 mg | ORAL_TABLET | Freq: Every day | ORAL | 0 refills | Status: DC

## 2020-12-22 NOTE — Telephone Encounter (Signed)
Please schedule 6 month F/U appointment. Thank you! 

## 2020-12-23 ENCOUNTER — Telehealth: Payer: Self-pay | Admitting: Cardiovascular Disease

## 2020-12-23 ENCOUNTER — Ambulatory Visit: Payer: Self-pay

## 2020-12-23 ENCOUNTER — Telehealth: Payer: Self-pay | Admitting: Internal Medicine

## 2020-12-23 MED ORDER — HYDROCHLOROTHIAZIDE 25 MG PO TABS: 25.0000 mg | ORAL_TABLET | Freq: Every day | ORAL | 0 refills | Status: DC

## 2020-12-23 NOTE — Telephone Encounter (Signed)
Patient want to know if medicine for covid ,  can be sent to pharmacy [facility] [address] [place], [place] [address]   Pt. Reports he just came back from [place]. Started with COVID 19 symptoms over 1 week ago. Head congestion, scratchy throat. States symptoms are mild today. Pt. Is out of the window for anti-virals. Reviewed home care. Verbalizes understanding. Instructed to call back for worsening of symptoms.      Answer Assessment - Initial Assessment Questions 1. COVID-19 DIAGNOSIS: "Who made your COVID-19 diagnosis?" "Was it confirmed by a positive lab test or self-test?" If not diagnosed by a doctor (or NP/PA), ask "Are there lots of cases (community spread) where you live?" Note: See public health department website, if unsure.     Home test - last night 2. COVID-19 EXPOSURE: "Was there any known exposure to COVID before the symptoms began?" CDC Definition of close contact: within 6 feet (2 meters) for a total of 15 minutes or more over a 24-hour period.      Yes 3. ONSET: "When did the COVID-19 symptoms start?"      3 days ago 4. WORST SYMPTOM: "What is your worst symptom?" (e.g., cough, fever, shortness of breath, muscle aches)     Headache, head congestion, scratchy throat 5. COUGH: "Do you have a cough?" If Yes, ask: "How bad is the cough?"       No 6. FEVER: "Do you have a fever?" If Yes, ask: "What is your temperature, how was it measured, and when did it start?"     No 7. RESPIRATORY STATUS: "Describe your breathing?" (e.g., shortness of breath, wheezing, unable to speak)      No 8. BETTER-SAME-WORSE: "Are you getting better, staying the same or getting worse compared to yesterday?"  If getting worse, ask, "In what way?"     Same 9. HIGH RISK DISEASE: "Do you have any chronic medical problems?" (e.g., asthma, heart or lung disease, weak immune system, obesity, etc.)     HTN, Heart disease 10. VACCINE: "Have you had the COVID-19 vaccine?" If Yes, ask: "Which one, how many shots,  when did you get it?"       Yes 11. BOOSTER: "Have you received your COVID-19 booster?" If Yes, ask: "Which one and when did you get it?"       Yes 12. PREGNANCY: "Is there any chance you are pregnant?" "When was your last menstrual period?"       N/a 13. OTHER SYMPTOMS: "Do you have any other symptoms?"  (e.g., chills, fatigue, headache, loss of smell or taste, muscle pain, sore throat)       Headache, scratchy throat 14. O2 SATURATION MONITOR:  "Do you use an oxygen saturation monitor (pulse oximeter) at home?" If Yes, ask "What is your reading (oxygen level) today?" "What is your usual oxygen saturation reading?" (e.g., 95%)       No  Protocols used: Coronavirus (COVID-19) Diagnosed or Suspected-A-AH

## 2020-12-23 NOTE — Telephone Encounter (Signed)
Requested Prescriptions  ° °Signed Prescriptions Disp Refills  ° hydrochlorothiazide (HYDRODIURIL) 25 MG tablet 90 tablet 0  °  Sig: Take 1 tablet (25 mg total) by mouth daily.  °  Authorizing Provider: END, CHRISTOPHER  °  Ordering User: NEWCOMER MCCLAIN, Izael Bessinger L  ° ° °

## 2020-12-23 NOTE — Telephone Encounter (Signed)
error 

## 2020-12-23 NOTE — Telephone Encounter (Signed)
*  STAT* If patient is at the pharmacy, call can be transferred to refill team.   1. Which medications need to be refilled? (please list name of each medication and dose if known) Hydrochlorothiazide 25 mg 1 tablet daily  2. Which pharmacy/location (including street and city if local pharmacy) is medication to be sent to? [facility]  3. Do they need a 30 day or 90 day supply? 90 day

## 2020-12-24 DIAGNOSIS — U071 COVID-19: Secondary | ICD-10-CM | POA: Diagnosis not present

## 2020-12-31 ENCOUNTER — Other Ambulatory Visit: Payer: Self-pay | Admitting: Internal Medicine

## 2021-01-17 ENCOUNTER — Other Ambulatory Visit: Payer: Self-pay | Admitting: Internal Medicine
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
## 2021-02-09 ENCOUNTER — Other Ambulatory Visit: Payer: Self-pay

## 2021-02-09 ENCOUNTER — Ambulatory Visit (INDEPENDENT_AMBULATORY_CARE_PROVIDER_SITE_OTHER): Payer: Medicare Other | Admitting: Medical

## 2021-02-09 ENCOUNTER — Encounter: Payer: Self-pay | Admitting: Medical

## 2021-02-09 VITALS — BP 152/86 | HR 57 | Ht 70.0 in | Wt 227.0 lb

## 2021-02-09 DIAGNOSIS — E782 Mixed hyperlipidemia: Secondary | ICD-10-CM

## 2021-02-09 DIAGNOSIS — I2510 Atherosclerotic heart disease of native coronary artery without angina pectoris: Secondary | ICD-10-CM

## 2021-02-09 DIAGNOSIS — I10 Essential (primary) hypertension: Secondary | ICD-10-CM | POA: Diagnosis not present

## 2021-02-09 DIAGNOSIS — I255 Ischemic cardiomyopathy: Secondary | ICD-10-CM

## 2021-02-09 NOTE — Patient Instructions (Signed)
Medication Instructions:  ° °Your physician recommends that you continue on your current medications as directed. Please refer to the Current Medication list given to you today.  ° °*If you need a refill on your cardiac medications before your next appointment, please call your pharmacy* ° ° °Lab Work: ° °None ordered ° °If you have labs (blood work) drawn today and your tests are completely normal, you will receive your results only by: °MyChart Message (if you have MyChart) OR °A paper copy in the mail °If you have any lab test that is abnormal or we need to change your treatment, we will call you to review the results. ° ° °Testing/Procedures: °None ordered ° ° °Follow-Up: °At [facility], you and your health needs are our priority.  As part of our continuing mission to provide you with exceptional heart care, we have created designated Provider Care Teams.  These Care Teams include your primary Cardiologist (physician) and Advanced Practice Providers (APPs -  Physician Assistants and Nurse Practitioners) who all work together to provide you with the care you need, when you need it. ° °We recommend signing up for the patient portal called "MyChart".  Sign up information is provided on this After Visit Summary.  MyChart is used to connect with patients for Virtual Visits (Telemedicine).  Patients are able to view lab/test results, encounter notes, upcoming appointments, etc.  Non-urgent messages can be sent to your provider as well.   °To learn more about what you can do with MyChart, go to [email or web address].   ° °Your next appointment:   °6 month(s) ° °The format for your next appointment:   °In Person ° °Provider:   °You may see Christopher End, MD or one of the following Advanced Practice Providers on your designated Care Team:   °Christopher Berge, NP °Ryan Dunn, PA-C °Cadence Furth, PA-C1}  ° ° °Other Instructions °N/A ° °

## 2021-02-19 ENCOUNTER — Other Ambulatory Visit: Payer: Self-pay | Admitting: Internal Medicine

## 2021-03-05 ENCOUNTER — Telehealth: Payer: Self-pay | Admitting: Family Medicine

## 2021-03-05 DIAGNOSIS — R351 Nocturia: Secondary | ICD-10-CM

## 2021-03-05 NOTE — Telephone Encounter (Signed)
[facility] faxed refill request for the following medications:  tamsulosin (FLOMAX) 0.4 MG CAPS capsule   Please advise.

## 2021-03-06 MED ORDER — TAMSULOSIN HCL 0.4 MG PO CAPS: 0.4000 mg | ORAL_CAPSULE | Freq: Every day | ORAL | 0 refills | Status: DC

## 2021-03-20 ENCOUNTER — Other Ambulatory Visit: Payer: Self-pay | Admitting: Internal Medicine

## 2021-05-02 ENCOUNTER — Other Ambulatory Visit: Payer: Self-pay | Admitting: Family Medicine

## 2021-05-02 ENCOUNTER — Other Ambulatory Visit: Payer: Self-pay | Admitting: Internal Medicine

## 2021-05-02 DIAGNOSIS — R351 Nocturia: Secondary | ICD-10-CM

## 2021-05-04 MED ORDER — CARVEDILOL 3.125 MG PO TABS: 3.1250 mg | ORAL_TABLET | Freq: Two times a day (BID) | ORAL | 0 refills | Status: DC

## 2021-05-04 MED ORDER — HYDROCHLOROTHIAZIDE 25 MG PO TABS: 25.0000 mg | ORAL_TABLET | Freq: Every day | ORAL | 0 refills | Status: DC

## 2021-05-04 MED ORDER — CLOPIDOGREL BISULFATE 75 MG PO TABS: 75.0000 mg | ORAL_TABLET | Freq: Every day | ORAL | 0 refills | Status: DC

## 2021-05-20 ENCOUNTER — Other Ambulatory Visit: Payer: Self-pay | Admitting: Family Medicine

## 2021-05-20 DIAGNOSIS — R351 Nocturia: Secondary | ICD-10-CM

## 2021-05-27 ENCOUNTER — Other Ambulatory Visit: Payer: Self-pay | Admitting: Internal Medicine

## 2021-07-01 IMAGING — US US PELVIS LIMITED
1 series · 15 of 25 positions shown · non-contrast
Comparison: None.

CLINICAL DATA: Midline palpable mass in the lower abdomen.

EXAM:
US PELVIS LIMITED
TECHNIQUE: Ultrasound examination of the pelvic soft tissues was performed in
the area of clinical concern.

[Series 1: us extrem low bilat ltd · 30 acquisitions, 15 frames shown]
[im 1/30]
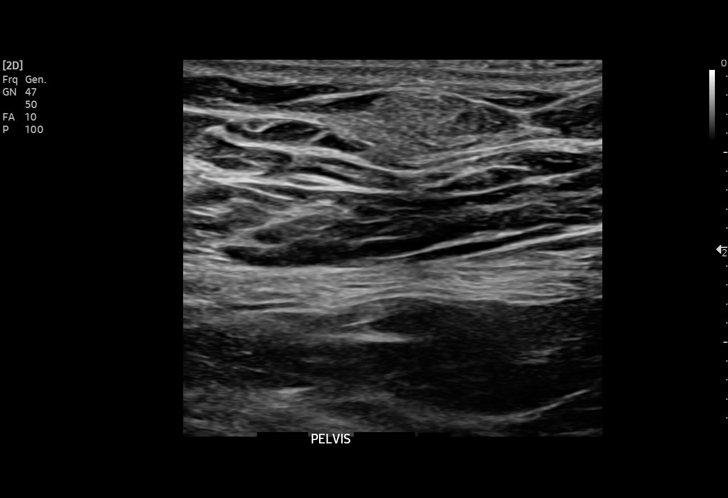
[im 3/30]
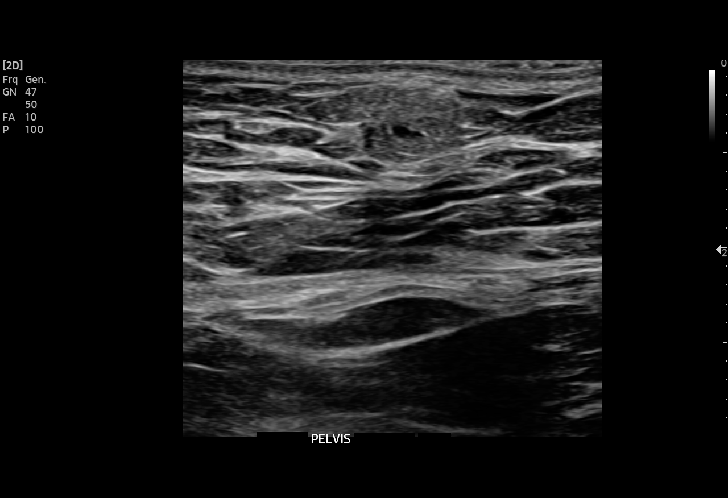
[im 5/30]
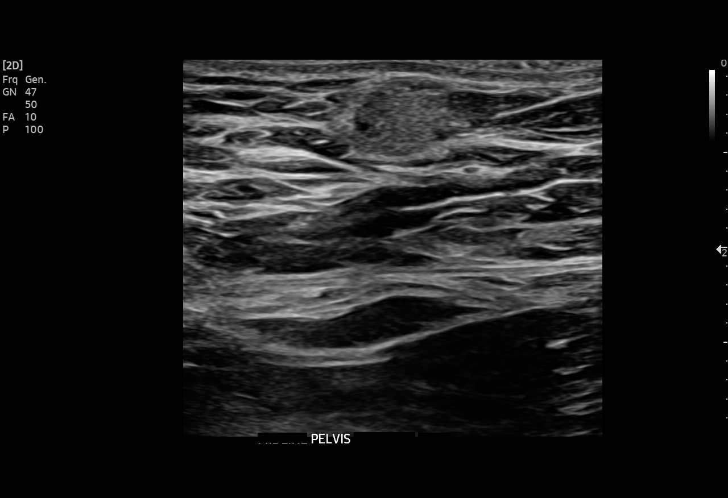
[im 7/30]
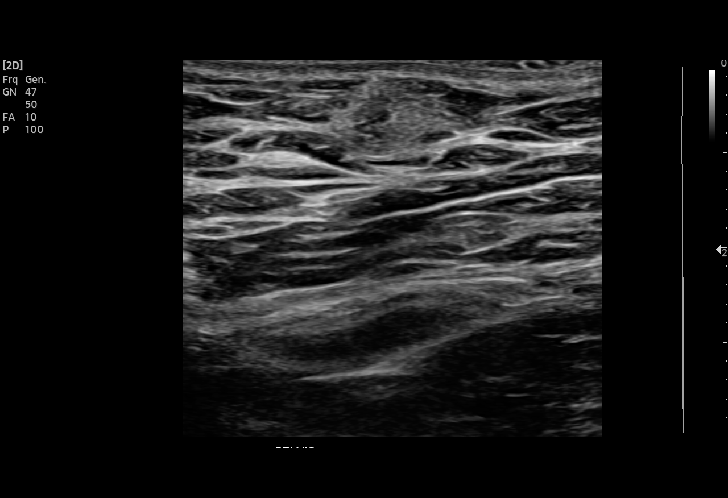
[im 9/30]
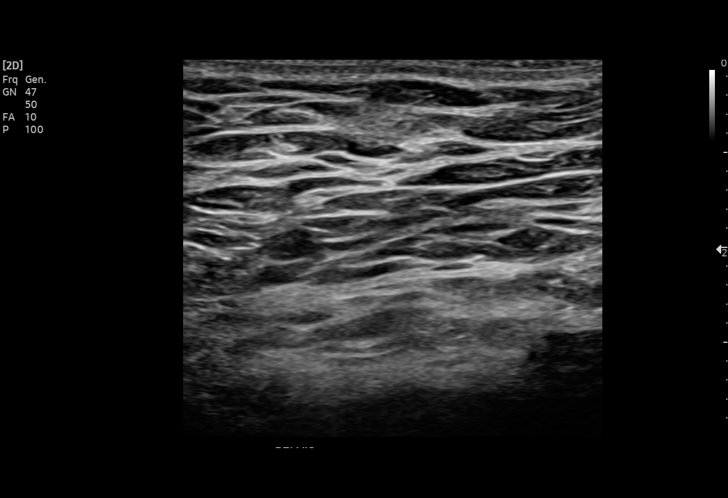
[im 11/30]
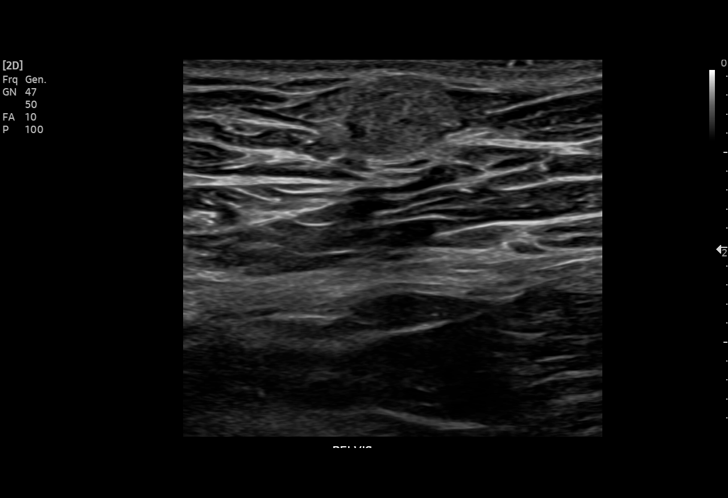
[im 13/30]
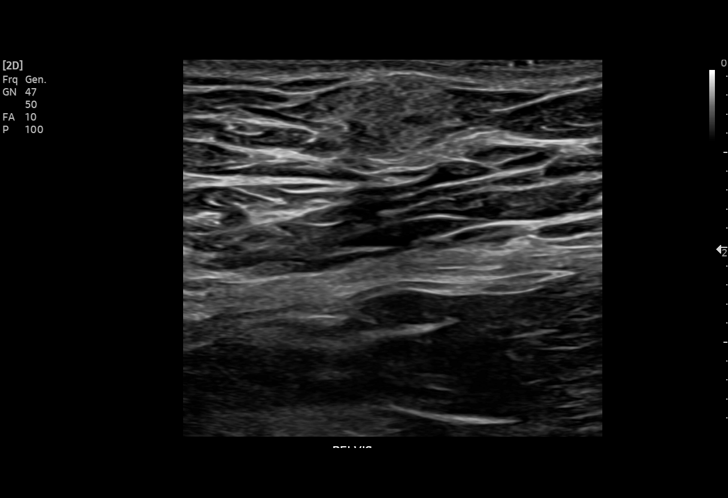
[im 15/30]
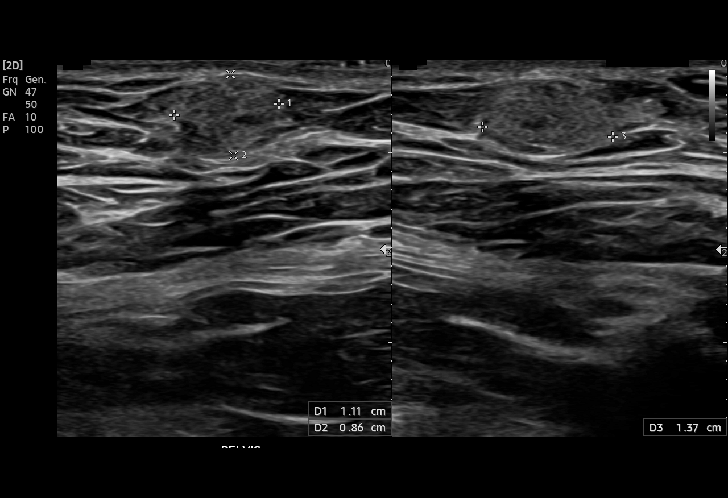
[im 17/30]
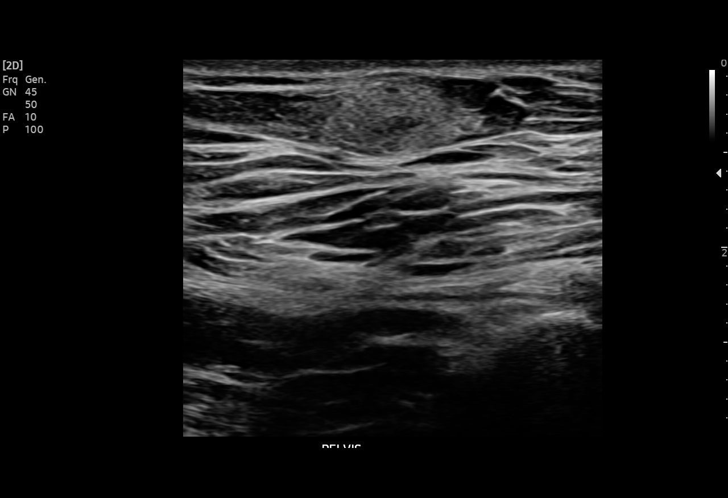
[im 19/30]
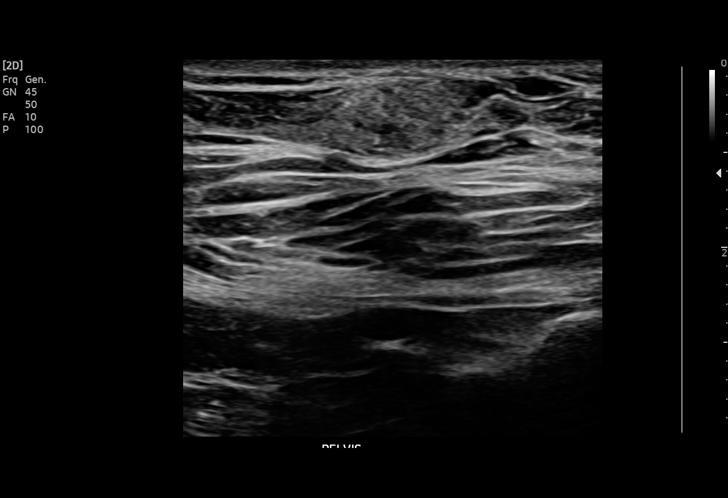
[im 21/30]
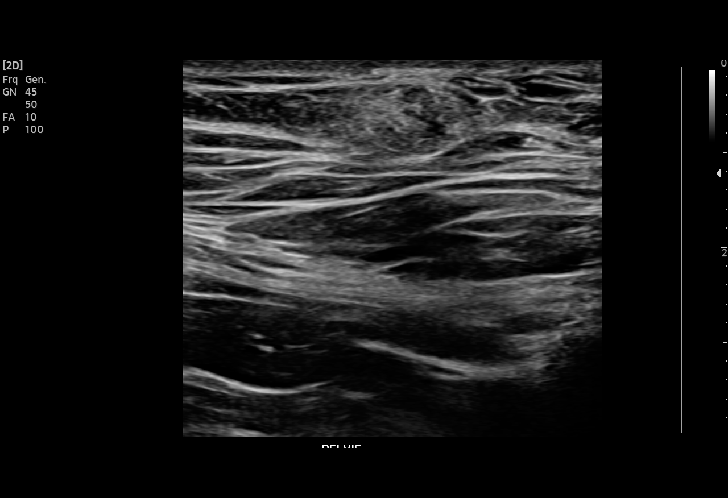
[im 23/30]
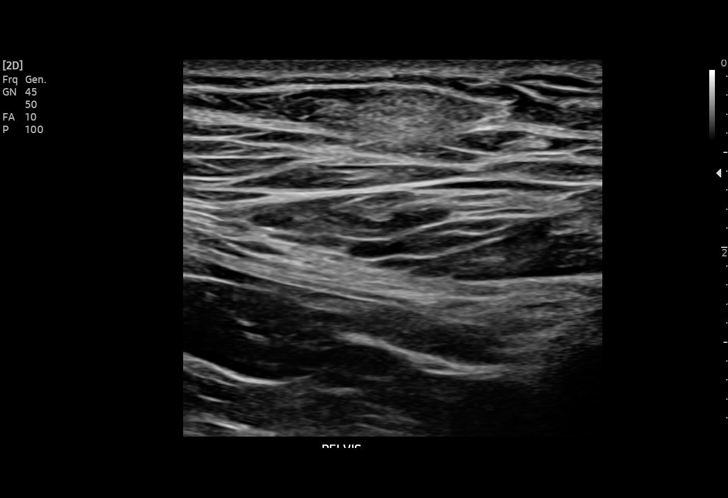
[im 25/30]
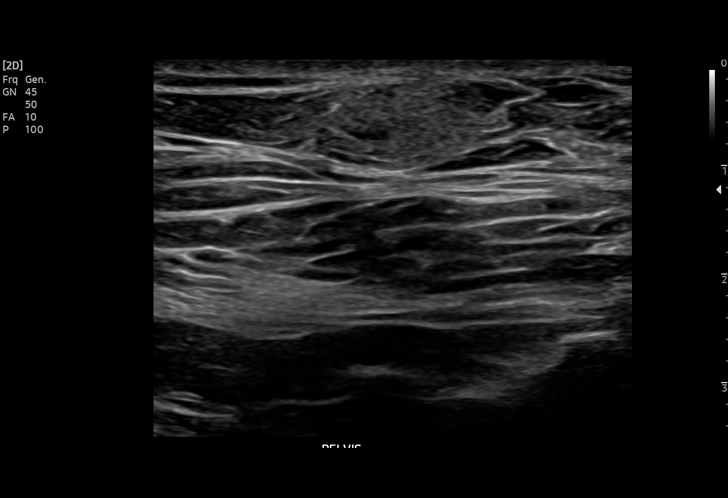
[im 27/30]
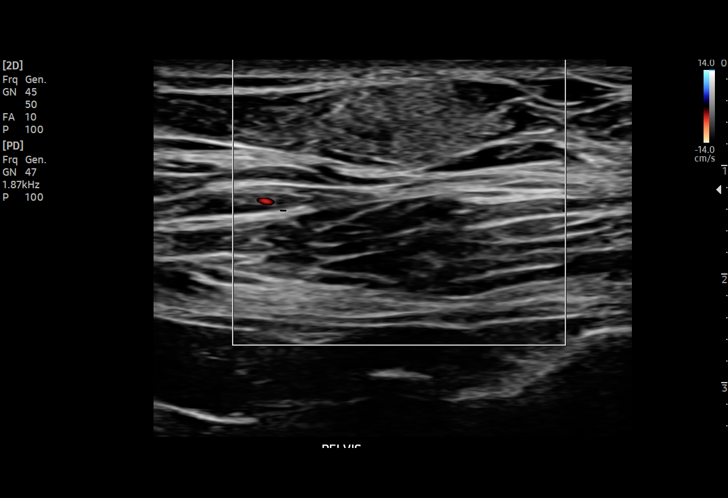
[im 30/30]
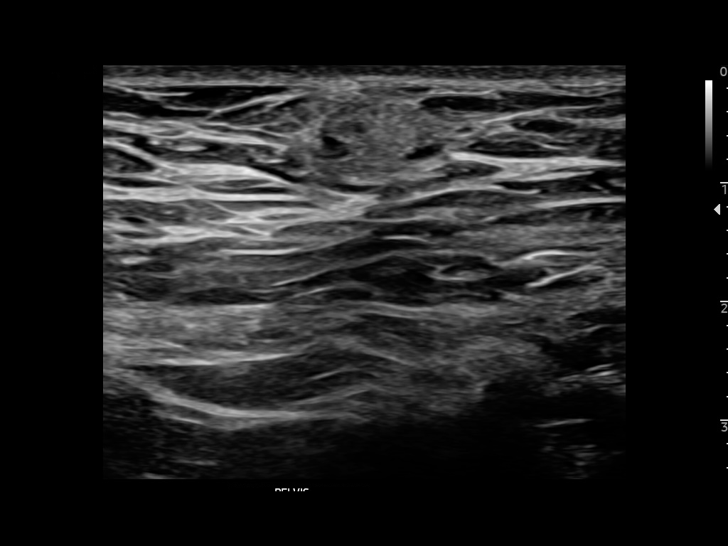

[15 of 25 positions shown; findings below may reference images not displayed]

FINDINGS: Focused ultrasound of the midline palpable abnormality in the lower
abdomen demonstrates a 1.4 x 0.9 x 1.1 cm predominantly hyperechoic
mass in the superficial subcutaneous fat just deep to the skin
surface. The mass demonstrates slightly indistinct margins, but has
no internal vascularity.
IMPRESSION: 1. Palpable abnormality corresponds to a superficial 1.4 cm mass,
likely a lipoma.

## 2021-07-15 ENCOUNTER — Ambulatory Visit (INDEPENDENT_AMBULATORY_CARE_PROVIDER_SITE_OTHER): Payer: Medicare Other

## 2021-07-15 VITALS — BP 140/80 | Ht 71.0 in | Wt 227.6 lb

## 2021-07-15 DIAGNOSIS — Z0000 Encounter for general adult medical examination without abnormal findings: Secondary | ICD-10-CM | POA: Diagnosis not present

## 2021-07-15 NOTE — Progress Notes (Signed)
Subjective:   Edward [name]. is a 71 y.o. male who presents for Medicare Annual/Subsequent preventive examination.  Review of Systems           Objective:    Today's Vitals   07/15/21 0819  BP: 140/80  Weight: 227 lb 9.6 oz (103.2 kg)  Height: '5\' 11"'$  (1.803 m)   Body mass index is 31.74 kg/m.     01/28/2020    3:23 PM 10/15/2019   11:06 AM 10/05/2018    2:12 PM 09/20/2017    7:48 AM 09/15/2017   12:22 AM 03/13/2017    8:25 PM 08/27/2016    4:35 PM  Advanced Directives  Does Patient Have a Medical Advance Directive? No Yes No No No No No  Type of Social research officer, government;Living will       Copy of [place] in Chart?  No - copy requested       Would patient like information on creating a medical advance directive? No - Patient declined   No - Patient declined No - Patient declined No - Patient declined No - Patient declined    Current Medications (verified) Outpatient Encounter Medications as of 07/15/2021  Medication Sig   atorvastatin (LIPITOR) 80 MG tablet TAKE 1 TABLET EVERY DAY   carvedilol (COREG) 3.125 MG tablet Take 1 tablet (3.125 mg total) by mouth 2 (two) times daily with a meal.   clopidogrel (PLAVIX) 75 MG tablet Take 1 tablet (75 mg total) by mouth daily.   hydrochlorothiazide (HYDRODIURIL) 25 MG tablet Take 1 tablet (25 mg total) by mouth daily.   LORazepam (ATIVAN) 0.5 MG tablet Take 1 tablet (0.5 mg total) by mouth every 8 (eight) hours as needed for anxiety.   losartan (COZAAR) 50 MG tablet TAKE 1 TABLET EVERY DAY   nitroGLYCERIN (NITROSTAT) 0.4 MG SL tablet Place 1 tablet (0.4 mg total) under the tongue every 5 (five) minutes as needed for chest pain. Maximum of 3 doses.   Skin Protectants, Misc. (EUCERIN) cream Apply 1 application topically 3 (three) times daily as needed for dry skin.   tamsulosin (FLOMAX) 0.4 MG CAPS capsule TAKE 1 CAPSULE (0.4 MG TOTAL) BY MOUTH DAILY.   No facility-administered encounter  medications on file as of 07/15/2021.    Allergies (verified) Patient has no known allergies.   History: Past Medical History:  Diagnosis Date   Anxiety    Apical mural thrombus    a. s/p 3 months of anticoagulation   BPH (benign prostatic hyperplasia)    Coronary artery disease    a. 03/2016: LM nl, LAD thrombotic occlusion (3.0x38 Resolute Integrity DES), D1 70 (jailed), mild to mod nonobs LCx & RCA dzs; b. MV 7/18: Mod mid/apical antsept defect, no isch, EF 45%->Med Rx; c. 09/2017 Cath: LM nl, LAD patent stent, D1 70ost (jailed), LCX 50p, OM1 100, OM2 30, RCA 30p, RPAV 50, EF 50-55%->Med Rx.   HFrEF (heart failure with reduced ejection fraction) ([place])    a. TTE 2/18:  EF of 30-35% (in setting of MI); b. TTE 5/18: EF 35-40%; c. 10/2017 Echo: EF 40-45%, mid-apicalantsept, ant, ap sev HK, Gr1 DD. Mild BAE.   Hyperlipidemia    Hypertension    Ischemic cardiomyopathy    a. TTE 2/18:  EF of 30-35% with LAD territory AK, likely mural thrombus at the LV apex; b. TTE 5/18: EF 35-40%, mid and apical anterior/anteroseptal AK, no evidence of LV thrombus, Gr1DD, mild BAE, normal RVSF;  c. 10/2017 Echo: EF 40-45%, mid-apicalantsept, ant, ap sev HK, Gr1 DD. Mild BAE.   Myocardial infarction [facility])    Past Surgical History:  Procedure Laterality Date   CARDIAC CATHETERIZATION     COLONOSCOPY WITH PROPOFOL N/A 04/22/2017   Procedure: COLONOSCOPY WITH PROPOFOL;  Surgeon: Jonathon Bellows, MD;  Location[facility];  Service: Gastroenterology;  Laterality: N/A;   CORONARY ANGIOGRAPHY N/A 03/09/2016   Procedure: Coronary Angiography;  Surgeon: Nelva Bush, MD;  Location: [place] [facility];  Service: Cardiovascular;  Laterality: N/A;   CORONARY STENT INTERVENTION N/A 03/09/2016   Procedure: Coronary Stent Intervention;  Surgeon: Nelva Bush, MD;  Location: [place] [facility];  Service: Cardiovascular;  Laterality: N/A;   HERNIA REPAIR  2005   HYDROCELE EXCISION     LEFT HEART CATH AND CORONARY  ANGIOGRAPHY Left 09/20/2017   Procedure: LEFT HEART CATH AND CORONARY ANGIOGRAPHY;  Surgeon: Nelva Bush, MD;  Location: [place] [facility];  Service: Cardiovascular;  Laterality: Left;   SPLENECTOMY     Family History  Problem Relation Age of Onset   Emphysema Mother    Macular degeneration Mother    Heart attack Mother    Heart attack Father    Social History   Socioeconomic History   Marital status: Married    Spouse name: Not on file   Number of children: 0   Years of education: Not on file   Highest education level: Bachelor's degree (e.g., BA, AB, BS)  Occupational History   Not on file  Tobacco Use   Smoking status: Former    Packs/day: 3.00    Years: 30.00    Total pack years: 90.00    Types: Cigarettes    Start date: 07/03/2007   Smokeless tobacco: Never   Tobacco comments:    QUIT IN 2005  Vaping Use   Vaping Use: Never used  Substance and Sexual Activity   Alcohol use: Yes    Alcohol/week: 14.0 standard drinks of alcohol    Types: 14 Cans of beer per week    Comment: average of 2 beers per day   Drug use: Yes    Types: Marijuana    Comment: smokes pot occassionally   Sexual activity: Not on file  Other Topics Concern   Not on file  Social History Narrative   Living with family, Independent at baseline   Social Determinants of Health   Financial Resource Strain: Low Risk  (10/15/2019)   Overall Financial Resource Strain (CARDIA)    Difficulty of Paying Living Expenses: Not hard at all  Food Insecurity: No Food Insecurity (10/15/2019)   Hunger Vital Sign    Worried About Running Out of Food in the Last Year: Never true    Ran Out of Food in the Last Year: Never true  Transportation Needs: No Transportation Needs (10/15/2019)   PRAPARE - Hydrologist (Medical): No    Lack of Transportation (Non-Medical): No  Physical Activity: Sufficiently Active (07/15/2021)   Exercise Vital Sign    Days of Exercise per Week: 7 days     Minutes of Exercise per Session: 60 min  Stress: Stress Concern Present (07/15/2021)   [place]    Feeling of Stress : To some extent  Social Connections: Moderately Isolated (10/15/2019)   Social Connection and Isolation Panel [NHANES]    Frequency of Communication with Friends and Family: More than three times a week  Frequency of Social Gatherings with Friends and Family: More than three times a week    Attends Religious Services: Never    Marine scientist or Organizations: No    Attends Music therapist: Never    Marital Status: Married    Tobacco Counseling Counseling given: Not Answered Tobacco comments: QUIT IN 2005   Clinical Intake:  Pre-visit preparation completed: Yes  Pain : No/denies pain     Nutritional Risks: None Diabetes: No  How often do you need to have someone help you when you read instructions, pamphlets, or other written materials from your doctor or pharmacy?: 1 - Never  Diabetic?no  Interpreter Needed?: No  Information entered by :: Kirke Shaggy, LPN   Activities of Daily Living    07/11/2021    9:02 AM 07/04/2021    9:01 AM  In your present state of health, do you have any difficulty performing the following activities:  Hearing? 0 0   0  Vision? 0 0   0  Difficulty concentrating or making decisions? 0 0   0  Walking or climbing stairs? 0 0   0  Dressing or bathing? 0 0   0  Doing errands, shopping? 0 0   0  Preparing Food and eating ? N N   N  Using the Toilet? N N   N  In the past six months, have you accidently leaked urine? Y Y   Y  Do you have problems with loss of bowel control? N N   N  Managing your Medications? N N   N  Managing your Finances? N N   N  Housekeeping or managing your Housekeeping? Carloyn Manner    Patient Care Team: Pcp, No as PCP - General End, Harrell Gave, MD as PCP - Cardiology (Cardiology) [name], MD  (Inactive) [facility], [place] any recent [place] you may have received from other than [facility] providers in the past year (date may be approximate).     Assessment:   This is a routine wellness examination for Jacob.  Hearing/Vision screen No results found.  Dietary issues and exercise activities discussed:     Goals Addressed             This Visit's Progress    DIET - EAT MORE FRUITS AND VEGETABLES         Depression Screen    07/15/2021    8:24 AM 10/15/2019   10:54 AM 07/21/2018   11:05 AM 03/25/2017    9:29 AM 09/09/2016    8:28 AM 06/21/2016    1:45 PM 12/16/2015   11:18 AM  PHQ 2/9 Scores  PHQ - 2 Score 0 0 0 1 1 0 0  PHQ- 9 Score 0  0 '1 3 2     '$ Fall Risk    07/11/2021    9:02 AM 07/04/2021    9:01 AM 10/15/2019   11:07 AM 07/21/2018   11:05 AM 03/25/2017    9:29 AM  Fall Risk   Falls in the past year? 0 0   0 0 0 No  Number falls in past yr: 0 0   0 0    Injury with Fall? 0 0   0 0      FALL RISK PREVENTION PERTAINING TO THE HOME:  Any stairs in or around the home? Yes  If so, are there any without handrails? No  Home free of loose  throw rugs in walkways, pet beds, electrical cords, etc? Yes  Adequate lighting in your home to reduce risk of falls? Yes   ASSISTIVE DEVICES UTILIZED TO PREVENT FALLS:  Life alert? No  Use of a cane, walker or w/c? No  Grab bars in the bathroom? Yes  Shower chair or bench in shower? Yes  Elevated toilet seat or a handicapped toilet? Yes   TIMED UP AND GO:  Was the test performed? Yes .  Length of time to ambulate 10 feet: 4 sec.   Gait steady and fast without use of assistive device  Cognitive Function:        10/05/2018    2:21 PM  6CIT Screen  What Year? 0 points  What month? 0 points  What time? 0 points  Count back from 20 0 points  Months in reverse 0 points  Repeat phrase 0 points  Total Score 0 points    Immunizations Immunization History  Administered Date(s)  Administered   HiB (PRP-T) 04/11/2019   Influenza,inj,quad, With Preservative 02/02/2019   Influenza-Unspecified 02/01/2018, 01/11/2020   Meningococcal Conjugate 04/11/2019   PFIZER(Purple Top)SARS-COV-2 Vaccination 04/05/2019, 06/27/2019, 01/11/2020   Pneumococcal Conjugate-13 03/25/2017   Pneumococcal Polysaccharide-23 07/21/2018, 04/11/2019   Tdap 11/23/2012    TDAP status: Up to date  Flu Vaccine status: Declined, Education has been provided regarding the importance of this vaccine but patient still declined. Advised may receive this vaccine at local pharmacy or Health Dept. Aware to provide a copy of the vaccination record if obtained from local pharmacy or Health Dept. Verbalized acceptance and understanding.  Pneumococcal vaccine status: Up to date  Covid-19 vaccine status: Completed vaccines  Qualifies for Shingles Vaccine? Yes   Zostavax completed No   Shingrix Completed?: No.    Education has been provided regarding the importance of this vaccine. Patient has been advised to call insurance company to determine out of pocket expense if they have not yet received this vaccine. Advised may also receive vaccine at local pharmacy or Health Dept. Verbalized acceptance and understanding.  Screening Tests Health Maintenance  Topic Date Due   Zoster Vaccines- Shingrix (1 of 2) Never done   COLON CANCER SCREENING ANNUAL FOBT  07/21/2019   COVID-19 Vaccine (4 - Booster for Pfizer series) 03/07/2020   INFLUENZA VACCINE  09/01/2021   COLONOSCOPY (Pts 45-68yr Insurance coverage will need to be confirmed)  04/23/2022   TETANUS/TDAP  11/24/2022   Pneumonia Vaccine 70 Years old  Completed   Hepatitis C Screening  Completed   HPV VACCINES  Aged Out    Health Maintenance  Health Maintenance Due  Topic Date Due   Zoster Vaccines- Shingrix (1 of 2) Never done   COLON CANCER SCREENING ANNUAL FOBT  07/21/2019   COVID-19 Vaccine (4 - [place]oster for [place]) 03/07/2020     Colorectal cancer screening: Type of screening: Colonoscopy. Completed 04/22/17. Repeat every 5 years  Lung Cancer Screening: (Low Dose CT Chest recommended if Age 649-80years, 30 pack-year currently smoking OR have quit w/in 15years.) does not qualify.   Additional Screening:  Hepatitis C Screening: does qualify; Completed 11/23/12  Vision Screening: Recommended annual ophthalmology exams for early detection of glaucoma and other disorders of the eye. Is the patient up to date with their annual eye exam?  Yes  Who is the provider or what is the name of the office in which the patient attends annual eye exams? [facility]If pt is not established with a provider, would they  like to be referred to a provider to establish care? No .   Dental Screening: Recommended annual dental exams for proper oral hygiene  Community Resource Referral / Chronic Care Management: CRR required this visit?  No   CCM required this visit?  No      Plan:     I have personally reviewed and noted the following in the patient's chart:   Medical and social history Use of alcohol, tobacco or illicit drugs  Current medications and supplements including opioid prescriptions. Patient is not currently taking opioid prescriptions. Functional ability and status Nutritional status Physical activity Advanced directives List of other physicians Hospitalizations, surgeries, and ER visits in previous 12 months Vitals Screenings to include cognitive, depression, and falls Referrals and appointments  In addition, I have reviewed and discussed with patient certain preventive protocols, quality metrics, and best practice recommendations. A written personalized care plan for preventive services as well as general preventive health recommendations were provided to patient.     Dionisio David, LPN   10/04/4095   Nurse Notes: none

## 2021-07-15 NOTE — Patient Instructions (Signed)
Edward [name] , Thank you for taking time to come for your Medicare Wellness Visit. I appreciate your ongoing commitment to your health goals. Please review the following plan we discussed and let me know if I can assist you in the future.   Screening recommendations/referrals: Colonoscopy: 04/22/17 Recommended yearly ophthalmology/optometry visit for glaucoma screening and checkup Recommended yearly dental visit for hygiene and checkup  Vaccinations: Influenza vaccine: n/d Pneumococcal vaccine: 04/11/19 Tdap vaccine: 11/23/12 Shingles vaccine: n/d   Covid-19: 04/05/19, 06/27/19, 01/11/20  Advanced directives: yes  Conditions/risks identified: none  Next appointment: Follow up in one year for your annual wellness visit. 07/20/22 @ 8:15 am by phone  Preventive Care 65 Years and Older, Male Preventive care refers to lifestyle choices and visits with your health care provider that can promote health and wellness. What does preventive care include? A yearly physical exam. This is also called an annual well check. Dental exams once or twice a year. Routine eye exams. Ask your health care provider how often you should have your eyes checked. Personal lifestyle choices, including: Daily care of your teeth and gums. Regular physical activity. Eating a healthy diet. Avoiding tobacco and drug use. Limiting alcohol use. Practicing safe sex. Taking low doses of aspirin every day. Taking vitamin and mineral supplements as recommended by your health care provider. What happens during an annual well check? The services and screenings done by your health care provider during your annual well check will depend on your age, overall health, lifestyle risk factors, and family history of disease. Counseling  Your health care provider may ask you questions about your: Alcohol use. Tobacco use. Drug use. Emotional well-being. Home and relationship well-being. Sexual activity. Eating habits. History of  falls. Memory and ability to understand (cognition). Work and work Statistician. Screening  You may have the following tests or measurements: Height, weight, and BMI. Blood pressure. Lipid and cholesterol levels. These may be checked every 5 years, or more frequently if you are over 32 years old. Skin check. Lung cancer screening. You may have this screening every year starting at age 35 if you have a 30-pack-year history of smoking and currently smoke or have quit within the past 15 years. Fecal occult blood test (FOBT) of the stool. You may have this test every year starting at age 92. Flexible sigmoidoscopy or colonoscopy. You may have a sigmoidoscopy every 5 years or a colonoscopy every 10 years starting at age 42. Prostate cancer screening. Recommendations will vary depending on your family history and other risks. Hepatitis C blood test. Hepatitis B blood test. Sexually transmitted disease (STD) testing. Diabetes screening. This is done by checking your blood sugar (glucose) after you have not eaten for a while (fasting). You may have this done every 1-3 years. Abdominal aortic aneurysm (AAA) screening. You may need this if you are a current or former smoker. Osteoporosis. You may be screened starting at age 28 if you are at high risk. Talk with your health care provider about your test results, treatment options, and if necessary, the need for more tests. Vaccines  Your health care provider may recommend certain vaccines, such as: Influenza vaccine. This is recommended every year. Tetanus, diphtheria, and acellular pertussis (Tdap, Td) vaccine. You may need a Td booster every 10 years. Zoster vaccine. You may need this after age 47. Pneumococcal 13-valent conjugate (PCV13) vaccine. One dose is recommended after age 61. Pneumococcal polysaccharide (PPSV23) vaccine. One dose is recommended after age 42. Talk to your health care  provider about which screenings and vaccines you need and  how often you need them. This information is not intended to replace advice given to you by your health care provider. Make sure you discuss any questions you have with your health care provider. Document Released: 02/14/2015 Document Revised: 10/08/2015 Document Reviewed: 11/19/2014 Elsevier Interactive Patient Education  2017 [place] Prevention in the Home Falls can cause injuries. They can happen to people of all ages. There are many things you can do to make your home safe and to help prevent falls. What can I do on the outside of my home? Regularly fix the edges of walkways and driveways and fix any cracks. Remove anything that might make you trip as you walk through a door, such as a raised step or threshold. Trim any bushes or trees on the path to your home. Use bright outdoor lighting. Clear any walking paths of anything that might make someone trip, such as rocks or tools. Regularly check to see if handrails are loose or broken. Make sure that both sides of any steps have handrails. Any raised decks and porches should have guardrails on the edges. Have any leaves, snow, or ice cleared regularly. Use sand or salt on walking paths during winter. Clean up any spills in your garage right away. This includes oil or grease spills. What can I do in the bathroom? Use night lights. Install grab bars by the toilet and in the tub and shower. Do not use towel bars as grab bars. Use non-skid mats or decals in the tub or shower. If you need to sit down in the shower, use a plastic, non-slip stool. Keep the floor dry. Clean up any water that spills on the floor as soon as it happens. Remove soap buildup in the tub or shower regularly. Attach bath mats securely with double-sided non-slip rug tape. Do not have throw rugs and other things on the floor that can make you trip. What can I do in the bedroom? Use night lights. Make sure that you have a light by your bed that is easy to  reach. Do not use any sheets or blankets that are too big for your bed. They should not hang down onto the floor. Have a firm chair that has side arms. You can use this for support while you get dressed. Do not have throw rugs and other things on the floor that can make you trip. What can I do in the kitchen? Clean up any spills right away. Avoid walking on wet floors. Keep items that you use a lot in easy-to-reach places. If you need to reach something above you, use a strong step stool that has a grab bar. Keep electrical cords out of the way. Do not use floor polish or wax that makes floors slippery. If you must use wax, use non-skid floor wax. Do not have throw rugs and other things on the floor that can make you trip. What can I do with my stairs? Do not leave any items on the stairs. Make sure that there are handrails on both sides of the stairs and use them. Fix handrails that are broken or loose. Make sure that handrails are as long as the stairways. Check any carpeting to make sure that it is firmly attached to the stairs. Fix any carpet that is loose or worn. Avoid having throw rugs at the top or bottom of the stairs. If you do have throw rugs, attach them to the  floor with carpet tape. Make sure that you have a light switch at the top of the stairs and the bottom of the stairs. If you do not have them, ask someone to add them for you. What else can I do to help prevent falls? Wear shoes that: Do not have high heels. Have rubber bottoms. Are comfortable and fit you well. Are closed at the toe. Do not wear sandals. If you use a stepladder: Make sure that it is fully opened. Do not climb a closed stepladder. Make sure that both sides of the stepladder are locked into place. Ask someone to hold it for you, if possible. Clearly mark and make sure that you can see: Any grab bars or handrails. First and last steps. Where the edge of each step is. Use tools that help you move  around (mobility aids) if they are needed. These include: Canes. Walkers. Scooters. Crutches. Turn on the lights when you go into a dark area. Replace any light bulbs as soon as they burn out. Set up your furniture so you have a clear path. Avoid moving your furniture around. If any of your floors are uneven, fix them. If there are any pets around you, be aware of where they are. Review your medicines with your doctor. Some medicines can make you feel dizzy. This can increase your chance of falling. Ask your doctor what other things that you can do to help prevent falls. This information is not intended to replace advice given to you by your health care provider. Make sure you discuss any questions you have with your health care provider. Document Released: 11/14/2008 Document Revised: 06/26/2015 Document Reviewed: 02/22/2014 Elsevier Interactive Patient Education  2017 [employer].

## 2021-07-20 ENCOUNTER — Other Ambulatory Visit: Payer: Self-pay | Admitting: Internal Medicine

## 2021-07-21 ENCOUNTER — Encounter: Payer: Self-pay | Admitting: Physician Assistant

## 2021-07-21 ENCOUNTER — Ambulatory Visit (INDEPENDENT_AMBULATORY_CARE_PROVIDER_SITE_OTHER): Payer: Medicare Other | Admitting: Physician Assistant

## 2021-07-21 VITALS — BP 129/85 | HR 58 | Ht 70.0 in | Wt 225.9 lb

## 2021-07-21 DIAGNOSIS — H9313 Tinnitus, bilateral: Secondary | ICD-10-CM | POA: Diagnosis not present

## 2021-07-21 DIAGNOSIS — Z125 Encounter for screening for malignant neoplasm of prostate: Secondary | ICD-10-CM

## 2021-07-21 DIAGNOSIS — I2510 Atherosclerotic heart disease of native coronary artery without angina pectoris: Secondary | ICD-10-CM

## 2021-07-21 DIAGNOSIS — R32 Unspecified urinary incontinence: Secondary | ICD-10-CM

## 2021-07-21 DIAGNOSIS — Z0000 Encounter for general adult medical examination without abnormal findings: Secondary | ICD-10-CM

## 2021-07-21 DIAGNOSIS — I5022 Chronic systolic (congestive) heart failure: Secondary | ICD-10-CM

## 2021-07-21 DIAGNOSIS — I10 Essential (primary) hypertension: Secondary | ICD-10-CM

## 2021-07-21 DIAGNOSIS — R739 Hyperglycemia, unspecified: Secondary | ICD-10-CM | POA: Diagnosis not present

## 2021-07-21 DIAGNOSIS — R39198 Other difficulties with micturition: Secondary | ICD-10-CM | POA: Insufficient documentation

## 2021-07-21 DIAGNOSIS — R399 Unspecified symptoms and signs involving the genitourinary system: Secondary | ICD-10-CM

## 2021-07-21 NOTE — Assessment & Plan Note (Signed)
Ref to ENT. Advised he can discuss hearing loss, tinnitus, and nasal congestion. Pt would prefer to see specialist before trying treatment for nasal congestion

## 2021-07-21 NOTE — Progress Notes (Deleted)
I,Sha'taria Urho Rio,acting as a Education administrator for [employer], PA-C.,have documented all relevant documentation on the behalf of Mikey Kirschner, PA-C,as directed by  Mikey Kirschner, PA-C while in the presence of Mikey Kirschner, PA-C.  Annual Wellness Visit     Patient: Edward Reid., Male    DOB: 1950/03/11, 71 y.o.   MRN: [id number] Visit Date: 07/21/2021  Today's Provider: Mikey Kirschner, PA-C   No chief complaint on file.  Subjective    Edward Heir. is a 71 y.o. male who presents today for his Annual Wellness Visit. He reports consuming a {diet types:17450} diet. {Exercise:19826} He generally feels {well/fairly well/poorly:18703}. He reports sleeping {well/fairly well/poorly:18703}. He {does/does not:200015} have additional problems to discuss today.   HPI    Medications: Outpatient Medications Prior to Visit  Medication Sig   atorvastatin (LIPITOR) 80 MG tablet TAKE 1 TABLET EVERY DAY   carvedilol (COREG) 3.125 MG tablet Take 1 tablet (3.125 mg total) by mouth 2 (two) times daily with a meal.   clopidogrel (PLAVIX) 75 MG tablet Take 1 tablet (75 mg total) by mouth daily.   hydrochlorothiazide (HYDRODIURIL) 25 MG tablet TAKE 1 TABLET (25 MG TOTAL) BY MOUTH DAILY.   LORazepam (ATIVAN) 0.5 MG tablet Take 1 tablet (0.5 mg total) by mouth every 8 (eight) hours as needed for anxiety.   losartan (COZAAR) 50 MG tablet TAKE 1 TABLET EVERY DAY   nitroGLYCERIN (NITROSTAT) 0.4 MG SL tablet Place 1 tablet (0.4 mg total) under the tongue every 5 (five) minutes as needed for chest pain. Maximum of 3 doses.   Skin Protectants, Misc. (EUCERIN) cream Apply 1 application topically 3 (three) times daily as needed for dry skin.   tamsulosin (FLOMAX) 0.4 MG CAPS capsule TAKE 1 CAPSULE (0.4 MG TOTAL) BY MOUTH DAILY.   No facility-administered medications prior to visit.    No Known Allergies  Patient Care Team: Pcp, No as PCP - General End, Harrell Gave, MD as PCP - Cardiology  (Cardiology) [name], MD (Inactive) [facility] [place]  Review of Systems  {Labs  Heme  Chem  Endocrine  Serology  Results Review (optional):23779}    Objective    Vitals: There were no vitals taken for this visit. {Show previous vital signs (optional):23777}   Physical Exam ***  Most recent functional status assessment:    07/17/2021    8:58 AM  In your present state of health, do you have any difficulty performing the following activities:  Hearing? 0  Vision? 0  Difficulty concentrating or making decisions? 0  Walking or climbing stairs? 0  Dressing or bathing? 0  Doing errands, shopping? 0  Preparing Food and eating ? N  Using the Toilet? N  In the past six months, have you accidently leaked urine? Y  Do you have problems with loss of bowel control? N  Managing your Medications? N  Managing your Finances? N  Housekeeping or managing your Housekeeping? N   Most recent fall risk assessment:    07/17/2021    8:58 AM  Fall Risk   Falls in the past year? 0  Injury with Fall? 0    Most recent depression screenings:    07/15/2021    8:24 AM 10/15/2019   10:54 AM  PHQ 2/9 Scores  PHQ - 2 Score 0 0  PHQ- 9 Score 0    Most recent cognitive screening:    10/05/2018    2:21 PM  6CIT Screen  What Year? 0  points  What month? 0 points  What time? 0 points  Count back from 20 0 points  Months in reverse 0 points  Repeat phrase 0 points  Total Score 0 points   Most recent Audit-C alcohol use screening    07/17/2021    8:58 AM  Alcohol Use Disorder Test (AUDIT)  1. How often do you have a drink containing alcohol? 3  2. How many drinks containing alcohol do you have on a typical day when you are drinking? 1  3. How often do you have six or more drinks on one occasion? 0  AUDIT-C Score 4   A score of 3 or more in women, and 4 or more in men indicates increased risk for alcohol abuse, EXCEPT if all of the points are from question 1    No results found for any visits on 07/21/21.  Assessment & Plan     Annual wellness visit done today including the all of the following: Reviewed patient's Family Medical History Reviewed and updated list of patient's medical providers Assessment of cognitive impairment was done Assessed patient's functional ability Established a written schedule for health screening [place] Completed and Reviewed  Exercise Activities and Dietary recommendations  Goals      DIET - EAT MORE FRUITS AND VEGETABLES     Exercise 3x per week (30 min per time)     Recommend to exercise for 3 days a week for at least 30 minutes at a time.      Reduce alcohol intake     Recommend to cut back on alcohol intake to no more than 1 drink at a time and no more than 3 days a week.         Immunization History  Administered Date(s) Administered   HiB (PRP-T) 04/11/2019   Influenza,inj,quad, With Preservative 02/02/2019   Influenza-Unspecified 02/01/2018, 01/11/2020   Meningococcal Conjugate 04/11/2019   PFIZER(Purple Top)SARS-COV-2 Vaccination 04/05/2019, 06/27/2019, 01/11/2020   Pneumococcal Conjugate-13 03/25/2017   Pneumococcal Polysaccharide-23 07/21/2018, 04/11/2019   Tdap 11/23/2012    Health Maintenance  Topic Date Due   Zoster Vaccines- Shingrix (1 of 2) Never done   COLON CANCER SCREENING ANNUAL FOBT  07/21/2019   COVID-19 Vaccine (4 - Booster for Pfizer series) 03/07/2020   INFLUENZA VACCINE  09/01/2021   COLONOSCOPY (Pts 45-32yr Insurance coverage will need to be confirmed)  04/23/2022   TETANUS/TDAP  11/24/2022   Pneumonia Vaccine 70 Years old  Completed   Hepatitis C Screening  Completed   HPV VACCINES  Aged Out     Discussed health benefits of physical activity, and encouraged him to engage in regular exercise appropriate for his age and condition.    ***  No follow-ups on file.     {provider attestation***:1}   LMikey Kirschner PA-C  [phone](phone) [phone](fax)  [place]

## 2021-07-21 NOTE — Assessment & Plan Note (Signed)
Chronic, no sign of fluid overload today. Managed by cardiology

## 2021-07-21 NOTE — Assessment & Plan Note (Signed)
Well controlled today.  Managed by cardiology. Ordered cmp

## 2021-07-21 NOTE — Progress Notes (Signed)
I,Edward [name],acting as a Education administrator for [employer], PA-C.,have documented all relevant documentation on the behalf of Edward Kirschner, PA-C,as directed by  Edward Kirschner, PA-C while in the presence of Edward Kirschner, PA-C.   Complete physical exam   Patient: Edward [name].   DOB: [birth year]   71 y.o. Male  MRN: [id number] Visit Date: 07/21/2021  Today's healthcare provider: Mikey Kirschner, PA-C   Cc. cpe  Subjective    Edward [name]. is a 71 y.o. male who presents today for a complete physical exam with a PMH of CAD s/p PCI and STEMI in 9935, chronic systolic heart failure, HTN, HLD, anxiety.   He reports consuming a general diet.  The patient reports he tries to get at least 11,00 steps a day but no routined workout schedule.  He generally feels well. He reports sleeping well. He does have additional problems to discuss today.  HPI   Hearing issues  -Reports tinnitus for years in b/l years, progressive hearing loss bilaterally. Inquires about hearing aids.  Nasal congestion/sinus pressure. -Pt reports when he lies on his side he feels blocked sinuses which causes PND. This only occurs sometimes, usually during allergy season. He states mucous is typically a 'bloody jelly' . Requests ENT referral.   LUTS -Issues emptying bladder, feels the need to sit while urinating. Experiences some dribbling/incontinence occasionally. Reports history of a R sided inguinal hernia that has returned. Takes Tamsulosin currently. Has previously seen urology.   Past Medical History:  Diagnosis Date   Anxiety    Apical mural thrombus    a. s/p 3 months of anticoagulation   BPH (benign prostatic hyperplasia)    Coronary artery disease    a. 03/2016: LM nl, LAD thrombotic occlusion (3.0x38 Resolute Integrity DES), D1 70 (jailed), mild to mod nonobs LCx & RCA dzs; b. MV 7/18: Mod mid/apical antsept defect, no isch, EF 45%->Med Rx; c. 09/2017 Cath: LM nl, LAD patent stent, D1 70ost (jailed), LCX  50p, OM1 100, OM2 30, RCA 30p, RPAV 50, EF 50-55%->Med Rx.   HFrEF (heart failure with reduced ejection fraction) ([place])    a. TTE 2/18:  EF of 30-35% (in setting of MI); b. TTE 5/18: EF 35-40%; c. 10/2017 Echo: EF 40-45%, mid-apicalantsept, ant, ap sev HK, Gr1 DD. Mild BAE.   Hyperlipidemia    Hypertension    Ischemic cardiomyopathy    a. TTE 2/18:  EF of 30-35% with LAD territory AK, likely mural thrombus at the LV apex; b. TTE 5/18: EF 35-40%, mid and apical anterior/anteroseptal AK, no evidence of LV thrombus, Gr1DD, mild BAE, normal RVSF; c. 10/2017 Echo: EF 40-45%, mid-apicalantsept, ant, ap sev HK, Gr1 DD. Mild BAE.   Myocardial infarction [facility])    Past Surgical History:  Procedure Laterality Date   CARDIAC CATHETERIZATION     COLONOSCOPY WITH PROPOFOL N/A 04/22/2017   Procedure: COLONOSCOPY WITH PROPOFOL;  Surgeon: Jonathon Bellows, MD;  Location: [facility] ENDOSCOPY;  Service: Gastroenterology;  Laterality: N/A;   CORONARY ANGIOGRAPHY N/A 03/09/2016   Procedure: Coronary Angiography;  Surgeon: Nelva Bush, MD;  Location: [place] CV LAB;  Service: Cardiovascular;  Laterality: N/A;   CORONARY STENT INTERVENTION N/A 03/09/2016   Procedure: Coronary Stent Intervention;  Surgeon: Nelva Bush, MD;  Location: [place] CV LAB;  Service: Cardiovascular;  Laterality: N/A;   HERNIA REPAIR  2005   HYDROCELE EXCISION     LEFT HEART CATH AND CORONARY ANGIOGRAPHY Left 09/20/2017   Procedure: LEFT HEART CATH AND  CORONARY ANGIOGRAPHY;  Surgeon: Nelva Bush, MD;  Location: [place] CV LAB;  Service: Cardiovascular;  Laterality: Left;   SPLENECTOMY     Social History   Socioeconomic History   Marital status: Married    Spouse name: Not on file   Number of children: 0   Years of education: Not on file   Highest education level: Bachelor's degree (e.g., BA, AB, BS)  Occupational History   Not on file  Tobacco Use   Smoking status: Former    Packs/day: 3.00    Years: 30.00    Total  pack years: 90.00    Types: Cigarettes    Start date: 07/03/2007   Smokeless tobacco: Never   Tobacco comments:    QUIT IN 2005  Vaping Use   Vaping Use: Never used  Substance and Sexual Activity   Alcohol use: Yes    Alcohol/week: 14.0 standard drinks of alcohol    Types: 14 Cans of beer per week    Comment: average of 2 beers per day   Drug use: Yes    Types: Marijuana    Comment: smokes pot occassionally   Sexual activity: Not on file  Other Topics Concern   Not on file  Social History Narrative   Living with family, Independent at baseline   Social Determinants of Health   Financial Resource Strain: Low Risk  (07/15/2021)   Overall Financial Resource Strain (CARDIA)    Difficulty of Paying Living Expenses: Not hard at all  Food Insecurity: No Food Insecurity (07/15/2021)   Hunger Vital Sign    Worried About Running Out of Food in the Last Year: Never true    Ran Out of Food in the Last Year: Never true  Transportation Needs: Unmet Transportation Needs (07/15/2021)   PRAPARE - Transportation    Lack of Transportation (Medical): Yes    Lack of Transportation (Non-Medical): Yes  Physical Activity: Sufficiently Active (07/15/2021)   Exercise Vital Sign    Days of Exercise per Week: 7 days    Minutes of Exercise per Session: 60 min  Stress: Stress Concern Present (07/15/2021)   [place]    Feeling of Stress : To some extent  Social Connections: Moderately Integrated (07/15/2021)   Social Connection and Isolation Panel [NHANES]    Frequency of Communication with Friends and Family: More than three times a week    Frequency of Social Gatherings with Friends and Family: Twice a week    Attends Religious Services: Never    Marine scientist or Organizations: Yes    Attends Music therapist: More than 4 times per year    Marital Status: Married  Human resources officer Violence: Not At Risk (07/15/2021)    Humiliation, Afraid, Rape, and Kick questionnaire    Fear of Current or Ex-Partner: No    Emotionally Abused: No    Physically Abused: No    Sexually Abused: No   Family Status  Relation Name Status   Mother  Deceased   Father  Deceased at age 71   Edward [name]  Deceased   MGF  Deceased   PGM  Deceased   PGF  Deceased   Family History  Problem Relation Age of Onset   Emphysema Mother    Macular degeneration Mother    Heart attack Mother    Heart attack Father    No Known Allergies  Patient Care Team: Pcp, No as PCP - General End, Harrell Gave, MD  as PCP - Cardiology (Cardiology) [name], MD (Inactive) [facility], [place]   Medications: Outpatient Medications Prior to Visit  Medication Sig   atorvastatin (LIPITOR) 80 MG tablet TAKE 1 TABLET EVERY DAY   carvedilol (COREG) 3.125 MG tablet Take 1 tablet (3.125 mg total) by mouth 2 (two) times daily with a meal.   clopidogrel (PLAVIX) 75 MG tablet Take 1 tablet (75 mg total) by mouth daily.   hydrochlorothiazide (HYDRODIURIL) 25 MG tablet TAKE 1 TABLET (25 MG TOTAL) BY MOUTH DAILY.   losartan (COZAAR) 50 MG tablet TAKE 1 TABLET EVERY DAY   nitroGLYCERIN (NITROSTAT) 0.4 MG SL tablet Place 1 tablet (0.4 mg total) under the tongue every 5 (five) minutes as needed for chest pain. Maximum of 3 doses.   Skin Protectants, Misc. (EUCERIN) cream Apply 1 application topically 3 (three) times daily as needed for dry skin.   tamsulosin (FLOMAX) 0.4 MG CAPS capsule TAKE 1 CAPSULE (0.4 MG TOTAL) BY MOUTH DAILY.   [DISCONTINUED] LORazepam (ATIVAN) 0.5 MG tablet Take 1 tablet (0.5 mg total) by mouth every 8 (eight) hours as needed for anxiety.   No facility-administered medications prior to visit.    Review of Systems  HENT:  Positive for hearing loss, postnasal drip, sinus pressure and tinnitus.   Eyes:  Positive for redness.  Gastrointestinal:  Positive for abdominal distention and abdominal pain.  Endocrine: Positive  for polyuria.  Genitourinary:  Positive for difficulty urinating, frequency, scrotal swelling and urgency.  Musculoskeletal:  Positive for neck pain and neck stiffness.      Objective     Blood pressure 129/85, pulse (!) 58, height '5\' 10"'$  (1.778 m), weight 225 lb 14.4 oz (102.5 kg), SpO2 98 %.    Physical Exam Constitutional:      General: He is awake.     Appearance: He is well-developed.  HENT:     Head: Normocephalic.     Right Ear: Tympanic membrane, ear canal and external ear normal.     Left Ear: Ear canal and external ear normal. There is impacted cerumen.     Nose: Nose normal. No congestion or rhinorrhea.     Mouth/Throat:     Mouth: Mucous membranes are moist.     Pharynx: No oropharyngeal exudate or posterior oropharyngeal erythema.  Eyes:     Pupils: Pupils are equal, round, and reactive to light.  Cardiovascular:     Rate and Rhythm: Normal rate and regular rhythm.     Heart sounds: Normal heart sounds.  Pulmonary:     Effort: Pulmonary effort is normal.     Breath sounds: Normal breath sounds.  Abdominal:     General: There is no distension.     Palpations: Abdomen is soft.     Tenderness: There is no abdominal tenderness. There is no guarding.  Musculoskeletal:     Cervical back: Normal range of motion.     Right lower leg: No edema.     Left lower leg: No edema.  Lymphadenopathy:     Cervical: No cervical adenopathy.  Skin:    General: Skin is warm.  Neurological:     Mental Status: He is alert and oriented to person, place, and time.  Psychiatric:        Attention and Perception: Attention normal.        Mood and Affect: Mood normal.        Speech: Speech normal.        Behavior: Behavior normal.  Behavior is cooperative.      Last depression screening scores    07/21/2021    2:18 PM 07/15/2021    8:24 AM 10/15/2019   10:54 AM  PHQ 2/9 Scores  PHQ - 2 Score 0 0 0  PHQ- 9 Score 0 0    Last fall risk screening    07/21/2021    2:18 PM   [place] in the past year? 0  Number falls in past yr: 0  Injury with Fall? 0  Risk for fall due to : No Fall Risks   Last Audit-C alcohol use screening    07/21/2021    2:18 PM  Alcohol Use Disorder Test (AUDIT)  1. How often do you have a drink containing alcohol? 4  2. How many drinks containing alcohol do you have on a typical day when you are drinking? 1  3. How often do you have six or more drinks on one occasion? 0  AUDIT-C Score 5   A score of 3 or more in women, and 4 or more in men indicates increased risk for alcohol abuse, EXCEPT if all of the points are from question 1   No results found for any visits on 07/21/21.  Assessment & Plan    Routine Health Maintenance and Physical Exam  Exercise Activities and Dietary recommendations  Goals      DIET - EAT MORE FRUITS AND VEGETABLES     Exercise 3x per week (30 min per time)     Recommend to exercise for 3 days a week for at least 30 minutes at a time.      Reduce alcohol intake     Recommend to cut back on alcohol intake to no more than 1 drink at a time and no more than 3 days a week.         Immunization History  Administered Date(s) Administered   HiB (PRP-T) 04/11/2019   Influenza,inj,quad, With Preservative 02/02/2019   Influenza-Unspecified 02/01/2018, 01/11/2020   Meningococcal Conjugate 04/11/2019   PFIZER(Purple Top)SARS-COV-2 Vaccination 04/05/2019, 06/27/2019, 01/11/2020   Pfizer Covid-19 Vaccine Bivalent Booster 72yr & up 11/13/2020   Pneumococcal Conjugate-13 03/25/2017   Pneumococcal Polysaccharide-23 07/21/2018, 04/11/2019   Tdap 11/23/2012    Health Maintenance  Topic Date Due   Zoster Vaccines- Shingrix (1 of 2) 10/21/2021 (Originally 12/16/1969)   INFLUENZA VACCINE  09/01/2021   COLONOSCOPY (Pts 45-456yrInsurance coverage will need to be confirmed)  04/23/2022   TETANUS/TDAP  11/24/2022   Pneumonia Vaccine 6530Years old  Completed   COVID-19 Vaccine  Completed    Hepatitis C Screening  Completed   HPV VACCINES  Aged Out   COLON CANCER SCREENING ANNUAL FOBT  Discontinued    Discussed health benefits of physical activity, and encouraged him to engage in regular exercise appropriate for his age and condition.  Problem List Items Addressed This Visit       Cardiovascular and Mediastinum   Essential hypertension    Well controlled today.  Managed by cardiology. Ordered cmp      Relevant Orders   Comprehensive Metabolic Panel (CMET)   Chronic HFrEF (heart failure with reduced ejection fraction) (HCC)    Chronic, no sign of fluid overload today. Managed by cardiology      Relevant Orders   CBC w/Diff/Platelet   Comprehensive Metabolic Panel (CMET)   Coronary artery disease involving native coronary artery of native heart without angina pectoris    Managed w/ lipitor Will check  fasting lipids       Relevant Orders   Lipid Profile     Other   Difficulty urinating    PSA ordered Continue flomax Ref back to urology for management      Tinnitus of both ears    Ref to ENT. Advised he can discuss hearing loss, tinnitus, and nasal congestion. Pt would prefer to see specialist before trying treatment for nasal congestion      Relevant Orders   Ambulatory referral to ENT   Other Visit Diagnoses     Annual physical exam    -  Primary   Hyperglycemia       Relevant Orders   HgB A1c   Urinary incontinence, unspecified type       Relevant Orders   PSA   Prostate cancer screening       Relevant Orders   PSA        Return in about 1 year (around 07/22/2022) for CPE.     I, Edward Kirschner, PA-C have reviewed all documentation for this visit. The documentation on  07/21/2021 for the exam, diagnosis, procedures, and orders are all accurate and complete.  Edward Kirschner, PA-C [facility] [address] [place], [place], [address] Office: [phone] Fax: [place]

## 2021-07-21 NOTE — Assessment & Plan Note (Signed)
Managed w/ lipitor Will check fasting lipids

## 2021-07-21 NOTE — Assessment & Plan Note (Signed)
PSA ordered Continue flomax Ref back to urology for management

## 2021-07-22 DIAGNOSIS — I5022 Chronic systolic (congestive) heart failure: Secondary | ICD-10-CM | POA: Diagnosis not present

## 2021-07-22 DIAGNOSIS — I10 Essential (primary) hypertension: Secondary | ICD-10-CM | POA: Diagnosis not present

## 2021-07-22 DIAGNOSIS — Z125 Encounter for screening for malignant neoplasm of prostate: Secondary | ICD-10-CM | POA: Diagnosis not present

## 2021-07-22 DIAGNOSIS — I2510 Atherosclerotic heart disease of native coronary artery without angina pectoris: Secondary | ICD-10-CM | POA: Diagnosis not present

## 2021-07-22 DIAGNOSIS — R32 Unspecified urinary incontinence: Secondary | ICD-10-CM | POA: Diagnosis not present

## 2021-07-22 DIAGNOSIS — R739 Hyperglycemia, unspecified: Secondary | ICD-10-CM | POA: Diagnosis not present

## 2021-07-23 LAB — COMPREHENSIVE METABOLIC PANEL
ALT: 21 IU/L (ref 0–44)
AST: 18 IU/L (ref 0–40)
Albumin/Globulin Ratio: 1.8 (ref 1.2–2.2)
Albumin: 4.4 g/dL (ref 3.8–4.8)
Alkaline Phosphatase: 46 IU/L (ref 44–121)
BUN/Creatinine Ratio: 18 (ref 10–24)
BUN: 15 mg/dL (ref 8–27)
Bilirubin Total: 0.9 mg/dL (ref 0.0–1.2)
CO2: 22 mmol/L (ref 20–29)
Calcium: 9.5 mg/dL (ref 8.6–10.2)
Chloride: 101 mmol/L (ref 96–106)
Creatinine, Ser: 0.83 mg/dL (ref 0.76–1.27)
Globulin, Total: 2.5 g/dL (ref 1.5–4.5)
Glucose: 107 mg/dL — ABNORMAL HIGH (ref 70–99)
Potassium: 3.9 mmol/L (ref 3.5–5.2)
Sodium: 139 mmol/L (ref 134–144)
Total Protein: 6.9 g/dL (ref 6.0–8.5)
eGFR: 95 mL/min/{1.73_m2} (ref >59)

## 2021-07-23 LAB — CBC WITH DIFFERENTIAL/PLATELET
Basophils Absolute: 0.1 10*3/uL (ref 0.0–0.2)
Basos: 1 %
EOS (ABSOLUTE): 0.2 10*3/uL (ref 0.0–0.4)
Eos: 3 %
Hematocrit: 44.4 % (ref 37.5–51.0)
Hemoglobin: 15.2 g/dL (ref 13.0–17.7)
Immature Grans (Abs): 0 10*3/uL (ref 0.0–0.1)
Immature Granulocytes: 0 %
Lymphocytes Absolute: 2.5 10*3/uL (ref 0.7–3.1)
Lymphs: 37 %
MCH: 32.4 pg (ref 26.6–33.0)
MCHC: 34.2 g/dL (ref 31.5–35.7)
MCV: 95 fL (ref 79–97)
Monocytes Absolute: 0.8 10*3/uL (ref 0.1–0.9)
Monocytes: 12 %
Neutrophils Absolute: 3.1 10*3/uL (ref 1.4–7.0)
Neutrophils: 47 %
Platelets: 331 10*3/uL (ref 150–450)
RBC: 4.69 x10E6/uL (ref 4.14–5.80)
RDW: 13.1 % (ref 11.6–15.4)
WBC: 6.7 10*3/uL (ref 3.4–10.8)

## 2021-07-23 LAB — LIPID PANEL
Chol/HDL Ratio: 2.3 ratio (ref 0.0–5.0)
Cholesterol, Total: 109 mg/dL (ref 100–199)
HDL: 47 mg/dL (ref >39)
LDL Chol Calc (NIH): 48 mg/dL (ref 0–99)
Triglycerides: 65 mg/dL (ref 0–149)
VLDL Cholesterol Cal: 14 mg/dL (ref 5–40)

## 2021-07-23 LAB — HEMOGLOBIN A1C
Est. average glucose Bld gHb Est-mCnc: 123 mg/dL
Hgb A1c MFr Bld: 5.9 % — ABNORMAL HIGH (ref 4.8–5.6)

## 2021-07-23 LAB — PSA: Prostate Specific Ag, Serum: 0.6 ng/mL (ref 0.0–4.0)

## 2021-07-24 ENCOUNTER — Telehealth: Payer: Self-pay | Admitting: *Deleted

## 2021-07-31 DIAGNOSIS — Z09 Encounter for follow-up examination after completed treatment for conditions other than malignant neoplasm: Secondary | ICD-10-CM | POA: Diagnosis not present

## 2021-07-31 DIAGNOSIS — L578 Other skin changes due to chronic exposure to nonionizing radiation: Secondary | ICD-10-CM | POA: Diagnosis not present

## 2021-07-31 DIAGNOSIS — D225 Melanocytic nevi of trunk: Secondary | ICD-10-CM | POA: Diagnosis not present

## 2021-07-31 DIAGNOSIS — L738 Other specified follicular disorders: Secondary | ICD-10-CM | POA: Diagnosis not present

## 2021-07-31 DIAGNOSIS — L821 Other seborrheic keratosis: Secondary | ICD-10-CM | POA: Diagnosis not present

## 2021-07-31 DIAGNOSIS — Z86018 Personal history of other benign neoplasm: Secondary | ICD-10-CM | POA: Diagnosis not present

## 2021-07-31 DIAGNOSIS — L570 Actinic keratosis: Secondary | ICD-10-CM | POA: Diagnosis not present

## 2021-08-01 ENCOUNTER — Other Ambulatory Visit: Payer: Self-pay | Admitting: Internal Medicine
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
## 2021-08-12 ENCOUNTER — Encounter: Payer: Self-pay | Admitting: Medical

## 2021-08-12 ENCOUNTER — Ambulatory Visit (INDEPENDENT_AMBULATORY_CARE_PROVIDER_SITE_OTHER): Payer: Medicare Other | Admitting: Medical

## 2021-08-12 ENCOUNTER — Other Ambulatory Visit: Payer: Self-pay | Admitting: Internal Medicine

## 2021-08-12 VITALS — BP 128/78 | HR 57 | Ht 70.0 in | Wt 227.4 lb

## 2021-08-12 DIAGNOSIS — I255 Ischemic cardiomyopathy: Secondary | ICD-10-CM

## 2021-08-12 DIAGNOSIS — E782 Mixed hyperlipidemia: Secondary | ICD-10-CM | POA: Diagnosis not present

## 2021-08-12 DIAGNOSIS — I10 Essential (primary) hypertension: Secondary | ICD-10-CM

## 2021-08-12 DIAGNOSIS — I5022 Chronic systolic (congestive) heart failure: Secondary | ICD-10-CM

## 2021-08-12 DIAGNOSIS — I2510 Atherosclerotic heart disease of native coronary artery without angina pectoris: Secondary | ICD-10-CM | POA: Diagnosis not present

## 2021-08-12 NOTE — Patient Instructions (Signed)
Medication Instructions:  Your physician recommends that you continue on your current medications as directed. Please refer to the Current Medication list given to you today.  *If you need a refill on your cardiac medications before your next appointment, please call your pharmacy*   Lab Work: None ordered If you have labs (blood work) drawn today and your tests are completely normal, you will receive your results only by: [place] (if you have MyChart) OR A paper copy in the mail If you have any lab test that is abnormal or we need to change your treatment, we will call you to review the results.   Testing/Procedures: None ordered   Follow-Up: At [facility], you and your health needs are our priority.  As part of our continuing mission to provide you with exceptional heart care, we have created designated Provider Care Teams.  These Care Teams include your primary Cardiologist (physician) and Advanced Practice Providers (APPs -  Physician Assistants and Nurse Practitioners) who all work together to provide you with the care you need, when you need it.  We recommend signing up for the patient portal called "MyChart".  Sign up information is provided on this After Visit Summary.  MyChart is used to connect with patients for Virtual Visits (Telemedicine).  Patients are able to view lab/test results, encounter notes, upcoming appointments, etc.  Non-urgent messages can be sent to your provider as well.   To learn more about what you can do with MyChart, go to [email or web address].    Your next appointment:   Your physician wants you to follow-up in: 6 months You will receive a reminder letter in the mail two months in advance. If you don't receive a letter, please call our office to schedule the follow-up appointment.   The format for your next appointment:   In Person  Provider:   You may see Nelva Bush, MD or one of the following Advanced Practice Providers on your  designated Care Team:   Murray Hodgkins, NP Christell Faith, PA-C Cadence Kathlen Mody, [place]   Other Instructions N/A  Important Information About Sugar

## 2021-08-12 NOTE — Telephone Encounter (Signed)
Please refill during today's office visit if appropriate. Thank you!

## 2021-08-17 DIAGNOSIS — H903 Sensorineural hearing loss, bilateral: Secondary | ICD-10-CM | POA: Diagnosis not present

## 2021-08-17 DIAGNOSIS — J342 Deviated nasal septum: Secondary | ICD-10-CM | POA: Diagnosis not present

## 2021-08-17 DIAGNOSIS — J302 Other seasonal allergic rhinitis: Secondary | ICD-10-CM | POA: Diagnosis not present

## 2021-08-17 DIAGNOSIS — H905 Unspecified sensorineural hearing loss: Secondary | ICD-10-CM | POA: Diagnosis not present

## 2021-08-17 DIAGNOSIS — H6123 Impacted cerumen, bilateral: Secondary | ICD-10-CM | POA: Diagnosis not present

## 2021-08-24 DIAGNOSIS — J301 Allergic rhinitis due to pollen: Secondary | ICD-10-CM | POA: Diagnosis not present

## 2021-08-27 ENCOUNTER — Other Ambulatory Visit: Payer: Self-pay | Admitting: Internal Medicine

## 2021-08-27 ENCOUNTER — Encounter: Payer: Self-pay | Admitting: Urology

## 2021-08-27 ENCOUNTER — Ambulatory Visit (INDEPENDENT_AMBULATORY_CARE_PROVIDER_SITE_OTHER): Payer: Medicare Other | Admitting: Urology

## 2021-08-27 VITALS — BP 118/79 | HR 56 | Ht 70.0 in | Wt 222.0 lb

## 2021-08-27 DIAGNOSIS — N401 Enlarged prostate with lower urinary tract symptoms: Secondary | ICD-10-CM | POA: Diagnosis not present

## 2021-08-27 DIAGNOSIS — Z125 Encounter for screening for malignant neoplasm of prostate: Secondary | ICD-10-CM | POA: Diagnosis not present

## 2021-08-27 DIAGNOSIS — N138 Other obstructive and reflux uropathy: Secondary | ICD-10-CM

## 2021-08-27 DIAGNOSIS — R399 Unspecified symptoms and signs involving the genitourinary system: Secondary | ICD-10-CM | POA: Diagnosis not present

## 2021-08-27 LAB — BLADDER SCAN AMB NON-IMAGING

## 2021-08-27 NOTE — Progress Notes (Signed)
   08/27/2021 10:25 AM   Edward [name]. [birth year] [id number]  Reason for visit: Follow up lower urinary tract symptoms, PSA screening  HPI: 71 year old male who I previously saw in May 2022 for a possible filling defect in the bladder on CT.  He underwent cystoscopy at that time which was benign showing a small prostate with lateral lobe hypertrophy, mild trabeculations, no suspicious lesions.  Cytology was negative.  He has been on Flomax long-term for mild urinary symptoms of weak stream.  He was noticing some problems with leakage and made the appointment, however he noticed that he was having some postvoid dribbling and since he has been sitting to void this has resolved.  He really denies any significant bothersome urinary symptoms today.  He has nocturia 1-3 times overnight depending on his fluid and alcohol intake before bed.  Overall, he has minimal urinary complaints.  He has been on Flomax long-term, and is unsure if this helps with his urination.  PVR today is normal at 75 mL.  I personally viewed and interpreted the prior CT from March 2022, and prostate volume is 22 g.  We discussed options including a trial off Flomax, avoiding bladder irritants, and even reviewed UroLift as a possible option if he has worsening urinary symptoms off the Flomax.  He is minimally bothered at this time and would like to start by stopping the Flomax and see how his urinary symptoms respond.  We discussed he could resume Flomax, or consider UroLift if he wanted to get off of medications for his mild urinary symptoms.  He will let us know how stopping the Flomax goes.  Regarding PSA screening, reassurance was provided regarding his normal PSA values, most recently 0.6 in June 2023 which is stable over the last 5 years.  We reviewed the AUA guidelines regarding PSA screening, and that routine screening is not recommended in men over age 3.   -Trial off of Flomax, okay to resume if urinary symptoms worsen,  could also consider UroLift if worsening urinary symptoms off or despite Flomax -Continue yearly PSA screening with PCP, consider discontinuing screening after next year -RTC 1 year PVR   Billey Co, MD  [facility] [address] [place], [place] [address] [phone]

## 2021-08-27 NOTE — Patient Instructions (Signed)

## 2021-08-28 LAB — URINALYSIS, COMPLETE
Bilirubin, UA: NEGATIVE
Glucose, UA: NEGATIVE
Ketones, UA: NEGATIVE
Leukocytes,UA: NEGATIVE
Nitrite, UA: NEGATIVE
Protein,UA: NEGATIVE
RBC, UA: NEGATIVE
Specific Gravity, UA: 1.01 (ref 1.005–1.030)
Urobilinogen, Ur: 0.2 mg/dL (ref 0.2–1.0)
pH, UA: 6.5 (ref 5.0–7.5)

## 2021-08-28 LAB — MICROSCOPIC EXAMINATION
Bacteria, UA: NONE SEEN
WBC, UA: NONE SEEN /hpf (ref 0–5)

## 2021-09-22 DIAGNOSIS — J342 Deviated nasal septum: Secondary | ICD-10-CM | POA: Diagnosis not present

## 2021-09-22 DIAGNOSIS — H905 Unspecified sensorineural hearing loss: Secondary | ICD-10-CM | POA: Diagnosis not present

## 2021-09-22 DIAGNOSIS — J302 Other seasonal allergic rhinitis: Secondary | ICD-10-CM | POA: Diagnosis not present

## 2021-09-26 ENCOUNTER — Other Ambulatory Visit: Payer: Self-pay | Admitting: Internal Medicine

## 2021-10-16 ENCOUNTER — Other Ambulatory Visit: Payer: Self-pay | Admitting: Family Medicine

## 2021-10-16 DIAGNOSIS — R351 Nocturia: Secondary | ICD-10-CM

## 2021-12-07 ENCOUNTER — Encounter: Payer: Self-pay | Admitting: Physician Assistant

## 2021-12-07 ENCOUNTER — Ambulatory Visit (INDEPENDENT_AMBULATORY_CARE_PROVIDER_SITE_OTHER): Payer: Medicare Other | Admitting: Physician Assistant

## 2021-12-07 VITALS — BP 136/83 | HR 52 | Wt 226.8 lb

## 2021-12-07 DIAGNOSIS — L570 Actinic keratosis: Secondary | ICD-10-CM

## 2021-12-07 NOTE — Assessment & Plan Note (Signed)
On left side of chest Advised they can be precancerous Ref to derm

## 2021-12-07 NOTE — Progress Notes (Signed)
     I,Sha'taria Tyson,acting as a Education administrator for [employer], PA-C.,have documented all relevant documentation on the behalf of Mikey Kirschner, PA-C,as directed by  Mikey Kirschner, PA-C while in the presence of Mikey Kirschner, PA-C.   Established patient visit   Patient: Edward [name].   DOB: 08/23/1950   71 y.o. Male  MRN: [id number] Visit Date: 12/07/2021  Today's healthcare provider: Mikey Kirschner, PA-C   Cc. Skin lesions  Subjective     Pt reports two new skin lesions that catch on his clothes and can be itching. Reports they have grown since he has noticed them, over a month ago. Denies bleeding, pain, rash.  Medications: Outpatient Medications Prior to Visit  Medication Sig   atorvastatin (LIPITOR) 80 MG tablet TAKE 1 TABLET EVERY DAY   carvedilol (COREG) 3.125 MG tablet TAKE 1 TABLET (3.125 MG TOTAL) BY MOUTH 2 (TWO) TIMES DAILY WITH A MEAL.   clopidogrel (PLAVIX) 75 MG tablet TAKE 1 TABLET (75 MG TOTAL) BY MOUTH DAILY.   hydrochlorothiazide (HYDRODIURIL) 25 MG tablet TAKE 1 TABLET (25 MG TOTAL) BY MOUTH DAILY.   losartan (COZAAR) 50 MG tablet TAKE 1 TABLET EVERY DAY   nitroGLYCERIN (NITROSTAT) 0.4 MG SL tablet Place 1 tablet (0.4 mg total) under the tongue every 5 (five) minutes as needed for chest pain. Maximum of 3 doses.   Skin Protectants, Misc. (EUCERIN) cream Apply 1 application topically 3 (three) times daily as needed for dry skin.   tamsulosin (FLOMAX) 0.4 MG CAPS capsule TAKE 1 CAPSULE EVERY DAY   No facility-administered medications prior to visit.    Review of Systems  Skin:  Positive for rash.     Objective    Blood pressure 136/83, pulse (!) 52, weight 226 lb 12.8 oz (102.9 kg), SpO2 100 %.   Physical Exam Vitals reviewed.  Constitutional:      Appearance: He is not ill-appearing.  HENT:     Head: Normocephalic.  Eyes:     Conjunctiva/sclera: Conjunctivae normal.  Cardiovascular:     Rate and Rhythm: Normal rate.  Pulmonary:     Effort:  Pulmonary effort is normal. No respiratory distress.  Skin:    Comments: Left side of chest w/ two 1-2 mm raised white scaled papules  Neurological:     General: No focal deficit present.     Mental Status: He is alert and oriented to person, place, and time.  Psychiatric:        Mood and Affect: Mood normal.        Behavior: Behavior normal.      No results found for any visits on 12/07/21.  Assessment & Plan     Problem List Items Addressed This Visit       Musculoskeletal and Integument   Actinic keratoses - Primary    On left side of chest Advised they can be precancerous Ref to derm      Relevant Orders   Ambulatory referral to Dermatology     Return if symptoms worsen or fail to improve.      I, Mikey Kirschner, PA-C have reviewed all documentation for this visit. The documentation on  12/07/2021 for the exam, diagnosis, procedures, and orders are all accurate and complete.  Mikey Kirschner, PA-C [facility] [address] [place], [place], [address] Office: [phone] Fax: [place]

## 2021-12-21 ENCOUNTER — Other Ambulatory Visit: Payer: Self-pay | Admitting: Internal Medicine

## 2021-12-21 NOTE — Telephone Encounter (Signed)
Good Afternoon,  Could you please schedule this patient a 6 month follow up visit? The patient was last seen by Cadence on 08-12-21 and she wanted him to follow up in 6 months. Thank you so much.

## 2021-12-22 DIAGNOSIS — Z23 Encounter for immunization: Secondary | ICD-10-CM | POA: Diagnosis not present

## 2022-01-19 DIAGNOSIS — L821 Other seborrheic keratosis: Secondary | ICD-10-CM | POA: Diagnosis not present

## 2022-01-19 DIAGNOSIS — D2262 Melanocytic nevi of left upper limb, including shoulder: Secondary | ICD-10-CM | POA: Diagnosis not present

## 2022-01-19 DIAGNOSIS — L820 Inflamed seborrheic keratosis: Secondary | ICD-10-CM | POA: Diagnosis not present

## 2022-01-19 DIAGNOSIS — D171 Benign lipomatous neoplasm of skin and subcutaneous tissue of trunk: Secondary | ICD-10-CM | POA: Diagnosis not present

## 2022-01-19 DIAGNOSIS — D225 Melanocytic nevi of trunk: Secondary | ICD-10-CM | POA: Diagnosis not present

## 2022-01-19 DIAGNOSIS — D2261 Melanocytic nevi of right upper limb, including shoulder: Secondary | ICD-10-CM | POA: Diagnosis not present

## 2022-01-19 DIAGNOSIS — L0212 Furuncle of neck: Secondary | ICD-10-CM | POA: Diagnosis not present

## 2022-01-19 DIAGNOSIS — L298 Other pruritus: Secondary | ICD-10-CM | POA: Diagnosis not present

## 2022-01-19 DIAGNOSIS — L538 Other specified erythematous conditions: Secondary | ICD-10-CM | POA: Diagnosis not present

## 2022-02-10 DIAGNOSIS — L538 Other specified erythematous conditions: Secondary | ICD-10-CM | POA: Diagnosis not present

## 2022-02-10 DIAGNOSIS — L820 Inflamed seborrheic keratosis: Secondary | ICD-10-CM | POA: Diagnosis not present
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
## 2022-02-18 ENCOUNTER — Ambulatory Visit: Payer: Medicare Other | Attending: Internal Medicine | Admitting: Internal Medicine

## 2022-02-18 ENCOUNTER — Encounter: Payer: Self-pay | Admitting: Internal Medicine

## 2022-02-18 VITALS — BP 114/68 | HR 63 | Ht 70.0 in | Wt 226.0 lb

## 2022-02-18 DIAGNOSIS — I255 Ischemic cardiomyopathy: Secondary | ICD-10-CM | POA: Diagnosis not present

## 2022-02-18 DIAGNOSIS — I5022 Chronic systolic (congestive) heart failure: Secondary | ICD-10-CM | POA: Diagnosis not present

## 2022-02-18 DIAGNOSIS — E785 Hyperlipidemia, unspecified: Secondary | ICD-10-CM | POA: Diagnosis not present

## 2022-02-18 DIAGNOSIS — I10 Essential (primary) hypertension: Secondary | ICD-10-CM | POA: Insufficient documentation

## 2022-02-18 DIAGNOSIS — I2510 Atherosclerotic heart disease of native coronary artery without angina pectoris: Secondary | ICD-10-CM | POA: Diagnosis not present

## 2022-02-18 NOTE — Patient Instructions (Signed)
Medication Instructions:  Your physician recommends that you continue on your current medications as directed. Please refer to the Current Medication list given to you today.  *If you need a refill on your cardiac medications before your next appointment, please call your pharmacy*   Lab Work: No labs ordered  If you have labs (blood work) drawn today and your tests are completely normal, you will receive your results only by: MyChart Message (if you have MyChart) OR A paper copy in the mail If you have any lab test that is abnormal or we need to change your treatment, we will call you to review the results.   Testing/Procedures: No testing ordered  Follow-Up: At [facility], you and your health needs are our priority.  As part of our continuing mission to provide you with exceptional heart care, we have created designated Provider Care Teams.  These Care Teams include your primary Cardiologist (physician) and Advanced Practice Providers (APPs -  Physician Assistants and Nurse Practitioners) who all work together to provide you with the care you need, when you need it.  We recommend signing up for the patient portal called "MyChart".  Sign up information is provided on this After Visit Summary.  MyChart is used to connect with patients for Virtual Visits (Telemedicine).  Patients are able to view lab/test results, encounter notes, upcoming appointments, etc.  Non-urgent messages can be sent to your provider as well.   To learn more about what you can do with MyChart, go to [email or web address].    Your next appointment:   6 month(s)  Provider:   You may see Christopher End, MD or one of the following Advanced Practice Providers on your designated Care Team:   Christopher Berge, NP Ryan Dunn, PA-C Cadence Furth, PA-C Sheri Hammock, NP  

## 2022-02-19 ENCOUNTER — Encounter: Payer: Self-pay | Admitting: Internal Medicine

## 2022-06-07 ENCOUNTER — Telehealth: Payer: Self-pay | Admitting: Physician Assistant

## 2022-06-07 ENCOUNTER — Telehealth: Payer: Self-pay | Admitting: Internal Medicine

## 2022-06-07 ENCOUNTER — Other Ambulatory Visit: Payer: Self-pay

## 2022-06-07 MED ORDER — CARVEDILOL 3.125 MG PO TABS: 3.1250 mg | ORAL_TABLET | Freq: Two times a day (BID) | ORAL | 3 refills | Status: DC

## 2022-06-07 MED ORDER — CLOPIDOGREL BISULFATE 75 MG PO TABS: 75.0000 mg | ORAL_TABLET | Freq: Every day | ORAL | 3 refills | Status: DC

## 2022-06-07 MED ORDER — HYDROCHLOROTHIAZIDE 25 MG PO TABS: 25.0000 mg | ORAL_TABLET | Freq: Every day | ORAL | 3 refills | Status: DC

## 2022-06-07 MED ORDER — ATORVASTATIN CALCIUM 80 MG PO TABS: 80.0000 mg | ORAL_TABLET | Freq: Every day | ORAL | 3 refills | Status: DC

## 2022-06-07 MED ORDER — LOSARTAN POTASSIUM 50 MG PO TABS: 50.0000 mg | ORAL_TABLET | Freq: Every day | ORAL | 3 refills | Status: DC

## 2022-06-07 NOTE — Telephone Encounter (Signed)
Requested Prescriptions   Signed Prescriptions Disp Refills   atorvastatin (LIPITOR) 80 MG tablet 90 tablet 3    Sig: Take 1 tablet (80 mg total) by mouth daily.    Authorizing Provider: END, CHRISTOPHER    Ordering User: Margrett Rud   carvedilol (COREG) 3.125 MG tablet 180 tablet 3    Sig: Take 1 tablet (3.125 mg total) by mouth 2 (two) times daily with a meal.    Authorizing Provider: END, CHRISTOPHER    Ordering User: Margrett Rud   clopidogrel (PLAVIX) 75 MG tablet 90 tablet 3    Sig: Take 1 tablet (75 mg total) by mouth daily.    Authorizing Provider: END, CHRISTOPHER    Ordering User: Margrett Rud   hydrochlorothiazide (HYDRODIURIL) 25 MG tablet 90 tablet 3    Sig: Take 1 tablet (25 mg total) by mouth daily.    Authorizing Provider: END, CHRISTOPHER    Ordering User: Margrett Rud   losartan (COZAAR) 50 MG tablet 90 tablet 3    Sig: Take 1 tablet (50 mg total) by mouth daily.    Authorizing Provider: END, CHRISTOPHER    Ordering User: Margrett Rud

## 2022-06-07 NOTE — Telephone Encounter (Signed)
Pt is calling to request a referral GI for Colonoscopy. CB- [phone]

## 2022-06-07 NOTE — Telephone Encounter (Signed)
*  STAT* If patient is at the pharmacy, call can be transferred to refill team.   1. Which medications need to be refilled? (please list name of each medication and dose if known)  atorvastatin (LIPITOR) 80 MG tablet  carvedilol (COREG) 3.125 MG tablet   clopidogrel (PLAVIX) 75 MG tablet  hydrochlorothiazide (HYDRODIURIL) 25 MG tablet   losartan (COZAAR) 50 MG tablet   2. Which pharmacy/location (including street and city if local pharmacy) is medication to be sent to?  [facility] - [place], [place] - [address] [facility] AT Portal to Registered [facility] Sites Phone: [phone]  Fax: [phone]      3. Do they need a 30 day or 90 day supply? 90 day

## 2022-06-08 ENCOUNTER — Other Ambulatory Visit: Payer: Self-pay | Admitting: Physician Assistant

## 2022-06-08 ENCOUNTER — Telehealth: Payer: Self-pay | Admitting: Internal Medicine

## 2022-06-08 ENCOUNTER — Other Ambulatory Visit: Payer: Self-pay | Admitting: *Deleted

## 2022-06-08 ENCOUNTER — Telehealth: Payer: Self-pay

## 2022-06-08 DIAGNOSIS — Z86010 Personal history of colonic polyps: Secondary | ICD-10-CM

## 2022-06-08 DIAGNOSIS — Z1211 Encounter for screening for malignant neoplasm of colon: Secondary | ICD-10-CM

## 2022-06-08 MED ORDER — NA SULFATE-K SULFATE-MG SULF 17.5-3.13-1.6 GM/177ML PO SOLN: 1.0000 | Freq: Once | ORAL | 0 refills | Status: AC

## 2022-06-08 NOTE — Telephone Encounter (Signed)
   Name: Edward [name].  DOB: [birth year]  MRN: [id number]  Primary Cardiologist: Yvonne Kendall, MD   Preoperative team, please contact this patient and set up a phone call appointment for further preoperative risk assessment. Please obtain consent and complete medication review. Thank you for your help.  I confirm that guidance regarding antiplatelet and oral anticoagulation therapy has been completed and, if necessary, noted below.  Per office protocol, he may hold Plavix for 5-7 days prior to procedure and should resume as soon as hemodynamically stable postoperatively.    Carlos Levering, NP 06/08/2022, 5:18 PM [facility]

## 2022-06-08 NOTE — Telephone Encounter (Signed)
Gastroenterology Pre-Procedure Review  Request Date: 07/13/2022 Requesting Physician: Dr. Tobi Bastos  PATIENT REVIEW QUESTIONS: The patient responded to the following health history questions as indicated:    1. Are you having any GI issues? no 2. Do you have a personal history of Polyps? yes (04/22/2017) 3. Do you have a family history of Colon Cancer or Polyps? no 4. Diabetes Mellitus? no 5. Joint replacements in the past 12 months?no 6. Major health problems in the past 3 months?no 7. Any artificial heart valves, MVP, or defibrillator?no    MEDICATIONS & ALLERGIES:    Patient reports the following regarding taking any anticoagulation/antiplatelet therapy:   Plavix, Coumadin, Eliquis, Xarelto, Lovenox, Pradaxa, Brilinta, or Effient? yes (Plavix) Aspirin? no  Patient confirms/reports the following medications:  Current Outpatient Medications  Medication Sig Dispense Refill   atorvastatin (LIPITOR) 80 MG tablet Take 1 tablet (80 mg total) by mouth daily. 90 tablet 3   carvedilol (COREG) 3.125 MG tablet Take 1 tablet (3.125 mg total) by mouth 2 (two) times daily with a meal. 180 tablet 3   clopidogrel (PLAVIX) 75 MG tablet Take 1 tablet (75 mg total) by mouth daily. 90 tablet 3   hydrochlorothiazide (HYDRODIURIL) 25 MG tablet Take 1 tablet (25 mg total) by mouth daily. 90 tablet 3   losartan (COZAAR) 50 MG tablet Take 1 tablet (50 mg total) by mouth daily. 90 tablet 3   nitroGLYCERIN (NITROSTAT) 0.4 MG SL tablet Place 1 tablet (0.4 mg total) under the tongue every 5 (five) minutes as needed for chest pain. Maximum of 3 doses. 90 tablet 2   Skin Protectants, Misc. (EUCERIN) cream Apply 1 application topically 3 (three) times daily as needed for dry skin.     tamsulosin (FLOMAX) 0.4 MG CAPS capsule TAKE 1 CAPSULE EVERY DAY 90 capsule 0   No current facility-administered medications for this visit.    Patient confirms/reports the following allergies:  No Known Allergies  No orders of the  defined types were placed in this encounter.   AUTHORIZATION INFORMATION Primary Insurance: 1D#: Group #:  Secondary Insurance: 1D#: Group #:  SCHEDULE INFORMATION: Date: 07/13/2022 Time: Location: [facility]

## 2022-06-08 NOTE — Telephone Encounter (Signed)
Pt received his 5 year recall letter to schedule his colonoscopy with Dr. Tobi Bastos. Please call his cell. He is available all day today.

## 2022-06-08 NOTE — Telephone Encounter (Signed)
   Pre-operative Risk Assessment    Patient Name: Edward [name].  DOB: 11-Jul-1950 MRN: [id number]      Request for Surgical Clearance    Procedure:  Colonoscopy  Date of Surgery:  Clearance 07/13/22                                 Surgeon:  not listed Surgeon's Group or Practice Name:  [facility] Phone number:  [phone] Fax number:  [phone]   Type of Clearance Requested:  Pharmacy, Plavix    Type of Anesthesia:  General    Additional requests/questions:    Signed, Lauralee Evener V   06/08/2022, 4:00 PM

## 2022-06-08 NOTE — Telephone Encounter (Signed)
Order placed

## 2022-06-08 NOTE — Telephone Encounter (Deleted)
Tried to call patient but the call was hung up on me.

## 2022-06-08 NOTE — Telephone Encounter (Signed)
Please advise 

## 2022-06-09 ENCOUNTER — Telehealth: Payer: Self-pay | Admitting: *Deleted

## 2022-06-09 NOTE — Telephone Encounter (Signed)
Pt agreeable to phone clearance.  Patient Consent for Virtual Visit         Edward Damm. has provided verbal consent on 06/09/2022 for a virtual visit (video or telephone).   CONSENT FOR VIRTUAL VISIT FOR:  Edward [name].  By participating in this virtual visit I agree to the following:  I hereby voluntarily request, consent and authorize [facility] and its employed or contracted physicians, physician assistants, nurse practitioners or other licensed health care professionals (the Practitioner), to provide me with telemedicine health care services (the "Services") as deemed necessary by the treating Practitioner. I acknowledge and consent to receive the Services by the Practitioner via telemedicine. I understand that the telemedicine visit will involve communicating with the Practitioner through live audiovisual communication technology and the disclosure of certain medical information by electronic transmission. I acknowledge that I have been given the opportunity to request an in-person assessment or other available alternative prior to the telemedicine visit and am voluntarily participating in the telemedicine visit.  I understand that I have the right to withhold or withdraw my consent to the use of telemedicine in the course of my care at any time, without affecting my right to future care or treatment, and that the Practitioner or I may terminate the telemedicine visit at any time. I understand that I have the right to inspect all information obtained and/or recorded in the course of the telemedicine visit and may receive copies of available information for a reasonable fee.  I understand that some of the potential risks of receiving the Services via telemedicine include:  Delay or interruption in medical evaluation due to technological equipment failure or disruption; Information transmitted may not be sufficient (e.g. poor resolution of images) to allow for appropriate medical  decision making by the Practitioner; and/or  In rare instances, security protocols could fail, causing a breach of personal health information.  Furthermore, I acknowledge that it is my responsibility to provide information about my medical history, conditions and care that is complete and accurate to the best of my ability. I acknowledge that Practitioner's advice, recommendations, and/or decision may be based on factors not within their control, such as incomplete or inaccurate data provided by me or distortions of diagnostic images or specimens that may result from electronic transmissions. I understand that the practice of medicine is not an exact science and that Practitioner makes no warranties or guarantees regarding treatment outcomes. I acknowledge that a copy of this consent can be made available to me via my patient portal [facility] MyChart), or I can request a printed copy by calling the office of [facility].    I understand that my insurance will be billed for this visit.   I have read or had this consent read to me. I understand the contents of this consent, which adequately explains the benefits and risks of the Services being provided via telemedicine.  I have been provided ample opportunity to ask questions regarding this consent and the Services and have had my questions answered to my satisfaction. I give my informed consent for the services to be provided through the use of telemedicine in my medical care

## 2022-06-15 ENCOUNTER — Ambulatory Visit: Payer: Medicare Other | Attending: Cardiology

## 2022-06-15 DIAGNOSIS — Z01810 Encounter for preprocedural cardiovascular examination: Secondary | ICD-10-CM | POA: Diagnosis not present
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
## 2022-06-21 ENCOUNTER — Telehealth: Payer: Self-pay | Admitting: *Deleted

## 2022-06-21 NOTE — Telephone Encounter (Signed)
Will call to remind patient on Wednesday 07/07/2022.
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
## 2022-07-08 NOTE — Telephone Encounter (Signed)
Able to get a hold of patient and reminded patient as follows:  Clopidogrel (Plavix) can be held for 5 days prior to his surgery and resumed as soon as possible post op. Please take ASA 81mg  in its place.   Patient verbalized understanding.

## 2022-07-12 ENCOUNTER — Encounter: Payer: Self-pay | Admitting: Gastroenterology

## 2022-07-13 ENCOUNTER — Ambulatory Visit: Payer: Medicare Other | Admitting: Registered Nurse

## 2022-07-13 ENCOUNTER — Ambulatory Visit
Admission: RE | Admit: 2022-07-13 | Discharge: 2022-07-13 | Disposition: A | Discharge status: Home / Self Care | Payer: Medicare Other | Attending: Gastroenterology | Admitting: Gastroenterology

## 2022-07-13 ENCOUNTER — Encounter
Admission: RE | Disposition: A | Discharge status: Home / Self Care | Payer: Self-pay | Source: Home / Self Care | Attending: Gastroenterology

## 2022-07-13 DIAGNOSIS — Z860100 Personal history of colon polyps, unspecified: Secondary | ICD-10-CM

## 2022-07-13 DIAGNOSIS — Z955 Presence of coronary angioplasty implant and graft: Secondary | ICD-10-CM | POA: Insufficient documentation

## 2022-07-13 DIAGNOSIS — I252 Old myocardial infarction: Secondary | ICD-10-CM | POA: Diagnosis not present

## 2022-07-13 DIAGNOSIS — E785 Hyperlipidemia, unspecified: Secondary | ICD-10-CM | POA: Insufficient documentation

## 2022-07-13 DIAGNOSIS — Z09 Encounter for follow-up examination after completed treatment for conditions other than malignant neoplasm: Secondary | ICD-10-CM | POA: Insufficient documentation

## 2022-07-13 DIAGNOSIS — Z5982 Transportation insecurity: Secondary | ICD-10-CM | POA: Diagnosis not present

## 2022-07-13 DIAGNOSIS — Z1211 Encounter for screening for malignant neoplasm of colon: Secondary | ICD-10-CM | POA: Insufficient documentation

## 2022-07-13 DIAGNOSIS — Z87891 Personal history of nicotine dependence: Secondary | ICD-10-CM | POA: Insufficient documentation

## 2022-07-13 DIAGNOSIS — N400 Enlarged prostate without lower urinary tract symptoms: Secondary | ICD-10-CM | POA: Diagnosis not present

## 2022-07-13 DIAGNOSIS — I2510 Atherosclerotic heart disease of native coronary artery without angina pectoris: Secondary | ICD-10-CM | POA: Insufficient documentation

## 2022-07-13 DIAGNOSIS — D123 Benign neoplasm of transverse colon: Secondary | ICD-10-CM | POA: Insufficient documentation

## 2022-07-13 DIAGNOSIS — I255 Ischemic cardiomyopathy: Secondary | ICD-10-CM | POA: Diagnosis not present

## 2022-07-13 DIAGNOSIS — K635 Polyp of colon: Secondary | ICD-10-CM | POA: Diagnosis not present

## 2022-07-13 DIAGNOSIS — D126 Benign neoplasm of colon, unspecified: Secondary | ICD-10-CM | POA: Diagnosis not present

## 2022-07-13 DIAGNOSIS — I10 Essential (primary) hypertension: Secondary | ICD-10-CM | POA: Diagnosis not present

## 2022-07-13 HISTORY — PX: COLONOSCOPY WITH PROPOFOL: SHX5780

## 2022-07-13 SURGERY — COLONOSCOPY WITH PROPOFOL
Anesthesia: General

## 2022-07-13 MED ORDER — LIDOCAINE HCL (CARDIAC) PF 100 MG/5ML IV SOSY
PREFILLED_SYRINGE | INTRAVENOUS | Status: DC | PRN
  Administered 2022-07-13: 40 mg via INTRAVENOUS

## 2022-07-13 MED ORDER — SODIUM CHLORIDE 0.9 % IV SOLN: INTRAVENOUS | Status: DC

## 2022-07-13 MED ORDER — EPHEDRINE SULFATE (PRESSORS) 50 MG/ML IJ SOLN
INTRAMUSCULAR | Status: DC | PRN
  Administered 2022-07-13: 10 mg via INTRAVENOUS

## 2022-07-13 MED ORDER — EPHEDRINE 5 MG/ML INJ
INTRAVENOUS | Status: AC
  Filled 2022-07-13: qty 5

## 2022-07-13 MED ORDER — PROPOFOL 10 MG/ML IV BOLUS
INTRAVENOUS | Status: DC | PRN
  Administered 2022-07-13: 60 mg via INTRAVENOUS
  Administered 2022-07-13: 20 mg via INTRAVENOUS

## 2022-07-13 MED ORDER — PROPOFOL 500 MG/50ML IV EMUL
INTRAVENOUS | Status: DC | PRN
  Administered 2022-07-13: 150 ug/kg/min via INTRAVENOUS

## 2022-07-13 NOTE — H&P (Signed)
[name], MD [address], [address], [place], [place], [address] [address], [place], [place], [address] Phone: [phone]  Fax: [phone]  Primary Care Physician:  Alfredia Ferguson, PA-C   Pre-Procedure History & Physical: HPI:  Edward [name]. is a 72 y.o. male is here for an colonoscopy.   Past Medical History:  Diagnosis Date   Anxiety    Apical mural thrombus    a. s/p 3 months of anticoagulation   BPH (benign prostatic hyperplasia)    Coronary artery disease    a. 03/2016: LM nl, LAD thrombotic occlusion (3.0x38 Resolute Integrity DES), D1 70 (jailed), mild to mod nonobs LCx & RCA dzs; b. MV 7/18: Mod mid/apical antsept defect, no isch, EF 45%->Med Rx; c. 09/2017 Cath: LM nl, LAD patent stent, D1 70ost (jailed), LCX 50p, OM1 100, OM2 30, RCA 30p, RPAV 50, EF 50-55%->Med Rx.   HFrEF (heart failure with reduced ejection fraction) (HCC)    a. TTE 2/18:  EF of 30-35% (in setting of MI); b. TTE 5/18: EF 35-40%; c. 10/2017 Echo: EF 40-45%, mid-apicalantsept, ant, ap sev HK, Gr1 DD. Mild BAE.   Hyperlipidemia    Hypertension    Ischemic cardiomyopathy    a. TTE 2/18:  EF of 30-35% with LAD territory AK, likely mural thrombus at the LV apex; b. TTE 5/18: EF 35-40%, mid and apical anterior/anteroseptal AK, no evidence of LV thrombus, Gr1DD, mild BAE, normal RVSF; c. 10/2017 Echo: EF 40-45%, mid-apicalantsept, ant, ap sev HK, Gr1 DD. Mild BAE.   Myocardial infarction [facility])     Past Surgical History:  Procedure Laterality Date   CARDIAC CATHETERIZATION     COLONOSCOPY WITH PROPOFOL N/A 04/22/2017   Procedure: COLONOSCOPY WITH PROPOFOL;  Surgeon: [name], MD;  Location: [facility] ENDOSCOPY;  Service: Gastroenterology;  Laterality: N/A;   CORONARY ANGIOGRAPHY N/A 03/09/2016   Procedure: Coronary Angiography;  Surgeon: Yvonne Kendall, MD;  Location: [facility] INVASIVE CV LAB;  Service: Cardiovascular;  Laterality: N/A;   CORONARY STENT INTERVENTION N/A 03/09/2016   Procedure:  Coronary Stent Intervention;  Surgeon: Yvonne Kendall, MD;  Location: [facility] INVASIVE CV LAB;  Service: Cardiovascular;  Laterality: N/A;   HERNIA REPAIR  2005   HYDROCELE EXCISION     LEFT HEART CATH AND CORONARY ANGIOGRAPHY Left 09/20/2017   Procedure: LEFT HEART CATH AND CORONARY ANGIOGRAPHY;  Surgeon: Yvonne Kendall, MD;  Location: [facility] INVASIVE CV LAB;  Service: Cardiovascular;  Laterality: Left;   SPLENECTOMY      Prior to Admission medications   Medication Sig Start Date End Date Taking? Authorizing Provider  carvedilol (COREG) 3.125 MG tablet Take 1 tablet (3.125 mg total) by mouth 2 (two) times daily with a meal. 06/07/22  Yes End, Cristal Deer, MD  hydrochlorothiazide (HYDRODIURIL) 25 MG tablet Take 1 tablet (25 mg total) by mouth daily. 06/07/22  Yes End, Cristal Deer, MD  losartan (COZAAR) 50 MG tablet Take 1 tablet (50 mg total) by mouth daily. 06/07/22  Yes End, Cristal Deer, MD  tamsulosin (FLOMAX) 0.4 MG CAPS capsule TAKE 1 CAPSULE EVERY DAY 10/16/21  Yes Drubel, Lillia Abed, PA-C  atorvastatin (LIPITOR) 80 MG tablet Take 1 tablet (80 mg total) by mouth daily. 06/07/22   End, Cristal Deer, MD  clopidogrel (PLAVIX) 75 MG tablet Take 1 tablet (75 mg total) by mouth daily. 06/07/22   End, Cristal Deer, MD  nitroGLYCERIN (NITROSTAT) 0.4 MG SL tablet Place 1 tablet (0.4 mg total) under the tongue every 5 (five) minutes as needed for chest pain. Maximum of  3 doses. 02/12/19 07/04/28  End, Cristal Deer, MD  Skin Protectants, Misc. (EUCERIN) cream Apply 1 application topically 3 (three) times daily as needed for dry skin.    [provider]    Allergies as of 06/08/2022   (No Known Allergies)    Family History  Problem Relation Age of Onset   Emphysema Mother    Macular degeneration Mother    Heart attack Mother    Heart attack Father     Social History   Socioeconomic History   Marital status: Married    Spouse name: Not on file   Number of children: 0   Years of education: Not on  file   Highest education level: Bachelor's degree (e.g., BA, AB, BS)  Occupational History   Not on file  Tobacco Use   Smoking status: Former    Packs/day: 3.00    Years: 30.00    Additional pack years: 0.00    Total pack years: 90.00    Types: Cigarettes    Start date: 07/03/2007    Passive exposure: Past   Smokeless tobacco: Never   Tobacco comments:    QUIT IN 2005  Vaping Use   Vaping Use: Never used  Substance and Sexual Activity   Alcohol use: Yes    Alcohol/week: 14.0 standard drinks of alcohol    Types: 14 Cans of beer per week    Comment: average of 2 beers per day   Drug use: Yes    Types: Marijuana    Comment: smokes pot occassionally   Sexual activity: Not on file  Other Topics Concern   Not on file  Social History Narrative   Living with family, Independent at baseline   Social Determinants of Health   Financial Resource Strain: Low Risk  (07/15/2021)   Overall Financial Resource Strain (CARDIA)    Difficulty of Paying Living Expenses: Not hard at all  Food Insecurity: No Food Insecurity (07/15/2021)   Hunger Vital Sign    Worried About Running Out of Food in the Last Year: Never true    Ran Out of Food in the Last Year: Never true  Transportation Needs: Unmet Transportation Needs (07/15/2021)   PRAPARE - Transportation    Lack of Transportation (Medical): Yes    Lack of Transportation (Non-Medical): Yes  Physical Activity: Sufficiently Active (07/15/2021)   Exercise Vital Sign    Days of Exercise per Week: 7 days    Minutes of Exercise per Session: 60 min  Stress: Stress Concern Present (07/15/2021)   [employer] of Occupational Health - Occupational Stress Questionnaire    Feeling of Stress : To some extent  Social Connections: Moderately Integrated (07/15/2021)   Social Connection and Isolation Panel [NHANES]    Frequency of Communication with Friends and Family: More than three times a week    Frequency of Social Gatherings with Friends and  Family: Twice a week    Attends Religious Services: Never    Database administrator or Organizations: Yes    Attends Engineer, structural: More than 4 times per year    Marital Status: Married  Catering manager Violence: Not At Risk (07/15/2021)   Humiliation, Afraid, Rape, and Kick questionnaire    Fear of Current or Ex-Partner: No    Emotionally Abused: No    Physically Abused: No    Sexually Abused: No    Review of Systems: See HPI, otherwise negative ROS  Physical Exam: Ht 5\' 10"  (1.778 m)   Wt 97.5  kg   BMI 30.85 kg/m  General:   Alert,  pleasant and cooperative in NAD Head:  Normocephalic and atraumatic. Neck:  Supple; no masses or thyromegaly. Lungs:  Clear throughout to auscultation, normal respiratory effort.    Heart:  +S1, +S2, Regular rate and rhythm, No edema. Abdomen:  Soft, nontender and nondistended. Normal bowel sounds, without guarding, and without rebound.   Neurologic:  Alert and  oriented x4;  grossly normal neurologically.  Impression/Plan: Kym Groom. is here for an colonoscopy to be performed for surveillance due to prior history of colon polyps   Risks, benefits, limitations, and alternatives regarding  colonoscopy have been reviewed with the patient.  Questions have been answered.  All parties agreeable.   [name], MD  07/13/2022, 9:15 AM

## 2022-07-13 NOTE — Transfer of Care (Signed)
Immediate Anesthesia Transfer of Care Note  Patient: [name].  Procedure(s) Performed: COLONOSCOPY WITH PROPOFOL  Patient Location: PACU  Anesthesia Type:General  Level of Consciousness: drowsy  Airway & Oxygen Therapy: Patient Spontanous Breathing  Post-op Assessment: Report given to RN and Post -op Vital signs reviewed and stable  Post vital signs: stable  Last Vitals:  Vitals Value Taken Time  BP 136/56 07/13/22 1017  Temp 35.6 C 07/13/22 1017  Pulse 58 07/13/22 1019  Resp 12 07/13/22 1019  SpO2 96 % 07/13/22 1019  Vitals shown include unvalidated device data.  Last Pain:  Vitals:   07/13/22 1017  TempSrc: Temporal  PainSc: Asleep         Complications: No notable events documented.

## 2022-07-13 NOTE — Anesthesia Postprocedure Evaluation (Signed)
Anesthesia Post Note  Patient: Markes Shatswell.  Procedure(s) Performed: COLONOSCOPY WITH PROPOFOL  Patient location during evaluation: Endoscopy Anesthesia Type: General Level of consciousness: awake and alert Pain management: pain level controlled Vital Signs Assessment: post-procedure vital signs reviewed and stable Respiratory status: spontaneous breathing, nonlabored ventilation, respiratory function stable and patient connected to nasal cannula oxygen Cardiovascular status: blood pressure returned to baseline and stable Postop Assessment: no apparent nausea or vomiting Anesthetic complications: no  No notable events documented.   Last Vitals:  Vitals:   07/13/22 1017 07/13/22 1027  BP: (!) 136/56 120/64  Pulse: 61   Resp:    Temp: (!) 35.6 C   SpO2: 97%     Last Pain:  Vitals:   07/13/22 1037  TempSrc:   PainSc: 0-No pain                 Stephanie Coup

## 2022-07-13 NOTE — Anesthesia Preprocedure Evaluation (Signed)
Anesthesia Evaluation  Patient identified by MRN, date of birth, ID band Patient awake    Reviewed: Allergy & Precautions, H&P , NPO status , Patient's Chart, lab work & pertinent test results, reviewed documented beta blocker date and time   History of Anesthesia Complications Negative for: history of anesthetic complications  Airway Mallampati: II  TM Distance: >3 FB Neck ROM: full    Dental  (+) Caps, Dental Advidsory Given, Missing, Teeth Intact   Pulmonary neg shortness of breath, neg sleep apnea, neg COPD, Recent URI , Residual Cough, former smoker          Cardiovascular Exercise Tolerance: Good hypertension, (-) angina + CAD, + Past MI and + Cardiac Stents  (-) CABG (-) dysrhythmias (-) Valvular Problems/Murmurs     Neuro/Psych negative neurological ROS  negative psych ROS   GI/Hepatic Neg liver ROS,GERD  ,,  Endo/Other  negative endocrine ROS    Renal/GU negative Renal ROS  negative genitourinary   Musculoskeletal   Abdominal   Peds  Hematology negative hematology ROS (+)   Anesthesia Other Findings Past Medical History: No date: Anxiety No date: Apical mural thrombus     Comment:  a. s/p 3 months of anticoagulation No date: BPH (benign prostatic hyperplasia) No date: Coronary artery disease     Comment:  a. LHC 2/18: thrombotic occlusion of p-mLAD involving               D1/D2, mild to mod nonobs CAD of LCx & RCA, successful               IVUS-guided PCI to p-mLAD (Integrity Resolute 3.0 x 38 mm              DES) w/ 0% residual stenosis & TIMI-3 flow, D2 jailed by               LAD stent resulting in 70% ostial stenosis w/ TIMI-3               flow. b. MV 7/18: no sig isch, mod in size fixed mid &               apical anterosep * apical defect, EF 45%, low to mod risk No date: Hyperlipidemia No date: Hypertension No date: Ischemic cardiomyopathy     Comment:  a. TTE 2/18:  EF of 30-35% with LAD  territory AK, likely              mural thrombus at the LV apex; b. TTE 5/18: EF 35-40%,               mid and apical anterior/anteroseptal AK, no evidence of               LV thrombus, Gr1DD, mild biatrial enlargement, normal               RVSF No date: Myocardial infarction (HCC)   Reproductive/Obstetrics negative OB ROS                             Anesthesia Physical Anesthesia Plan  ASA: II  Anesthesia Plan: General   Post-op Pain Management:    Induction: Intravenous  PONV Risk Score and Plan: 2 and Propofol infusion and Ondansetron  Airway Management Planned: Natural Airway and Nasal Cannula  Additional Equipment:   Intra-op Plan:   Post-operative Plan:   Informed Consent: I have reviewed the patients History and Physical, chart, labs and  discussed the procedure including the risks, benefits and alternatives for the proposed anesthesia with the patient or authorized representative who has indicated his/her understanding and acceptance.     Dental Advisory Given  Plan Discussed with: Anesthesiologist, CRNA and Surgeon  Anesthesia Plan Comments: (Patient consented for risks of anesthesia including but not limited to:  - adverse reactions to medications - risk of airway placement if required - damage to eyes, teeth, lips or other oral mucosa - nerve damage due to positioning  - sore throat or hoarseness - Damage to heart, brain, nerves, lungs, other parts of body or loss of life  Patient voiced understanding.)        Anesthesia Quick Evaluation

## 2022-07-13 NOTE — Op Note (Signed)
[facility] Gastroenterology Patient Name: Edward [name] Procedure Date: 07/13/2022 9:39 AM MRN: [id number] Account #: [id number] Date of Birth: [birth year] Admit Type: Outpatient Age: 72 Room: [facility] ENDO ROOM 2 Gender: Male Note Status: Finalized Instrument Name: Prentice Docker [id number] Procedure:             Colonoscopy Indications:           Surveillance: Personal history of adenomatous polyps                         on last colonoscopy 5 years ago Providers:             [name] MD, MD Referring MD:          Alfredia Ferguson (Referring MD) Medicines:             Monitored Anesthesia Care Complications:         No immediate complications. Procedure:             Pre-Anesthesia Assessment:                        - Prior to the procedure, a History and Physical was                         performed, and patient medications, allergies and                         sensitivities were reviewed. The patient's tolerance                         of previous anesthesia was reviewed.                        - The risks and benefits of the procedure and the                         sedation options and risks were discussed with the                         patient. All questions were answered and informed                         consent was obtained.                        - ASA Grade Assessment: II - A patient with mild                         systemic disease.                        After obtaining informed consent, the colonoscope was                         passed under direct vision. Throughout the procedure,                         the patient's blood pressure, pulse, and oxygen                         saturations were  monitored continuously. The                         Colonoscope was introduced through the anus and                         advanced to the the cecum, identified by the                         appendiceal orifice. The colonoscopy was performed                          with ease. The patient tolerated the procedure well.                         The quality of the bowel preparation was excellent.                         The ileocecal valve, appendiceal orifice, and rectum                         were photographed. Findings:      The perianal and digital rectal examinations were normal.      A 5 mm polyp was found in the transverse colon. The polyp was sessile.       The polyp was removed with a cold snare. Resection and retrieval were       complete.      The exam was otherwise without abnormality on direct and retroflexion       views. Impression:            - One 5 mm polyp in the transverse colon, removed with                         a cold snare. Resected and retrieved.                        - The examination was otherwise normal on direct and                         retroflexion views. Recommendation:        - Discharge patient to home (with escort).                        - Resume previous diet.                        - Continue present medications.                        - Await pathology results.                        - Repeat colonoscopy in 5 years for surveillance based                         on pathology results. Procedure Code(s):     --- Professional ---                        [phone], Colonoscopy, flexible; with  removal of                         tumor(s), polyp(s), or other lesion(s) by snare                         technique Diagnosis Code(s):     --- Professional ---                        Z86.010, Personal history of colonic polyps                        D12.3, Benign neoplasm of transverse colon (hepatic                         flexure or splenic flexure) CPT copyright 2022 American Medical Association. All rights reserved. The codes documented in this report are preliminary and upon coder review may  be revised to meet current compliance requirements. [name], MD [name] MD, MD 07/13/2022 10:13:03 AM This report has  been signed electronically. Number of Addenda: 0 Note Initiated On: 07/13/2022 9:39 AM Scope Withdrawal Time: 0 hours 10 minutes 57 seconds  Total Procedure Duration: 0 hours 13 minutes 44 seconds  Estimated Blood Loss:  Estimated blood loss: none.      [facility]

## 2022-07-14 ENCOUNTER — Encounter: Payer: Self-pay | Admitting: Gastroenterology

## 2022-07-27 DIAGNOSIS — D2262 Melanocytic nevi of left upper limb, including shoulder: Secondary | ICD-10-CM | POA: Diagnosis not present

## 2022-07-27 DIAGNOSIS — D2272 Melanocytic nevi of left lower limb, including hip: Secondary | ICD-10-CM | POA: Diagnosis not present

## 2022-07-27 DIAGNOSIS — D2271 Melanocytic nevi of right lower limb, including hip: Secondary | ICD-10-CM | POA: Diagnosis not present

## 2022-07-27 DIAGNOSIS — D225 Melanocytic nevi of trunk: Secondary | ICD-10-CM | POA: Diagnosis not present

## 2022-07-27 DIAGNOSIS — L821 Other seborrheic keratosis: Secondary | ICD-10-CM | POA: Diagnosis not present

## 2022-07-27 DIAGNOSIS — L0212 Furuncle of neck: Secondary | ICD-10-CM | POA: Diagnosis not present

## 2022-07-27 DIAGNOSIS — L02821 Furuncle of head [any part, except face]: Secondary | ICD-10-CM | POA: Diagnosis not present

## 2022-07-27 DIAGNOSIS — D2261 Melanocytic nevi of right upper limb, including shoulder: Secondary | ICD-10-CM | POA: Diagnosis not present

## 2022-08-12 ENCOUNTER — Ambulatory Visit (INDEPENDENT_AMBULATORY_CARE_PROVIDER_SITE_OTHER): Payer: Medicare Other | Admitting: Physician Assistant

## 2022-08-12 ENCOUNTER — Encounter: Payer: Self-pay | Admitting: Physician Assistant

## 2022-08-12 VITALS — BP 92/66 | HR 61 | Ht 70.0 in | Wt 205.0 lb

## 2022-08-12 DIAGNOSIS — I10 Essential (primary) hypertension: Secondary | ICD-10-CM

## 2022-08-12 DIAGNOSIS — I5022 Chronic systolic (congestive) heart failure: Secondary | ICD-10-CM | POA: Diagnosis not present

## 2022-08-12 DIAGNOSIS — I2510 Atherosclerotic heart disease of native coronary artery without angina pectoris: Secondary | ICD-10-CM

## 2022-08-12 DIAGNOSIS — Z87891 Personal history of nicotine dependence: Secondary | ICD-10-CM

## 2022-08-12 DIAGNOSIS — N401 Enlarged prostate with lower urinary tract symptoms: Secondary | ICD-10-CM | POA: Diagnosis not present

## 2022-08-12 DIAGNOSIS — R7303 Prediabetes: Secondary | ICD-10-CM

## 2022-08-12 NOTE — Progress Notes (Signed)
Complete physical exam   Patient: Edward [name].   DOB: 1950-12-28   72 y.o. Male  MRN: [id number] Visit Date: 08/12/2022  Today's healthcare provider: Alfredia Ferguson, PA-C   Chief Complaint  Patient presents with   Annual Exam    Patient reports consuming a low to no carb diet for about 3 months now.He reports walking over 12,000 steps a day. He reports feeling well and sleeping fairly well with concerns about his umbilical hernia.    Subjective    Edward [name]. is a 71 y.o. male who presents today for a complete physical exam.   Discussed the use of AI scribe software for clinical note transcription with the patient, who gave verbal consent to proceed.  History of Present Illness   The patient presents for a routine check-up and blood work. He reports significant weight loss of approximately 26 pounds since his last visit, which he attributes to dietary changes and increased physical activity. He has been experiencing lower than usual blood pressure readings, He denies any associated symptoms such as dizziness or fainting.  The patient also expresses concern about a possible recurrence of hernias. He has a history of an inguinal hernia and an umbilical hernia. He reports a bulge in the lower abdominal region, he also has a history of hydrocele. He has an upcoming appt with urology. He denies any associated pain or discomfort.  The patient is also due for a lung cancer screening. He had a screening several years ago which revealed some small nodules. He expresses a desire to have another screening to monitor these nodules. No longer a smoker, but extensive history.      HPI  Past Medical History:  Diagnosis Date   Anxiety    Apical mural thrombus    a. s/p 3 months of anticoagulation   BPH (benign prostatic hyperplasia)    CHF (congestive heart failure) (HCC) Four years ago   Coronary artery disease    a. 03/2016: LM nl, LAD thrombotic occlusion (3.0x38 Resolute  Integrity DES), D1 70 (jailed), mild to mod nonobs LCx & RCA dzs; b. MV 7/18: Mod mid/apical antsept defect, no isch, EF 45%->Med Rx; c. 09/2017 Cath: LM nl, LAD patent stent, D1 70ost (jailed), LCX 50p, OM1 100, OM2 30, RCA 30p, RPAV 50, EF 50-55%->Med Rx.   GERD (gastroesophageal reflux disease) Years ago   HFrEF (heart failure with reduced ejection fraction) (HCC)    a. TTE 2/18:  EF of 30-35% (in setting of MI); b. TTE 5/18: EF 35-40%; c. 10/2017 Echo: EF 40-45%, mid-apicalantsept, ant, ap sev HK, Gr1 DD. Mild BAE.   Hyperlipidemia    Hypertension    Ischemic cardiomyopathy    a. TTE 2/18:  EF of 30-35% with LAD territory AK, likely mural thrombus at the LV apex; b. TTE 5/18: EF 35-40%, mid and apical anterior/anteroseptal AK, no evidence of LV thrombus, Gr1DD, mild BAE, normal RVSF; c. 10/2017 Echo: EF 40-45%, mid-apicalantsept, ant, ap sev HK, Gr1 DD. Mild BAE.   Myocardial infarction [facility])    Past Surgical History:  Procedure Laterality Date   CARDIAC CATHETERIZATION     COLONOSCOPY WITH PROPOFOL N/A 04/22/2017   Procedure: COLONOSCOPY WITH PROPOFOL;  Surgeon: [name], MD;  Location: [facility] ENDOSCOPY;  Service: Gastroenterology;  Laterality: N/A;   COLONOSCOPY WITH PROPOFOL N/A 07/13/2022   Procedure: COLONOSCOPY WITH PROPOFOL;  Surgeon: [name], MD;  Location: [facility] ENDOSCOPY;  Service: Gastroenterology;  Laterality: N/A;   CORONARY  ANGIOGRAPHY N/A 03/09/2016   Procedure: Coronary Angiography;  Surgeon: Yvonne Kendall, MD;  Location: [facility] INVASIVE CV LAB;  Service: Cardiovascular;  Laterality: N/A;   CORONARY STENT INTERVENTION N/A 03/09/2016   Procedure: Coronary Stent Intervention;  Surgeon: Yvonne Kendall, MD;  Location: [facility] INVASIVE CV LAB;  Service: Cardiovascular;  Laterality: N/A;   HERNIA REPAIR  2005   HYDROCELE EXCISION     LEFT HEART CATH AND CORONARY ANGIOGRAPHY Left 09/20/2017   Procedure: LEFT HEART CATH AND CORONARY ANGIOGRAPHY;  Surgeon: Yvonne Kendall, MD;  Location:  [facility] INVASIVE CV LAB;  Service: Cardiovascular;  Laterality: Left;   SPLENECTOMY     Social History   Socioeconomic History   Marital status: Married    Spouse name: Not on file   Number of children: 0   Years of education: Not on file   Highest education level: Bachelor's degree (e.g., BA, AB, BS)  Occupational History   Not on file  Tobacco Use   Smoking status: Former    Current packs/day: 0.00    Average packs/day: 3.0 packs/day for 17.3 years (52.0 ttl pk-yrs)    Types: Cigarettes    Start date: 07/03/2007    Quit date: 12/12/2009    Years since quitting: 12.6    Passive exposure: Past   Smokeless tobacco: Never   Tobacco comments:    QUIT IN 2005  Vaping Use   Vaping status: Never Used  Substance and Sexual Activity   Alcohol use: Yes    Alcohol/week: 9.0 standard drinks of alcohol    Types: 1 Glasses of wine, 8 Cans of beer per week    Comment: average of 2 beers per day   Drug use: Yes    Frequency: 1.0 times per week    Types: Marijuana    Comment: smokes pot occassionally   Sexual activity: Yes    Birth control/protection: None  Other Topics Concern   Not on file  Social History Narrative   Living with family, Independent at baseline   Social Determinants of Health   Financial Resource Strain: Low Risk  (07/15/2021)   Overall Financial Resource Strain (CARDIA)    Difficulty of Paying Living Expenses: Not hard at all  Food Insecurity: No Food Insecurity (07/15/2021)   Hunger Vital Sign    Worried About Running Out of Food in the Last Year: Never true    Ran Out of Food in the Last Year: Never true  Transportation Needs: Unmet Transportation Needs (07/15/2021)   PRAPARE - Transportation    Lack of Transportation (Medical): Yes    Lack of Transportation (Non-Medical): Yes  Physical Activity: Sufficiently Active (07/15/2021)   Exercise Vital Sign    Days of Exercise per Week: 7 days    Minutes of Exercise per Session: 60 min  Stress: Stress Concern Present  (07/15/2021)   [employer] of Occupational Health - Occupational Stress Questionnaire    Feeling of Stress : To some extent  Social Connections: Moderately Integrated (07/15/2021)   Social Connection and Isolation Panel [NHANES]    Frequency of Communication with Friends and Family: More than three times a week    Frequency of Social Gatherings with Friends and Family: Twice a week    Attends Religious Services: Never    Database administrator or Organizations: Yes    Attends Engineer, structural: More than 4 times per year    Marital Status: Married  Catering manager Violence: Not At Risk (07/15/2021)   Humiliation, Afraid, Rape, and  Kick questionnaire    Fear of Current or Ex-Partner: No    Emotionally Abused: No    Physically Abused: No    Sexually Abused: No   Family Status  Relation Name Status   Mother Junius Roads Deceased   Father Nirvan Laban Sr Deceased at age 18   MGM  Deceased   MGF  Deceased   PGM  Deceased   PGF  Deceased  No partnership data on file   Family History  Problem Relation Age of Onset   Emphysema Mother    Macular degeneration Mother    Heart attack Mother    COPD Mother    Heart attack Father    Heart disease Father    No Known Allergies  Patient Care Team: Alfredia Ferguson, PA-C as PCP - General (Physician Assistant) End, Cristal Deer, MD as PCP - Cardiology (Cardiology) Alita Chyle, MD (Inactive) [facility]   Medications: Outpatient Medications Prior to Visit  Medication Sig   atorvastatin (LIPITOR) 80 MG tablet Take 1 tablet (80 mg total) by mouth daily.   carvedilol (COREG) 3.125 MG tablet Take 1 tablet (3.125 mg total) by mouth 2 (two) times daily with a meal.   clopidogrel (PLAVIX) 75 MG tablet Take 1 tablet (75 mg total) by mouth daily.   hydrochlorothiazide (HYDRODIURIL) 25 MG tablet Take 1 tablet (25 mg total) by mouth daily.   losartan (COZAAR) 50 MG tablet Take 1 tablet (50 mg total) by  mouth daily.   nitroGLYCERIN (NITROSTAT) 0.4 MG SL tablet Place 1 tablet (0.4 mg total) under the tongue every 5 (five) minutes as needed for chest pain. Maximum of 3 doses.   Skin Protectants, Misc. (EUCERIN) cream Apply 1 application topically 3 (three) times daily as needed for dry skin.   tamsulosin (FLOMAX) 0.4 MG CAPS capsule TAKE 1 CAPSULE EVERY DAY   No facility-administered medications prior to visit.     Objective    BP 92/66 (BP Location: Left Arm, Patient Position: Sitting, Cuff Size: Normal)   Pulse 61   Ht 5\' 10"  (1.778 m)   Wt 205 lb (93 kg)   SpO2 97%   BMI 29.41 kg/m    Physical Exam Constitutional:      General: He is awake.     Appearance: He is well-developed.  HENT:     Head: Normocephalic.     Nose: Nose normal. No congestion or rhinorrhea.     Mouth/Throat:     Mouth: Mucous membranes are moist.     Pharynx: No oropharyngeal exudate or posterior oropharyngeal erythema.  Eyes:     Pupils: Pupils are equal, round, and reactive to light.  Cardiovascular:     Rate and Rhythm: Normal rate and regular rhythm.     Heart sounds: Normal heart sounds.  Pulmonary:     Effort: Pulmonary effort is normal.     Breath sounds: Normal breath sounds.  Abdominal:     General: There is no distension.     Palpations: Abdomen is soft.     Tenderness: There is no abdominal tenderness. There is no guarding.  Musculoskeletal:     Cervical back: Normal range of motion.     Right lower leg: No edema.     Left lower leg: No edema.  Lymphadenopathy:     Cervical: No cervical adenopathy.  Skin:    General: Skin is warm.  Neurological:     Mental Status: He is alert and oriented to person, place,  and time.  Psychiatric:        Attention and Perception: Attention normal.        Mood and Affect: Mood normal.        Speech: Speech normal.        Behavior: Behavior normal. Behavior is cooperative.     Last depression screening scores    08/12/2022    8:57 AM 07/21/2021     2:18 PM 07/15/2021    8:24 AM  PHQ 2/9 Scores  PHQ - 2 Score 0 0 0  PHQ- 9 Score  0 0   Last fall risk screening    08/12/2022    8:57 AM  Fall Risk   Falls in the past year? 0  Injury with Fall? 0  Risk for fall due to : No Fall Risks  Follow up Falls evaluation completed   Last Audit-C alcohol use screening    07/21/2021    2:18 PM  Alcohol Use Disorder Test (AUDIT)  1. How often do you have a drink containing alcohol? 4  2. How many drinks containing alcohol do you have on a typical day when you are drinking? 1  3. How often do you have six or more drinks on one occasion? 0  AUDIT-C Score 5   A score of 3 or more in women, and 4 or more in men indicates increased risk for alcohol abuse, EXCEPT if all of the points are from question 1   No results found for any visits on 08/12/22.  Assessment & Plan    Routine Health Maintenance and Physical Exam  Exercise Activities and Dietary recommendations --balanced diet high in fiber and protein, low in sugars, carbs, fats. --physical activity/exercise 30 minutes 3-5 times a week     Immunization History  Administered Date(s) Administered   HIB (PRP-T) 04/11/2019   Influenza,inj,quad, With Preservative 02/02/2019   Influenza-Unspecified 02/01/2018, 01/11/2020   Meningococcal Conjugate 04/11/2019   PFIZER(Purple Top)SARS-COV-2 Vaccination 04/05/2019, 06/27/2019, 01/11/2020   Pfizer Covid-19 Vaccine Bivalent Booster 56yrs & up 11/13/2020   Pneumococcal Conjugate-13 03/25/2017   Pneumococcal Polysaccharide-23 07/21/2018, 04/11/2019   Tdap 11/23/2012    Health Maintenance  Topic Date Due   Zoster Vaccines- Shingrix (1 of 2) Never done   Lung Cancer Screening  10/21/2021   COVID-19 Vaccine (6 - 2023-24 season) 02/16/2022   Medicare Annual Wellness (AWV)  07/16/2022   INFLUENZA VACCINE  09/02/2022   DTaP/Tdap/Td (2 - Td or Tdap) 11/24/2022   Colonoscopy  07/13/2027   Pneumonia Vaccine 70+ Years old  Completed    Hepatitis C Screening  Completed   HPV VACCINES  Aged Out   COLON CANCER SCREENING ANNUAL FOBT  Discontinued    Discussed health benefits of physical activity, and encouraged him to engage in regular exercise appropriate for his age and condition.  Problem List Items Addressed This Visit       Cardiovascular and Mediastinum   Essential hypertension - Primary    Borderline hypotension today w/ no symptoms. Likely d/t weight loss, pt has f/u with cardiology next week, recommending discussing. Likely will just cut losartan dose from 50 to 25.      Relevant Orders   Comprehensive metabolic panel   Chronic HFrEF (heart failure with reduced ejection fraction) (HCC)    Euvolemic managed by cardiology      Relevant Orders   Comprehensive metabolic panel   Coronary artery disease involving native coronary artery of native heart without angina pectoris    Managed w/  lipitor Will check fasting lipids       Relevant Orders   Lipid Profile   CBC with Differential/Platelet   Other Visit Diagnoses     Prediabetes       Relevant Orders   HgB A1c   History of tobacco use       Relevant Orders   Ambulatory Referral for Lung Cancer Screening [REF832]   Benign prostatic hyperplasia with lower urinary tract symptoms, symptom details unspecified       Relevant Orders   PSA        Return in about 6 months (around 02/12/2023) for chronic conditions.     I, Alfredia Ferguson, PA-C have reviewed all documentation for this visit. The documentation on  08/12/22   for the exam, diagnosis, procedures, and orders are all accurate and complete.  Alfredia Ferguson, PA-C [facility] [address] [place], [place], [address] Office: [phone] Fax: [phone]   [facility]

## 2022-08-12 NOTE — Assessment & Plan Note (Signed)
Euvolemic managed by cardiology

## 2022-08-12 NOTE — Assessment & Plan Note (Signed)
Managed w/ lipitor Will check fasting lipids  

## 2022-08-12 NOTE — Assessment & Plan Note (Signed)
Borderline hypotension today w/ no symptoms. Likely d/t weight loss, pt has f/u with cardiology next week, recommending discussing. Likely will just cut losartan dose from 50 to 25.

## 2022-08-13 LAB — LIPID PANEL
Chol/HDL Ratio: 3 ratio (ref 0.0–5.0)
Cholesterol, Total: 128 mg/dL (ref 100–199)
HDL: 42 mg/dL (ref >39)
LDL Chol Calc (NIH): 71 mg/dL (ref 0–99)
Triglycerides: 75 mg/dL (ref 0–149)
VLDL Cholesterol Cal: 15 mg/dL (ref 5–40)

## 2022-08-13 LAB — CBC WITH DIFFERENTIAL/PLATELET
Basophils Absolute: 0.1 10*3/uL (ref 0.0–0.2)
Basos: 1 %
EOS (ABSOLUTE): 0.1 10*3/uL (ref 0.0–0.4)
Eos: 2 %
Hematocrit: 44.1 % (ref 37.5–51.0)
Hemoglobin: 14.8 g/dL (ref 13.0–17.7)
Immature Grans (Abs): 0 10*3/uL (ref 0.0–0.1)
Immature Granulocytes: 0 %
Lymphocytes Absolute: 2 10*3/uL (ref 0.7–3.1)
Lymphs: 29 %
MCH: 32.2 pg (ref 26.6–33.0)
MCHC: 33.6 g/dL (ref 31.5–35.7)
MCV: 96 fL (ref 79–97)
Monocytes Absolute: 0.8 10*3/uL (ref 0.1–0.9)
Monocytes: 12 %
Neutrophils Absolute: 3.7 10*3/uL (ref 1.4–7.0)
Neutrophils: 56 %
Platelets: 318 10*3/uL (ref 150–450)
RBC: 4.59 x10E6/uL (ref 4.14–5.80)
RDW: 13.1 % (ref 11.6–15.4)
WBC: 6.7 10*3/uL (ref 3.4–10.8)

## 2022-08-13 LAB — COMPREHENSIVE METABOLIC PANEL
ALT: 20 IU/L (ref 0–44)
AST: 25 IU/L (ref 0–40)
Albumin: 4.3 g/dL (ref 3.9–4.9)
Alkaline Phosphatase: 62 IU/L (ref 44–121)
BUN/Creatinine Ratio: 16 (ref 10–24)
BUN: 16 mg/dL (ref 8–27)
Bilirubin Total: 0.8 mg/dL (ref 0.0–1.2)
CO2: 23 mmol/L (ref 20–29)
Calcium: 9.5 mg/dL (ref 8.6–10.2)
Chloride: 99 mmol/L (ref 96–106)
Creatinine, Ser: 0.98 mg/dL (ref 0.76–1.27)
Globulin, Total: 2.7 g/dL (ref 1.5–4.5)
Glucose: 95 mg/dL (ref 70–99)
Potassium: 3.9 mmol/L (ref 3.5–5.2)
Sodium: 138 mmol/L (ref 134–144)
Total Protein: 7 g/dL (ref 6.0–8.5)
eGFR: 83 mL/min/{1.73_m2} (ref >59)

## 2022-08-13 LAB — HEMOGLOBIN A1C
Est. average glucose Bld gHb Est-mCnc: 120 mg/dL
Hgb A1c MFr Bld: 5.8 % — ABNORMAL HIGH (ref 4.8–5.6)

## 2022-08-13 LAB — PSA: Prostate Specific Ag, Serum: 0.7 ng/mL (ref 0.0–4.0)

## 2022-08-16 ENCOUNTER — Encounter: Payer: Self-pay | Admitting: Medical

## 2022-08-16 ENCOUNTER — Ambulatory Visit: Payer: Medicare Other | Attending: Medical | Admitting: Medical

## 2022-08-16 VITALS — BP 110/72 | HR 59 | Ht 70.0 in | Wt 213.6 lb

## 2022-08-16 DIAGNOSIS — I5022 Chronic systolic (congestive) heart failure: Secondary | ICD-10-CM | POA: Insufficient documentation

## 2022-08-16 DIAGNOSIS — E782 Mixed hyperlipidemia: Secondary | ICD-10-CM | POA: Diagnosis not present

## 2022-08-16 DIAGNOSIS — I10 Essential (primary) hypertension: Secondary | ICD-10-CM | POA: Diagnosis not present

## 2022-08-16 DIAGNOSIS — I2510 Atherosclerotic heart disease of native coronary artery without angina pectoris: Secondary | ICD-10-CM | POA: Diagnosis not present

## 2022-08-16 DIAGNOSIS — I255 Ischemic cardiomyopathy: Secondary | ICD-10-CM | POA: Diagnosis not present

## 2022-08-16 NOTE — Patient Instructions (Signed)
Medication Instructions:  Your physician recommends that you continue on your current medications as directed. Please refer to the Current Medication list given to you today.  *If you need a refill on your cardiac medications before your next appointment, please call your pharmacy*   Lab Work: none If you have labs (blood work) drawn today and your tests are completely normal, you will receive your results only by: MyChart Message (if you have MyChart) OR A paper copy in the mail If you have any lab test that is abnormal or we need to change your treatment, we will call you to review the results.   Testing/Procedures: none   Follow-Up: At [facility], you and your health needs are our priority.  As part of our continuing mission to provide you with exceptional heart care, we have created designated Provider Care Teams.  These Care Teams include your primary Cardiologist (physician) and Advanced Practice Providers (APPs -  Physician Assistants and Nurse Practitioners) who all work together to provide you with the care you need, when you need it.  We recommend signing up for the patient portal called "MyChart".  Sign up information is provided on this After Visit Summary.  MyChart is used to connect with patients for Virtual Visits (Telemedicine).  Patients are able to view lab/test results, encounter notes, upcoming appointments, etc.  Non-urgent messages can be sent to your provider as well.   To learn more about what you can do with MyChart, go to [email or web address].    Your next appointment:   6 month(s)  Provider:   You may see Yvonne Kendall, MD or one of the following Advanced Practice Providers on your designated Care Team:   Cadence [place], [place]
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
## 2022-08-26 ENCOUNTER — Encounter: Payer: Self-pay | Admitting: Urology

## 2022-08-26 ENCOUNTER — Ambulatory Visit (INDEPENDENT_AMBULATORY_CARE_PROVIDER_SITE_OTHER): Payer: Medicare Other | Admitting: Urology

## 2022-08-26 VITALS — BP 127/84 | HR 53 | Ht 70.0 in

## 2022-08-26 DIAGNOSIS — R399 Unspecified symptoms and signs involving the genitourinary system: Secondary | ICD-10-CM | POA: Diagnosis not present

## 2022-08-26 DIAGNOSIS — Z125 Encounter for screening for malignant neoplasm of prostate: Secondary | ICD-10-CM

## 2022-08-26 LAB — BLADDER SCAN AMB NON-IMAGING

## 2022-08-26 NOTE — Progress Notes (Addendum)
   08/26/2022 8:53 AM   Kym Groom. [birth year] [id number]  Reason for visit: Follow up BPH/lower urinary tract symptoms, PSA screening  HPI: 72 year old male who I originally saw in 2022 for a possible filling defect in the bladder on CT, CT at that time was benign with a small prostate with lateral lobe hypertrophy and mild trabeculations, cytology was negative.  He had been on Flomax long-term for mild urinary symptoms of weak stream, at our visit in 2023 we had considered stopping that medication to see if symptoms change at all, but he ended up continuing Flomax.  We had also discussed UroLift at that time.  PVRs have been normal, prostate volume is 22 g from CT in March 2022.  He denies any major changes in his urinary symptoms over the last year, primary issue is weak stream that is minimally bothersome.  PVR normal again today at 50ml.  In terms of PSA screening, PSA has always been normal and less than 1, most recently 0.7 in July 2024.  We again reviewed the AUA guidelines regarding risks and benefits of PSA screening, and that routine screening is not recommended after age 38.  Reassurance provided regarding his very low values <1 and low risk of dying from prostate cancer.  He has ED, but not interested in treatment or medications at this time.  Okay to trial off Flomax, can refill if patient prefers to continue this medication RTC 1 year PVR   Sondra Come, MD  [facility] [address], [address] [place], [place] [address] [phone]

## 2022-08-26 NOTE — Patient Instructions (Signed)
Prostate Cancer Screening  Prostate cancer screening is testing that is done to check for the presence of prostate cancer in men. The prostate gland is a walnut-sized gland that is located below the bladder and in front of the rectum in males. The function of the prostate is to add fluid to semen during ejaculation. Prostate cancer is one of the most common types of cancer in men. Who should have prostate cancer screening? Screening recommendations vary based on age and other risk factors, as well as between the professional organizations who make the recommendations. In general, screening is recommended if: You are age 17 to 60 and have an average risk for prostate cancer. You should talk with your health care provider about your need for screening and how often screening should be done. Because most prostate cancers are slow growing and will not cause death, screening in this age group is generally reserved for men who have a 10- to 15-year life expectancy. You are younger than age 48, and you have these risk factors: Having a father, brother, or uncle who has been diagnosed with prostate cancer. The risk is higher if your family member's cancer occurred at an early age or if you have multiple family members with prostate cancer at an early age. Being a male who is [place] or is of [place] or sub-Saharan African descent. In general, screening is not recommended if: You are younger than age 52. You are between the ages of 54 and 47 and you have no risk factors. You are 16 years of age or older. At this age, the risks that screening can cause are greater than the benefits that it may provide. If you are at high risk for prostate cancer, your health care provider may recommend that you have screenings more often or that you start screening at a younger age. How is screening for prostate cancer done? The recommended prostate cancer screening test is a blood test called the prostate-specific antigen  (PSA) test. PSA is a protein that is made in the prostate. As you age, your prostate naturally produces more PSA. Abnormally high PSA levels may be caused by: Prostate cancer. An enlarged prostate that is not caused by cancer (benign prostatic hyperplasia, or BPH). This condition is very common in older men. A prostate gland infection (prostatitis) or urinary tract infection. Certain medicines such as male hormones (like testosterone) or other medicines that raise testosterone levels. A rectal exam may be done as part of prostate cancer screening to help provide information about the size of your prostate gland. When a rectal exam is performed, it should be done after the PSA level is drawn to avoid any effect on the results. Depending on the PSA results, you may need more tests, such as: A physical exam to check the size of your prostate gland, if not done as part of screening. Blood and imaging tests. A procedure to remove tissue samples from your prostate gland for testing (biopsy). This is the only way to know for certain if you have prostate cancer. What are the benefits of prostate cancer screening? Screening can help to identify cancer at an early stage, before symptoms start and when the cancer can be treated more easily. There is a small chance that screening may lower your risk of dying from prostate cancer. The chance is small because prostate cancer is a slow-growing cancer, and most men with prostate cancer die from a different cause. What are the risks of prostate cancer screening? The  main risk of prostate cancer screening is diagnosing and treating prostate cancer that would never have caused any symptoms or problems. This is called overdiagnosisand overtreatment. PSA screening cannot tell you if your PSA is high due to cancer or a different cause. A prostate biopsy is the only procedure to diagnose prostate cancer. Even the results of a biopsy may not tell you if your cancer needs to  be treated. Slow-growing prostate cancer may not need any treatment other than monitoring, so diagnosing and treating it may cause unnecessary stress or other side effects. Questions to ask your health care provider When should I start prostate cancer screening? What is my risk for prostate cancer? How often do I need screening? What type of screening tests do I need? How do I get my test results? What do my results mean? Do I need treatment? Where to find more information The American Cancer Society: www.cancer.org American Urological Association: www.auanet.org Contact a health care provider if: You have difficulty urinating. You have pain when you urinate or ejaculate. You have blood in your urine or semen. You have pain in your back or in the area of your prostate. Summary Prostate cancer is a common type of cancer in men. The prostate gland is located below the bladder and in front of the rectum. This gland adds fluid to semen during ejaculation. Prostate cancer screening may identify cancer at an early stage, when the cancer can be treated more easily and is less likely to have spread to other areas of the body. The prostate-specific antigen (PSA) test is the recommended screening test for prostate cancer, but it has associated risks. Discuss the risks and benefits of prostate cancer screening with your health care provider. If you are age 20 or older, the risks that screening can cause are greater than the benefits that it may provide. This information is not intended to replace advice given to you by your health care provider. Make sure you discuss any questions you have with your health care provider. Document Revised: 07/14/2020 Document Reviewed: 07/14/2020 Elsevier Patient Education  2024 Elsevier Inc.  Prostatic Urethral Lift (UroLift)  Prostatic urethral lift is a surgical procedure to treat symptoms of prostate gland enlargement that occurs with age (benign prostatic  hypertrophy, BPH). The urethra passes between the two lobes of the prostate. The urethra is the part of the body that drains urine from the bladder. As the prostate enlarges, it can push on the urethra and cause problems with urinating. This procedure involves placing an implant that holds the prostate away from the urethra. The procedure is done using a thin device called a cystoscope. The device is inserted through the tip of the penis and moved up the urethra to the prostate. This is less invasive than other procedures that require an incision. You may have this procedure if: You have symptoms of BPH. Your prostate is not severely enlarged. Medicines to treat BPH are not working or not tolerated. You want to avoid possible sexual side effects from medicines or other procedures that are used to treat BPH. Tell a health care provider about: Any allergies you have. All medicines you are taking, including vitamins, herbs, eye drops, creams, and over-the-counter medicines. Any problems you or family members have had with anesthetic medicines. Any bleeding problems you have. Any surgeries you have had. Any medical conditions you have. What are the risks? Generally, this is a safe procedure. However, problems may occur, including: Bleeding. Infection. Leaking of urine (incontinence). Allergic  reactions to medicines. Return of BPH symptoms after 2 years, requiring more treatment. What happens before the procedure? When to stop eating and drinking Follow instructions from your health care provider about what you may eat and drink before your procedure. These may include: 8 hours before your procedure Stop eating most foods. Do not eat meat, fried foods, or fatty foods. Eat only light foods, such as toast or crackers. All liquids are okay except energy drinks and alcohol. 6 hours before your procedure Stop eating. Drink only clear liquids, such as water, clear fruit juice, black coffee, plain  tea, and sports drinks. Do not drink energy drinks or alcohol. 2 hours before your procedure Stop drinking all liquids. You may be allowed to take medicines with small sips of water. If you do not follow your health care provider's instructions, your procedure may be delayed or canceled. Medicines Ask your health care provider about: Changing or stopping your regular medicines. This is especially important if you are taking diabetes medicines or blood thinners. Taking medicines such as aspirin and ibuprofen. These medicines can thin your blood. Do not take these medicines unless your health care provider tells you to take them. Taking over-the-counter medicines, vitamins, herbs, and supplements. Surgery safety Ask your health care provider what steps will be taken to help prevent infection. These steps may include: Removing hair at the surgery site. Washing skin with a germ-killing soap. Taking antibiotic medicine. General instructions Do not use any products that contain nicotine or tobacco for at least 4 weeks before the procedure. These products include cigarettes, chewing tobacco, and vaping devices, such as e-cigarettes. If you need help quitting, ask your health care provider. If you will be going home right after the procedure, plan to have a responsible adult: Take you home from the hospital or clinic. You will not be allowed to drive. Care for you for the time you are told. What happens during the procedure? An IV may be inserted into one of your veins. You will be given one or more of the following: A medicine to help you relax (sedative). A medicine that is injected into your urethra to numb the area (local anesthetic). A medicine to make you fall asleep (general anesthetic). A cystoscope will be inserted into your penis and moved through your urethra to your prostate. A device will be inserted through the cystoscope and used to press the lobes of your prostate away from your  urethra. Implants will be inserted through the device to hold the lobes of your prostate in the widened position. The device and cystoscope will be removed. The procedure may vary among health care providers and hospitals. What happens after the procedure? Your blood pressure, heart rate, breathing rate, and blood oxygen level be monitored until you leave the hospital or clinic. If you were given a sedative during the procedure, it can affect you for several hours. Do not drive or operate machinery until your health care provider says that it is safe. Summary Prostatic urethral lift is a surgical procedure to relieve symptoms of prostate gland enlargement that occurs with age (benign prostatic hypertrophy, BPH). The procedure is performed with a thin device called a cystoscope. This device is inserted through the tip of the penis and moved up the urethra to reach the prostate. This is less invasive than other procedures that require an incision. If you will be going home right after the procedure, plan to have a responsible adult take you home from the hospital or  clinic. You will not be allowed to drive. This information is not intended to replace advice given to you by your health care provider. Make sure you discuss any questions you have with your health care provider. Document Revised: 08/15/2020 Document Reviewed: 08/15/2020 Elsevier Patient Education  2024 [employer].

## 2022-09-08 ENCOUNTER — Ambulatory Visit (INDEPENDENT_AMBULATORY_CARE_PROVIDER_SITE_OTHER): Payer: Medicare Other | Admitting: Podiatry

## 2022-09-08 ENCOUNTER — Ambulatory Visit (INDEPENDENT_AMBULATORY_CARE_PROVIDER_SITE_OTHER): Payer: Medicare Other

## 2022-09-08 ENCOUNTER — Encounter: Payer: Self-pay | Admitting: Podiatry

## 2022-09-08 DIAGNOSIS — M25872 Other specified joint disorders, left ankle and foot: Secondary | ICD-10-CM

## 2022-09-08 DIAGNOSIS — D2372 Other benign neoplasm of skin of left lower limb, including hip: Secondary | ICD-10-CM | POA: Diagnosis not present

## 2022-09-08 DIAGNOSIS — M778 Other enthesopathies, not elsewhere classified: Secondary | ICD-10-CM | POA: Diagnosis not present

## 2022-09-08 MED ORDER — TRIAMCINOLONE ACETONIDE 40 MG/ML IJ SUSP
20.0000 mg | Freq: Once | INTRAMUSCULAR | Status: AC
Start: 2022-09-08 — End: 2022-09-08
  Administered 2022-09-08: 20 mg
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
## 2022-09-16 DIAGNOSIS — H2513 Age-related nuclear cataract, bilateral: Secondary | ICD-10-CM | POA: Diagnosis not present

## 2022-10-20 ENCOUNTER — Ambulatory Visit (INDEPENDENT_AMBULATORY_CARE_PROVIDER_SITE_OTHER): Payer: Medicare Other | Admitting: Podiatry

## 2022-10-20 ENCOUNTER — Encounter: Payer: Self-pay | Admitting: Podiatry

## 2022-10-20 DIAGNOSIS — M25872 Other specified joint disorders, left ankle and foot: Secondary | ICD-10-CM

## 2022-10-20 DIAGNOSIS — D2372 Other benign neoplasm of skin of left lower limb, including hip: Secondary | ICD-10-CM

## 2022-10-20 NOTE — Progress Notes (Signed)
He presents today for follow-up of his sesamoiditis states that is doing all right he states that I would say is probably about 75% improved.  Objective: Vital signs are stable he is alert and oriented x 3.  He has tenderness on palpation of the fibular sesamoid left foot.  There is no longer any erythema no edema no fluctuance noted.  He has some tenderness on palpation of the area and some tenderness on range of motion.  Assessment: Resolving sesamoiditis osteoarthritis fibular sesamoid.  Plan: Offered him an injection he declined will follow-up with me on an as-needed basis.

## 2022-10-22 ENCOUNTER — Other Ambulatory Visit: Payer: Self-pay

## 2022-10-22 DIAGNOSIS — Z87891 Personal history of nicotine dependence: Secondary | ICD-10-CM

## 2022-10-22 DIAGNOSIS — Z122 Encounter for screening for malignant neoplasm of respiratory organs: Secondary | ICD-10-CM

## 2022-11-03 ENCOUNTER — Ambulatory Visit (INDEPENDENT_AMBULATORY_CARE_PROVIDER_SITE_OTHER): Payer: Medicare Other | Admitting: Adult Health

## 2022-11-03 ENCOUNTER — Encounter: Payer: Self-pay | Admitting: Adult Health

## 2022-11-03 DIAGNOSIS — Z87891 Personal history of nicotine dependence: Secondary | ICD-10-CM

## 2022-11-03 NOTE — Progress Notes (Signed)
  Virtual Visit via Telephone Note  I connected with Edward [name]. , 11/03/22 9:30 AM by a telemedicine application and verified that I am speaking with the correct person using two identifiers.  Location: Patient: home Provider: home   I discussed the limitations of evaluation and management by telemedicine and the availability of in person appointments. The patient expressed understanding and agreed to proceed.   Shared Decision Making Visit Lung Cancer Screening Program [phone])   Eligibility: 72 y.o. Pack Years Smoking History Calculation  = 113 (# packs/per year x # years smoked) 55yrs x 2-3 ppd Recent History of coughing up blood  no Unexplained weight loss? no ( >Than 15 pounds within the last 6 months ) Prior History Lung / other cancer no (Diagnosis within the last 5 years already requiring surveillance chest CT Scans). Smoking Status Former Smoker Former Smokers: Years since quit: 13 years  Quit Date: 2011 Does still smoke pot   Visit Components: Discussion included one or more decision making aids. YES Discussion included risk/benefits of screening. YES Discussion included potential follow up diagnostic testing for abnormal scans. YES Discussion included meaning and risk of over diagnosis. YES Discussion included meaning and risk of False Positives. YES Discussion included meaning of total radiation exposure. YES  Counseling Included: Importance of adherence to annual lung cancer LDCT screening. YES Impact of comorbidities on ability to participate in the program. YES Ability and willingness to under diagnostic treatment. YES  Smoking Cessation Counseling: Former Smokers:  Discussed the importance of maintaining cigarette abstinence. yes Diagnosis Code: Personal History of Nicotine Dependence. U04.540 Information about tobacco cessation classes and interventions provided to patient. Yes Patient provided with "ticket" for LDCT Scan. yes Written Order for  Lung Cancer Screening with LDCT placed in Epic. Yes (CT Chest Lung Cancer Screening Low Dose W/O CM) [id number]  Z12.2-Screening of respiratory organs Z87.891-Personal history of nicotine dependence   [name] 11/03/22

## 2022-11-03 NOTE — Patient Instructions (Signed)

## 2022-11-05 ENCOUNTER — Ambulatory Visit
Admission: RE | Admit: 2022-11-05 | Discharge: 2022-11-05 | Disposition: A | Discharge status: Home / Self Care | Payer: Medicare Other | Source: Ambulatory Visit | Attending: Acute Care | Admitting: Acute Care

## 2022-11-05 DIAGNOSIS — Z122 Encounter for screening for malignant neoplasm of respiratory organs: Secondary | ICD-10-CM | POA: Diagnosis not present

## 2022-11-05 DIAGNOSIS — Z87891 Personal history of nicotine dependence: Secondary | ICD-10-CM

## 2022-11-13 DIAGNOSIS — Z23 Encounter for immunization: Secondary | ICD-10-CM | POA: Diagnosis not present

## 2022-11-22 ENCOUNTER — Other Ambulatory Visit: Payer: Self-pay | Admitting: Acute Care

## 2022-11-22 DIAGNOSIS — Z87891 Personal history of nicotine dependence: Secondary | ICD-10-CM

## 2022-11-22 DIAGNOSIS — Z122 Encounter for screening for malignant neoplasm of respiratory organs: Secondary | ICD-10-CM

## 2023-02-03 ENCOUNTER — Ambulatory Visit: Payer: Self-pay

## 2023-02-03 NOTE — Telephone Encounter (Signed)
 Summary: testical pain when sitting wrong   Pt called in states, he has pain in testical area, only when he sits on it wrong, last pain was 2 days ago, no appt sowing until March. Trying to get referral if needed.     Chief Complaint: Right testicular pain, swelling. Come and goes. Symptoms: Above Frequency: Yesterday Pertinent Negatives: Patient denies fever Disposition: [] ED /[] Urgent Care (no appt availability in office) / [x] Appointment(In office/virtual)/ []  [facility]/ [] Home Care/ [] Refused Recommended Disposition /[] [place] [facility]/ []  Follow-up with PCP Additional Notes: Agrees with appointment.  Reason for Disposition  [1] Pain comes and goes (intermittent) AND [2] present > 24 hours  Answer Assessment - Initial Assessment Questions 1. LOCATION and RADIATION: Where is the pain located?      Right side 2. QUALITY: What does the pain feel like?  (e.g., sharp, dull, aching, burning)     Sharp 3. SEVERITY: How bad is the pain?  (Scale 1-10; or mild, moderate, severe)   - MILD (1-3): doesn't interfere with normal activities    - MODERATE (4-7): interferes with normal activities (e.g., work or school) or awakens from sleep   - SEVERE (8-10): excruciating pain, unable to do any normal activities, difficulty walking     Moderate 4. ONSET: When did the pain start?     Yesterday 5. PATTERN: Does it come and go, or has it been constant since it started?     Comes and  goes 6. SCROTAL APPEARANCE: What does the scrotum look like? Is there any swelling or redness?      Swollen 7. HERNIA: Has a doctor ever told you that you have a hernia?     No 8. OTHER SYMPTOMS: Do you have any other symptoms? (e.g., abdomen pain, difficulty passing urine, fever, vomiting)     Swelling  Protocols used: Scrotum Pain-A-AH

## 2023-02-07 ENCOUNTER — Ambulatory Visit (INDEPENDENT_AMBULATORY_CARE_PROVIDER_SITE_OTHER): Payer: Medicare Other | Admitting: Family Medicine

## 2023-02-07 VITALS — BP 134/82 | HR 58 | Temp 97.9°F | Ht 70.0 in | Wt 227.0 lb

## 2023-02-07 DIAGNOSIS — K4091 Unilateral inguinal hernia, without obstruction or gangrene, recurrent: Secondary | ICD-10-CM

## 2023-02-07 DIAGNOSIS — K429 Umbilical hernia without obstruction or gangrene: Secondary | ICD-10-CM | POA: Diagnosis not present
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
## 2023-02-10 ENCOUNTER — Encounter: Payer: Self-pay | Admitting: Surgery

## 2023-02-10 ENCOUNTER — Ambulatory Visit (INDEPENDENT_AMBULATORY_CARE_PROVIDER_SITE_OTHER): Payer: Medicare Other | Admitting: Surgery

## 2023-02-10 VITALS — BP 154/90 | HR 58 | Temp 98.8°F | Ht 70.0 in | Wt 222.8 lb

## 2023-02-10 DIAGNOSIS — K4090 Unilateral inguinal hernia, without obstruction or gangrene, not specified as recurrent: Secondary | ICD-10-CM

## 2023-02-10 DIAGNOSIS — K432 Incisional hernia without obstruction or gangrene: Secondary | ICD-10-CM

## 2023-02-10 DIAGNOSIS — R1030 Lower abdominal pain, unspecified: Secondary | ICD-10-CM

## 2023-02-10 NOTE — Patient Instructions (Addendum)
 Your CT is scheduled for 02/21/2023 9 am (arrive by 8:45 am) [facility].   Inguinal Hernia, Adult An inguinal hernia is when fat or your intestines push through a weak spot in a muscle where your leg meets your lower belly (groin). This causes a bulge. This kind of hernia could also be: In your scrotum, if you are male. In folds of skin around your vagina, if you are male. There are three types of inguinal hernias: Hernias that can be pushed back into the belly (are reducible). This type rarely causes pain. Hernias that cannot be pushed back into the belly (are incarcerated). Hernias that cannot be pushed back into the belly and lose their blood supply (are strangulated). This type needs emergency surgery. What are the causes? This condition is caused by having a weak spot in the muscles or tissues in your groin. This develops over time. The hernia may poke through the weak spot when you strain your lower belly muscles all of a sudden, such as when you: Lift a heavy object. Strain to poop (have a bowel movement). Trouble pooping (constipation) can lead to straining. Cough. What increases the risk? This condition is more likely to develop in: Males. Pregnant females. People who: Are overweight. Work in jobs that require long periods of standing or heavy lifting. Have had an inguinal hernia before. Smoke or have lung disease. These factors can lead to long-term (chronic) coughing. What are the signs or symptoms? Symptoms may depend on the size of the hernia. Often, a small hernia has no symptoms. Symptoms of a larger hernia may include: A bulge in the groin area. This is easier to see when standing. You might not be able to see it when you are lying down. Pain or burning in the groin. This may get worse when you lift, strain, or cough. A dull ache or a feeling of pressure in the groin. An abnormal bulge in the scrotum, in males. Symptoms of a strangulated inguinal hernia may  include: A bulge in your groin that is very painful and tender to the touch. A bulge that turns red or purple. Fever, feeling like you may vomit (nausea), and vomiting. Not being able to poop or to pass gas. How is this treated? Treatment depends on the size of your hernia and whether you have symptoms. If you do not have symptoms, your doctor may have you watch your hernia carefully and have you come in for follow-up visits. If your hernia is large or if you have symptoms, you may need surgery to repair the hernia. Follow these instructions at home: Lifestyle Avoid lifting heavy objects. Avoid standing for long amounts of time. Do not smoke or use any products that contain nicotine or tobacco. If you need help quitting, ask your doctor. Stay at a healthy weight. Prevent trouble pooping You may need to take these actions to prevent or treat trouble pooping: Drink enough fluid to keep your pee (urine) pale yellow. Take over-the-counter or prescription medicines. Eat foods that are high in fiber. These include beans, whole grains, and fresh fruits and vegetables. Limit foods that are high in fat and sugar. These include fried or sweet foods. General instructions You may try to push your hernia back in place by very gently pressing on it when you are lying down. Do not try to push the bulge back in if it will not go in easily. Watch your hernia for any changes in shape, size, or color. Tell your doctor  if you see any changes. Take over-the-counter and prescription medicines only as told by your doctor. Keep all follow-up visits. Contact a doctor if: You have a fever or chills. You have new symptoms. Your symptoms get worse. Get help right away if: You have pain in your groin that gets worse all of a sudden. You have a bulge in your groin that: Gets bigger all of a sudden, and it does not get smaller after that. Turns red or purple. Is painful when you touch it. You are a male, and you  have: Sudden pain in your scrotum. A sudden change in the size of your scrotum. You cannot push the hernia back in place by very gently pressing on it when you are lying down. You feel like you may vomit, and that feeling does not go away. You keep vomiting. You have a fast heartbeat. You cannot poop or pass gas. These symptoms may be an emergency. Get help right away. Call your local emergency services (911 in the [place] Do not wait to see if the symptoms will go away. Do not drive yourself to the hospital. Summary An inguinal hernia is when fat or your intestines push through a weak spot in a muscle where your leg meets your lower belly (groin). This causes a bulge. If you do not have symptoms, you may not need treatment. If you have symptoms or a large hernia, you may need surgery. Avoid lifting heavy objects. Also, avoid standing for long amounts of time. Do not try to push the bulge back in if it will not go in easily. This information is not intended to replace advice given to you by your health care provider. Make sure you discuss any questions you have with your health care provider. Document Revised: 09/18/2019 Document Reviewed: 09/18/2019 Elsevier Patient Education  2024 [employer].

## 2023-02-12 DIAGNOSIS — K432 Incisional hernia without obstruction or gangrene: Secondary | ICD-10-CM

## 2023-02-12 DIAGNOSIS — R1030 Lower abdominal pain, unspecified: Secondary | ICD-10-CM | POA: Insufficient documentation

## 2023-02-12 HISTORY — DX: Incisional hernia without obstruction or gangrene: K43.2

## 2023-02-17 ENCOUNTER — Ambulatory Visit: Payer: Medicare Other | Attending: Internal Medicine | Admitting: Internal Medicine

## 2023-02-17 ENCOUNTER — Encounter: Payer: Self-pay | Admitting: Internal Medicine

## 2023-02-17 VITALS — BP 126/78 | HR 56 | Ht 70.0 in | Wt 225.8 lb

## 2023-02-17 DIAGNOSIS — Z01810 Encounter for preprocedural cardiovascular examination: Secondary | ICD-10-CM

## 2023-02-17 DIAGNOSIS — I10 Essential (primary) hypertension: Secondary | ICD-10-CM | POA: Diagnosis not present

## 2023-02-17 DIAGNOSIS — I2510 Atherosclerotic heart disease of native coronary artery without angina pectoris: Secondary | ICD-10-CM | POA: Diagnosis not present

## 2023-02-17 DIAGNOSIS — I255 Ischemic cardiomyopathy: Secondary | ICD-10-CM

## 2023-02-17 DIAGNOSIS — E782 Mixed hyperlipidemia: Secondary | ICD-10-CM

## 2023-02-17 DIAGNOSIS — I5022 Chronic systolic (congestive) heart failure: Secondary | ICD-10-CM | POA: Diagnosis not present

## 2023-02-17 MED ORDER — CLOPIDOGREL BISULFATE 75 MG PO TABS: 75.0000 mg | ORAL_TABLET | Freq: Every day | ORAL | 3 refills | Status: DC

## 2023-02-17 MED ORDER — LOSARTAN POTASSIUM 50 MG PO TABS: 50.0000 mg | ORAL_TABLET | Freq: Every day | ORAL | 3 refills | Status: DC

## 2023-02-17 MED ORDER — HYDROCHLOROTHIAZIDE 25 MG PO TABS: 25.0000 mg | ORAL_TABLET | Freq: Every day | ORAL | 3 refills | Status: DC

## 2023-02-17 MED ORDER — NITROGLYCERIN 0.4 MG SL SUBL: 0.4000 mg | SUBLINGUAL_TABLET | SUBLINGUAL | 3 refills | Status: AC | PRN

## 2023-02-17 MED ORDER — ATORVASTATIN CALCIUM 80 MG PO TABS: 80.0000 mg | ORAL_TABLET | Freq: Every day | ORAL | 3 refills | Status: DC

## 2023-02-17 NOTE — Patient Instructions (Signed)
Medication Instructions:  Your physician recommends the following medication changes.  STOP TAKING: Carvedilol    *If you need a refill on your cardiac medications before your next appointment, please call your pharmacy*   Lab Work: No labs ordered today    Testing/Procedures: No test ordered today    Follow-Up: At [facility], you and your health needs are our priority.  As part of our continuing mission to provide you with exceptional heart care, we have created designated Provider Care Teams.  These Care Teams include your primary Cardiologist (physician) and Advanced Practice Providers (APPs -  Physician Assistants and Nurse Practitioners) who all work together to provide you with the care you need, when you need it.  We recommend signing up for the patient portal called "MyChart".  Sign up information is provided on this After Visit Summary.  MyChart is used to connect with patients for Virtual Visits (Telemedicine).  Patients are able to view lab/test results, encounter notes, upcoming appointments, etc.  Non-urgent messages can be sent to your provider as well.   To learn more about what you can do with MyChart, go to [email or web address].    Your next appointment:   6 month(s)  Provider:   You may see Yvonne Kendall, MD or one of the following Advanced Practice Providers on your designated Care Team:   Nicolasa Ducking, NP Eula Listen, PA-C Cadence Fransico Michael, PA-C Charlsie Quest, NP Carlos Levering, NP
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
## 2023-02-18 ENCOUNTER — Encounter: Payer: Self-pay | Admitting: Internal Medicine

## 2023-02-21 ENCOUNTER — Ambulatory Visit
Admission: RE | Admit: 2023-02-21 | Discharge: 2023-02-21 | Disposition: A | Discharge status: Home / Self Care | Payer: Medicare Other | Source: Ambulatory Visit | Attending: Surgery | Admitting: Surgery

## 2023-02-21 DIAGNOSIS — R1030 Lower abdominal pain, unspecified: Secondary | ICD-10-CM | POA: Insufficient documentation

## 2023-02-21 DIAGNOSIS — K5730 Diverticulosis of large intestine without perforation or abscess without bleeding: Secondary | ICD-10-CM | POA: Diagnosis not present

## 2023-02-21 DIAGNOSIS — K4090 Unilateral inguinal hernia, without obstruction or gangrene, not specified as recurrent: Secondary | ICD-10-CM | POA: Diagnosis not present

## 2023-02-21 DIAGNOSIS — K429 Umbilical hernia without obstruction or gangrene: Secondary | ICD-10-CM | POA: Diagnosis not present

## 2023-02-21 MED ORDER — IOHEXOL 300 MG/ML  SOLN
100.0000 mL | Freq: Once | INTRAMUSCULAR | Status: AC | PRN
  Administered 2023-02-21: 100 mL via INTRAVENOUS

## 2023-02-23 ENCOUNTER — Telehealth: Payer: Self-pay | Admitting: Internal Medicine

## 2023-02-23 NOTE — Telephone Encounter (Signed)
All medications sent to [facility]- confirmation received.

## 2023-02-23 NOTE — Telephone Encounter (Signed)
   Pt called to confirm that all his medications were sent to his new pharmacy, [facility]. He isn't sure if [facility] has a different address, but he wants to provide his information to ensure that it was sent to the correct location.  [facility] script  Member number - [id number]  Plan number [id number] [address] Humbles [id number]  [facility] - [id number]  Provider number [phone] Online: esrx  Also, pt requested we can delete his old mail order pharmacy [facility] on his record
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
## 2023-03-01 ENCOUNTER — Ambulatory Visit: Payer: Self-pay | Admitting: Surgery

## 2023-03-01 ENCOUNTER — Encounter: Payer: Self-pay | Admitting: Surgery

## 2023-03-01 ENCOUNTER — Ambulatory Visit (INDEPENDENT_AMBULATORY_CARE_PROVIDER_SITE_OTHER): Payer: Medicare Other | Admitting: Surgery

## 2023-03-01 VITALS — BP 137/87 | HR 60 | Temp 98.7°F | Ht 70.0 in | Wt 225.0 lb

## 2023-03-01 DIAGNOSIS — K4090 Unilateral inguinal hernia, without obstruction or gangrene, not specified as recurrent: Secondary | ICD-10-CM

## 2023-03-01 DIAGNOSIS — K432 Incisional hernia without obstruction or gangrene: Secondary | ICD-10-CM

## 2023-03-01 NOTE — Patient Instructions (Addendum)
Advised to pursue a goal of 25 to 30 g of fiber daily.  Made aware that the majority of this may be through natural sources, but advised to be aware of actual consumption and to ensure minimal consumption by daily supplementation.  Various forms of supplements discussed.  Recommended Psyllium husk, that mixes well with applesauce, or the powder which goes down well shaken with chocolate milk.  Strongly advised to consume more fluids to ensure adequate hydration, instructed to watch color of urine to determine adequacy of hydration.  Clarity is pursued in urine output, and bowel activity that correlates to significant meal intake.   We need to avoid deferring having bowel movements, advised to take the time at the first sign of sensation, typically following meals, and in the morning.   Subsequent utilization of MiraLAX may be needed ensure at least daily movement, ideally twice daily bowel movements.  If multiple doses of MiraLAX are necessary utilize them. Never skip a day...  To be regular, we must do the above EVERY day.    You have chose to have your hernia repaired. This will be done by Dr. Claudine Mouton at [facility].  Please see your (blue) Pre-care information that you have been given today. Our surgery scheduler will call you to verify surgery date and to go over information.   You will need to arrange to be out of work for approximately 1-2 weeks and then you may return with a lifting restriction for 4 more weeks. If you have FMLA or Disability paperwork that needs to be filled out, please have your company fax your paperwork to [phone] or you may drop this by either office. This paperwork will be filled out within 3 days after your surgery has been completed.  You may have a bruise in your groin and also swelling and brusing in your testicle area. You may use ice 4-5 times daily for 15-20 minutes each time. Make sure that you place a barrier between you and the ice pack. To decrease  the swelling, you may roll up a bath towel and place it vertically in between your thighs with your testicles resting on the towel. You will want to keep this area elevated as much as possible for several days following surgery.    Inguinal Hernia, Adult Muscles help keep everything in the body in its proper place. But if a weak spot in the muscles develops, something can poke through. That is called a hernia. When this happens in the lower part of the belly (abdomen), it is called an inguinal hernia. (It takes its name from a part of the body in this region called the inguinal canal.) A weak spot in the wall of muscles lets some fat or part of the small intestine bulge through. An inguinal hernia can develop at any age. Men get them more often than women. CAUSES  In adults, an inguinal hernia develops over time. It can be triggered by: Suddenly straining the muscles of the lower abdomen. Lifting heavy objects. Straining to have a bowel movement. Difficult bowel movements (constipation) can lead to this. Constant coughing. This may be caused by smoking or lung disease. Being overweight. Being pregnant. Working at a job that requires long periods of standing or heavy lifting. Having had an inguinal hernia before. One type can be an emergency situation. It is called a strangulated inguinal hernia. It develops if part of the small intestine slips through the weak spot and cannot get back into the abdomen.  The blood supply can be cut off. If that happens, part of the intestine may die. This situation requires emergency surgery. SYMPTOMS  Often, a small inguinal hernia has no symptoms. It is found when a healthcare provider does a physical exam. Larger hernias usually have symptoms.  In adults, symptoms may include: A lump in the groin. This is easier to see when the person is standing. It might disappear when lying down. In men, a lump in the scrotum. Pain or burning in the groin. This occurs  especially when lifting, straining or coughing. A dull ache or feeling of pressure in the groin. Signs of a strangulated hernia can include: A bulge in the groin that becomes very painful and tender to the touch. A bulge that turns red or purple. Fever, nausea and vomiting. Inability to have a bowel movement or to pass gas. DIAGNOSIS  To decide if you have an inguinal hernia, a healthcare provider will probably do a physical examination. This will include asking questions about any symptoms you have noticed. The healthcare provider might feel the groin area and ask you to cough. If an inguinal hernia is felt, the healthcare provider may try to slide it back into the abdomen. Usually no other tests are needed. TREATMENT  Treatments can vary. The size of the hernia makes a difference. Options include: Watchful waiting. This is often suggested if the hernia is small and you have had no symptoms. No medical procedure will be done unless symptoms develop. You will need to watch closely for symptoms. If any occur, contact your healthcare provider right away. Surgery. This is used if the hernia is larger or you have symptoms. Open surgery. This is usually an outpatient procedure (you will not stay overnight in a hospital). An cut (incision) is made through the skin in the groin. The hernia is put back inside the abdomen. The weak area in the muscles is then repaired by herniorrhaphy or hernioplasty. Herniorrhaphy: in this type of surgery, the weak muscles are sewn back together. Hernioplasty: a patch or mesh is used to close the weak area in the abdominal wall. Laparoscopy. In this procedure, a surgeon makes small incisions. A thin tube with a tiny video camera (called a laparoscope) is put into the abdomen. The surgeon repairs the hernia with mesh by looking with the video camera and using two long instruments. HOME CARE INSTRUCTIONS  After surgery to repair an inguinal hernia: You will need to take  pain medicine prescribed by your healthcare provider. Follow all directions carefully. You will need to take care of the wound from the incision. Your activity will be restricted for awhile. This will probably include no heavy lifting for several weeks. You also should not do anything too active for a few weeks. When you can return to work will depend on the type of job that you have. During "watchful waiting" periods, you should: Maintain a healthy weight. Eat a diet high in fiber (fruits, vegetables and whole grains). Drink plenty of fluids to avoid constipation. This means drinking enough water and other liquids to keep your urine clear or pale yellow. Do not lift heavy objects. Do not stand for long periods of time. Quit smoking. This should keep you from developing a frequent cough. SEEK MEDICAL CARE IF:  A bulge develops in your groin area. You feel pain, a burning sensation or pressure in the groin. This might be worse if you are lifting or straining. You develop a fever of more than 100.5  F (38.1 C). SEEK IMMEDIATE MEDICAL CARE IF:  Pain in the groin increases suddenly. A bulge in the groin gets bigger suddenly and does not go down. For men, there is sudden pain in the scrotum. Or, the size of the scrotum increases. A bulge in the groin area becomes red or purple and is painful to touch. You have nausea or vomiting that does not go away. You feel your heart beating much faster than normal. You cannot have a bowel movement or pass gas. You develop a fever of more than 102.0 F (38.9 C).   This information is not intended to replace advice given to you by your health care provider. Make sure you discuss any questions you have with your health care provider.   Document Released: 06/06/2008 Document Revised: 04/12/2011 Document Reviewed: 07/22/2014 Elsevier Interactive Patient Education [employer]

## 2023-03-02 ENCOUNTER — Telehealth: Payer: Self-pay | Admitting: Surgery

## 2023-03-02 NOTE — Telephone Encounter (Signed)
Patient has been advised of Pre-Admission date/time, and Surgery date at [facility].  Surgery Date: 03/09/23 Preadmission Testing Date: 03/04/23 (phone 1p-4p)  Patient has been made aware to call [phone], between 1-3:00pm the day before surgery, to find out what time to arrive for surgery.

## 2023-03-02 NOTE — Telephone Encounter (Signed)
Patient has been advised of Pre-Admission date/time, and Surgery date at [facility].  Surgery Date: 03/09/23 Preadmission Testing Date: 03/04/23 (phone 1p-4p)  Patient has been made aware to call [phone], between 1-3:00pm the day before surgery, to find out what time to arrive for surgery.    Patient also reminded to hold Plavix 5 days prior to surgery per Dr.  Claudine Mouton, but may continue with baby aspirin in the interim.

## 2023-03-04 ENCOUNTER — Encounter
Admission: RE | Admit: 2023-03-04 | Discharge: 2023-03-04 | Disposition: A | Discharge status: Home / Self Care | Payer: Medicare Other | Source: Ambulatory Visit | Attending: Surgery | Admitting: Surgery

## 2023-03-04 ENCOUNTER — Other Ambulatory Visit: Payer: Self-pay

## 2023-03-04 NOTE — Patient Instructions (Addendum)
Your procedure is scheduled on: Wednesday 03/09/23 To find out your arrival time, please call [phone] between 1PM - 3PM on:   Tuesday Report to the Registration Desk on the 1st floor of the [facility]. Valet parking is available.  If your arrival time is 6:00 am, do not arrive before that time as the [facility] entrance doors do not open until 6:00 am.  REMEMBER: Instructions that are not followed completely may result in serious medical risk, up to and including death; or upon the discretion of your surgeon and anesthesiologist your surgery may need to be rescheduled.  Do not eat food or drink liquids after midnight the night before surgery.  No gum chewing or hard candies.  One week prior to surgery: Stop Anti-inflammatories (NSAIDS) such as Advil, Aleve, Ibuprofen, Motrin, Naproxen, Naprosyn and Aspirin based products such as Excedrin, Goody's Powder, BC Powder. You may however, continue to take Tylenol if needed for pain up until the day of surgery.  Stop ANY OVER THE COUNTER supplements and vitamins Today 03/04/23 until after surgery.  Continue taking all prescribed medications with the exception of the following: Blood thinner (clopidogrel/Plavix) last dose Thursday 03/03/23 and start Aspirin 81 mg as instructed  TAKE ONLY THESE MEDICATIONS THE MORNING OF SURGERY WITH A SIP OF WATER:  none  No Alcohol for 24 hours before or after surgery.  No Smoking including e-cigarettes for 24 hours before surgery.  No chewable tobacco products for at least 6 hours before surgery.  No nicotine patches on the day of surgery.  Do not use any "recreational" drugs for at least a week (preferably 2 weeks) before your surgery.  Please be advised that the combination of cocaine and anesthesia may have negative outcomes, up to and including death. If you test positive for cocaine, your surgery will be cancelled.  On the morning of surgery brush your teeth with toothpaste and water, you may  rinse your mouth with mouthwash if you wish. Do not swallow any toothpaste or mouthwash.  Use CHG Soap or wipes as directed on instruction sheet.  Do not wear lotions, powders, or perfumes.   Do not shave body hair from the neck down 48 hours before surgery.  Wear clean comfortable clothing (specific to your surgery type) to the hospital.  Do not wear jewelry, make-up, hairpins, clips or nail polish.  For welded (permanent) jewelry: bracelets, anklets, waist bands, etc.  Please have this removed prior to surgery.  If it is not removed, there is a chance that hospital personnel will need to cut it off on the day of surgery. Contact lenses, hearing aids and dentures may not be worn into surgery.  Do not bring valuables to the hospital. [facility] is not responsible for any missing/lost belongings or valuables.   Notify your doctor if there is any change in your medical condition (cold, fever, infection).  If you are being discharged the day of surgery, you will not be allowed to drive home. You will need a responsible individual to drive you home and stay with you for 24 hours after surgery.   If you are taking public transportation, you will need to have a responsible individual with you.  If you are being admitted to the hospital overnight, leave your suitcase in the car. After surgery it may be brought to your room.  In case of increased patient census, it may be necessary for you, the patient, to continue your postoperative care in the Same Day Surgery department.  After surgery, you can help prevent lung complications by doing breathing exercises.  Take deep breaths and cough every 1-2 hours. Your doctor may order a device called an Incentive Spirometer to help you take deep breaths. When coughing or sneezing, hold a pillow firmly against your incision with both hands. This is called "splinting." Doing this helps protect your incision. It also decreases belly discomfort.  Surgery  Visitation Policy:  Patients undergoing a surgery or procedure may have two family members or support persons with them as long as the person is not COVID-19 positive or experiencing its symptoms.   Inpatient Visitation:    Visiting hours are 7 a.m. to 8 p.m. Up to four visitors are allowed at one time in a patient room. The visitors may rotate out with other people during the day. One designated support person (adult) may remain overnight.  Due to an increase in RSV, Covid and influenza rates and associated hospitalizations, children ages 30 and under will not be able to visit patients in [facility] hospitals. Masks continue to be strongly recommended.  Please call the Pre-admissions Testing Dept. at [phone] if you have any questions about these instructions.     Preparing for Surgery with CHLORHEXIDINE GLUCONATE (CHG) Soap  Chlorhexidine Gluconate (CHG) Soap  o An antiseptic cleaner that kills germs and bonds with the skin to continue killing germs even after washing  o Used for showering the night before surgery and morning of surgery  Before surgery, you can play an important role by reducing the number of germs on your skin.  CHG (Chlorhexidine gluconate) soap is an antiseptic cleanser which kills germs and bonds with the skin to continue killing germs even after washing.  Please do not use if you have an allergy to CHG or antibacterial soaps. If your skin becomes reddened/irritated stop using the CHG.  1. Shower the NIGHT BEFORE SURGERY and the MORNING OF SURGERY with CHG soap.  2. If you choose to wash your hair, wash your hair first as usual with your normal shampoo.  3. After shampooing, rinse your hair and body thoroughly to remove the shampoo.  4. Use CHG as you would any other liquid soap. You can apply CHG directly to the skin and wash gently with a scrungie or a clean washcloth.  5. Apply the CHG soap to your body only from the neck down. Do not use on open  wounds or open sores. Avoid contact with your eyes, ears, mouth, and genitals (private parts). Wash face and genitals (private parts) with your normal soap.  6. Wash thoroughly, paying special attention to the area where your surgery will be performed.  7. Thoroughly rinse your body with warm water.  8. Do not shower/wash with your normal soap after using and rinsing off the CHG soap.  9. Pat yourself dry with a clean towel.  10. Wear clean pajamas to bed the night before surgery.  12. Place clean sheets on your bed the night of your first shower and do not sleep with pets.  13. Shower again with the CHG soap on the day of surgery prior to arriving at the hospital.  14. Do not apply any deodorants/lotions/powders.  15. Please wear clean clothes to the hospital.

## 2023-03-07 ENCOUNTER — Encounter: Payer: Self-pay | Admitting: Surgery

## 2023-03-08 ENCOUNTER — Encounter
Admission: RE | Admit: 2023-03-08 | Discharge: 2023-03-08 | Disposition: A | Discharge status: Home / Self Care | Payer: Medicare Other | Source: Ambulatory Visit | Attending: Surgery | Admitting: Surgery

## 2023-03-08 ENCOUNTER — Encounter: Payer: Self-pay | Admitting: Family Medicine

## 2023-03-08 ENCOUNTER — Encounter: Payer: Self-pay | Admitting: Surgery

## 2023-03-08 ENCOUNTER — Ambulatory Visit (INDEPENDENT_AMBULATORY_CARE_PROVIDER_SITE_OTHER): Payer: Medicare Other | Admitting: Family Medicine

## 2023-03-08 VITALS — BP 124/88 | HR 58 | Temp 98.0°F | Resp 16 | Ht 70.0 in | Wt 223.0 lb

## 2023-03-08 DIAGNOSIS — Z01812 Encounter for preprocedural laboratory examination: Secondary | ICD-10-CM | POA: Diagnosis not present

## 2023-03-08 DIAGNOSIS — K4090 Unilateral inguinal hernia, without obstruction or gangrene, not specified as recurrent: Secondary | ICD-10-CM | POA: Insufficient documentation

## 2023-03-08 DIAGNOSIS — R7303 Prediabetes: Secondary | ICD-10-CM | POA: Diagnosis not present

## 2023-03-08 DIAGNOSIS — I10 Essential (primary) hypertension: Secondary | ICD-10-CM | POA: Diagnosis not present

## 2023-03-08 DIAGNOSIS — K432 Incisional hernia without obstruction or gangrene: Secondary | ICD-10-CM | POA: Insufficient documentation

## 2023-03-08 DIAGNOSIS — I2510 Atherosclerotic heart disease of native coronary artery without angina pectoris: Secondary | ICD-10-CM | POA: Diagnosis not present

## 2023-03-08 DIAGNOSIS — R39198 Other difficulties with micturition: Secondary | ICD-10-CM | POA: Diagnosis not present

## 2023-03-08 LAB — CBC WITH DIFFERENTIAL/PLATELET
Abs Immature Granulocytes: 0.02 10*3/uL (ref 0.00–0.07)
Basophils Absolute: 0.1 10*3/uL (ref 0.0–0.1)
Basophils Relative: 1 %
Eosinophils Absolute: 0.2 10*3/uL (ref 0.0–0.5)
Eosinophils Relative: 3 %
HCT: 44.6 % (ref 39.0–52.0)
Hemoglobin: 15.3 g/dL (ref 13.0–17.0)
Immature Granulocytes: 0 %
Lymphocytes Relative: 31 %
Lymphs Abs: 2.3 10*3/uL (ref 0.7–4.0)
MCH: 32.4 pg (ref 26.0–34.0)
MCHC: 34.3 g/dL (ref 30.0–36.0)
MCV: 94.5 fL (ref 80.0–100.0)
Monocytes Absolute: 0.8 10*3/uL (ref 0.1–1.0)
Monocytes Relative: 11 %
Neutro Abs: 4.2 10*3/uL (ref 1.7–7.7)
Neutrophils Relative %: 54 %
Platelets: 298 10*3/uL (ref 150–400)
RBC: 4.72 MIL/uL (ref 4.22–5.81)
RDW: 13.4 % (ref 11.5–15.5)
WBC: 7.6 10*3/uL (ref 4.0–10.5)
nRBC: 0 % (ref 0.0–0.2)

## 2023-03-08 LAB — COMPREHENSIVE METABOLIC PANEL
ALT: 21 U/L (ref 0–44)
AST: 20 U/L (ref 15–41)
Albumin: 3.8 g/dL (ref 3.5–5.0)
Alkaline Phosphatase: 40 U/L (ref 38–126)
Anion gap: 10 (ref 5–15)
BUN: 19 mg/dL (ref 8–23)
CO2: 25 mmol/L (ref 22–32)
Calcium: 8.8 mg/dL — ABNORMAL LOW (ref 8.9–10.3)
Chloride: 102 mmol/L (ref 98–111)
Creatinine, Ser: 0.83 mg/dL (ref 0.61–1.24)
GFR, Estimated: >60 mL/min (ref >60)
Glucose, Bld: 126 mg/dL — ABNORMAL HIGH (ref 70–99)
Potassium: 3.7 mmol/L (ref 3.5–5.1)
Sodium: 137 mmol/L (ref 135–145)
Total Bilirubin: 0.9 mg/dL (ref 0.0–1.2)
Total Protein: 7.1 g/dL (ref 6.5–8.1)

## 2023-03-08 MED ORDER — TAMSULOSIN HCL 0.4 MG PO CAPS: 0.4000 mg | ORAL_CAPSULE | Freq: Every day | ORAL | 1 refills | Status: DC

## 2023-03-08 NOTE — Progress Notes (Signed)
 Perioperative / Anesthesia Services  Pre-Admission Testing Clinical Review / Pre-Operative Anesthesia Consult  Date: 03/08/23  Patient Demographics:  Name: Edward [name]. DOB: [birth year] MRN:   [id number]  Planned Surgical Procedure(s):    Case: [id number] Date/Time: 03/09/23 1130   Procedures:      XI ROBOTIC ASSISTED INGUINAL HERNIA (Right)     HERNIA REPAIR UMBILICAL ADULT   Anesthesia type: General   Pre-op diagnosis:      right inguinal hernia      incisional hernia incarcerated less 3 cm initial   Location: [facility] OR ROOM 04 / [facility]   Surgeons: Lane Shope, MD      NOTE: Available PAT nursing documentation and vital signs have been reviewed. Clinical nursing staff has updated patient's PMH/PSHx, current medication list, and drug allergies/intolerances to ensure comprehensive history available to assist in medical decision making as it pertains to the aforementioned surgical procedure and anticipated anesthetic course. Extensive review of available clinical information personally performed. Rico PMH and PSHx updated with any diagnoses/procedures that  may have been inadvertently omitted during his intake with the pre-admission testing department's nursing staff.  Clinical Discussion:  Edward [name]. is a 73 y.o. male who is submitted for pre-surgical anesthesia review and clearance prior to him undergoing the above procedure. Patient is a Former Smoker (52 pack years; quit 12/2009). Pertinent PMH includes: COPD, anterior STEMI, ischemic cardiomyopathy, HFrEF, apical mural thrombus, atypical angina, IRBBB, BILATERAL carotid artery disease, aortic atherosclerosis, HTN, HLD, multiple pulmonary nodules, pulmonary emphysema, GERD (no daily Tx), umbilical hernia, RIGHT inguinal hernia, OA, ETOH use, marijuana use, anxiety.  Patient is followed by cardiology (End, MD). He was last seen in the cardiology clinic on 02/17/2023; notes reviewed. At the time of  his clinic visit, patient doing well overall from a cardiovascular perspective. He was experiencing upper respiratory symptoms and intermittent episodes of palpitations. Patient denied any chest pain, shortness of breath, PND, orthopnea, significant peripheral edema, weakness, fatigue, vertiginous symptoms, or presyncope/syncope. Patient with a past medical history significant for cardiovascular diagnoses. Documented physical exam was grossly benign, providing no evidence of acute exacerbation and/or decompensation of the patient's known cardiovascular conditions.  Patient suffered an anterior wall STEMI on 03/09/2016.  Patient presented late for treatment; approximately 18 hours following onset.  He underwent diagnostic LEFT heart catheterization that demonstrated thrombotic occlusion of the proximal/mid LAD involving both D1 and D2.  There was mild to moderate nonobstructive coronary artery disease noted within the LCx and RCA.  Patient underwent successful IVUS guided PCI to the proximal and mid LAD placing a 3.0 x 38 mm Integrity Resolute DES x 1.  Following PCI, D2 was noted to be jailed by the LAD stent resulting and a 70% ostial stenosis, however TIMI-3 flow was maintained.  Myocardial perfusion imaging study was performed on 08/28/2016 revealing a mildly reduced left ventricular systolic function with an EF of 45%.  Hypokinesis of the distal anteroseptal and apical wall was noted.  Additionally, there was a moderate fixed defect in the mid to distal anteroseptal wall and apical region.  There was no evidence of significant ischemia.  Study determined to be moderate risk overall.  Repeat diagnostic LEFT heart catheterization was performed on 09/20/2017 revealing multivessel CAD; 70% ostial D1 (jailed due to stent), 50% proximal LCx, 100% OM1, 30% ostial OM2-OM2, 30% proximal RCA, and 50% posterior atrioventricular artery.  Previously placed stent to the LAD was noted to be widely patent.  Interventional  cardiology made the decision to defer further intervention opting for continued medical management.  Carotid Doppler study performed on 10/13/2017 revealed 1-39% stenosis of the patient's BILATERAL internal carotid arteries.  Vertebrals demonstrated antegrade flow.  Subclavian's with normal flow hemodynamics bilaterally.  Following patient's STEMI, patient with an ischemic cardiomyopathy resulting in HFrEF.  Cardiac function has been serially monitored, with most recent TTE being performed on 08/10/2019.  Echocardiogram showed a normal left ventricular systolic function with an EF of 55%.  Anteroseptal/septal wall hypokinesis secondary to possible conduction abnormality (bundle branch block. Left ventricular diastolic Doppler parameters consistent with abnormal relaxation (G1DD).  Right ventricular size and function normal.  Left atrium mildly dilated.  There was no significant valvular regurgitation.  All transvalvular gradients were noted be normal providing no evidence suggestive of valvular stenosis.  Aorta normal in size with no evidence of ectasia or aneurysmal dilatation.  Following stent placement, patient remains on daily antithrombotic therapy using clopidogrel .  Patient is reported be compliant with therapy with no evidence reports of GI/GU related bleeding.  Blood pressure well controlled at 126/78 mmHg on currently prescribed diuretic (HCTZ) and ARB (losartan ) therapies.  Patient is on atorvastatin  for his HLD diagnosis and ASCVD prevention.  Patient has a supply of short acting nitrates (NTG) to use on a as needed basis for recurrent angina/anginal equivalent symptoms. Patient is not diabetic. He does not have an OSAH diagnosis. Patient is able to complete all of his  ADL/IADLs without cardiovascular limitation.  Per the DASI, patient is able to achieve at least 4 METS of physical activity without experiencing any significant degree of angina/anginal equivalent symptoms. No changes were made to  his medication regimen during his visit with cardiology.  Patient scheduled to follow-up with outpatient cardiology in 6 months or sooner if needed.  Clement R Caraveo Jr. is scheduled for an elective XI ROBOTIC ASSISTED INGUINAL HERNIA (Right); HERNIA REPAIR UMBILICAL ADULT on 03/09/2023 with Dr. [name], MD. Given patient's past medical history significant for cardiovascular diagnoses, presurgical cardiac clearance was sought by the PAT team.  Per cardiology, he appears well compensated today and has not had any significant chest pain or dyspnea. I think it would be reasonable for him to proceed with elective hernia surgery, which is LOW TO INTERMEDIATE risk from a cardiovascular standpoint, without additional testing or intervention.  In review of the patient's chart, it is noted that he is on daily oral antithrombotic therapy. He has been instructed on recommendations for holding his clopidogrel  for 5 days prior to his procedure with plans to restart as soon as postoperative bleeding risk felt to be minimized by his attending surgeon. The patient has been instructed that his last dose of clopidogrel  should be on 03/04/2023.  In the setting of known CAD and previous PCI with stenting, cardiology has asked that patient start a daily low-dose ASA and continue it until his clopidogrel  has been restarted.  He has been updated on these recommendations by the PAT team.  Patient denies previous perioperative complications with anesthesia in the past. In review his EMR, it is noted that patient underwent a general anesthetic course here at [facility] (ASA II) in 07/2022 without documented complications.      03/08/2023   10:30 AM 03/04/2023    3:47 PM 03/01/2023    9:05 AM  Vitals with BMI  Height 5' 10 5' 10 5' 10  Weight 223 lbs 220 lbs 225 lbs  BMI 32 31.57 32.28  Systolic 124  137  Diastolic 88  87  Pulse 58  60   Providers/Specialists:  NOTE: Primary  physician provider listed below. Patient may have been seen by APP or partner within same practice.   PROVIDER ROLE / SPECIALTY LAST SHERLEAN Lane Shope, MD General Surgery (Surgeon) 03/01/2023  Wellington Curtis LABOR, FNP Primary Care Provider 02/07/2023  Mady Bruckner, MD Cardiology 02/17/2023   Allergies:  No Known Allergies Current Home Medications:   No current facility-administered medications for this encounter.    aspirin  EC 81 MG tablet   atorvastatin  (LIPITOR ) 80 MG tablet   clopidogrel  (PLAVIX ) 75 MG tablet   hydrochlorothiazide  (HYDRODIURIL ) 25 MG tablet   losartan  (COZAAR ) 50 MG tablet   nitroGLYCERIN  (NITROSTAT ) 0.4 MG SL tablet   Skin Protectants, Misc. (EUCERIN) cream   tamsulosin  (FLOMAX ) 0.4 MG CAPS capsule   History:   Past Medical History:  Diagnosis Date   Adenomatous colon polyp    Anxiety    Aortic atherosclerosis (HCC)    Apical mural thrombus 03/2016   a.) developed as result of anterior STEMI; b.) s/p 3 months of anticoagulation using warfarin   Atypical chest pain    Avascular necrosis of left femoral head (HCC)    Bilateral carotid artery disease (HCC)    a.) carotid doppler 10/13/2017: 1-39% BICA   BPH (benign prostatic hyperplasia)    Coronary artery disease    a. 03/2016: LM nl, LAD thrombotic occlusion (3.0x38 Resolute Integrity DES), D1 70 (jailed), mild to mod nonobs LCx & RCA dzs; b. MV 7/18: Mod mid/apical antsept defect, no isch, EF 45%->Med Rx; c. 09/2017 Cath: LM nl, LAD patent stent, D1 70ost (jailed), LCX 50p, OM1 100, OM2 30, RCA 30p, RPAV 50, EF 50-55%->Med Rx.   Daily consumption of alcohol    Diverticulosis    Erectile dysfunction    GERD (gastroesophageal reflux disease)    HFrEF (heart failure with reduced ejection fraction) (HCC)    a. TTE 2/18:  EF of 30-35% (in setting of MI); b. TTE 5/18: EF 35-40%; c. 10/2017 Echo: EF 40-45%, mid-apicalantsept, ant, ap sev HK, Gr1 DD. Mild BAE.   History of Clostridium difficile colitis     HOH (hard of hearing)    Hyperlipidemia    Hypertension    Incomplete right bundle branch block (RBBB)    Ischemic cardiomyopathy    a. TTE 2/18:  EF of 30-35% with LAD territory AK, likely mural thrombus at the LV apex; b. TTE 5/18: EF 35-40%, mid and apical anterior/anteroseptal AK, no evidence of LV thrombus, Gr1DD, mild BAE, normal RVSF; c. 10/2017 Echo: EF 40-45%, mid-apicalantsept, ant, ap sev HK, Gr1 DD. Mild BAE.   Marijuana user    Multiple pulmonary nodules    On long term clopidogrel  therapy    Osteoarthritis    Pulmonary emphysema (HCC)    Right hydrocele    Right inguinal hernia    Splenic laceration (grade V) 04/06/2019   a.) s/p splenectomy 04/06/2019   ST elevation myocardial infarction (STEMI) of anterior wall (HCC) 03/09/2016   a.) late presenting STEMI (symptom onset ~18 hours prior to arrival) 03/09/2016 - thrombotic occlusion p/mLAD involving D1/D2 --> IVUS-guided PCI to p/mLAD (Integrity Resolute 3.0 x 38 mm DES) with 0% residual stenosis -> D2 was jailed by the LAD stent resulting in 70% ostial stenosis with TIMI-3 flow.   Umbilical hernia    Past Surgical History:  Procedure Laterality Date   COLONOSCOPY WITH PROPOFOL  N/A 04/22/2017   Procedure:  COLONOSCOPY WITH PROPOFOL ;  Surgeon: Therisa Bi, MD;  Location: [facility] ENDOSCOPY;  Service: Gastroenterology;  Laterality: N/A;   COLONOSCOPY WITH PROPOFOL  N/A 07/13/2022   Procedure: COLONOSCOPY WITH PROPOFOL ;  Surgeon: Therisa Bi, MD;  Location[facility] ENDOSCOPY;  Service: Gastroenterology;  Laterality: N/A;   CORONARY ANGIOGRAPHY N/A 03/09/2016   Procedure: Coronary Angiography;  Surgeon: Lonni Hanson, MD;  Location: [facility] INVASIVE CV LAB;  Service: Cardiovascular;  Laterality: N/A;   CORONARY STENT INTERVENTION N/A 03/09/2016   Procedure: Coronary Stent Intervention;  Surgeon: Lonni Hanson, MD;  Location: [facility] INVASIVE CV LAB;  Service: Cardiovascular;  Laterality: N/A;   HERNIA REPAIR  2005   HYDROCELE EXCISION      LEFT HEART CATH AND CORONARY ANGIOGRAPHY Left 09/20/2017   Procedure: LEFT HEART CATH AND CORONARY ANGIOGRAPHY;  Surgeon: Hanson Lonni, MD;  Location: [facility] INVASIVE CV LAB;  Service: Cardiovascular;  Laterality: Left;   SPLENECTOMY N/A 04/06/2019   Family History  Problem Relation Age of Onset   Emphysema Mother    Macular degeneration Mother    Heart attack Mother    COPD Mother    Heart attack Father    Heart disease Father    Social History   Tobacco Use   Smoking status: Former    Current packs/day: 0.00    Average packs/day: 3.0 packs/day for 17.3 years (52.0 ttl pk-yrs)    Types: Cigarettes    Start date: 07/03/2007    Quit date: 12/12/2009    Years since quitting: 13.2    Passive exposure: Past   Smokeless tobacco: Never   Tobacco comments:    QUIT IN 2005  Substance Use Topics   Alcohol use: Yes    Alcohol/week: 9.0 standard drinks of alcohol    Types: 1 Glasses of wine, 8 Cans of beer per week    Comment: average of 2 beers per day   Pertinent Clinical Results:  LABS:  Hospital Outpatient Visit on 03/08/2023  Component Date Value Ref Range Status   WBC 03/08/2023 7.6  4.0 - 10.5 K/uL Final   RBC 03/08/2023 4.72  4.22 - 5.81 MIL/uL Final   Hemoglobin 03/08/2023 15.3  13.0 - 17.0 g/dL Final   HCT 97/95/7974 44.6  39.0 - 52.0 % Final   MCV 03/08/2023 94.5  80.0 - 100.0 fL Final   MCH 03/08/2023 32.4  26.0 - 34.0 pg Final   MCHC 03/08/2023 34.3  30.0 - 36.0 g/dL Final   RDW 97/95/7974 13.4  11.5 - 15.5 % Final   Platelets 03/08/2023 298  150 - 400 K/uL Final   nRBC 03/08/2023 0.0  0.0 - 0.2 % Final   Neutrophils Relative % 03/08/2023 54  % Final   Neutro Abs 03/08/2023 4.2  1.7 - 7.7 K/uL Final   Lymphocytes Relative 03/08/2023 31  % Final   Lymphs Abs 03/08/2023 2.3  0.7 - 4.0 K/uL Final   Monocytes Relative 03/08/2023 11  % Final   Monocytes Absolute 03/08/2023 0.8  0.1 - 1.0 K/uL Final   Eosinophils Relative 03/08/2023 3  % Final   Eosinophils  Absolute 03/08/2023 0.2  0.0 - 0.5 K/uL Final   Basophils Relative 03/08/2023 1  % Final   Basophils Absolute 03/08/2023 0.1  0.0 - 0.1 K/uL Final   Immature Granulocytes 03/08/2023 0  % Final   Abs Immature Granulocytes 03/08/2023 0.02  0.00 - 0.07 K/uL Final   Performed at [facility], [address] [place], [place] [address]   Sodium 03/08/2023 137  135 - 145 mmol/L Final   Potassium 03/08/2023 3.7  3.5 - 5.1 mmol/L Final   Chloride 03/08/2023 102  98 - 111 mmol/L Final   CO2 03/08/2023 25  22 - 32 mmol/L Final   Glucose, Bld 03/08/2023 126 (H)  70 - 99 mg/dL Final   Glucose reference range applies only to samples taken after fasting for at least 8 hours.   BUN 03/08/2023 19  8 - 23 mg/dL Final   Creatinine, Ser 03/08/2023 0.83  0.61 - 1.24 mg/dL Final   Calcium  03/08/2023 8.8 (L)  8.9 - 10.3 mg/dL Final   Total Protein 97/95/7974 7.1  6.5 - 8.1 g/dL Final   Albumin 97/95/7974 3.8  3.5 - 5.0 g/dL Final   AST 97/95/7974 20  15 - 41 U/L Final   ALT 03/08/2023 21  0 - 44 U/L Final   Alkaline Phosphatase 03/08/2023 40  38 - 126 U/L Final   Total Bilirubin 03/08/2023 0.9  0.0 - 1.2 mg/dL Final   GFR, Estimated 03/08/2023 >60  >60 mL/min Final   Comment: (NOTE) Calculated using the CKD-EPI Creatinine Equation (2021)    Anion gap 03/08/2023 10  5 - 15 Final   Performed at [facility], [address] [place], [place] [address]    ECG: Date: 02/17/2023  Time ECG obtained: 0902 AM Rate: 56 bpm Rhythm:  Sinus rhythm with first-degree AV block; IRBBB Axis (leads I and aVF): left Intervals: PR 240 ms. QRS 114 ms. QTc 436 ms. ST segment and T wave changes: No evidence of acute T wave abnormalities or significant ST segment elevation or depression. Evidence of an age undetermined inferior infarct noted.  Comparison: Similar to previous tracing obtained on 08/16/2022   IMAGING / PROCEDURES: CT ABDOMEN PELVIS W CONTRAST performed on 02/21/2023 Small fat containing  ventral, umbilical and right inguinal hernias. Right hydrocele. Colonic diverticulosis without evidence of acute diverticulitis. Avascular necrosis of the left femoral head without collapse, unchanged. Aortic atherosclerosis  CT CHEST LUNG CA SCREEN LOW DOSE W/O CM performed on 11/05/2022 Lung-RADS 2, benign appearance or behavior. Continue annual screening with low-dose chest CT without contrast in 12 months. Aortic atherosclerosis  Coronary artery calcification. Emphysema  TRANSTHORACIC ECHOCARDIOGRAM performed on 08/10/2019 Left ventricular ejection fraction, by estimation, is 55 %. The left ventricle has normal function. Unable to exclude regional wall motion abnormality. Select images with anteroseptal/septal wall hypokinesis (possible conduction abnormality/bundle branch block). Left ventricular diastolic parameters are consistent with Grade I diastolic dysfunction (impaired relaxation).  Right ventricular systolic function is normal. The right ventricular size is normal.  Left atrial size was mildly dilated.  Challenging image quality   VAS US  CAROTID performed on 10/13/2017 Velocities in the right ICA are consistent with a 1-39% stenosis. Non-hemodynamically significant plaque <50% noted in the CCA.  Velocities in the left ICA are consistent with a 1-39% stenosis. Non-hemodynamically significant plaque noted in the CCA.  Bilateral vertebral arteries demonstrate antegrade flow.  Normal flow hemodynamics were seen in bilateral subclavian arteries.   LEFT HEART CATHETERIZATION AND CORONARY ANGIOGRAPHY performed on 09/20/2017 Overall stable appearance of the coronary arteries with diffuse mild to moderate disease. Widely patent proximal/mid LAD stent.  Jailed diagonal branch has 70% ostial stenosis. Normal ventricular filling pressure. Low normal to mildly reduced ventricular systolic function with apical and apical inferior hypokinesis. Recommendations: Continue medical therapy, as  overall angiographic appearance is unchanged or slightly improved from the time of MI in 03/2016.  We will start ranolazine  500 mg  twice daily. DAPT with Aspirin  81mg  daily and Clopidogrel  75mg  daily long-term (beyond 12 months) because of Multivessel CAD and prior PCI to the proximal and mid LAD in the setting of STEMI.    MYOCARDIAL PERFUSION IMAGING STUDY (LEXISCAN ) performed on 08/28/2016 Pharmacological myocardial perfusion imaging study with no significant  ischemia Moderate sized region of fixed defect in the mid to distal anteroseptal wall and apical region Hypokinesis in the distal anteroseptal wall and apical region,  EF estimated at 45% No EKG changes concerning for ischemia at peak stress or in recovery. Low to moderate risk scan   Impression and Plan:  Edward [name]. has been referred for pre-anesthesia review and clearance prior to him undergoing the planned anesthetic and procedural courses. Available labs, pertinent testing, and imaging results were personally reviewed by me in preparation for upcoming operative/procedural course. [facility] medical record has been updated following extensive record review and patient interview with PAT staff.   This patient has been appropriately cleared by cardiology with an overall LOW-INTERMEDIATE risk of experiencing significant perioperative cardiovascular complications. Based on clinical review performed today (03/08/23), barring any significant acute changes in the patient's overall condition, it is anticipated that he will be able to proceed with the planned surgical intervention. Any acute changes in clinical condition may necessitate his procedure being postponed and/or cancelled. Patient will meet with anesthesia team (MD and/or CRNA) on the day of his procedure for preoperative evaluation/assessment. Questions regarding anesthetic course will be fielded at that time.   Pre-surgical instructions were reviewed with the patient during his  PAT appointment, and questions were fielded to satisfaction by PAT clinical staff. He has been instructed on which medications that he will need to hold prior to surgery, as well as the ones that have been deemed safe/appropriate to take on the day of his procedure. As part of the general education provided by PAT, patient made aware both verbally and in writing, that he would need to abstain from the use of any illegal substances during his perioperative course. He was advised that failure to follow the provided instructions could necessitate case cancellation or result in serious perioperative complications up to and including death. Patient encouraged to contact PAT and/or his surgeon's office to discuss any questions or concerns that may arise prior to surgery; verbalized understanding.   Dorise Pereyra, MSN, APRN, FNP-C, CEN [facility]  Perioperative Services Nurse Practitioner Phone: [phone] Fax: [phone] 03/08/23 10:56 AM  NOTE: This note has been prepared using Dragon dictation software. Despite my best ability to proofread, there is always the potential that unintentional transcriptional errors may still occur from this process.

## 2023-03-08 NOTE — Assessment & Plan Note (Signed)
 Last A1c was 5.8, indicating prediabetes.  Patient admits to poor dietary habits recently. - Check A1c and lipids today. - Encourage patient to maintain a healthy diet and regular exercise. - Decrease starches, processed foods, saturated fats - Increase water intake, fruits, veggies

## 2023-03-08 NOTE — Assessment & Plan Note (Addendum)
Pt scheduled for x3 hernia repair with General Surgery on 03/09/23.

## 2023-03-08 NOTE — Progress Notes (Signed)
 Established Patient Office Visit  Introduced to nurse practitioner role and practice setting.  All questions answered.  Discussed provider/patient relationship and expectations.   Subjective   Patient ID: Edward JONELLE Nettie Mickey., male    DOB: [birth year]  Age: 73 y.o. MRN: [id number]  Chief Complaint  Patient presents with   Follow-up    F/u for scortal pain and needs some medication refills    Edward R Piatkowski Jr. is a 73 year old male who presents for a general follow-up.SABRA  He is scheduled for hernia repair surgery tomorrow to address both inguinal and umbilical hernias. He anticipates same-day discharge with his wife accompanying him for 24 hours post-surgery.  He has a history of prediabetes with a recent A1c of 5.8. He has experienced weight fluctuations and dietary indulgences over recent months, expressing concern about potential progression to diabetes.  His blood pressure readings indicate elevated diastolic pressure. He is currently on hydrochlorothiazide  25mg  daily and losartan  50mg  daily, having recently discontinued Coreg  due to low heart rate. He monitors his blood pressure at home, aiming for 120/80, but recently noted a readings in 130s. No chest pain, shortness of breath, or palpitations.  Hx of diverticula on CT - He manages symptoms with fiber and Miralax , diet control. No acute symptoms are reported currently.  He mentions a hx hydrocele which was repaired in past. He notes minor R enlargement but no significant symptoms. He plans to discuss this further with his urologist in an upcoming appointment in July - no concerns about it today.  He is generally up to date with medications and health maintenance. He has been without Flomax  for some time and wants to resume it, noting some difficulty without it. Has nitroglycerin  tabs as needed for chest pain, though he reports none recent episodes.        03/08/2023   10:36 AM 02/07/2023   10:22 AM 08/12/2022    8:57 AM  Depression  screen PHQ 2/9  Decreased Interest 0 0 0  Down, Depressed, Hopeless 0 0 0  PHQ - 2 Score 0 0 0  Altered sleeping 0 0   Tired, decreased energy 0 1   Change in appetite 0 0   Feeling bad or failure about yourself  0 0   Trouble concentrating 0 0   Moving slowly or fidgety/restless 0 0   Suicidal thoughts 0 0   PHQ-9 Score 0 1   Difficult doing work/chores Not difficult at all Not difficult at all        02/24/2018    8:20 AM  GAD 7 : Generalized Anxiety Score  Nervous, Anxious, on Edge 1  Control/stop worrying 1  Worry too much - different things 1  Trouble relaxing 0  Restless 1  Easily annoyed or irritable 0  Afraid - awful might happen 0  Total GAD 7 Score 4  Anxiety Difficulty Not difficult at all   Review of Systems  All other systems reviewed and are negative.   Negative unless indicated in HPI   Objective:     BP 124/88 (BP Location: Left Arm, Patient Position: Sitting)   Pulse (!) 58   Temp 98 F (36.7 C)   Resp 16   Ht 5' 10 (1.778 m)   Wt 223 lb (101.2 kg)   SpO2 96%   BMI 32.00 kg/m    Physical Exam Constitutional:      General: He is not in acute distress.    Appearance: Normal appearance. He is  not ill-appearing, toxic-appearing or diaphoretic.  HENT:     Head: Normocephalic.     Nose: Nose normal.     Mouth/Throat:     Mouth: Mucous membranes are moist.  Eyes:     Extraocular Movements: Extraocular movements intact.     Conjunctiva/sclera: Conjunctivae normal.     Pupils: Pupils are equal, round, and reactive to light.  Cardiovascular:     Rate and Rhythm: Normal rate and regular rhythm.     Heart sounds: No murmur heard.    No friction rub. No gallop.  Pulmonary:     Effort: Pulmonary effort is normal. No respiratory distress.     Breath sounds: Normal breath sounds. No stridor. No wheezing, rhonchi or rales.  Chest:     Chest wall: No tenderness.  Abdominal:     Tenderness: There is no right CVA tenderness or left CVA tenderness.   Musculoskeletal:     Right lower leg: No edema.     Left lower leg: No edema.  Skin:    General: Skin is warm and dry.     Capillary Refill: Capillary refill takes less than 2 seconds.  Neurological:     General: No focal deficit present.     Mental Status: He is alert and oriented to person, place, and time. Mental status is at baseline.     Cranial Nerves: No cranial nerve deficit.     Sensory: No sensory deficit.     Motor: No weakness.     Coordination: Coordination normal.     Gait: Gait normal.  Psychiatric:        Mood and Affect: Mood normal.        Behavior: Behavior normal.        Thought Content: Thought content normal.        Judgment: Judgment normal.    Results for orders placed or performed during the hospital encounter of 03/08/23  CBC with Differential/Platelet  Result Value Ref Range   WBC 7.6 4.0 - 10.5 K/uL   RBC 4.72 4.22 - 5.81 MIL/uL   Hemoglobin 15.3 13.0 - 17.0 g/dL   HCT 55.3 60.9 - 47.9 %   MCV 94.5 80.0 - 100.0 fL   MCH 32.4 26.0 - 34.0 pg   MCHC 34.3 30.0 - 36.0 g/dL   RDW 86.5 88.4 - 84.4 %   Platelets 298 150 - 400 K/uL   nRBC 0.0 0.0 - 0.2 %   Neutrophils Relative % 54 %   Neutro Abs 4.2 1.7 - 7.7 K/uL   Lymphocytes Relative 31 %   Lymphs Abs 2.3 0.7 - 4.0 K/uL   Monocytes Relative 11 %   Monocytes Absolute 0.8 0.1 - 1.0 K/uL   Eosinophils Relative 3 %   Eosinophils Absolute 0.2 0.0 - 0.5 K/uL   Basophils Relative 1 %   Basophils Absolute 0.1 0.0 - 0.1 K/uL   Immature Granulocytes 0 %   Abs Immature Granulocytes 0.02 0.00 - 0.07 K/uL  Comprehensive metabolic panel  Result Value Ref Range   Sodium 137 135 - 145 mmol/L   Potassium 3.7 3.5 - 5.1 mmol/L   Chloride 102 98 - 111 mmol/L   CO2 25 22 - 32 mmol/L   Glucose, Bld 126 (H) 70 - 99 mg/dL   BUN 19 8 - 23 mg/dL   Creatinine, Ser 9.16 0.61 - 1.24 mg/dL   Calcium  8.8 (L) 8.9 - 10.3 mg/dL   Total Protein 7.1 6.5 - 8.1 g/dL   Albumin  3.8 3.5 - 5.0 g/dL   AST 20 15 - 41 U/L   ALT  21 0 - 44 U/L   Alkaline Phosphatase 40 38 - 126 U/L   Total Bilirubin 0.9 0.0 - 1.2 mg/dL   GFR, Estimated >39 >39 mL/min   Anion gap 10 5 - 15      The ASCVD Risk score (Arnett DK, et al., 2019) failed to calculate for the following reasons:   The valid total cholesterol range is 130 to 320 mg/dL    Assessment & Plan:  Prediabetes Assessment & Plan: Last A1c was 5.8, indicating prediabetes.  Patient admits to poor dietary habits recently. - Check A1c and lipids today. - Encourage patient to maintain a healthy diet and regular exercise. - Decrease starches, processed foods, saturated fats - Increase water intake, fruits, veggies  Orders: -     Hemoglobin A1c  Coronary artery disease involving native coronary artery of native heart without angina pectoris Assessment & Plan: Managed w/ lipitor  80 mg daily Will check fasting lipids    Orders: -     Lipid panel  Essential hypertension Assessment & Plan: Blood pressure slightly elevated today, with diastolic reading above target = 124/88 Goal <130/80 Continue Losartan  50 mg daily and hydrochlorothiazide  25 mg daily - Monitor blood pressure at home with upper arm cuff, L arm. - Consider increasing losartan  dose if readings consistently above target. -low sodium diet, exercise, weight loss F/u 6 months, pt agreeable to monitoring - if elevated at home plans to schedule sooner   Difficulty urinating Assessment & Plan: Pt trialed off of flomax  for a couple months, but increased hesitancy and stream. Would like to restart flomax  as he appreciated the benefits with improved urination process. Flomax  0.4mg  daily Pt follows with urology   Orders: -     Tamsulosin  HCl; Take 1 capsule (0.4 mg total) by mouth daily.  Dispense: 90 capsule; Refill: 1  Incisional hernia, without obstruction or gangrene Assessment & Plan: Pt scheduled for x3 hernia repair with General Surgery on 03/09/23.     Will communicate lab  results.  I, Curtis DELENA Boom, FNP, have reviewed all documentation for this visit. The documentation on 03/08/23 for the exam, diagnosis, procedures, and orders are all accurate and complete.   Return in about 6 months (around 09/05/2023) for BP and A1C check.   Curtis DELENA Boom, FNP

## 2023-03-08 NOTE — Assessment & Plan Note (Signed)
 Blood pressure slightly elevated today, with diastolic reading above target = 124/88 Goal <130/80 Continue Losartan  50 mg daily and hydrochlorothiazide  25 mg daily - Monitor blood pressure at home with upper arm cuff, L arm. - Consider increasing losartan  dose if readings consistently above target. -low sodium diet, exercise, weight loss F/u 6 months, pt agreeable to monitoring - if elevated at home plans to schedule sooner

## 2023-03-08 NOTE — Assessment & Plan Note (Signed)
Pt trialed off of flomax for a couple months, but increased hesitancy and stream. Would like to restart flomax as he appreciated the benefits with improved urination process. Flomax 0.4mg  daily Pt follows with urology

## 2023-03-08 NOTE — Assessment & Plan Note (Signed)
Managed w/ lipitor 80 mg daily Will check fasting lipids

## 2023-03-09 ENCOUNTER — Other Ambulatory Visit: Payer: Self-pay

## 2023-03-09 ENCOUNTER — Encounter: Payer: Self-pay | Admitting: Family Medicine

## 2023-03-09 ENCOUNTER — Ambulatory Visit: Payer: Self-pay | Admitting: Urgent Care

## 2023-03-09 ENCOUNTER — Encounter
Admission: RE | Disposition: A | Discharge status: Home / Self Care | Payer: Self-pay | Source: Home / Self Care | Attending: Surgery

## 2023-03-09 ENCOUNTER — Encounter: Payer: Self-pay | Admitting: Surgery

## 2023-03-09 ENCOUNTER — Ambulatory Visit
Admission: RE | Admit: 2023-03-09 | Discharge: 2023-03-09 | Disposition: A | Discharge status: Home / Self Care | Payer: Medicare Other | Attending: Surgery | Admitting: Surgery

## 2023-03-09 DIAGNOSIS — Z7902 Long term (current) use of antithrombotics/antiplatelets: Secondary | ICD-10-CM | POA: Insufficient documentation

## 2023-03-09 DIAGNOSIS — I110 Hypertensive heart disease with heart failure: Secondary | ICD-10-CM | POA: Diagnosis not present

## 2023-03-09 DIAGNOSIS — K219 Gastro-esophageal reflux disease without esophagitis: Secondary | ICD-10-CM | POA: Insufficient documentation

## 2023-03-09 DIAGNOSIS — Z955 Presence of coronary angioplasty implant and graft: Secondary | ICD-10-CM | POA: Diagnosis not present

## 2023-03-09 DIAGNOSIS — F419 Anxiety disorder, unspecified: Secondary | ICD-10-CM | POA: Insufficient documentation

## 2023-03-09 DIAGNOSIS — I5022 Chronic systolic (congestive) heart failure: Secondary | ICD-10-CM | POA: Diagnosis not present

## 2023-03-09 DIAGNOSIS — K429 Umbilical hernia without obstruction or gangrene: Secondary | ICD-10-CM | POA: Insufficient documentation

## 2023-03-09 DIAGNOSIS — Z79899 Other long term (current) drug therapy: Secondary | ICD-10-CM | POA: Insufficient documentation

## 2023-03-09 DIAGNOSIS — I252 Old myocardial infarction: Secondary | ICD-10-CM | POA: Diagnosis not present

## 2023-03-09 DIAGNOSIS — I10 Essential (primary) hypertension: Secondary | ICD-10-CM | POA: Diagnosis not present

## 2023-03-09 DIAGNOSIS — K430 Incisional hernia with obstruction, without gangrene: Secondary | ICD-10-CM | POA: Diagnosis not present

## 2023-03-09 DIAGNOSIS — K4090 Unilateral inguinal hernia, without obstruction or gangrene, not specified as recurrent: Secondary | ICD-10-CM | POA: Diagnosis not present

## 2023-03-09 DIAGNOSIS — E782 Mixed hyperlipidemia: Secondary | ICD-10-CM

## 2023-03-09 DIAGNOSIS — K432 Incisional hernia without obstruction or gangrene: Secondary | ICD-10-CM

## 2023-03-09 DIAGNOSIS — I255 Ischemic cardiomyopathy: Secondary | ICD-10-CM | POA: Diagnosis not present

## 2023-03-09 DIAGNOSIS — I700 Atherosclerosis of aorta: Secondary | ICD-10-CM | POA: Insufficient documentation

## 2023-03-09 DIAGNOSIS — I509 Heart failure, unspecified: Secondary | ICD-10-CM | POA: Diagnosis not present

## 2023-03-09 DIAGNOSIS — Z87891 Personal history of nicotine dependence: Secondary | ICD-10-CM | POA: Insufficient documentation

## 2023-03-09 DIAGNOSIS — I2510 Atherosclerotic heart disease of native coronary artery without angina pectoris: Secondary | ICD-10-CM | POA: Insufficient documentation

## 2023-03-09 DIAGNOSIS — K5730 Diverticulosis of large intestine without perforation or abscess without bleeding: Secondary | ICD-10-CM | POA: Diagnosis not present

## 2023-03-09 DIAGNOSIS — R7303 Prediabetes: Secondary | ICD-10-CM

## 2023-03-09 HISTORY — DX: Other nonspecific abnormal finding of lung field: R91.8

## 2023-03-09 HISTORY — DX: Benign neoplasm of colon, unspecified: D12.6

## 2023-03-09 HISTORY — DX: Long term (current) use of anticoagulants: Z79.01

## 2023-03-09 HISTORY — DX: Diverticulosis of intestine, part unspecified, without perforation or abscess without bleeding: K57.90

## 2023-03-09 HISTORY — DX: Polyp of colon: K63.5

## 2023-03-09 HISTORY — DX: Other chest pain: R07.89

## 2023-03-09 HISTORY — DX: Idiopathic aseptic necrosis of left femur: M87.052

## 2023-03-09 HISTORY — DX: Atherosclerosis of aorta: I70.0

## 2023-03-09 HISTORY — DX: Emphysema, unspecified: J43.9

## 2023-03-09 HISTORY — DX: Unspecified right bundle-branch block: I45.10

## 2023-03-09 HISTORY — DX: Unspecified hearing loss, unspecified ear: H91.90

## 2023-03-09 HISTORY — DX: Other specified health status: Z78.9

## 2023-03-09 HISTORY — DX: Hydrocele, unspecified: N43.3

## 2023-03-09 HISTORY — PX: UMBILICAL HERNIA REPAIR: SHX196

## 2023-03-09 HISTORY — DX: Unilateral inguinal hernia, without obstruction or gangrene, not specified as recurrent: K40.90

## 2023-03-09 HISTORY — DX: Cannabis use, unspecified, uncomplicated: F12.90

## 2023-03-09 HISTORY — DX: Disorder of arteries and arterioles, unspecified: I77.9

## 2023-03-09 HISTORY — DX: Personal history of other infectious and parasitic diseases: Z86.19

## 2023-03-09 HISTORY — DX: Male erectile dysfunction, unspecified: N52.9

## 2023-03-09 HISTORY — DX: Unspecified osteoarthritis, unspecified site: M19.90

## 2023-03-09 HISTORY — DX: Umbilical hernia without obstruction or gangrene: K42.9

## 2023-03-09 LAB — HEMOGLOBIN A1C
Est. average glucose Bld gHb Est-mCnc: 134 mg/dL
Hgb A1c MFr Bld: 6.2 % — ABNORMAL HIGH (ref 4.8–5.6)
Hgb A1c MFr Bld: 6.3 % — ABNORMAL HIGH (ref 4.8–5.6)
Mean Plasma Glucose: 131.24 mg/dL

## 2023-03-09 LAB — LIPID PANEL
Chol/HDL Ratio: 3.2 {ratio} (ref 0.0–5.0)
Cholesterol, Total: 127 mg/dL (ref 100–199)
HDL: 40 mg/dL (ref >39)
LDL Chol Calc (NIH): 68 mg/dL (ref 0–99)
Triglycerides: 98 mg/dL (ref 0–149)
VLDL Cholesterol Cal: 19 mg/dL (ref 5–40)

## 2023-03-09 SURGERY — HERNIORRHAPHY, INGUINAL, ROBOT-ASSISTED, LAPAROSCOPIC
Anesthesia: General | Laterality: Right

## 2023-03-09 MED ORDER — CHLORHEXIDINE GLUCONATE 0.12 % MT SOLN
OROMUCOSAL | Status: AC
  Filled 2023-03-09: qty 15

## 2023-03-09 MED ORDER — BUPIVACAINE-EPINEPHRINE 0.25% -1:200000 IJ SOLN
INTRAMUSCULAR | Status: DC | PRN
  Administered 2023-03-09: 30 mL

## 2023-03-09 MED ORDER — BUPIVACAINE-EPINEPHRINE (PF) 0.25% -1:200000 IJ SOLN
INTRAMUSCULAR | Status: AC
  Filled 2023-03-09: qty 30

## 2023-03-09 MED ORDER — CHLORHEXIDINE GLUCONATE CLOTH 2 % EX PADS
6.0000 | MEDICATED_PAD | Freq: Once | CUTANEOUS | Status: AC
  Administered 2023-03-09: 6 via TOPICAL

## 2023-03-09 MED ORDER — IBUPROFEN 800 MG PO TABS: 800.0000 mg | ORAL_TABLET | Freq: Three times a day (TID) | ORAL | 0 refills | Status: DC | PRN

## 2023-03-09 MED ORDER — ACETAMINOPHEN 500 MG PO TABS
ORAL_TABLET | ORAL | Status: AC
  Filled 2023-03-09: qty 2

## 2023-03-09 MED ORDER — OXYCODONE HCL 5 MG/5ML PO SOLN: 5.0000 mg | Freq: Once | ORAL | Status: AC | PRN

## 2023-03-09 MED ORDER — GABAPENTIN 300 MG PO CAPS
ORAL_CAPSULE | ORAL | Status: AC
  Filled 2023-03-09: qty 1

## 2023-03-09 MED ORDER — FENTANYL CITRATE (PF) 100 MCG/2ML IJ SOLN
INTRAMUSCULAR | Status: AC
  Filled 2023-03-09: qty 2

## 2023-03-09 MED ORDER — GLYCOPYRROLATE 0.2 MG/ML IJ SOLN
INTRAMUSCULAR | Status: DC | PRN
  Administered 2023-03-09: .4 mg via INTRAVENOUS

## 2023-03-09 MED ORDER — FENTANYL CITRATE (PF) 100 MCG/2ML IJ SOLN: 25.0000 ug | INTRAMUSCULAR | Status: DC | PRN

## 2023-03-09 MED ORDER — CEFAZOLIN SODIUM-DEXTROSE 2-4 GM/100ML-% IV SOLN
2.0000 g | INTRAVENOUS | Status: AC
  Administered 2023-03-09: 2 g via INTRAVENOUS

## 2023-03-09 MED ORDER — GABAPENTIN 300 MG PO CAPS
300.0000 mg | ORAL_CAPSULE | ORAL | Status: AC
  Administered 2023-03-09: 300 mg via ORAL

## 2023-03-09 MED ORDER — PROPOFOL 10 MG/ML IV BOLUS
INTRAVENOUS | Status: DC | PRN
  Administered 2023-03-09: 160 mg via INTRAVENOUS

## 2023-03-09 MED ORDER — NEOSTIGMINE METHYLSULFATE 10 MG/10ML IV SOLN
INTRAVENOUS | Status: DC | PRN
  Administered 2023-03-09: 3 mg via INTRAVENOUS

## 2023-03-09 MED ORDER — CELECOXIB 200 MG PO CAPS
ORAL_CAPSULE | ORAL | Status: AC
  Filled 2023-03-09: qty 1

## 2023-03-09 MED ORDER — BUPIVACAINE LIPOSOME 1.3 % IJ SUSP: 20.0000 mL | Freq: Once | INTRAMUSCULAR | Status: DC

## 2023-03-09 MED ORDER — DEXAMETHASONE SODIUM PHOSPHATE 10 MG/ML IJ SOLN
INTRAMUSCULAR | Status: DC | PRN
  Administered 2023-03-09: 5 mg via INTRAVENOUS

## 2023-03-09 MED ORDER — ORAL CARE MOUTH RINSE: 15.0000 mL | Freq: Once | OROMUCOSAL | Status: AC

## 2023-03-09 MED ORDER — OXYCODONE HCL 5 MG PO TABS
5.0000 mg | ORAL_TABLET | Freq: Once | ORAL | Status: AC | PRN
  Administered 2023-03-09: 5 mg via ORAL

## 2023-03-09 MED ORDER — BUPIVACAINE LIPOSOME 1.3 % IJ SUSP
INTRAMUSCULAR | Status: DC | PRN
  Administered 2023-03-09: 20 mL

## 2023-03-09 MED ORDER — ONDANSETRON HCL 4 MG/2ML IJ SOLN
INTRAMUSCULAR | Status: DC | PRN
  Administered 2023-03-09: 4 mg via INTRAVENOUS

## 2023-03-09 MED ORDER — CHLORHEXIDINE GLUCONATE CLOTH 2 % EX PADS: 6.0000 | MEDICATED_PAD | Freq: Once | CUTANEOUS | Status: DC

## 2023-03-09 MED ORDER — OXYCODONE HCL 5 MG PO TABS
ORAL_TABLET | ORAL | Status: AC
  Filled 2023-03-09: qty 1

## 2023-03-09 MED ORDER — ROCURONIUM BROMIDE 100 MG/10ML IV SOLN
INTRAVENOUS | Status: DC | PRN
  Administered 2023-03-09: 50 mg via INTRAVENOUS
  Administered 2023-03-09: 25 mg via INTRAVENOUS

## 2023-03-09 MED ORDER — EPHEDRINE SULFATE-NACL 50-0.9 MG/10ML-% IV SOSY
PREFILLED_SYRINGE | INTRAVENOUS | Status: DC | PRN
  Administered 2023-03-09 (×2): 5 mg via INTRAVENOUS
  Administered 2023-03-09: 10 mg via INTRAVENOUS

## 2023-03-09 MED ORDER — NEOSTIGMINE METHYLSULFATE 10 MG/10ML IV SOLN
INTRAVENOUS | Status: AC
  Filled 2023-03-09: qty 1

## 2023-03-09 MED ORDER — LIDOCAINE HCL (CARDIAC) PF 100 MG/5ML IV SOSY
PREFILLED_SYRINGE | INTRAVENOUS | Status: DC | PRN
  Administered 2023-03-09: 60 mg via INTRAVENOUS

## 2023-03-09 MED ORDER — ACETAMINOPHEN 500 MG PO TABS
1000.0000 mg | ORAL_TABLET | ORAL | Status: AC
  Administered 2023-03-09: 1000 mg via ORAL

## 2023-03-09 MED ORDER — CHLORHEXIDINE GLUCONATE 0.12 % MT SOLN
15.0000 mL | Freq: Once | OROMUCOSAL | Status: AC
  Administered 2023-03-09: 15 mL via OROMUCOSAL

## 2023-03-09 MED ORDER — 0.9 % SODIUM CHLORIDE (POUR BTL) OPTIME
TOPICAL | Status: DC | PRN
  Administered 2023-03-09: 500 mL

## 2023-03-09 MED ORDER — CELECOXIB 200 MG PO CAPS
200.0000 mg | ORAL_CAPSULE | ORAL | Status: AC
  Administered 2023-03-09: 200 mg via ORAL

## 2023-03-09 MED ORDER — CEFAZOLIN SODIUM-DEXTROSE 2-4 GM/100ML-% IV SOLN
INTRAVENOUS | Status: AC
  Filled 2023-03-09: qty 100

## 2023-03-09 MED ORDER — PROPOFOL 10 MG/ML IV BOLUS
INTRAVENOUS | Status: AC
  Filled 2023-03-09: qty 20

## 2023-03-09 MED ORDER — GLYCOPYRROLATE 0.2 MG/ML IJ SOLN
INTRAMUSCULAR | Status: AC
  Filled 2023-03-09: qty 2

## 2023-03-09 MED ORDER — LACTATED RINGERS IV SOLN: INTRAVENOUS | Status: DC

## 2023-03-09 MED ORDER — FENTANYL CITRATE (PF) 100 MCG/2ML IJ SOLN
INTRAMUSCULAR | Status: DC | PRN
  Administered 2023-03-09 (×2): 50 ug via INTRAVENOUS

## 2023-03-09 MED ORDER — HYDROCODONE-ACETAMINOPHEN 5-325 MG PO TABS: 1.0000 | ORAL_TABLET | Freq: Four times a day (QID) | ORAL | 0 refills | Status: DC | PRN

## 2023-03-09 SURGICAL SUPPLY — 57 items
BLADE CLIPPER SURG (BLADE) ×2 IMPLANT
BLADE SURG 15 STRL LF DISP TIS (BLADE) ×2 IMPLANT
CHLORAPREP W/TINT 26 (MISCELLANEOUS) ×2 IMPLANT
COVER MAYO STAND STRL (DRAPES) IMPLANT
COVER TIP SHEARS 8 DVNC (MISCELLANEOUS) ×2 IMPLANT
COVER WAND RF STERILE (DRAPES) ×2 IMPLANT
DERMABOND ADVANCED .7 DNX12 (GAUZE/BANDAGES/DRESSINGS) ×2 IMPLANT
DRAPE ARM DVNC X/XI (DISPOSABLE) ×6 IMPLANT
DRAPE COLUMN DVNC XI (DISPOSABLE) ×2 IMPLANT
DRAPE LAPAROTOMY 77X122 PED (DRAPES) ×2 IMPLANT
DRAPE UTILITY 15X26 TOWEL STRL (DRAPES) ×2 IMPLANT
ELECT CAUTERY BLADE 6.4 (BLADE) ×2 IMPLANT
ELECT REM PT RETURN 9FT ADLT (ELECTROSURGICAL) ×2
ELECTRODE REM PT RTRN 9FT ADLT (ELECTROSURGICAL) ×2 IMPLANT
FORCEPS BPLR R/ABLATION 8 DVNC (INSTRUMENTS) ×2 IMPLANT
GAUZE 4X4 16PLY ~~LOC~~+RFID DBL (SPONGE) ×2 IMPLANT
GLOVE ORTHO TXT STRL SZ7.5 (GLOVE) ×6 IMPLANT
GOWN STRL REUS W/ TWL LRG LVL3 (GOWN DISPOSABLE) ×2 IMPLANT
GOWN STRL REUS W/ TWL XL LVL3 (GOWN DISPOSABLE) ×4 IMPLANT
GRASPER SUT TROCAR 14GX15 (MISCELLANEOUS) IMPLANT
IRRIGATION STRYKERFLOW (MISCELLANEOUS) IMPLANT
IRRIGATOR STRYKERFLOW (MISCELLANEOUS)
IV NS 1000ML BAXH (IV SOLUTION) IMPLANT
KIT PINK PAD W/HEAD ARE REST (MISCELLANEOUS) ×2 IMPLANT
KIT PINK PAD W/HEAD ARM REST (MISCELLANEOUS) ×2 IMPLANT
KIT TURNOVER KIT A (KITS) ×2 IMPLANT
LABEL OR SOLS (LABEL) ×2 IMPLANT
MANIFOLD NEPTUNE II (INSTRUMENTS) ×2 IMPLANT
MESH 3DMAX LIGHT 4.8X6.7 RT XL (Mesh General) IMPLANT
NDL DRIVE SUT CUT DVNC (INSTRUMENTS) ×2 IMPLANT
NDL HYPO 22X1.5 SAFETY MO (MISCELLANEOUS) ×2 IMPLANT
NDL INSUFFLATION 14GA 120MM (NEEDLE) IMPLANT
NEEDLE DRIVE SUT CUT DVNC (INSTRUMENTS) ×2 IMPLANT
NEEDLE HYPO 22X1.5 SAFETY MO (MISCELLANEOUS) ×2 IMPLANT
NEEDLE INSUFFLATION 14GA 120MM (NEEDLE) IMPLANT
NS IRRIG 500ML POUR BTL (IV SOLUTION) ×2 IMPLANT
PACK BASIN MINOR ARMC (MISCELLANEOUS) ×2 IMPLANT
PACK LAP CHOLECYSTECTOMY (MISCELLANEOUS) ×2 IMPLANT
SCISSORS MNPLR CVD DVNC XI (INSTRUMENTS) ×2 IMPLANT
SEAL UNIV 5-12 XI (MISCELLANEOUS) ×6 IMPLANT
SET TUBE SMOKE EVAC HIGH FLOW (TUBING) ×2 IMPLANT
SOL ELECTROSURG ANTI STICK (MISCELLANEOUS) ×2
SOLUTION ELECTROSURG ANTI STCK (MISCELLANEOUS) ×2 IMPLANT
SPIKE FLUID TRANSFER (MISCELLANEOUS) ×2 IMPLANT
SUT ETHIBOND 0 MO6 C/R (SUTURE) ×2 IMPLANT
SUT MNCRL 4-0 27 PS-2 XMFL (SUTURE) ×2
SUT STRATA 2-0 23CM CT-2 (SUTURE) IMPLANT
SUT STRATA 3-0 SH (SUTURE) IMPLANT
SUT VIC AB 2-0 SH 27XBRD (SUTURE) ×2 IMPLANT
SUT VIC AB 3-0 SH 27X BRD (SUTURE) ×2 IMPLANT
SUT VICRYL 0 UR6 27IN ABS (SUTURE) IMPLANT
SUTURE MNCRL 4-0 27XMF (SUTURE) ×2 IMPLANT
SYR 10ML LL (SYRINGE) ×2 IMPLANT
TAPE TRANSPORE STRL 2 31045 (GAUZE/BANDAGES/DRESSINGS) IMPLANT
TRAP FLUID SMOKE EVACUATOR (MISCELLANEOUS) ×2 IMPLANT
TROCAR Z-THREAD FIOS 11X100 BL (TROCAR) IMPLANT
WATER STERILE IRR 500ML POUR (IV SOLUTION) ×2 IMPLANT

## 2023-03-09 NOTE — Anesthesia Postprocedure Evaluation (Signed)
 Anesthesia Post Note  Patient: Edward R Isenhower Jr.  Procedure(s) Performed: XI ROBOTIC ASSISTED INGUINAL HERNIA (Right) HERNIA REPAIR UMBILICAL ADULT  Patient location during evaluation: PACU Anesthesia Type: General Level of consciousness: awake and alert Pain management: pain level controlled Vital Signs Assessment: post-procedure vital signs reviewed and stable Respiratory status: spontaneous breathing, nonlabored ventilation, respiratory function stable and patient connected to nasal cannula oxygen Cardiovascular status: blood pressure returned to baseline and stable Postop Assessment: no apparent nausea or vomiting Anesthetic complications: no  No notable events documented.   Last Vitals:  Vitals:   03/09/23 1406 03/09/23 1415  BP: 131/76 135/75  Pulse: 61 63  Resp: 13 17  Temp:    SpO2: 96% 96%    Last Pain:  Vitals:   03/09/23 1415  TempSrc:   PainSc: 0-No pain                 Debby Mines

## 2023-03-09 NOTE — Op Note (Addendum)
 Robotic assisted Laparoscopic Transabdominal Right Inguinal Hernia Repair with Mesh.  Open repair of incarcerated incisional hernia, <3 cm fascial defect.        Pre-operative Diagnosis:  Right  Inguinal Hernia, Incisional hernia, incarcerated.    Post-operative Diagnosis: Same   Procedure: Robotic assisted Laparoscopic  repair of right inguinal hernia(s), open repair of incarcerated incisional hernia, less than 3 cm fascial defect.   Surgeon: [name], M.D., FACS   Anesthesia: GETA   Findings: Right fat filled  inguinal hernia, no  evidence of left sided hernia.  Falciform and adipose incarceration into 2+ cm anterior abd wall fascial defect, supra-umbilical.     Procedure Details  The patient was seen again in the Holding Room. The benefits, complications, treatment options, and expected outcomes were discussed with the patient. The risks of bleeding, infection, recurrence of symptoms, failure to resolve symptoms, recurrence of hernia, ischemic orchitis, chronic pain syndrome or neuroma, were reviewed again. The likelihood of improving the patient's symptoms with return to their baseline status is good.  The patient and/or family concurred with the proposed plan, giving informed consent.  The patient was taken to Operating Room, identified  and the procedure verified as Laparoscopic Inguinal Hernia Repair. Laterality confirmed.  A Time Out was held and the above information confirmed.   Prior to the induction of general anesthesia, antibiotic prophylaxis was administered. VTE prophylaxis was in place. General endotracheal anesthesia was then administered and tolerated well. After the induction, the abdomen was prepped with Chloraprep and draped in the sterile fashion. The patient was positioned in the supine position.   After local infiltration of quarter percent Marcaine  with epinephrine , stab incision was made left upper quadrant.  On the left at Palmer's point, the Veress needle is  passed with sensation of the layers to penetrate the abdominal wall and into the peritoneum.  Saline drop test is confirmed peritoneal placement.  Insufflation is initiated with carbon dioxide to pressures of 15 mmHg. An 11 Applied visiport is placed to the left subcostal site, under direct visualization.  Pneumoperitoneum maintained w/o HD changes  to pressures of 15 mm Hg with CO2. No evidence of bowel injuries.  Two 8.5 mm ports placed under direct vision in the epigastrium and right upper quadrant. The laparoscopy revealed a right tiny indirect defect(s), and midline scarring with incarcerated falciform ligament.   The robot was brought to the table and docked in the standard fashion, no collision between arms was observed. Instruments were kept under direct view at all times. Anterior wall adhesions were lysed. And the ventral hernia reduced, the defect is small and would not warrant mesh.  For Right inguinal hernia repair,  I developed a peritoneal flap. The sac(s) were reduced and dissected free from adjacent structures. We preserved the vas and the vessels, and visualized them to their convergence and beyond in the retroperitoneum. Once dissection was completed a right sided extra large  BARD 3D Light mesh was placed and secured at three points with interrupted 2-0 Vicryl to the pubic tubercle and anteriorly. There was good coverage of the direct, indirect and femoral spaces.  Second look revealed no complications or injuries.  The flap was then closed with 3-0 V-lock suture.  Peritoneal closure without defects.  Once assuring that hemostasis was adequate, all needles/sponges removed, and the robot was undocked.  A midline incision is made over the defect, and the hernia sac was excised exposing a 2 cm defect.  The fascial defect was then closed transversely  with 3 interrupted sutures of 0 Ethibond, completely obliterating the defect.  Soft tissues were then reapproximated with 3-0 Vicryl.  This  was evaluated internally well we still had pneumoperitoneum.  In the repair looked good. Under direct visualization I placed the Veress needle into the preperitoneal space the Veress' valve was released allowing extraperitoneal CO2 to escape, it was also used to access the space for supplemental local anesthesia. The ports were removed, the abdomen desulflated.  4-0 subcuticular Monocryl was used at all skin edges. Dermabond was placed.  Patient tolerated the procedure well. There were no complications. He was taken to the recovery room in stable condition.           [name], M.D., FACS 03/09/2023, 2:19 PM

## 2023-03-09 NOTE — Anesthesia Preprocedure Evaluation (Signed)
Anesthesia Evaluation  Patient identified by MRN, date of birth, ID band Patient awake    Reviewed: Allergy & Precautions, H&P , NPO status , Patient's Chart, lab work & pertinent test results, reviewed documented beta blocker date and time   History of Anesthesia Complications Negative for: history of anesthetic complications  Airway Mallampati: III  TM Distance: >3 FB Neck ROM: full    Dental  (+) Chipped, Dental Advidsory Given   Pulmonary neg pulmonary ROS, neg shortness of breath, neg sleep apnea, neg COPD, former smoker   Pulmonary exam normal        Cardiovascular Exercise Tolerance: Good hypertension, (-) angina + CAD, + Past MI and + Cardiac Stents  (-) CABG Normal cardiovascular exam(-) dysrhythmias (-) Valvular Problems/Murmurs     Neuro/Psych  PSYCHIATRIC DISORDERS Anxiety     negative neurological ROS     GI/Hepatic Neg liver ROS,GERD  Medicated and Controlled,,  Endo/Other  negative endocrine ROS    Renal/GU      Musculoskeletal   Abdominal   Peds  Hematology negative hematology ROS (+)   Anesthesia Other Findings Past Medical History: No date: Adenomatous colon polyp No date: Anxiety No date: Aortic atherosclerosis (HCC) 03/2016: Apical mural thrombus     Comment:  a.) developed as result of anterior STEMI; b.) s/p 3               months of anticoagulation using warfarin No date: Atypical chest pain No date: Avascular necrosis of left femoral head (HCC) No date: Bilateral carotid artery disease (HCC)     Comment:  a.) carotid doppler 10/13/2017: 1-39% BICA No date: BPH (benign prostatic hyperplasia) No date: Coronary artery disease     Comment:  a. 03/2016: LM nl, LAD thrombotic occlusion (3.0x38               Resolute Integrity DES), D1 70 (jailed), mild to mod               nonobs LCx & RCA dzs; b. MV 7/18: Mod mid/apical antsept               defect, no isch, EF 45%->Med Rx; c. 09/2017 Cath:  LM nl,               LAD patent stent, D1 70ost (jailed), LCX 50p, OM1 100,               OM2 30, RCA 30p, RPAV 50, EF 50-55%->Med Rx. No date: Daily consumption of alcohol No date: Diverticulosis No date: Erectile dysfunction No date: GERD (gastroesophageal reflux disease) No date: HFrEF (heart failure with reduced ejection fraction) (HCC)     Comment:  a. TTE 2/18:  EF of 30-35% (in setting of MI); b. TTE               5/18: EF 35-40%; c. 10/2017 Echo: EF 40-45%,               mid-apicalantsept, ant, ap sev HK, Gr1 DD. Mild BAE. No date: History of Clostridium difficile colitis No date: HOH (hard of hearing) No date: Hyperlipidemia No date: Hypertension No date: Incomplete right bundle branch block (RBBB) No date: Ischemic cardiomyopathy     Comment:  a. TTE 2/18:  EF of 30-35% with LAD territory AK, likely              mural thrombus at the LV apex; b. TTE 5/18: EF 35-40%,  mid and apical anterior/anteroseptal AK, no evidence of               LV thrombus, Gr1DD, mild BAE, normal RVSF; c. 10/2017               Echo: EF 40-45%, mid-apicalantsept, ant, ap sev HK, Gr1               DD. Mild BAE. No date: Marijuana user No date: Multiple pulmonary nodules No date: On long term clopidogrel therapy No date: Osteoarthritis No date: Pulmonary emphysema (HCC) No date: Right hydrocele No date: Right inguinal hernia 04/06/2019: Splenic laceration (grade V)     Comment:  a.) s/p splenectomy 04/06/2019 03/09/2016: ST elevation myocardial infarction (STEMI) of anterior  wall (HCC)     Comment:  a.) late presenting STEMI (symptom onset ~18 hours prior              to arrival) 03/09/2016 - thrombotic occlusion p/mLAD               involving D1/D2 --> IVUS-guided PCI to p/mLAD (Integrity               Resolute 3.0 x 38 mm DES) with 0% residual stenosis -> D2              was jailed by the LAD stent resulting in 70% ostial               stenosis with TIMI-3 flow. No date: Umbilical  hernia  Past Surgical History: 04/22/2017: COLONOSCOPY WITH PROPOFOL; N/A     Comment:  Procedure: COLONOSCOPY WITH PROPOFOL;  Surgeon: [name], MD;  Location: [facility] ENDOSCOPY;  Service:               Gastroenterology;  Laterality: N/A; 07/13/2022: COLONOSCOPY WITH PROPOFOL; N/A     Comment:  Procedure: COLONOSCOPY WITH PROPOFOL;  Surgeon: [name], MD;  Location[facility] ENDOSCOPY;  Service:               Gastroenterology;  Laterality: N/A; 03/09/2016: CORONARY ANGIOGRAPHY; N/A     Comment:  Procedure: Coronary Angiography;  Surgeon: Yvonne Kendall, MD;  Location: [facility] INVASIVE CV LAB;  Service:               Cardiovascular;  Laterality: N/A; 03/09/2016: CORONARY STENT INTERVENTION; N/A     Comment:  Procedure: Coronary Stent Intervention;  Surgeon:               Yvonne Kendall, MD;  Location: [facility] INVASIVE CV LAB;                Service: Cardiovascular;  Laterality: N/A; 2005: HERNIA REPAIR No date: HYDROCELE EXCISION 09/20/2017: LEFT HEART CATH AND CORONARY ANGIOGRAPHY; Left     Comment:  Procedure: LEFT HEART CATH AND CORONARY ANGIOGRAPHY;                Surgeon: Yvonne Kendall, MD;  Location: [facility] INVASIVE               CV LAB;  Service: Cardiovascular;  Laterality: Left; 04/06/2019: SPLENECTOMY; N/A     Reproductive/Obstetrics negative OB ROS  Anesthesia Physical Anesthesia Plan  ASA: 3  Anesthesia Plan: General ETT   Post-op Pain Management:    Induction: Intravenous  PONV Risk Score and Plan: 2 and Ondansetron and Dexamethasone  Airway Management Planned: Oral ETT  Additional Equipment:   Intra-op Plan:   Post-operative Plan: Extubation in OR  Informed Consent: I have reviewed the patients History and Physical, chart, labs and discussed the procedure including the risks, benefits and alternatives for the proposed anesthesia with the patient or authorized  representative who has indicated his/her understanding and acceptance.     Dental Advisory Given  Plan Discussed with: Anesthesiologist, CRNA and Surgeon  Anesthesia Plan Comments: (Patient consented for risks of anesthesia including but not limited to:  - adverse reactions to medications - damage to eyes, teeth, lips or other oral mucosa - nerve damage due to positioning  - sore throat or hoarseness - Damage to heart, brain, nerves, lungs, other parts of body or loss of life  Patient voiced understanding and assent.)       Anesthesia Quick Evaluation

## 2023-03-09 NOTE — Interval H&P Note (Signed)
 History and Physical Interval Note:  03/09/2023 11:41 AM  Edward [name].  has presented today for surgery, with the diagnosis of right inguinal hernia  incisional hernia incarcerated less 3 cm initial.  The various methods of treatment have been discussed with the patient and family. After consideration of risks, benefits and other options for treatment, the patient has consented to  Procedure(s): XI ROBOTIC ASSISTED INGUINAL HERNIA (Right) HERNIA REPAIR UMBILICAL ADULT (N/A) as a surgical intervention.  The patient's history has been reviewed, patient examined, no change in status, stable for surgery.  I have reviewed the patient's chart and labs.  Questions were answered to the patient's satisfaction.   The right side is marked.   [name]

## 2023-03-09 NOTE — Anesthesia Procedure Notes (Signed)
 Procedure Name: Intubation Date/Time: 03/09/2023 11:59 AM  Performed by: Genia Camie BIRCH, CRNAPre-anesthesia Checklist: Patient identified, Emergency Drugs available, Suction available, Patient being monitored and Timeout performed Patient Re-evaluated:Patient Re-evaluated prior to induction Oxygen Delivery Method: Circle system utilized Preoxygenation: Pre-oxygenation with 100% oxygen Induction Type: IV induction Ventilation: Oral airway inserted - appropriate to patient size and Two handed mask ventilation required Laryngoscope Size: Miller and 2 Grade View: Grade I Tube type: Oral Tube size: 7.5 mm Number of attempts: 1 Airway Equipment and Method: Stylet and Oral airway Placement Confirmation: ETT inserted through vocal cords under direct vision, positive ETCO2 and breath sounds checked- equal and bilateral Secured at: 22 cm Tube secured with: Tape Dental Injury: Teeth and Oropharynx as per pre-operative assessment  Comments: Large leak with pt facial hair

## 2023-03-09 NOTE — Transfer of Care (Signed)
 Immediate Anesthesia Transfer of Care Note  Patient: Edward R Vandenberghe Jr.  Procedure(s) Performed: XI ROBOTIC ASSISTED INGUINAL HERNIA (Right) HERNIA REPAIR UMBILICAL ADULT  Patient Location: PACU  Anesthesia Type:General  Level of Consciousness: drowsy  Airway & Oxygen Therapy: Patient connected to nasal cannula oxygen  Post-op Assessment: Report given to RN  Post vital signs: Reviewed and stable  Last Vitals:  Vitals Value Taken Time  BP 131/76 03/09/23 1406  Temp    Pulse 62 03/09/23 1408  Resp 14 03/09/23 1408  SpO2 97 % 03/09/23 1408  Vitals shown include unfiled device data.  Last Pain:  Vitals:   03/09/23 1045  TempSrc: Temporal  PainSc: 0-No pain         Complications: No notable events documented.

## 2023-03-10 ENCOUNTER — Encounter: Payer: Self-pay | Admitting: Surgery
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
## 2023-03-22 ENCOUNTER — Encounter: Payer: Self-pay | Admitting: Surgery

## 2023-03-22 ENCOUNTER — Ambulatory Visit (INDEPENDENT_AMBULATORY_CARE_PROVIDER_SITE_OTHER): Payer: Medicare Other | Admitting: Surgery

## 2023-03-22 VITALS — BP 111/70 | HR 68 | Temp 98.2°F | Ht 70.0 in | Wt 221.0 lb

## 2023-03-22 DIAGNOSIS — Z9889 Other specified postprocedural states: Secondary | ICD-10-CM

## 2023-03-22 DIAGNOSIS — K4090 Unilateral inguinal hernia, without obstruction or gangrene, not specified as recurrent: Secondary | ICD-10-CM

## 2023-03-22 DIAGNOSIS — Z8719 Personal history of other diseases of the digestive system: Secondary | ICD-10-CM | POA: Insufficient documentation

## 2023-03-22 NOTE — Patient Instructions (Signed)

## 2023-03-23 ENCOUNTER — Encounter: Payer: Medicare Other | Admitting: Physician Assistant

## 2023-05-26 ENCOUNTER — Ambulatory Visit: Payer: Self-pay

## 2023-05-26 NOTE — Telephone Encounter (Signed)
 Pt advised. Verbalized understanding. Mentioned removing a tick yesterday and site not looking too good but reports he will look at another day to see if visit is needed but for now no concerns.  Just FYI
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
## 2023-05-26 NOTE — Telephone Encounter (Signed)
 Since already starting to improve and at day 5 of symptoms, liekly doesn't need antiviral at this time. Efficacy of antivirals is much less than it used to be also. Can do virtual visit if he wants to talk through options and get guidance on symptom management.

## 2023-06-02 ENCOUNTER — Encounter: Payer: Self-pay | Admitting: Surgery

## 2023-08-01 DIAGNOSIS — D2272 Melanocytic nevi of left lower limb, including hip: Secondary | ICD-10-CM | POA: Diagnosis not present

## 2023-08-01 DIAGNOSIS — L281 Prurigo nodularis: Secondary | ICD-10-CM | POA: Diagnosis not present

## 2023-08-01 DIAGNOSIS — D2262 Melanocytic nevi of left upper limb, including shoulder: Secondary | ICD-10-CM | POA: Diagnosis not present

## 2023-08-01 DIAGNOSIS — D225 Melanocytic nevi of trunk: Secondary | ICD-10-CM | POA: Diagnosis not present

## 2023-08-01 DIAGNOSIS — D2261 Melanocytic nevi of right upper limb, including shoulder: Secondary | ICD-10-CM | POA: Diagnosis not present

## 2023-08-01 DIAGNOSIS — D171 Benign lipomatous neoplasm of skin and subcutaneous tissue of trunk: Secondary | ICD-10-CM | POA: Diagnosis not present

## 2023-08-12 ENCOUNTER — Other Ambulatory Visit: Payer: Self-pay | Admitting: Family Medicine

## 2023-08-12 DIAGNOSIS — R39198 Other difficulties with micturition: Secondary | ICD-10-CM

## 2023-08-25 ENCOUNTER — Ambulatory Visit: Payer: Medicare Other | Admitting: Urology

## 2023-09-01 ENCOUNTER — Ambulatory Visit (INDEPENDENT_AMBULATORY_CARE_PROVIDER_SITE_OTHER): Payer: Self-pay | Admitting: Urology

## 2023-09-01 VITALS — BP 121/81 | HR 75 | Wt 199.0 lb

## 2023-09-01 DIAGNOSIS — Z125 Encounter for screening for malignant neoplasm of prostate: Secondary | ICD-10-CM

## 2023-09-01 DIAGNOSIS — N432 Other hydrocele: Secondary | ICD-10-CM | POA: Diagnosis not present

## 2023-09-01 DIAGNOSIS — R399 Unspecified symptoms and signs involving the genitourinary system: Secondary | ICD-10-CM

## 2023-09-01 LAB — BLADDER SCAN AMB NON-IMAGING

## 2023-09-01 NOTE — Patient Instructions (Signed)
Hydrocele, Adult A hydrocele is a collection of fluid in the loose pouch of skin that holds the testicles (scrotum). It can occur in one or both testicles. This may happen because: The amount of fluid produced in the scrotum is not absorbed by the rest of the body. Fluid from the abdomen fills the scrotum. Normally, the testicles develop in the abdomen and then drop into the scrotum before birth. The tube that the testicles travel through usually closes after the testicles drop. If the tube does not close, fluid from the abdomen can fill the scrotum. This is not very common in adults. What are the causes? A hydrocele may be caused by: An injury to the scrotum. An infection. Decreased blood flow to the scrotum. Twisting of a testicle (testicular torsion). A birth defect. A tumor or cancer of the testicle. Sometimes, the cause is not known. What are the signs or symptoms? A hydrocele feels like a water-filled balloon. It may also feel heavy. Other symptoms include: Swelling of the scrotum. The swelling may decrease when you lie down. You may also notice more swelling at night than in the morning. This is called a communicating hydrocele, in which the fluid in the scrotum goes back into the abdominal cavity when the position of the scrotum changes. Swelling of the groin. Mild discomfort in the scrotum. Pain. This can develop if the hydrocele was caused by infection or twisting. The larger the hydrocele, the more likely you are to have pain. Swelling may also cause pain. How is this diagnosed? This condition may be diagnosed based on a physical exam and your medical history. You may also have tests, including: Imaging tests, such as an ultrasound. A transillumination test. This test takes place in a dark room where a light is placed on the skin of the scrotum. Clear liquid will not impede the light and the scrotum will be illuminated. This helps a health care provider distinguish a hydrocele from a  tumor. Blood or urine tests. How is this treated? Most hydroceles go away on their own. If you have no discomfort or pain, your health care provider may suggest close monitoring of your condition until the condition goes away or symptoms develop. This is called watch and wait or watchful waiting. If treatment is needed, it may include: Treating an underlying condition. This may include taking an antibiotic medicine to treat an infection. Having surgery to stop fluid from collecting in the scrotum. Having surgery to drain the fluid. Surgery may include: Hydrocelectomy. For this procedure, an incision is made in the scrotum to remove the fluid. Needle aspiration. A needle is used to drain fluid. However, the fluid buildup will come back quickly and may lead to an infection of the scrotum. This is rarely done. Follow these instructions at home: Medicines Take over-the-counter and prescription medicines only as told by your health care provider. If you were prescribed an antibiotic medicine, take it as told by your health care provider. Do not stop taking the antibiotic even if you start to feel better. General instructions Watch the hydrocele for any changes. Keep all follow-up visits. This is important. Contact a health care provider if: You notice any changes in the hydrocele. The swelling in your scrotum or groin gets worse. The hydrocele becomes red, firm, painful, or tender to the touch. You have a fever. Get help right away if you: Develop a lot of pain or your pain becomes worse. Have chills. Have a high fever. Summary A hydrocele is   a collection of fluid in the loose pouch of skin that holds the testicles (scrotum). A hydrocele can cause swelling, discomfort, and pain. In adults, the cause of a hydrocele may not be known. However, it is sometimes caused by an infection or the twisting of a testicle. Hydroceles often go away on their own. If a hydrocele causes pain, treating the  underlying cause may be needed to ease the pain. This information is not intended to replace advice given to you by your health care provider. Make sure you discuss any questions you have with your health care provider. Document Revised: 09/04/2020 Document Reviewed: 09/04/2020 Elsevier Patient Education  2024 Elsevier Inc.  

## 2023-09-01 NOTE — Progress Notes (Signed)
   09/01/2023 9:27 AM   Edward [name]. 07-Jun-1950 [id number]  Reason for visit: Follow up BPH/LUTS, PSA screening, right hydrocele  HPI: 73 year old male who I originally saw in 2022 for possible filling defect in the bladder on CT, cystoscopy was negative.  He had mild urinary symptoms well-controlled on Flomax , not interested in further treatments.  PVRs have been normal.  Prostate was 22 g on CT in March 2022.  PSA has always been normal and less than 1, most recently 0.08 August 2022, we reviewed the guidelines that do not recommend routine screening in men over age 49.  Denies any major changes in urination today, he has gone on and off the Flomax , but overall feels that he is better on it and would like to continue that medication.  PVR today normal at 16ml.  We again reviewed the AUA guidelines that do not recommend routine screening in men over age 7, reassurance provided regarding PSA <1.  He had a number of questions today about right scrotal swelling.  He reportedly has a long history of a right hydrocele has had aspirations multiple times previously, as well as a prior right hydrocele repair.  He is also had multiple right hernia surgeries with mesh, most recently in February 2025.  I reviewed those notes from Dr. Talbert..  On exam he has some very mild right-sided scrotal swelling that is nontender.  Reassurance was provided.  Could consider ultrasound in the future if worsening symptoms  Okay to order scrotal ultrasound in the future if worsening right scrotal swelling Continue Flomax   RTC 1 year PVR   Edward JAYSON Burnet, MD  [facility] [address], [address] [place], [place] [address] [phone]

## 2023-09-05 ENCOUNTER — Encounter: Payer: Self-pay | Admitting: Family Medicine

## 2023-09-05 ENCOUNTER — Ambulatory Visit (INDEPENDENT_AMBULATORY_CARE_PROVIDER_SITE_OTHER): Payer: Medicare Other | Admitting: Family Medicine

## 2023-09-05 VITALS — BP 117/71 | HR 61 | Temp 97.8°F | Resp 16 | Wt 198.0 lb

## 2023-09-05 DIAGNOSIS — R7303 Prediabetes: Secondary | ICD-10-CM

## 2023-09-05 DIAGNOSIS — I2510 Atherosclerotic heart disease of native coronary artery without angina pectoris: Secondary | ICD-10-CM

## 2023-09-05 DIAGNOSIS — Z6828 Body mass index (BMI) 28.0-28.9, adult: Secondary | ICD-10-CM | POA: Diagnosis not present

## 2023-09-05 DIAGNOSIS — I10 Essential (primary) hypertension: Secondary | ICD-10-CM | POA: Diagnosis not present

## 2023-09-05 DIAGNOSIS — E782 Mixed hyperlipidemia: Secondary | ICD-10-CM | POA: Diagnosis not present

## 2023-09-05 NOTE — Progress Notes (Signed)
 Established Patient Office Visit  Introduced to nurse practitioner role and practice setting.  All questions answered.  Discussed provider/patient relationship and expectations.  Subjective   Patient ID: Edward JONELLE Nettie Mickey., male    DOB: 1950-04-07  Age: 73 y.o. MRN: [id number]  Chief Complaint  Patient presents with   Medical Management of Chronic Issues    6 months follow-up BP    Discussed the use of AI scribe software for clinical note transcription with the patient, who gave verbal consent to proceed.  History of Present Illness Edward Hackler. is a 73 year old male with coronary artery disease and hypertension who presents for routine follow-up and lab work.  Coronary artery disease and angina - History of coronary artery disease with coronary stents in place - Managed with clopidogrel  75 mg daily and atorvastatin  80 mg daily - Uses nitroglycerin  0.4 mg sublingual tablets as needed for angina - No current anginal symptoms reported  Hypertension - Blood pressure well controlled on hydrochlorothiazide  25 mg and losartan  50 mg daily - Recent blood pressure readings stable at 117/71 mmHg  Prediabetes and weight management - Previous hemoglobin A1c of 6.3% - Weight loss of 23 pounds since February, current weight 198 pounds - Weight loss attributed to a no-carbohydrate diet and increased physical activity - History of fluctuating weight, with previous high of 227 pounds - Engages in daily walking, averaging at least 11,000 steps  Postoperative status and gastrointestinal symptoms - Underwent hernia operation in February - Occasional constipation, especially when following a no-carbohydrate diet - Uses polyethylene glycol (Miralax ) as needed for constipation  Lifestyle and social activities - Active in music, 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[place] fiddling - Participates in music-related events - Intermittent use of beer and whiskey, often as compensation for performances        03/08/2023   10:36 AM 02/07/2023   10:22 AM 08/12/2022    8:57 AM  Depression screen PHQ 2/9  Decreased Interest 0 0 0  Down, Depressed, Hopeless 0 0 0  PHQ - 2 Score 0 0 0  Altered sleeping 0 0   Tired, decreased energy 0 1   Change in appetite 0 0   Feeling bad or failure about yourself  0 0   Trouble concentrating 0 0   Moving slowly or fidgety/restless 0 0   Suicidal thoughts 0 0   PHQ-9 Score 0 1   Difficult doing work/chores Not difficult at all Not difficult at all        02/24/2018    8:20 AM  GAD 7 : Generalized Anxiety Score  Nervous, Anxious, on Edge 1  Control/stop worrying 1  Worry too much - different things 1  Trouble relaxing 0  Restless 1  Easily annoyed or irritable 0  Afraid - awful might happen 0  Total GAD 7 Score 4  Anxiety Difficulty Not difficult at all     ROS  Negative unless indicated in HPI   Objective:     BP 117/71 (BP Location: Left Arm, Patient Position: Sitting, Cuff Size: Normal)   Pulse 61   Temp 97.8 F (36.6 C) (Oral)   Resp 16   Wt 198 lb (89.8 kg)   SpO2 98%   BMI 28.41 kg/m    Physical Exam Constitutional:      General: He is not in acute distress.    Appearance: Normal appearance. He is not ill-appearing, toxic-appearing or diaphoretic.  HENT:     Head: Normocephalic.  Nose: Nose normal.     Mouth/Throat:     Mouth: Mucous membranes are moist.  Eyes:     Extraocular Movements: Extraocular movements intact.     Conjunctiva/sclera: Conjunctivae normal.     Pupils: Pupils are equal, round, and reactive to light.  Cardiovascular:     Rate and Rhythm: Normal rate and regular rhythm.     Heart sounds: No murmur heard.    No friction rub. No gallop.  Pulmonary:     Effort: Pulmonary effort is normal. No respiratory distress.     Breath sounds: Normal breath sounds. No stridor. No wheezing, rhonchi or rales.  Chest:     Chest wall: Deformity present. No tenderness.     Comments: Raised L chest wall,  baseline Musculoskeletal:     Right lower leg: No edema.     Left lower leg: No edema.  Skin:    General: Skin is warm and dry.     Capillary Refill: Capillary refill takes less than 2 seconds.  Neurological:     General: No focal deficit present.     Mental Status: He is alert and oriented to person, place, and time. Mental status is at baseline.     Cranial Nerves: No cranial nerve deficit.     Sensory: No sensory deficit.     Motor: No weakness.     Coordination: Coordination normal.     Gait: Gait normal.      No results found for any visits on 09/05/23.    The ASCVD Risk score (Arnett DK, et al., 2019) failed to calculate for the following reasons:   Risk score cannot be calculated because patient has a medical history suggesting prior/existing ASCVD    Assessment & Plan:  Essential hypertension -     Comprehensive metabolic panel with GFR  Hyperlipidemia, mixed -     Lipid panel  Prediabetes -     Hemoglobin A1c  BMI 28.0-28.9,adult -     CBC  Coronary artery disease involving native coronary artery of native heart without angina pectoris     Assessment and Plan Assessment & Plan Coronary artery disease with stents Blood pressure controlled at 117/71 mmHg. Continues cardiology follow-up and uses PRN nitroglycerin  for angina - has not needed to use. - Continue Plavix  75 mg daily. - Continue Lipitor  80 mg daily. - Continue PRN nitroglycerin  0.4 mg sublingual tablet for angina. - continue Cardiology mgmt  Hypertension Well-controlled with current medication regimen. - Continue hydrochlorothiazide  25 mg daily. - Continue losartan  50 mg daily. - check CMP today  Benign prostatic hyperplasia Managed with Flomax . Urologist advised against PSA testing due to age and low likelihood of benefit. - Continue Flomax  daily.  Prediabetes Previous A1c was 6.3%. - Order A1c test. - Order comprehensive metabolic panel (CMP). - Continue to make conscious decisions  for well balanced diet smaller portions with increase protein, fruits, veggies, water as drink of choice, decrease starches, processed foods, and saturated fats. Increase weekly exercise - 150 minutes per week.   Hyperlipidemia Managed with Lipitor . - Order lipid panel.  Obesity BMI 28.14, previous 32.28. Weight loss of 23 pounds since last visit, now 198 pounds. Engages in daily walks and follows a low-carb diet. - Encourage continuation of daily walks. - Encourage continuation of low-carb diet. - CBC, CMP, Lipid  Return in about 6 months (around 03/07/2024) for chronic disease mgmt, patient past due for AWV with nurse health advisor.   I, Curtis DELENA Boom, FNP, have reviewed  all documentation for this visit. The documentation on 09/05/23 for the exam, diagnosis, procedures, and orders are all accurate and complete.  Curtis DELENA Boom, FNP

## 2023-09-06 ENCOUNTER — Ambulatory Visit: Payer: Self-pay | Admitting: Family Medicine

## 2023-09-06 LAB — LIPID PANEL
Chol/HDL Ratio: 3.2 ratio (ref 0.0–5.0)
Cholesterol, Total: 134 mg/dL (ref 100–199)
HDL: 42 mg/dL (ref >39)
LDL Chol Calc (NIH): 79 mg/dL (ref 0–99)
Triglycerides: 60 mg/dL (ref 0–149)
VLDL Cholesterol Cal: 13 mg/dL (ref 5–40)

## 2023-09-06 LAB — CBC
Hematocrit: 44.8 % (ref 37.5–51.0)
Hemoglobin: 14.9 g/dL (ref 13.0–17.7)
MCH: 32.7 pg (ref 26.6–33.0)
MCHC: 33.3 g/dL (ref 31.5–35.7)
MCV: 98 fL — ABNORMAL HIGH (ref 79–97)
Platelets: 312 x10E3/uL (ref 150–450)
RBC: 4.56 x10E6/uL (ref 4.14–5.80)
RDW: 14.2 % (ref 11.6–15.4)
WBC: 6 x10E3/uL (ref 3.4–10.8)

## 2023-09-06 LAB — COMPREHENSIVE METABOLIC PANEL WITH GFR
ALT: 15 IU/L (ref 0–44)
AST: 19 IU/L (ref 0–40)
Albumin: 4.3 g/dL (ref 3.8–4.8)
Alkaline Phosphatase: 61 IU/L (ref 44–121)
BUN/Creatinine Ratio: 23 (ref 10–24)
BUN: 20 mg/dL (ref 8–27)
Bilirubin Total: 0.7 mg/dL (ref 0.0–1.2)
CO2: 21 mmol/L (ref 20–29)
Calcium: 9.4 mg/dL (ref 8.6–10.2)
Chloride: 103 mmol/L (ref 96–106)
Creatinine, Ser: 0.87 mg/dL (ref 0.76–1.27)
Globulin, Total: 2.4 g/dL (ref 1.5–4.5)
Glucose: 104 mg/dL — ABNORMAL HIGH (ref 70–99)
Potassium: 3.7 mmol/L (ref 3.5–5.2)
Sodium: 141 mmol/L (ref 134–144)
Total Protein: 6.7 g/dL (ref 6.0–8.5)
eGFR: 92 mL/min/1.73 (ref >59)

## 2023-09-06 LAB — HEMOGLOBIN A1C
Est. average glucose Bld gHb Est-mCnc: 117 mg/dL
Hgb A1c MFr Bld: 5.7 % — ABNORMAL HIGH (ref 4.8–5.6)

## 2023-11-02 ENCOUNTER — Ambulatory Visit: Attending: Internal Medicine | Admitting: Internal Medicine

## 2023-11-02 VITALS — BP 122/62 | HR 59 | Ht 71.0 in | Wt 199.6 lb

## 2023-11-02 DIAGNOSIS — I10 Essential (primary) hypertension: Secondary | ICD-10-CM | POA: Diagnosis not present

## 2023-11-02 DIAGNOSIS — E785 Hyperlipidemia, unspecified: Secondary | ICD-10-CM | POA: Insufficient documentation

## 2023-11-02 DIAGNOSIS — I5020 Unspecified systolic (congestive) heart failure: Secondary | ICD-10-CM | POA: Diagnosis not present

## 2023-11-02 DIAGNOSIS — I2510 Atherosclerotic heart disease of native coronary artery without angina pectoris: Secondary | ICD-10-CM | POA: Insufficient documentation

## 2023-11-02 NOTE — Patient Instructions (Signed)
 Medication Instructions:  Your physician recommends that you continue on your current medications as directed. Please refer to the Current Medication list given to you today.    *If you need a refill on your cardiac medications before your next appointment, please call your pharmacy*  Lab Work: No labs ordered today    Testing/Procedures: No test ordered today   Follow-Up: At [facility], you and your health needs are our priority.  As part of our continuing mission to provide you with exceptional heart care, our providers are all part of one team.  This team includes your primary Cardiologist (physician) and Advanced Practice Providers or APPs (Physician Assistants and Nurse Practitioners) who all work together to provide you with the care you need, when you need it.  Your next appointment:   6 month(s)  Provider:   Lonni Hanson, MD
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
## 2023-11-03 ENCOUNTER — Encounter: Payer: Self-pay | Admitting: Internal Medicine

## 2023-11-03 DIAGNOSIS — I5020 Unspecified systolic (congestive) heart failure: Secondary | ICD-10-CM | POA: Insufficient documentation

## 2023-11-07 ENCOUNTER — Ambulatory Visit
Admission: RE | Admit: 2023-11-07 | Discharge: 2023-11-07 | Disposition: A | Discharge status: Home / Self Care | Source: Ambulatory Visit | Attending: Acute Care | Admitting: Acute Care

## 2023-11-07 DIAGNOSIS — Z23 Encounter for immunization: Secondary | ICD-10-CM | POA: Diagnosis not present

## 2023-11-07 DIAGNOSIS — Z122 Encounter for screening for malignant neoplasm of respiratory organs: Secondary | ICD-10-CM | POA: Insufficient documentation

## 2023-11-07 DIAGNOSIS — Z87891 Personal history of nicotine dependence: Secondary | ICD-10-CM | POA: Insufficient documentation

## 2023-11-09 ENCOUNTER — Other Ambulatory Visit: Payer: Self-pay

## 2023-11-09 DIAGNOSIS — Z122 Encounter for screening for malignant neoplasm of respiratory organs: Secondary | ICD-10-CM

## 2023-11-09 DIAGNOSIS — Z87891 Personal history of nicotine dependence: Secondary | ICD-10-CM

## 2023-12-20 ENCOUNTER — Ambulatory Visit (INDEPENDENT_AMBULATORY_CARE_PROVIDER_SITE_OTHER)

## 2023-12-20 VITALS — BP 112/70 | Ht 70.0 in | Wt 210.4 lb

## 2023-12-20 DIAGNOSIS — Z0000 Encounter for general adult medical examination without abnormal findings: Secondary | ICD-10-CM

## 2023-12-20 NOTE — Patient Instructions (Signed)
 [name],  Thank you for taking the time for your Medicare Wellness Visit. I appreciate your continued commitment to your health goals. Please review the care plan we discussed, and feel free to reach out if I can assist you further.  Please note that Annual Wellness Visits do not include a physical exam. Some assessments may be limited, especially if the visit was conducted virtually. If needed, we may recommend an in-person follow-up with your provider.  Ongoing Care Seeing your primary care provider every 3 to 6 months helps us  monitor your health and provide consistent, personalized care.   Referrals If a referral was made during today's visit and you haven't received any updates within two weeks, please contact the referred provider directly to check on the status.  Recommended Screenings:  Health Maintenance  Topic Date Due   Zoster (Shingles) Vaccine (1 of 2) Never done   DTaP/Tdap/Td vaccine (2 - Td or Tdap) 11/24/2022   COVID-19 Vaccine (7 - 2025-26 season) 01/02/2024   Screening for Lung Cancer  11/06/2024   Medicare Annual Wellness Visit  12/19/2024   Colon Cancer Screening  07/13/2027   Pneumococcal Vaccine for age over 110  Completed   Flu Shot  Completed   Hepatitis C Screening  Completed   Meningitis B Vaccine  Aged Out   Stool Blood Test  Discontinued     Vision: Annual vision screenings are recommended for early detection of glaucoma, cataracts, and diabetic retinopathy. These exams can also reveal signs of chronic conditions such as diabetes and high blood pressure.  Dental: Annual dental screenings help detect early signs of oral cancer, gum disease, and other conditions linked to overall health, including heart disease and diabetes.  Please see the attached documents for additional preventive care recommendations.   NEXT AWV 12/25/24 @ 10:10 AM IN PERSON

## 2023-12-20 NOTE — Progress Notes (Signed)
 No chief complaint on file.    Subjective:   Edward JONELLE Nettie Mickey. is a 73 y.o. male who presents for a Medicare Annual Wellness Visit.  Allergies (verified) Patient has no known allergies.   History: Past Medical History:  Diagnosis Date   Adenomatous colon polyp    Anxiety    Aortic atherosclerosis    Apical mural thrombus 03/2016   a.) developed as result of anterior STEMI; b.) s/p 3 months of anticoagulation using warfarin   Atypical chest pain    Avascular necrosis of left femoral head (HCC)    Bilateral carotid artery disease    a.) carotid doppler 10/13/2017: 1-39% BICA   BPH (benign prostatic hyperplasia)    Coronary artery disease    a. 03/2016: LM nl, LAD thrombotic occlusion (3.0x38 Resolute Integrity DES), D1 70 (jailed), mild to mod nonobs LCx & RCA dzs; b. MV 7/18: Mod mid/apical antsept defect, no isch, EF 45%->Med Rx; c. 09/2017 Cath: LM nl, LAD patent stent, D1 70ost (jailed), LCX 50p, OM1 100, OM2 30, RCA 30p, RPAV 50, EF 50-55%->Med Rx.   Daily consumption of alcohol    Diverticulosis    Erectile dysfunction    GERD (gastroesophageal reflux disease)    HFrEF (heart failure with reduced ejection fraction) (HCC)    a. TTE 2/18:  EF of 30-35% (in setting of MI); b. TTE 5/18: EF 35-40%; c. 10/2017 Echo: EF 40-45%, mid-apicalantsept, ant, ap sev HK, Gr1 DD. Mild BAE.   History of Clostridium difficile colitis    HOH (hard of hearing)    Hyperlipidemia    Hypertension    Incisional hernia, without obstruction or gangrene 02/12/2023   Incomplete right bundle branch block (RBBB)    Ischemic cardiomyopathy    a. TTE 2/18:  EF of 30-35% with LAD territory AK, likely mural thrombus at the LV apex; b. TTE 5/18: EF 35-40%, mid and apical anterior/anteroseptal AK, no evidence of LV thrombus, Gr1DD, mild BAE, normal RVSF; c. 10/2017 Echo: EF 40-45%, mid-apicalantsept, ant, ap sev HK, Gr1 DD. Mild BAE.   Marijuana user    Multiple pulmonary nodules    On long term clopidogrel   therapy    Osteoarthritis    Pulmonary emphysema (HCC)    Right hydrocele    Right inguinal hernia    Splenic laceration (grade V) 04/06/2019   a.) s/p splenectomy 04/06/2019   ST elevation myocardial infarction (STEMI) of anterior wall (HCC) 03/09/2016   a.) late presenting STEMI (symptom onset ~18 hours prior to arrival) 03/09/2016 - thrombotic occlusion p/mLAD involving D1/D2 --> IVUS-guided PCI to p/mLAD (Integrity Resolute 3.0 x 38 mm DES) with 0% residual stenosis -> D2 was jailed by the LAD stent resulting in 70% ostial stenosis with TIMI-3 flow.   Umbilical hernia    Past Surgical History:  Procedure Laterality Date   COLONOSCOPY WITH PROPOFOL  N/A 04/22/2017   Procedure: COLONOSCOPY WITH PROPOFOL ;  Surgeon: Therisa Bi, MD;  Location: [facility] ENDOSCOPY;  Service: Gastroenterology;  Laterality: N/A;   COLONOSCOPY WITH PROPOFOL  N/A 07/13/2022   Procedure: COLONOSCOPY WITH PROPOFOL ;  Surgeon: Therisa Bi, MD;  Location: [facility] ENDOSCOPY;  Service: Gastroenterology;  Laterality: N/A;   CORONARY ANGIOGRAPHY N/A 03/09/2016   Procedure: Coronary Angiography;  Surgeon: Lonni Hanson, MD;  Location: [facility] INVASIVE CV LAB;  Service: Cardiovascular;  Laterality: N/A;   CORONARY STENT INTERVENTION N/A 03/09/2016   Procedure: Coronary Stent Intervention;  Surgeon: Lonni Hanson, MD;  Location: [facility] INVASIVE CV LAB;  Service: Cardiovascular;  Laterality: N/A;  HERNIA REPAIR  2005   HERNIORRHAPHY, INGUINAL, ROBOT-ASSISTED, LAPAROSCOPIC Right 03/09/2023   Procedure: XI ROBOTIC ASSISTED INGUINAL HERNIA;  Surgeon: Lane Shope, MD;  Location: [facility] ORS;  Service: General;  Laterality: Right;   HYDROCELE EXCISION     LEFT HEART CATH AND CORONARY ANGIOGRAPHY Left 09/20/2017   Procedure: LEFT HEART CATH AND CORONARY ANGIOGRAPHY;  Surgeon: Mady Bruckner, MD;  Location: [facility] INVASIVE CV LAB;  Service: Cardiovascular;  Laterality: Left;   SPLENECTOMY N/A 04/06/2019   UMBILICAL HERNIA REPAIR N/A  03/09/2023   Procedure: HERNIA REPAIR UMBILICAL ADULT;  Surgeon: Lane Shope, MD;  Location: [facility] ORS;  Service: General;  Laterality: N/A;   Family History  Problem Relation Age of Onset   Emphysema Mother    Macular degeneration Mother    Heart attack Mother    COPD Mother    Heart attack Father    Heart disease Father    Social History   Occupational History   Not on file  Tobacco Use   Smoking status: Former    Current packs/day: 0.00    Average packs/day: 3.0 packs/day for 17.3 years (52.0 ttl pk-yrs)    Types: Cigarettes    Start date: 07/03/2007    Quit date: 12/12/2009    Years since quitting: 14.0    Passive exposure: Past   Smokeless tobacco: Never   Tobacco comments:    QUIT IN 2005  Vaping Use   Vaping status: Never Used  Substance and Sexual Activity   Alcohol use: Yes    Alcohol/week: 9.0 standard drinks of alcohol    Types: 1 Glasses of wine, 8 Cans of beer per week    Comment: average of 2 beers per day   Drug use: Yes    Frequency: 1.0 times per week    Types: Marijuana    Comment: smokes pot occassionally   Sexual activity: Yes    Birth control/protection: None   Tobacco Counseling Counseling given: Not Answered Tobacco comments: QUIT IN 2005  SDOH Screenings   Food Insecurity: Unknown (12/16/2023)  Housing: Low Risk  (12/16/2023)  Transportation Needs: No Transportation Needs (12/16/2023)  Utilities: Not At Risk (09/05/2023)  Alcohol Screen: Low Risk  (12/16/2023)  Depression (PHQ2-9): Low Risk  (03/08/2023)  Financial Resource Strain: Low Risk  (12/16/2023)  Physical Activity: Sufficiently Active (09/05/2023)  Social Connections: Socially Integrated (12/16/2023)  Stress: Stress Concern Present (09/05/2023)  Tobacco Use: Medium Risk (11/03/2023)  Health Literacy: Adequate Health Literacy (09/05/2023)   See flowsheets for full screening details  Depression Screen PHQ 2 & 9 Depression Scale- Over the past 2 weeks, how often have you been bothered  by any of the following problems? Little interest or pleasure in doing things: 0 Feeling down, depressed, or hopeless (PHQ Adolescent also includes...irritable): 0 PHQ-2 Total Score: 0 Trouble falling or staying asleep, or sleeping too much: 0 Feeling tired or having little energy: 0 Poor appetite or overeating (PHQ Adolescent also includes...weight loss): 0 Feeling bad about yourself - or that you are a failure or have let yourself or your family down: 0 Trouble concentrating on things, such as reading the newspaper or watching television (PHQ Adolescent also includes...like school work): 0 Moving or speaking so slowly that other people could have noticed. Or the opposite - being so fidgety or restless that you have been moving around a lot more than usual: 0 Thoughts that you would be better off dead, or of hurting yourself in some way: 0 PHQ-9 Total Score: 0 If you  checked off any problems, how difficult have these problems made it for you to do your work, take care of things at home, or get along with other people?: Not difficult at all     Goals Addressed   None    Functional Status Activities of Daily Living (to include ambulation/medication): (Patient-Rptd) Independent Ambulation: (Patient-Rptd) Independent Home Management: (Patient-Rptd) Independent  Fall Screening Falls in the past year?: (Patient-Rptd) 0 Number of falls in past year: (Patient-Rptd) 0 Was there an injury with Fall?: (Patient-Rptd) 0 Fall Risk Category Calculator: (Patient-Rptd) 0 Patient Fall Risk Level: (Patient-Rptd) Low Fall Risk  Advance Directives (For Healthcare) Does Patient Have a Medical Advance Directive?: Yes Does patient want to make changes to medical advance directive?: No - Patient declined Type of Advance Directive: Healthcare Power of [place]; Living will Copy of Healthcare Power of Attorney in Chart?: No - copy requested        Objective:    Today's Vitals   12/20/23 1019  BP:  112/70  Weight: 210 lb 6.4 oz (95.4 kg)  Height: 5' 10 (1.778 m)   Body mass index is 30.19 kg/m.  Current Medications (verified) Outpatient Encounter Medications as of 12/20/2023  Medication Sig   atorvastatin  (LIPITOR ) 80 MG tablet Take 1 tablet (80 mg total) by mouth daily.   clopidogrel  (PLAVIX ) 75 MG tablet Take 1 tablet (75 mg total) by mouth daily.   hydrochlorothiazide  (HYDRODIURIL ) 25 MG tablet Take 1 tablet (25 mg total) by mouth daily.   losartan  (COZAAR ) 50 MG tablet Take 1 tablet (50 mg total) by mouth daily.   nitroGLYCERIN  (NITROSTAT ) 0.4 MG SL tablet Place 1 tablet (0.4 mg total) under the tongue every 5 (five) minutes as needed for chest pain. Maximum of 3 doses.   RYALTRIS 665-25 MCG/ACT SUSP Place 2 sprays into both nostrils 2 (two) times daily.   Skin Protectants, Misc. (EUCERIN) cream Apply 1 application topically 3 (three) times daily as needed for dry skin.   tamsulosin  (FLOMAX ) 0.4 MG CAPS capsule TAKE 1 CAPSULE DAILY   No facility-administered encounter medications on file as of 12/20/2023.   Hearing/Vision screen No results found. Immunizations and Health Maintenance Health Maintenance  Topic Date Due   Zoster Vaccines- Shingrix (1 of 2) Never done   Medicare Annual Wellness (AWV)  07/16/2022   DTaP/Tdap/Td (2 - Td or Tdap) 11/24/2022   Influenza Vaccine  09/02/2023   COVID-19 Vaccine (6 - 2025-26 season) 10/03/2023   Lung Cancer Screening  11/06/2024   Colonoscopy  07/13/2027   Pneumococcal Vaccine: 50+ Years  Completed   Hepatitis C Screening  Completed   Meningococcal B Vaccine  Aged Out   COLON CANCER SCREENING ANNUAL FOBT  Discontinued        Assessment/Plan:  This is a routine wellness examination for Edward [name].  Patient Care Team: Wellington Curtis LABOR, FNP as PCP - General (Family Medicine) End, Lonni, MD as PCP - Cardiology (Cardiology) Gara Pronto, MD (Inactive) [facility]  I have personally reviewed and  noted the following in the patient's chart:   Medical and social history Use of alcohol, tobacco or illicit drugs  Current medications and supplements including opioid prescriptions. Functional ability and status Nutritional status Physical activity Advanced directives List of other physicians Hospitalizations, surgeries, and ER visits in previous 12 months Vitals Screenings to include cognitive, depression, and falls Referrals and appointments  No orders of the defined types were placed in this encounter.  In addition, I have reviewed  and discussed with patient certain preventive protocols, quality metrics, and best practice recommendations. A written personalized care plan for preventive services as well as general preventive health recommendations were provided to patient.   Edward GORMAN Das, LPN   [id number]   No follow-ups on file.  After Visit Summary: (In Person-Declined) Patient declined AVS at this time.  Nurse Notes: NEEDS TDAP; UTD ON SHOTS; UTD ON COLONOSCOPY; UTD ON LUNG CA SCREENING

## 2024-01-23 ENCOUNTER — Other Ambulatory Visit: Payer: Self-pay | Admitting: Internal Medicine

## 2024-02-23 ENCOUNTER — Telehealth: Payer: Self-pay | Admitting: Internal Medicine

## 2024-02-23 NOTE — Telephone Encounter (Signed)
" °*  STAT* If patient is at the pharmacy, call can be transferred to refill team.   1. Which medications need to be refilled? (please list name of each medication and dose if known)    atorvastatin  (LIPITOR ) 80 MG tablet TAKE 1 TABLET DAILY   clopidogrel  (PLAVIX ) 75 MG tablet TAKE 1 TABLET DAILY   hydrochlorothiazide  (HYDRODIURIL ) 25 MG tablet TAKE 1 TABLET DAILY   losartan  (COZAAR ) 50 MG tablet TAKE 1 TABLET DAILY   nitroGLYCERIN  (NITROSTAT ) 0.4 MG SL tablet (Expired) Place 1 tablet (0.4 mg total) under the tongue every 5 (five) minutes as needed for chest pain. Maximum of 3 doses.   NEW PHARMACY    4. Which pharmacy/location (including street and city if local pharmacy) is medication to be sent to?  [facility] - [place], [place] - [address] [facility] AT Portal to [facility] Phone: [phone]  Fax: [phone]       5. Do they need a 30 day or 90 day supply? 90   "

## 2024-03-07 ENCOUNTER — Ambulatory Visit

## 2024-03-07 ENCOUNTER — Ambulatory Visit: Admitting: Family Medicine

## 2024-03-07 VITALS — BP 111/71 | HR 60 | Temp 98.7°F | Wt 209.7 lb

## 2024-03-07 DIAGNOSIS — R7303 Prediabetes: Secondary | ICD-10-CM

## 2024-03-07 DIAGNOSIS — I5020 Unspecified systolic (congestive) heart failure: Secondary | ICD-10-CM | POA: Diagnosis not present

## 2024-03-07 DIAGNOSIS — R918 Other nonspecific abnormal finding of lung field: Secondary | ICD-10-CM | POA: Diagnosis not present

## 2024-03-07 DIAGNOSIS — I10 Essential (primary) hypertension: Secondary | ICD-10-CM

## 2024-03-07 NOTE — Assessment & Plan Note (Addendum)
 Chronic, controlled. Euvolemic. Follows with cardiology. Continue losartan  and hydrochlorothiazide .

## 2024-03-07 NOTE — Assessment & Plan Note (Signed)
 Last A1c of 5.7. Discussed eating a balanced diet and incorporating movement into daily routine. Will recheck A1c.

## 2024-03-07 NOTE — Progress Notes (Signed)
 "   New patient visit   Patient: Edward R Budzik Jr.   DOB: Mar 20, 1950   74 y.o. Male  MRN: [id number] Visit Date: 03/07/2024  Today's healthcare provider: Isaiah DELENA Pepper, MD   Chief Complaint  Patient presents with   Transitions Of Care    Stomach weirdness Some diarrhea wife had stomach virus for the past three days    Subjective    Edward Oldaker. is a 74 y.o. male who presents today as a new patient to establish care.   Discussed the use of AI scribe software for clinical note transcription with the patient, who gave verbal consent to proceed.  History of Present Illness Edward Spiering. is a 74 year old male who presents with gastrointestinal symptoms following a stomach bug.  He began experiencing symptoms two days ago, initially feeling queasy followed by vomiting, which provided relief. He spent the day in bed, consuming electrolytes. The next day, he attended a funeral and was able to eat pizza, rice, bacon, and eggs without issue, indicating improvement.  He is currently taking medications for cholesterol and blood pressure. He notes that his blood pressure was low during the visit. No current chest pain, but there is occasional awareness of chest sensations.  He has a history of lung nodules, which have been stable and are monitored regularly. He denies wheezing or trouble breathing and has no current use of inhalers.  He occasionally smokes marijuana but not tobacco. He used to smoke daily but now limits his use.   Past Medical History:  Diagnosis Date   Adenomatous colon polyp    Anxiety    Aortic atherosclerosis    Apical mural thrombus 03/2016   a.) developed as result of anterior STEMI; b.) s/p 3 months of anticoagulation using warfarin   Atypical chest pain    Avascular necrosis of left femoral head (HCC)    Bilateral carotid artery disease    a.) carotid doppler 10/13/2017: 1-39% BICA   BPH (benign prostatic hyperplasia)    Coronary artery disease    a.  03/2016: LM nl, LAD thrombotic occlusion (3.0x38 Resolute Integrity DES), D1 70 (jailed), mild to mod nonobs LCx & RCA dzs; b. MV 7/18: Mod mid/apical antsept defect, no isch, EF 45%->Med Rx; c. 09/2017 Cath: LM nl, LAD patent stent, D1 70ost (jailed), LCX 50p, OM1 100, OM2 30, RCA 30p, RPAV 50, EF 50-55%->Med Rx.   Daily consumption of alcohol    Diverticulosis    Erectile dysfunction    GERD (gastroesophageal reflux disease)    HFrEF (heart failure with reduced ejection fraction) (HCC)    a. TTE 2/18:  EF of 30-35% (in setting of MI); b. TTE 5/18: EF 35-40%; c. 10/2017 Echo: EF 40-45%, mid-apicalantsept, ant, ap sev HK, Gr1 DD. Mild BAE.   History of Clostridium difficile colitis    HOH (hard of hearing)    Hyperlipidemia    Hypertension    Incisional hernia, without obstruction or gangrene 02/12/2023   Incomplete right bundle branch block (RBBB)    Ischemic cardiomyopathy    a. TTE 2/18:  EF of 30-35% with LAD territory AK, likely mural thrombus at the LV apex; b. TTE 5/18: EF 35-40%, mid and apical anterior/anteroseptal AK, no evidence of LV thrombus, Gr1DD, mild BAE, normal RVSF; c. 10/2017 Echo: EF 40-45%, mid-apicalantsept, ant, ap sev HK, Gr1 DD. Mild BAE.   Marijuana user    Multiple pulmonary nodules    On long term clopidogrel  therapy  Osteoarthritis    Pulmonary emphysema (HCC)    Right hydrocele    Right inguinal hernia    Splenic laceration (grade V) 04/06/2019   a.) s/p splenectomy 04/06/2019   ST elevation myocardial infarction (STEMI) of anterior wall (HCC) 03/09/2016   a.) late presenting STEMI (symptom onset ~18 hours prior to arrival) 03/09/2016 - thrombotic occlusion p/mLAD involving D1/D2 --> IVUS-guided PCI to p/mLAD (Integrity Resolute 3.0 x 38 mm DES) with 0% residual stenosis -> D2 was jailed by the LAD stent resulting in 70% ostial stenosis with TIMI-3 flow.   Umbilical hernia    Past Surgical History:  Procedure Laterality Date   COLONOSCOPY WITH PROPOFOL  N/A  04/22/2017   Procedure: COLONOSCOPY WITH PROPOFOL ;  Surgeon: Therisa Bi, MD;  Location: [facility] ENDOSCOPY;  Service: Gastroenterology;  Laterality: N/A;   COLONOSCOPY WITH PROPOFOL  N/A 07/13/2022   Procedure: COLONOSCOPY WITH PROPOFOL ;  Surgeon: Therisa Bi, MD;  Location: [facility] ENDOSCOPY;  Service: Gastroenterology;  Laterality: N/A;   CORONARY ANGIOGRAPHY N/A 03/09/2016   Procedure: Coronary Angiography;  Surgeon: Lonni Hanson, MD;  Location: [facility] INVASIVE CV LAB;  Service: Cardiovascular;  Laterality: N/A;   CORONARY STENT INTERVENTION N/A 03/09/2016   Procedure: Coronary Stent Intervention;  Surgeon: Lonni Hanson, MD;  Location: [facility] INVASIVE CV LAB;  Service: Cardiovascular;  Laterality: N/A;   HERNIA REPAIR  2005   HERNIORRHAPHY, INGUINAL, ROBOT-ASSISTED, LAPAROSCOPIC Right 03/09/2023   Procedure: XI ROBOTIC ASSISTED INGUINAL HERNIA;  Surgeon: Lane Shope, MD;  Location: [facility] ORS;  Service: General;  Laterality: Right;   HYDROCELE EXCISION     LEFT HEART CATH AND CORONARY ANGIOGRAPHY Left 09/20/2017   Procedure: LEFT HEART CATH AND CORONARY ANGIOGRAPHY;  Surgeon: Hanson Lonni, MD;  Location: [facility] INVASIVE CV LAB;  Service: Cardiovascular;  Laterality: Left;   SPLENECTOMY N/A 04/06/2019   UMBILICAL HERNIA REPAIR N/A 03/09/2023   Procedure: HERNIA REPAIR UMBILICAL ADULT;  Surgeon: Lane Shope, MD;  Location: [facility] ORS;  Service: General;  Laterality: N/A;   Family Status  Relation Name Status   Mother Glendale Geralds Deceased   Father Prentice Okey Haymaker Sr Deceased at age 4   MGM  Deceased   MGF  Deceased   PGM  Deceased   PGF  Deceased  No partnership data on file   Family History  Problem Relation Age of Onset   Emphysema Mother    Macular degeneration Mother    Heart attack Mother    COPD Mother    Heart attack Father    Heart disease Father    Social History   Socioeconomic History   Marital status: Married    Spouse name: Not on file   Number of children: 0    Years of education: Not on file   Highest education level: Bachelor's degree (e.g., BA, AB, BS)  Occupational History   Not on file  Tobacco Use   Smoking status: Former    Current packs/day: 0.00    Average packs/day: 3.0 packs/day for 17.3 years (52.0 ttl pk-yrs)    Types: Cigarettes    Start date: 07/03/2007    Quit date: 12/12/2009    Years since quitting: 14.2    Passive exposure: Past   Smokeless tobacco: Never   Tobacco comments:    QUIT IN 2005  Vaping Use   Vaping status: Never Used  Substance and Sexual Activity   Alcohol use: Yes    Alcohol/week: 9.0 standard drinks of alcohol    Types: 1 Glasses of wine, 8 Cans of beer  per week    Comment: average of 2 beers per day   Drug use: Yes    Frequency: 1.0 times per week    Types: Marijuana    Comment: smokes pot occassionally   Sexual activity: Yes    Birth control/protection: None  Other Topics Concern   Not on file  Social History Narrative   Living with family, Independent at baseline   Social Drivers of Health   Tobacco Use: Medium Risk (03/07/2024)   Patient History    Smoking Tobacco Use: Former    Smokeless Tobacco Use: Never    Passive Exposure: Past  Physicist, Medical Strain: Low Risk (12/16/2023)   Overall Financial Resource Strain (CARDIA)    Difficulty of Paying Living Expenses: Not hard at all  Food Insecurity: No Food Insecurity (12/20/2023)   Epic    Worried About Programme Researcher, Broadcasting/film/video in the Last Year: Never true    Ran Out of Food in the Last Year: Never true  Transportation Needs: No Transportation Needs (12/20/2023)   Epic    Lack of Transportation (Medical): No    Lack of Transportation (Non-Medical): No  Physical Activity: Sufficiently Active (12/20/2023)   Exercise Vital Sign    Days of Exercise per Week: 7 days    Minutes of Exercise per Session: 30 min  Stress: No Stress Concern Present (12/20/2023)   [employer] of Occupational Health - Occupational Stress Questionnaire     Feeling of Stress: Only a little  Social Connections: Socially Integrated (12/20/2023)   Social Connection and Isolation Panel    Frequency of Communication with Friends and Family: More than three times a week    Frequency of Social Gatherings with Friends and Family: Twice a week    Attends Religious Services: 1 to 4 times per year    Active Member of Clubs or Organizations: Yes    Attends Banker Meetings: More than 4 times per year    Marital Status: Married  Depression (PHQ2-9): Low Risk (03/07/2024)   Depression (PHQ2-9)    PHQ-2 Score: 1  Alcohol Screen: Low Risk (12/16/2023)   Alcohol Screen    Last Alcohol Screening Score (AUDIT): 4  Housing: Unknown (12/20/2023)   Epic    Unable to Pay for Housing in the Last Year: No    Number of Times Moved in the Last Year: Not on file    Homeless in the Last Year: No  Utilities: Not At Risk (12/20/2023)   Epic    Threatened with loss of utilities: No  Health Literacy: Adequate Health Literacy (12/20/2023)   B1300 Health Literacy    Frequency of need for help with medical instructions: Never   Show/hide medication list[1] Allergies[2]  Reviews of Systems as noted in HPI.      Objective    BP 111/71   Pulse 60   Temp 98.7 F (37.1 C) (Oral)   Wt 209 lb 11.2 oz (95.1 kg)   SpO2 94%   BMI 30.09 kg/m     Physical Exam Constitutional:      Appearance: Normal appearance.  HENT:     Head: Normocephalic and atraumatic.     Mouth/Throat:     Mouth: Mucous membranes are moist.  Eyes:     Pupils: Pupils are equal, round, and reactive to light.  Cardiovascular:     Rate and Rhythm: Normal rate and regular rhythm.     Heart sounds: Normal heart sounds.  Pulmonary:     Effort: Pulmonary  effort is normal.     Breath sounds: Normal breath sounds.  Skin:    General: Skin is warm.  Neurological:     General: No focal deficit present.     Mental Status: He is alert.     Depression Screen    03/07/2024     9:02 AM 12/20/2023   10:31 AM 03/08/2023   10:36 AM 02/07/2023   10:22 AM  PHQ 2/9 Scores  PHQ - 2 Score 0 0 0 0  PHQ- 9 Score 1 0 0  1      Data saved with a previous flowsheet row definition   No results found for any visits on 03/07/24.  Assessment & Plan      Problem List Items Addressed This Visit       Cardiovascular and Mediastinum   Essential hypertension - Primary   Relevant Orders   Lipid panel   Comprehensive metabolic panel with GFR   Heart failure with recovered ejection fraction (HFrecEF) (HCC)   Chronic, controlled. Euvolemic. Follows with cardiology. Continue losartan  and hydrochlorothiazide .      Relevant Orders   Lipid panel   Comprehensive metabolic panel with GFR   CBC with Differential/Platelet     Respiratory   Pulmonary nodules/lesions, multiple     Other   Prediabetes   Last A1c of 5.7. Discussed eating a balanced diet and incorporating movement into daily routine. Will recheck A1c.       Relevant Orders   Hemoglobin A1c   Assessment & Plan Essential hypertension Blood pressure well-controlled with current medication regimen. - Continue current antihypertensive medication - Ensure adequate hydration  Benign pulmonary nodules Stable lung nodules on imaging, likely benign. - Continue monitoring with repeat lung cancer screening in October 2026  General health maintenance Due for updated blood work. Up to date on vaccines except for shingles and tetanus. Discussed cost-effective options for obtaining vaccines at pharmacies. - Ordered blood work with fasting prior to collection - Recommended obtaining shingles and tetanus vaccines at a pharmacy    Return in about 6 months (around 09/04/2024) for Annual Physical Exam.      Isaiah DELENA Pepper, MD  [facility] [phone] (phone) [phone] (fax)     [1]  Outpatient Medications Prior to Visit  Medication Sig   methylPREDNISolone (MEDROL DOSEPAK) 4 MG TBPK  tablet Take 4 mg by mouth.   [DISCONTINUED] amoxicillin (AMOXIL) 500 MG capsule Take 500 mg by mouth 3 (three) times daily.   atorvastatin  (LIPITOR ) 80 MG tablet TAKE 1 TABLET DAILY   clopidogrel  (PLAVIX ) 75 MG tablet TAKE 1 TABLET DAILY   hydrochlorothiazide  (HYDRODIURIL ) 25 MG tablet TAKE 1 TABLET DAILY   losartan  (COZAAR ) 50 MG tablet TAKE 1 TABLET DAILY   nitroGLYCERIN  (NITROSTAT ) 0.4 MG SL tablet Place 1 tablet (0.4 mg total) under the tongue every 5 (five) minutes as needed for chest pain. Maximum of 3 doses.   RYALTRIS 665-25 MCG/ACT SUSP Place 2 sprays into both nostrils 2 (two) times daily.   Skin Protectants, Misc. (EUCERIN) cream Apply 1 application topically 3 (three) times daily as needed for dry skin.   tamsulosin  (FLOMAX ) 0.4 MG CAPS capsule TAKE 1 CAPSULE DAILY   No facility-administered medications prior to visit.  [2] No Known Allergies  "

## 2024-05-16 ENCOUNTER — Ambulatory Visit: Admitting: Internal Medicine

## 2024-09-04 ENCOUNTER — Encounter

## 2024-09-05 ENCOUNTER — Ambulatory Visit: Admitting: Urology

## 2024-12-25 ENCOUNTER — Ambulatory Visit
